# Patient Record
Sex: Male | Born: 1945 | Race: White | Hispanic: No | Marital: Married | Smoking: Current every day smoker
Health system: Southern US, Community
[De-identification: ages and names within clinical notes are randomized; demographics above are authoritative.]

## PROBLEM LIST (undated history)

## (undated) DIAGNOSIS — I714 Abdominal aortic aneurysm, without rupture, unspecified: Secondary | ICD-10-CM

## (undated) DIAGNOSIS — I639 Cerebral infarction, unspecified: Secondary | ICD-10-CM

## (undated) DIAGNOSIS — R0602 Shortness of breath: Secondary | ICD-10-CM

## (undated) DIAGNOSIS — I10 Essential (primary) hypertension: Secondary | ICD-10-CM

## (undated) DIAGNOSIS — N183 Chronic kidney disease, stage 3 unspecified: Secondary | ICD-10-CM

## (undated) DIAGNOSIS — I471 Supraventricular tachycardia, unspecified: Secondary | ICD-10-CM

## (undated) DIAGNOSIS — K5792 Diverticulitis of intestine, part unspecified, without perforation or abscess without bleeding: Secondary | ICD-10-CM

## (undated) DIAGNOSIS — J449 Chronic obstructive pulmonary disease, unspecified: Secondary | ICD-10-CM

## (undated) DIAGNOSIS — I4891 Unspecified atrial fibrillation: Secondary | ICD-10-CM

## (undated) DIAGNOSIS — I6529 Occlusion and stenosis of unspecified carotid artery: Secondary | ICD-10-CM

## (undated) HISTORY — DX: Abdominal aortic aneurysm, without rupture: I71.4

## (undated) HISTORY — DX: Occlusion and stenosis of unspecified carotid artery: I65.29

## (undated) HISTORY — DX: Essential (primary) hypertension: I10

## (undated) HISTORY — DX: Chronic kidney disease, stage 3 unspecified: N18.30

## (undated) HISTORY — DX: Abdominal aortic aneurysm, without rupture, unspecified: I71.40

## (undated) HISTORY — DX: Cerebral infarction, unspecified: I63.9

## (undated) HISTORY — PX: TESTICLE TORSION REDUCTION: SHX795

## (undated) HISTORY — DX: Unspecified atrial fibrillation: I48.91

---

## 2005-02-23 DIAGNOSIS — I779 Disorder of arteries and arterioles, unspecified: Secondary | ICD-10-CM

## 2005-02-23 DIAGNOSIS — I6529 Occlusion and stenosis of unspecified carotid artery: Secondary | ICD-10-CM

## 2005-02-23 DIAGNOSIS — I639 Cerebral infarction, unspecified: Secondary | ICD-10-CM

## 2005-02-23 HISTORY — DX: Disorder of arteries and arterioles, unspecified: I77.9

## 2005-02-23 HISTORY — PX: CAROTID ENDARTERECTOMY: SUR193

## 2005-02-23 HISTORY — DX: Occlusion and stenosis of unspecified carotid artery: I65.29

## 2005-02-23 HISTORY — DX: Cerebral infarction, unspecified: I63.9

## 2005-02-23 HISTORY — PX: CAROTID STENT: SHX1301

## 2005-05-24 ENCOUNTER — Encounter: Payer: Self-pay | Admitting: Emergency Medicine

## 2005-05-24 ENCOUNTER — Ambulatory Visit: Payer: Self-pay | Admitting: Pulmonary Disease

## 2005-05-24 ENCOUNTER — Inpatient Hospital Stay (HOSPITAL_COMMUNITY): Admission: AD | Admit: 2005-05-24 | Discharge: 2005-05-29 | Payer: Self-pay | Admitting: Pediatrics

## 2005-05-24 ENCOUNTER — Ambulatory Visit: Payer: Self-pay | Admitting: Physical Medicine & Rehabilitation

## 2005-05-24 DIAGNOSIS — I639 Cerebral infarction, unspecified: Secondary | ICD-10-CM

## 2005-05-24 HISTORY — DX: Cerebral infarction, unspecified: I63.9

## 2005-05-26 ENCOUNTER — Encounter: Payer: Self-pay | Admitting: Cardiology

## 2005-05-26 ENCOUNTER — Ambulatory Visit: Payer: Self-pay | Admitting: Cardiology

## 2005-05-29 ENCOUNTER — Inpatient Hospital Stay (HOSPITAL_COMMUNITY)
Admission: RE | Admit: 2005-05-29 | Discharge: 2005-06-03 | Payer: Self-pay | Admitting: Physical Medicine & Rehabilitation

## 2005-06-05 ENCOUNTER — Encounter (HOSPITAL_COMMUNITY)
Admission: RE | Admit: 2005-06-05 | Discharge: 2005-07-05 | Payer: Self-pay | Admitting: Physical Medicine & Rehabilitation

## 2005-07-06 ENCOUNTER — Encounter: Payer: Self-pay | Admitting: Emergency Medicine

## 2005-07-06 ENCOUNTER — Inpatient Hospital Stay (HOSPITAL_COMMUNITY): Admission: EM | Admit: 2005-07-06 | Discharge: 2005-07-09 | Payer: Self-pay | Admitting: Pediatrics

## 2005-07-06 ENCOUNTER — Ambulatory Visit: Payer: Self-pay | Admitting: Internal Medicine

## 2005-07-09 ENCOUNTER — Encounter
Admission: RE | Admit: 2005-07-09 | Discharge: 2005-10-07 | Payer: Self-pay | Admitting: Physical Medicine & Rehabilitation

## 2005-07-09 ENCOUNTER — Ambulatory Visit: Payer: Self-pay | Admitting: Physical Medicine & Rehabilitation

## 2005-07-15 ENCOUNTER — Encounter (HOSPITAL_COMMUNITY)
Admission: RE | Admit: 2005-07-15 | Discharge: 2005-08-14 | Payer: Self-pay | Admitting: Physical Medicine & Rehabilitation

## 2005-08-27 ENCOUNTER — Emergency Department (HOSPITAL_COMMUNITY): Admission: EM | Admit: 2005-08-27 | Discharge: 2005-08-27 | Payer: Self-pay | Admitting: Emergency Medicine

## 2005-11-23 ENCOUNTER — Ambulatory Visit: Payer: Self-pay | Admitting: Gastroenterology

## 2005-11-23 ENCOUNTER — Ambulatory Visit (HOSPITAL_COMMUNITY): Admission: RE | Admit: 2005-11-23 | Discharge: 2005-11-23 | Payer: Self-pay | Admitting: Gastroenterology

## 2005-11-23 ENCOUNTER — Encounter (INDEPENDENT_AMBULATORY_CARE_PROVIDER_SITE_OTHER): Payer: Self-pay | Admitting: *Deleted

## 2008-01-09 ENCOUNTER — Ambulatory Visit (HOSPITAL_COMMUNITY): Admission: RE | Admit: 2008-01-09 | Discharge: 2008-01-09 | Payer: Self-pay | Admitting: Family Medicine

## 2008-09-11 ENCOUNTER — Inpatient Hospital Stay (HOSPITAL_COMMUNITY): Admission: AD | Admit: 2008-09-11 | Discharge: 2008-09-13 | Payer: Self-pay | Admitting: Family Medicine

## 2008-09-11 ENCOUNTER — Ambulatory Visit (HOSPITAL_COMMUNITY): Admission: RE | Admit: 2008-09-11 | Discharge: 2008-09-11 | Payer: Self-pay | Admitting: Family Medicine

## 2008-10-10 ENCOUNTER — Ambulatory Visit: Payer: Self-pay | Admitting: Vascular Surgery

## 2009-04-03 ENCOUNTER — Ambulatory Visit: Payer: Self-pay | Admitting: Vascular Surgery

## 2009-09-18 ENCOUNTER — Ambulatory Visit: Payer: Self-pay | Admitting: Vascular Surgery

## 2010-03-20 ENCOUNTER — Ambulatory Visit
Admission: RE | Admit: 2010-03-20 | Discharge: 2010-03-20 | Payer: Self-pay | Source: Home / Self Care | Attending: Vascular Surgery | Admitting: Vascular Surgery

## 2010-03-21 NOTE — Procedures (Unsigned)
DUPLEX ULTRASOUND OF ABDOMINAL AORTA  INDICATION:  Follow up known AAA.  HISTORY: Diabetes:  No. Cardiac:  No. Hypertension:  No. Smoking:  Yes. Connective Tissue Disorder: Family History:  Unknown. Previous Surgery:  No.  DUPLEX EXAM:         AP (cm)                   TRANSVERSE (cm) Proximal             2.73 cm                   2.90 cm Mid                  3.84 cm                   3.99 cm Distal               4.30 cm                   4.51 cm Right Iliac          0.95 cm                   1.01 cm Left Iliac           1.05 cm                   1.12 cm  PREVIOUS:  Date: 09/18/09  AP:  4.4  TRANSVERSE:  4.6  IMPRESSION: 1. Abdominal aortic aneurysm noted with largest measurement of 4.30 X     4.51 cm. 2. Somewhat limited visualization due to bowel gas and patient body     habitus. 3. Study appears stable when compared with previous examination.  ___________________________________________ Janetta Hora Fields, MD  EM/MEDQ  D:  03/20/2010  T:  03/20/2010  Job:  [id number]

## 2010-06-01 LAB — DIFFERENTIAL
Basophils Absolute: 0 10*3/uL (ref 0.0–0.1)
Basophils Relative: 0 % (ref 0–1)
Eosinophils Absolute: 0.2 10*3/uL (ref 0.0–0.7)
Eosinophils Relative: 1 % (ref 0–5)
Lymphocytes Relative: 14 % (ref 12–46)
Lymphs Abs: 1.7 10*3/uL (ref 0.7–4.0)
Monocytes Absolute: 1 10*3/uL (ref 0.1–1.0)
Monocytes Relative: 8 % (ref 3–12)
Neutro Abs: 9.3 10*3/uL — ABNORMAL HIGH (ref 1.7–7.7)
Neutrophils Relative %: 76 % (ref 43–77)

## 2010-06-01 LAB — CBC
HCT: 45.9 % (ref 39.0–52.0)
Hemoglobin: 16.1 g/dL (ref 13.0–17.0)
MCHC: 35 g/dL (ref 30.0–36.0)
MCV: 95.9 fL (ref 78.0–100.0)
Platelets: 307 10*3/uL (ref 150–400)
RBC: 4.79 MIL/uL (ref 4.22–5.81)
RDW: 12.9 % (ref 11.5–15.5)
WBC: 12.3 10*3/uL — ABNORMAL HIGH (ref 4.0–10.5)

## 2010-06-01 LAB — BASIC METABOLIC PANEL
BUN: 11 mg/dL (ref 6–23)
CO2: 25 mEq/L (ref 19–32)
Calcium: 8.7 mg/dL (ref 8.4–10.5)
Chloride: 105 mEq/L (ref 96–112)
Creatinine, Ser: 0.97 mg/dL (ref 0.4–1.5)
GFR calc Af Amer: >60 mL/min (ref >60)
GFR calc non Af Amer: >60 mL/min (ref >60)
Glucose, Bld: 117 mg/dL — ABNORMAL HIGH (ref 70–99)
Potassium: 3.7 mEq/L (ref 3.5–5.1)
Sodium: 137 mEq/L (ref 135–145)

## 2010-07-08 NOTE — Assessment & Plan Note (Signed)
OFFICE VISIT   Blake Cherry  DOB:  March 29, 1945                                       10/10/2008  RUEAV#:[id number]   Blake Cherry is a 65 year old male referred by Dr. Gerda Cherry for  evaluation of abdominal aortic aneurysm.  The patient recently had a  bout of left-sided colon diverticulitis and on CT scan was incidentally  found to have an abdominal aortic aneurysm.  This was approximately 4 cm  in diameter.  He recovered from his diverticulitis with antibiotic  therapy alone.  He currently denies any abdominal or back pain.  He  denies any family history of aneurysms.  His atherosclerotic risk  factors include smoking.  He denies history of diabetes, hypertension,  coronary artery disease, or elevated cholesterol.  He did have a stroke  in 2007 and had a left internal carotid artery stent placed at that  time.  He does have some mild residual left-sided weakness.  He also has  some dyscoordination of the left hand and leg.   PAST SURGICAL HISTORY:  None.   MEDICATIONS:  Aspirin 325 mg once a day, proglitazone versus placebo  high-risk drug trial 45 mg once a day, Centrum vitamin once a day, Fish  Oil 1000 mg 2 times a day.   ALLERGIES:  He has no known drug allergies.   FAMILY HISTORY:  Unremarkable.   SOCIAL HISTORY:  He is married and has 1 child.  Works as a Health and safety inspector.  He currently smokes approximately 1/2-pack  of cigarettes per day.  He does not consume alcohol regularly.   REVIEW OF SYSTEMS:  He is 5 feet 8, 195 pounds.  CARDIAC:  He has some dyspnea with exertion.  GI:  He still has some occasional diarrhea and constipation.  VASCULAR:  Stroke history as mentioned above.  PULMONARY, RENAL, NEUROLOGIC, ORTHOPEDIC, PSYCHIATRIC, ENT, and  HEMATOLOGIC review of systems are otherwise negative.   PHYSICAL EXAM:  Blood pressure is 150/85 in the left arm, pulse is 96  and regular.  HEENT is unremarkable.  Neck has 2+  carotid pulses without  bruit.  Chest:  Clear to auscultation.  Cardiac exam is regular rate  rhythm without murmur.  Abdomen is obese, soft, nontender, nondistended.  No masses.  Extremities:  He has 2+ radial, femoral, and popliteal  pulses bilaterally.  He has a 2+ left dorsalis pedis pulse.  He has  absent pedal pulses in the right foot.  He has no edema in the legs.  Feet are pink, warm, and well perfused.  Neurologic exam shows that the  left upper extremity is slightly weaker than the right.  He also has  some decreased sensation in the left upper extremity.   I reviewed his CT scan of the abdomen and pelvis from [facility] dated September 11, 2008.  This shows an infrarenal 4-cm abdominal  aortic aneurysm with no evidence of rupture.  His left and right common  iliac arteries are slightly ectatic but normal in size.   I had a lengthy discussion with Blake Cherry today regarding smoking  cessation.  I also described to him several techniques for considering  quitting and the benefits to this.  Greater than 5 minutes were spent on  this today.  I also had a lengthy discussion with him today on  the  anatomic findings of his abdominal aortic aneurysm as well as the long-  term history of small aortic aneurysms and risk of rupture of aneurysms  as they grow in size.  I believe the best option for right now is  continued surveillance until the aneurysm reaches around 5.5 cm in  diameter.  We will schedule him for a repeat ultrasound in 6 months'  time.  He will return for follow up sooner if he has any symptoms of  back or abdominal pain.  The risk of rupture at his current size is less  than 1% per year.  This was explained to the patient.   Blake Hora. Fields, MD  Electronically Signed   CEF/MEDQ  D:  10/11/2008  T:  10/11/2008  Job:  [id number]   cc:   Blake Picket A. Gerda Diss, MD

## 2010-07-08 NOTE — Discharge Summary (Signed)
NAME:  Blake Cherry, Blake Cherry NO.:  [id number]   MEDICAL RECORD NO.:  [id number]          PATIENT TYPE:  INP   LOCATION:  A310                          FACILITY:  [facility]   PHYSICIAN:  Donna Bernard, M.D.DATE OF BIRTH:  16-Apr-1945   DATE OF ADMISSION:  09/11/2008  DATE OF DISCHARGE:  07/22/2010LH                               DISCHARGE SUMMARY   FINAL DIAGNOSES:  1. Acute diverticulitis.  2. Aortic aneurysm, 4.1 cm.   FINAL DISPOSITION:  1. The patient discharged to home.  2. Low-residue, full liquid diet, advancing to solid over the next      several days.  3. Cipro 500 b.i.d. for 7 days.  4. Flagyl 500 t.i.d. for 7 days.  5. MiraLax one scoop daily, long term as needed for constipation.  6. Follow up in the office next Wednesday.  7. We will work on setting up consultation with vascular surgeons.   INITIAL H AND P:  Please see H and P as dictated.   HOSPITAL COURSE:  This patient is a 65 year old white male with a fairly  benign prior medical history, who presented the day of admission with  severe pain, left lower quadrant tenderness, and had elevated white  blood count.  CT scan revealed evidence of diverticulitis.  The patient  was admitted to hospital, started on appropriate IV antibiotics and IV  fluids.  He was also given Dilaudid for pain.  Zofran as needed for  nausea.  Over next 48 hours, he improved markedly.  Today, day of  discharge, he has good bowel sounds and only mild tenderness in his  abdomen.  Of note, during the scan of his abdomen, an abdominal aneurysm  was discovered at 4.1 cm, this was felt to be asymptomatic.  At this  time, we are going to set up a vascular surgeon consult in this regard.  Today, the patient is stable and discharged home with diagnosis and  disposition as noted above.      Donna Bernard, M.D.  Electronically Signed     WSL/MEDQ  D:  09/13/2008  T:  09/13/2008  Job:  [id number]

## 2010-07-08 NOTE — H&P (Signed)
NAME:  Blake Cherry, Blake Cherry NO.:  [id number]   MEDICAL RECORD NO.:  [id number]          PATIENT TYPE:  INP   LOCATION:  A310                          FACILITY:  [facility]   PHYSICIAN:  Scott A. Gerda Diss, MD    DATE OF BIRTH:  03-24-1945   DATE OF ADMISSION:  09/11/2008  DATE OF DISCHARGE:  LH                              HISTORY & PHYSICAL   CHIEF COMPLAINT:  Abdominal pain and discomfort.   HISTORY OF PRESENT ILLNESS:  This 65 year old gentleman presents with  abdominal pain and discomfort for the past 4 to 5 days.  It has been  progressively worse.  He states that he has found it very difficult to  have a bowel movement.  He feels like he needs to go, but he cannot.  He  notices his abdomen is swelling more and he is having pain.  He did have  a little bit of fever over the weekend and some chills.  Denies any  diarrhea.  Relates some nausea.  On examination in the office, he had  tenderness, so we sent him for a CAT scan and lab work.   PAST MEDICAL HISTORY:  He had a stroke in 2007.  He has been counseled  to quit smoking, but he keeps smoking.  He had a colonoscopy in 2007  that did show some diverticula, and he does have a family history of  hypertension, diabetes.   MEDICATIONS:  Aspirin.  Multivitamin.  Fish oil.   ALLERGIES:  None.   REVIEW OF SYSTEMS:  Negative for headaches, cough, shortness of breath,  chest pressure or pain, hematuria, or rectal bleeding.   EXAM:  HEENT:  TMs and LT.  NECK:  No mass.  CHEST:  CTA.  No crackles.  HEART:  Regular.  ABDOMEN:  Soft with mild bloating.  Tenderness in the left lower  quadrant with no true guarding.  EXTREMITIES:  No edema.   DIAGNOSTIC STUDIES:  CBC with 14,000 white count, left shift.  MET7  overall looks good.  CT scan shows diverticulitis.  No abscess.  Also  shows infrarenal aortic aneurysm of 4 cm which I talked with him about.   ASSESSMENT AND PLAN:  1. Aneurysm.  This is not the source of his pain.   It is not leaking.      I have told him we will set him up with vascular surgery in      [facility] for a consultation after he gets out of the hospital.  2. Diverticulitis.  Because of elevated white count, inability to have      bowel movement, increased gas, and nausea, I do feel this patient      needs to be an inpatient.  We will go ahead and treat him as an      inpatient.  IV Levaquin along with Flagyl, monitor blood counts,      and when his white count comes down as well as his pain starts      improving and his bowels start moving better, then he will probably      be able  to go home.  He will probably be in the hospital anywhere      from 2 to 4 days.  Certainly if it gets worse, we may have to do      surgical consultation, but currently not necessary.      Scott A. Gerda Diss, MD  Electronically Signed     SAL/MEDQ  D:  09/11/2008  T:  09/11/2008  Job:  [id number]

## 2010-07-08 NOTE — Procedures (Signed)
DUPLEX ULTRASOUND OF ABDOMINAL AORTA   INDICATION:  Followup known abdominal aortic aneurysm.   HISTORY:  Diabetes:  No.  Cardiac:  No.  Hypertension:  No.  Smoking:  Yes.  Connective Tissue Disorder:  Family History:  Unknown.  Previous Surgery:  No.   DUPLEX EXAM:         AP (cm)                   TRANSVERSE (cm)  Proximal             2.3 cm                    2.1 cm  Mid                  2.8 cm                    2.6 cm  Distal               4.4 cm                    4.6 cm  Right Iliac          Not visualized            Not visualized  Left Iliac           cm                        1.6 cm   PREVIOUS:  Date:  AP:  4.2  TRANSVERSE:  4.6   IMPRESSION:  1. Abdominal aortic aneurysm with the largest diameter of 4.4 x 4.6      cm.  2. Doppler velocities in the aorta and left common iliac artery appear      to be normal.  3. Right common iliac artery not visualized.  4. Somewhat limited visualization due to patient body habitus and      bowel gas patterns.  5. No significant change from previous exam.   ___________________________________________  Janetta Hora. Fields, MD   NT/MEDQ  D:  09/18/2009  T:  09/18/2009  Job:  [id number]

## 2010-07-08 NOTE — Procedures (Signed)
DUPLEX ULTRASOUND OF ABDOMINAL AORTA   INDICATION:  Followup of abdominal aortic aneurysm.   HISTORY:  Diabetes:  No.  Cardiac:  No.  Hypertension:  No.  Smoking:  Yes.  Connective Tissue Disorder:  Family History:  Unknown.  Previous Surgery:  No.   DUPLEX EXAM:         AP (cm)                   TRANSVERSE (cm)  Proximal             2.42 cm                   2.15 cm  Mid                  4.2 cm                    4.67 cm  Distal               4.29 cm                   3.93 cm  Right Iliac          1.37 cm                   2.12 cm  Left Iliac           1.65 cm                   2.12 cm   PREVIOUS:  Date:  10/12/2008  AP:  4  TRANSVERSE:   IMPRESSION:  1. Abdominal aortic aneurysm with largest measurement 4.2 x 4.67 cm.  2. Poor visualization due to body habitus and bowel gas pattern.   ___________________________________________  Janetta Hora. Fields, MD   CJ/MEDQ  D:  04/03/2009  T:  04/03/2009  Job:  [id number]

## 2010-07-11 NOTE — H&P (Signed)
NAME:  Blake Cherry, Blake Cherry NO.:  [id number]   MEDICAL RECORD NO.:  [id number]          PATIENT TYPE:  INP   LOCATION:  3005                         FACILITY:  [facility]   PHYSICIAN:  Deanna Artis. Hickling, M.D.DATE OF BIRTH:  02/18/1946   DATE OF ADMISSION:  07/06/2005  DATE OF DISCHARGE:                                HISTORY & PHYSICAL   CHIEF COMPLAINT:  Vertigo.   HISTORY OF PRESENT ILLNESS:  A 65 year old left handed married Caucasian  gentleman.  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  This occurred from an  occluded right internal carotid artery that was revascularized.   The patient has done extremely well nearly returning to baseline.  This  morning, the patient had sudden onset of vertigo clockwise in rotation with  vomiting.  He did not fall.  This lasted for about 4 hours and then  disappeared.  The patient was seen at [facility].  CT scan of the  brain showed the remote infarction in the right anterior cerebral artery  distribution. MRI, however, showed a new infarction lateral and immediately  adjacent to the prior stroke.  There was evidence of a petechial hemorrhage  throughout the old stroke.  There was no evidence of brain stem infarction.  The vessel signal void in the circle of Willis looked to be fine.  The  patient was transferred from [facility] for further evaluation of  what appeared to be a new stroke.   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  There appeared to be embolized clot in the superior and inferior branches of  the middle cerebral artery, also the angular artery and the posterior  parietal arteries.  None of these areas actually turned out to have a stroke  in them.   The patient had a stent placed at the carotid and had evidence of  hemorrhagic transformation seen on CT scan as well as a small amount of  subarachnoid hemorrhage.  2-D echocardiogram was negative for clot and  showed normal ejection fraction.  Transcranial and carotid Dopplers were not  done.  Serum homocysteine was elevated at 17.  Hemoglobin A1c was normal.  Cholesterol and triglycerides were normal.  HDL low at 25, LDL low but  normal at 65.  The patient was sent home on aspirin, Plavix was added due to  his stent.  He also was given Protonix, Colace and Nicoderm patch for  smoking cessation.  The patient had a mild residual left hemiparesis 4/5,  decreased fine motor movements, decreased foot tap and a left central  seventh on discharge.  This has all improved.  The patient was in rehab and  was working on balancing on his left leg and also tandem gait.   PAST MEDICAL HISTORY:  The patient had hypertension during his initial  stroke event.  This has resolved.  The patient had never sought care  from  medical doctor prior to this hospitalization.   PAST SURGICAL HISTORY:  None.   FAMILY HISTORY:  Father had died of a stroke at age 2.  Mother died of a  stroke at age 4.  The patient has two brothers with diabetes mellitus and  kidney disease.  Sister with heart disease.   SOCIAL HISTORY:  The patient quit smoking April;  two pack per day smoker.  He does not use alcohol.  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  He is married and has one son.   EXAMINATION:  Temperature 98, pulse 89, respirations 20, blood pressure  154/85.  His weight is estimated at 195 pounds.  Oxygen saturation 94% room  air.  HEENT:  No signs of infection.  No bruits.  LUNGS:  Clear.  HEART:  No murmurs.  PULSES:  Normal.  ABDOMEN:  Soft.  Bowel sounds normal.  No hepatosplenomegaly.  EXTREMITIES:  Negative.  NEUROLOGIC:  Examination showed mental status  alert without dysphasia.  Cranial nerves:  Round, reactive pupils.  Visual fields full.  Extraocular  movements full.  Symmetric facial strength.  Midline tongue.  Air conduction  greater than bone conduction bilaterally.  Motor examination:  Normal  strength.  No drift.  Fine motor movements, right better than left.  Left is  a little clumsy.  He does not show satellite behavior with rotating his  hands about each other.  He taps his toe better on the right than left.  Sensation:  No hemisensory.  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  Cerebellar:  Finger-to-nose, heel-knee-shin was okay.  Gait:  Broad based.  He had difficulty with tandem.  Deep tendon reflexes were diminished.  The  patient had bilateral flexor plantar responses.   IMPRESSION:  Recurrent anterior cerebral artery infarction.  The vertigo is  seemingly not related but I wonder if the patient did not have an embolic  event that went both to the interior cerebral artery distribution and the  posterior circulation.   We see no evidence of a brain stem infarction.   At present, I plan no change in medications including his Nicoderm.  We will  check carotid Doppler, transcranial Doppler, MRA.  MRI was already done and  was reported upon.  I have reviewed it as well as the CT.  We will recheck  the homocysteine because it was the only one of the medicines that was  increased.  The patient is able to go home when the workup is complete.      Deanna Artis. Sharene Skeans, M.D.  Electronically Signed     WHH/MEDQ  D:  07/06/2005  T:  07/07/2005  Job:  [id number]   cc:   Lorin Picket A. Gerda Diss, MD  Fax: [phone]

## 2010-07-11 NOTE — Discharge Summary (Signed)
NAME:  Blake Cherry, Blake Cherry NO.:  [id number]   MEDICAL RECORD NO.:  [id number]          PATIENT TYPE:  INP   LOCATION:  3006                         FACILITY:  [facility]   PHYSICIAN:  Pramod P. Pearlean Brownie, MD    DATE OF BIRTH:  [birth year]   DATE OF ADMISSION:  05/24/2005  DATE OF DISCHARGE:  05/29/2005                                 DISCHARGE SUMMARY   DISCHARGE DIAGNOSES:  1.  Right anterior communicating artery infarct, status post two thirds      intravenous TPA stroke secondary to embolization from right internal      carotid artery occlusion, status post right internal carotid artery      angioplasty and stent.  2.  Hypertension.  3.  Tobacco abuse.  4.  Hyperhomocysteinemia.  5.  Pulmonary nodules per chest x-ray.  No pulmonary nodules on chest CT,      some__________  adenopathy, needs followup in four to six months.   DISCHARGE MEDICATIONS:  1.  Nicotine patch 21 mg daily.  2.  Aspirin 81 mg a day.  3.  Plavix 75 mg a day.  4.  Protonix 40 mg a day.  5.  Colace 100 mg b.i.d.   STUDIES PERFORMED:  1.  Chest x-ray shows cardiomegaly without edema or infiltrate, a 5 x 7      right mid lung nodule, not definitely calcified.  2.  CT of the chest shows borderline enlarged__________  lymph node.  No      suspicious pulmonary nodules or masses.  Dependent changes at the right      lung base.  3.  CT of the head, on admission, shows no acute abnormality, sphenoid and      ethmoid sinusitis.  4.  CT of the head, status post angioplasty, shows no interval change in CT      findings.  No new intracranial hemorrhage, chronic sinusitis.  5.  CT of the head, later that day again, shows no new changes.  6.  Cerebral angiogram shows occluded right internal carotid artery at the      bulb with partial distal reconstitution of the cavernous and__________      cavernous regions from the external carotid artery branches.  Filling      defects in the superior and inferior division  of the right middle      cerebral artery representing embolized clot with angiographic occlusion      of the callosal margin of the pericallosal margin on the right side.  7.  Revascularization with stent-assisted angioplasty of symptomatic right      internal carotid artery occlusion improved flow and clearance of filling      defects in the proximal branches of the anterior and middle cerebral      arteries, residual obstruction of the callosal marginal branch at its      middle third and also the angular branch of the right MCA and the      posterior parietal branch of the right MCA.  8.  Chest x-ray, after angioplasty, showed support tubes and intubation.  Again nodular opacity overlying the right lower lung was followed up as      above.  9.  CT of the head, on May 25, 2005, shows evolving medial right frontal      lobe infarct, new subarachnoid hemorrhage within the right fontal lobe,      stable scattered white matter hyper densities within the right frontal      lobe and right parietal lobe.  10. CT of the head, on May 26, 2005, shows stable right frontal infarct      with right frontal subarachnoid hemorrhage.  No new changes.  11. CT of the head, on May 27, 2005, shows evolutionary changes of right      frontal infarct with stable right frontal hemorrhage, no new changes.  12. A 2D echocardiogram shows overall left ventricular systolic function was      normal.  Inadequate to evaluate left ventricular regional wall motion.      No embolic source.  13. EKG shows normal sinus rhythm, no old tracings to compare.  14. Carotid Doppler study, not performed.  15. Transcranial Doppler, not performed.   LABORATORY STUDIES:  CBC in emergency room showed white blood cells of 12.1,  otherwise CBC normal.  Differential normal.  Chemistry with glucose 118,  calcium 7.6, total protein 5.3, albumin 2.9.  The rest of the chemistry was  normal.  Liver function tests were normal.   Hemoglobin A1c was 5.4.  Homocystine was elevated at 17.0.  Cholesterol was 116, triglycerides 130,  HDL 25, and LDL 65.  Urinalysis shows 0-2 white blood cells, 3-6 red blood  cells, and no protein, and rare bacteria.   HISTORY OF PRESENT ILLNESS:  [name] is a 65 year old, left  handed, Caucasian male who had sudden onset of left sided clumsiness around  1700 the day of admission.  He arrived at [facility] emergency room  and a call was placed to Dr. Sharene Skeans at [facility] around 1745.  IV TPA was started at [facility] at 1827.  The patient received 8.6  loading dose and approximately 1/2 the 77.3 mg infusion. The infusion was  stopped in the setting of increased right pupil size and increased left  sided weakness.  The patient was transported STAT to [facility] Neuro ICU.  The patient was sent to the angiogram suite where he was seen by Dr. Kerby Nora, where he was found to have a proximal right ICA occlusion that  was completely opened with angioplasty and stent placement.  There was an M2  filling deficit not seen proximally with likely clot that dissolved.  He had  an occluded angular branch noted.  He was sent to the neuro ICU for further  care intubated.   HOSPITAL COURSE:  MRI showed a right ACA infarct that was thought to be  secondary to embolization from right ICA occlusion.  Critical care medicine  was consulted for vent management.  Chest x-ray did show a pulmonary nodule  with followup CT not showing pulmonary nodule but did find a enlarged lymph  node.  The patient was advised to stop smoking and plans were for followup  CT of the chest in four to six months.  Otherwise the patient was able to  wean successfully.  Post CT after angiogram showed punctate hemorrhage and  subarachnoid hemorrhage that were present but not severe.  The patient was  placed on aspirin and Plavix for secondary stroke prevention and secondary  to stent  placement.  The patient was carefully monitored over the next three  to four days with daily CT scans of the head which remained stable.  PT, OT,  and speech therapy evaluated the him and thought he would benefit from  comprehensive inpatient rehab.  Insurance approval was obtained and the  patient was transferred there for continued care.   CONDITION ON DISCHARGE:  The patient is alert and oriented x3.  No cranial  nerve palsies.  Left hemiparesis 4/5 with decreased fine motor movement on  the left and decreased foot tap on the left.  His extraocular movements were  intact.  He did have some left facial weakness.  He had no aphasia or  dysarthria.   DISCHARGE PLAN:  1.  Transfer to rehab for continuation of PT, OT, and speech therapy.  2.  Aspirin and Plavix for secondary stroke prevention, likely consider      stopping one of these medicines in two months.  3.  The patient advised to stop smoking.  4.  New Foltx for elevated homocystine and need to continue lifelong unless      the patient stops smoking and then needs re-evaluated.  5.  Followup CT of the chest in four to six months with pulmonary critical      care medicine.  6.  Follow up with Dr. Lajuana Matte in two to three months.  7.  Follow up with primary care physician one moth after discharge.      Annie Main, N.P.    ______________________________  Sunny Schlein. Pearlean Brownie, MD    SB/MEDQ  D:  05/29/2005  T:  05/30/2005  Job:  [id number]   cc:   Pramod P. Pearlean Brownie, MD  Fax: [phone]   Scott A. Gerda Diss, MD  Fax: [phone]

## 2010-07-11 NOTE — Discharge Summary (Signed)
NAME:  Blake Cherry, KUHL NO.:  [id number]   MEDICAL RECORD NO.:  [id number]          PATIENT TYPE:  INP   LOCATION:  3005                         FACILITY:  [facility]   PHYSICIAN:  Pramod P. Pearlean Brownie, MD    DATE OF BIRTH:  [birth year]   DATE OF ADMISSION:  07/07/2005  DATE OF DISCHARGE:  07/09/2005                                 DISCHARGE SUMMARY   DIAGNOSES AT TIME OF DISCHARGE:  1.  Brainstem transient ischemic attack with extension of recent right      middle cerebral artery infarction.  Right anterior cerebral artery      infarction with petechial hemorrhagic transformation on May 29, 2005.  2.  Hypertension, resolved after April 2000 hospitalization.  3.  Tachycardia, etiology unclear, now resolved.  4.  Smoker.   MEDICINES AT THE TIME OF DISCHARGE:  1.  Plavix 75 mg daily.  2.  Aspirin 325 mg daily.  3.  Vitamin B 650 mg daily.  4.  Folic acid 1 mg daily.  5.  Nicotine patch 14 mg daily.   STUDIES PERFORMED:  1.  CT of the head on admission showed old ischemic right frontal infarction      with expected evolutionary changes.  No acute changes.  2.  MRI of the brain shows a relatively receive new right anterior cerebral      artery ischemia lateral to the area of subacute infarction, treated with      endovascular technique on May 24, 2005.  Minimal T1 shortening on      sagittal images could suggest mild micro-hemorrhage, atrophy with      chronic microvascular ischemic change.  3.  MRA of the head shows right internal carotid artery __________.  The      cavernous and supraclinoid segments are normal.  Mild stenosis in the      right M1 segment.  MCA bifurcation branches are patent.  Mild narrowing,      left M1 segment.  Suggestion of occlusion of the right pericallosal      branch of the anterior cerebral artery.  Probable moderate      arteriosclerotic narrowing of both MCAs proximally.  4.  TEE performed on Jul 08, 2005 with no clot, no PSO.  5.   Carotid Doppler shows no ICA stenosis.  6.  Electrocardiogram shows sinus tachycardia.  No ischemic changes.   LABORATORY STUDIES:  Hemoglobin 17.1, hematocrit 48.3, white blood cells  13.4, platelets 283, MCV 93.4, neutrophils 92.  Cardiac enzymes negative.  Chemistry with glucose of 117; otherwise normal.  Urinalysis with 0-2 red  blood cells; otherwise normal.  Homocystine 12.1.  Coagulation studies on  admission normal.   HISTORY OF PRESENT ILLNESS:  [name] is a 65 year old left-handed  Caucasian male with a stroke admission May 24, 2005 through May 29, 2005,  followed by rehabilitation May 29, 2005 through June 02, 2005 for a right  anterior cerebral distribution infarction with petechial hemorrhagic  transformation.  This occurred from an occluded right internal carotid  artery that was revascularized.  The patient has done well since  discharge,  nearly returning to baseline.  The morning of admission, he had the sudden  onset of, clockwise in rotation, with vomiting.  He did not fall.  It lasted  for about 4 hours then disappeared.  He was seen at [facility] where CT of  the brain showed the remote infarction in the right anterior cerebral artery  distribution.  MRI, however, did show a new infarction, and immediately  adjacent to the prior stroke.  There was evidence of petechial hemorrhage  throughout the old stroke.  There was no evidence of brainstem infarction.  The vessel signal void and the circle of Willis looked to be fine.  The  patient was transferred from [facility] for further evaluation of what  appeared to be a new stroke.   HOSPITAL COURSE:  A cardioembolic source was suspected, and a  transesophageal echocardiogram was ordered, as it was not done in his prior  admission.  The TEE was negative for cardioembolic source with no PSO and  negative bubble study.  It is felt he probably had a brainstem TIA in the  setting of a small acute infarction in  the adjacent stroke, likely due to  alteration in collaterals for surrounding the current stroke.  Recommendations were to keep the patient on aspirin and Plavix secondary to  recent stent and to keep him well hydrated.  He will follow up with Dr.  Pearlean Brownie in the office for a bubble study and emboli monitoring.  He is stable  for discharge home.  Of note, the patient did have an elevated heart rate in  the hospital when heart rate in the 130s to 140s with maintenance fair.  It  was thought he may be anxiety secondary to the procedure, and Ativan was  given.  He did not have immediate decrease in his heart rate, so follow up  cardiac labs and electrocardiogram were performed, which were both  unremarkable.  His heart rate did return to baseline prior to discharge.  The etiology of the tachycardia was unclear.   CONDITION AT DISCHARGE:  Alert and oriented x3.  Speech clear.  No aphasia.  His visual fields are full.  His face is symmetric.  He had no arm drift.  His finger-nose-finger was okay bilaterally.  His finger tap was equal.  He  can raise both legs up off the bed without difficulty.  His chest was clear  to auscultation.  His heart rate was regular.  His gait was steady.   DISCHARGE PLAN:  1.  Discharge to home.  2.  Aspirin and Plavix for secondary stroke prevention secondary to stent in      April of 2007.   FOLLOW UP:  1.  Follow up with primary care physician within 1 month after discharge.  2.  Follow up with Dr. Pearlean Brownie on July 27, 2005 at 9:30 a.m.      Annie Main, N.P.    ______________________________  Sunny Schlein. Pearlean Brownie, MD    SB/MEDQ  D:  07/09/2005  T:  07/10/2005  Job:  [id number]   cc:   Blake Picket A. Gerda Diss, MD  Fax: [phone]

## 2010-07-11 NOTE — Discharge Summary (Signed)
NAME:  Blake Cherry, Blake Cherry NO.:  [id number]   MEDICAL RECORD NO.:  [id number]          PATIENT TYPE:  IPS   LOCATION:  4025                         FACILITY:  [facility]   PHYSICIAN:  Ellwood Dense, M.D.   DATE OF BIRTH:  1945/02/24   DATE OF ADMISSION:  05/29/2005  DATE OF DISCHARGE:  06/02/2005                                 DISCHARGE SUMMARY   DISCHARGE DIAGNOSES:  1.  Right ACA infarction, small right frontal cortical subarachnoid      hemorrhage with left hemiplegia.  2.  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  3.  Tobacco abuse.   HISTORY OF PRESENT ILLNESS:  This is a 65 year old right-handed white male  admitted April 1 with acute onset of left-sided weakness.  Cranial CT scan  with right ACA infarction.  CT of the chest negative.  Angiogram with right  ICA occlusion status post stent placed April 1 per Dr. Corliss Skains.  Neurology  service Dr. Sharene Skeans.  Patient did receive TPA.  Placed on aspirin, Plavix  therapy for cerebrovascular accident prophylaxis.  Echocardiogram with  normal left ventricular function.  Patient was admitted for a comprehensive  rehabilitation program.   PAST MEDICAL HISTORY:  See discharge diagnoses.  Noted history of tobacco  abuse.  No alcohol.   ALLERGIES:  None.   SOCIAL HISTORY:  Married.  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  Wife and local daughter work day shift.   MEDICATIONS PRIOR TO ADMISSION:  None.   REHABILITATION HOSPITAL COURSE:  Patient was admitted to inpatient  rehabilitation services with therapies initiated on a b.i.d. basis  consisting of physical therapy, occupational therapy, and speech therapy as  well as rehabilitation nursing.  The following issues were addressed during  patient's rehabilitation stay.  Pertaining to Mr. Plagge right ACA  infarction remained medically stable.  He had also undergone stenting April  1 per Dr. Corliss Skains for  findings of right ICA occlusion.  He was essentially  independent with his ambulation.  He was needing some mild handheld  assistance for his balance.  He was a minimal assist to supervision for  attention deficits.  This was worse with functional tasks.  He was modified  independent for activities of daily living.  He remained on aspirin and  Plavix therapy for CVA prophylaxis.  His blood pressures were well  controlled without the use of antihypertensive medications.  He was placed  on a tapering dose of a Nicoderm patch and it was discussed at length full  education on the need for cessation of smoking.  It was questionable if he  would be compliant with these requests.   Latest laboratories showed a sodium 137, potassium 4, BUN 18, creatinine  1.1.  Hemoglobin 15.4, hematocrit 43.9, WBC 9.3, platelets 297,000.  Hemoglobin A1c of 5.4.  Cholesterol 166.   DISCHARGE MEDICATIONS:  1.  Ecotrin 325 mg daily.  2.  Plavix 75 mg daily.  3.  Folic acid 1 mg daily.  4.  Vitamin B6 50 mg daily.  5.  Nicoderm  patch 14 mg daily x3 weeks, then 7 mg x3 weeks, then stop.   He would follow up with Dr. Ellwood Dense at the outpatient rehabilitation  service office as advised, Dr. Pearlean Brownie, neurology services as well as Dr.  Lilyan Punt of [place], medical management.  Outpatient therapies had  been arranged as recommendations per rehabilitation services.      Mariam Dollar, P.A.    ______________________________  Ellwood Dense, M.D.    DA/MEDQ  D:  06/02/2005  T:  06/02/2005  Job:  [id number]   cc:   Deanna Artis. Sharene Skeans, M.D.  Fax: [phone]   Grandville Silos. Corliss Skains, M.D.  Fax: [phone]   Scott A. Gerda Diss, MD  Fax: [phone]

## 2010-07-11 NOTE — H&P (Signed)
NAME:  Blake Cherry, Blake Cherry NO.:  [id number]   MEDICAL RECORD NO.:  [id number]          PATIENT TYPE:  INP   LOCATION:  3006                         FACILITY:  [facility]   PHYSICIAN:  Ellwood Dense, M.D.   DATE OF BIRTH:  1945-07-19   DATE OF ADMISSION:  05/24/2005  DATE OF DISCHARGE:                                HISTORY & PHYSICAL   PRIMARY CARE PHYSICIANS:  Primary care physician is Scott A. Gerda Diss, M.D. in  [place], [place].  Neurologist is Deanna Artis. Sharene Skeans, M.D.,  interventional radiologist is Grandville Silos. Corliss Skains, M.D.   ADMITTING PHYSICIAN:  Ellwood Dense, M.D.   HISTORY OF PRESENT ILLNESS:  [name] is a 65 year old right handed adult  male with an unremarkable past medical history.  He was admitted May 24, 2005 with acute onset of left sided weakness.  His initial cranial CT scan  showed a right anterior cerebral artery infarct with small right frontal  cortical subarachnoid hemorrhage.  A CT scan of the chest was negative.  The  patient underwent angiogram of the internal carotid arteries which showed  right internal carotid artery occlusion and he underwent a stent May 24, 2005 by Dr. Julieanne Cotton. Neurology, specifically Dr. Sharene Skeans, saw the  patient and the patient received TPA after admission.  He was placed on  aspirin and Plavix for stroke prophylaxis. His main risk factor at this  point is a family history in which his mother had a stroke at age 71, but  the patient is also a tobacco abuser.   The patient was evaluated by the rehabilitation physicians and felt to be an  appropriate candidate for inpatient rehabilitation.   REVIEW OF SYSTEMS:  Noncontributory.   PAST MEDICAL HISTORY:  Noncontributory.  Patient reports he has not seen a  physician for at least 25 years.   FAMILY HISTORY:  Positive for coronary artery disease and stroke along with  diabetes mellitus.   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  His wife is at the  bedside.  His local daughter works day shift.  The patient smokes at least 2  packs of cigarettes per day and denies alcohol intake.   FUNCTIONAL HISTORY:  Prior to admission independent.   MEDICATIONS PRIOR TO ADMISSION:  None.   ALLERGIES:  No known drug allergies.   LABORATORY DATA:  Recent labs show hemoglobin of 16.5, hematocrit of 48.2,  platelet count 304,000 and white blood cell count of 12.0.  A recent  homocysteine level was 17.  Recent sodium was 140, potassium 4.0, chloride  105, cO2 27,BUN 13, and creatinine 1.4.   PHYSICAL EXAMINATION:  GENERAL APPEARANCE:  Reasonably well-appearing,  mildly overweight adult male in no acute discomfort.  VITAL SIGNS:  Blood pressure 142/80 with pulse of 83, respiratory rate 20  and temperature 98.4.  HEENT:  Normocephalic, non-traumatic  CARDIOVASCULAR:  Regular rate and rhythm, S1, S2 without murmurs.  ABDOMEN:  Soft, nontender with positive bowel sounds.  LUNGS:  Clear to auscultation bilaterally.  NEUROLOGICAL:  Alert and oriented x3.  Cranial nerves II-XII are intact.  Bilateral upper extremity examination showed 5/5 strength in the right upper  extremity, 4+/5 strength in the left upper extremity.  Sensation was intact  to light touch throughout the bilateral upper extremities.  Lower extremity  examination on the right shows 5/5 strength throughout.  Bulk and tone were  normal.  Reflexes were 2+ and symmetrical.  Sensation was intact to light  touch in the right lower extremity.  Left lower extremity examination showed  3+ to 4-/5 strength throughout proximally and 4-/5 strength in the left  ankle.  Sensation was intact to light touch throughout the left lower  extremity and reflexes were 1+ in the left lower extremity.   IMPRESSION:  1.  Status post right anterior cerebral artery infarct with small right      frontal cortical  subarachnoid hemorrhage resulting in left hemiparesis,      leg greater than arm.  2.  Cognitive deficits secondary to #1.  3.  Right internal carotid artery occlusion, status post stenting May 24, 2005.  4.  Tobacco abuse.   Presently, the patient has deficits in activities of daily living,  transfers, ambulation and cognition secondary to the above-noted infarct and  subarachnoid hemorrhage.   PLAN:  1.  Admit to the rehabilitation unit for daily occupational therapy,      physical therapy and speech therapies to advance activities of daily      living, transfers, ambulation and cognitive abilities along with speech      evaluation as appropriate.  2.  Check admission labs, including CBC and CMET June 01, 2005.  3.  24 hour nursing for medication administration, monitoring of vital      signs.  4.  Continue Nicoderm patch 21 mcg per hour, change daily.  5.  Continue aspirin 325 mg daily and Plavix 75 mg daily for stroke      prophylaxis.  6.  Continue Protonix 40 mg daily for gastrointestinal prophylaxis.  7.  Side rails up x4 and bed alarm for safety as he appears impulsive.   PROGNOSIS:  Good.   ESTIMATED LENGTH OF STAY:  7 to 12 days.   GOALS:  Modified independent, activities of daily living, transfers,  ambulation and improvement in speech and swallow.           ______________________________  Ellwood Dense, M.D.     DC/MEDQ  D:  05/29/2005  T:  05/29/2005  Job:  [id number]   cc:   Lorin Picket A. Gerda Diss, MD  Fax: [phone]   Grandville Silos. Corliss Skains, M.D.  Fax: [phone]

## 2010-07-11 NOTE — Op Note (Signed)
NAME:  Blake Cherry, Blake Cherry NO.:  [id number]   MEDICAL RECORD NO.:  [id number]          PATIENT TYPE:  AMB   LOCATION:  DAY                           FACILITY:  [facility]   PHYSICIAN:  Kassie Mends, M.D.      DATE OF BIRTH:  [birth year]   DATE OF PROCEDURE:  11/23/2005  DATE OF DISCHARGE:                                  PROCEDURE NOTE   REFERRING PHYSICIAN:  Lilyan Punt, MD   PROCEDURE:  Colonoscopy with snare polypectomy.   INDICATION FOR EXAM:  Blake Cherry is a 65 year old male who presents for  average-risk colon cancer screening.   FINDINGS:  1. Tortuous sigmoid colon, requiring multiple changes of position to      intubate the cecum.  2. A 7-mm flat sigmoid colon polyp removed via snare cautery (200/25).  3. Pancolonic diverticulosis.  Otherwise, no masses, inflammatory changes      or vascular ectasia seen.  4. Normal retroflexed view of the rectum.   RECOMMENDATIONS:  1. If polyp adenomatous, then next screening colonoscopy in 5 years.  2. High-fiber diet.  Handout given on high-fiber diet, polyps and      diverticulosis.  3. Follow up with Dr. Lilyan Punt.   MEDICATIONS:  1. Demerol 100 mg IV.  2. Versed 7 mg IV.   PROCEDURAL TECHNIQUE:  Physical exam was performed and informed consent was  obtained from the patient after explaining the benefits, risks and  alternatives to the procedure.  The patient was connected to the monitor and  placed in the left lateral position.  Continuous oxygen was provided by  nasal cannula and IV medicine administered through the indwelling cannula.  After the administration of sedation and rectal exam, the patient's rectum  was  intubated and scope was advanced under direct visualization to the cecum.  The scope was successfully removed slowly by carefully examining the color,  texture, anatomy and integrity of the mucosa on the way out.  The patient  was recovered in the endoscopy suite and discharged to home in  satisfactory  condition.      Kassie Mends, M.D.  Electronically Signed     SM/MEDQ  D:  11/23/2005  T:  11/24/2005  Job:  [id number]   cc:   Lorin Picket A. Gerda Diss, MD  Fax: [phone]

## 2010-07-11 NOTE — Assessment & Plan Note (Signed)
Blake Cherry returns to clinic today for followup evaluation accompanied by  his wife. The patient is a 65 year old, right-handed, adult male who  experienced acute onset of left-sided weakness May 24, 2005.  Cranial CT  showed an acute infarction of the right anterior cerebral artery.  Chest CT  was negative.  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 Corliss Skains.  The patient was  started on Plavix in addition to aspirin.   The patient eventually stabilized and was moved to the rehabilitation unit  May 29, 2005, remained there through discharge June 02, 2005.   Since discharge, the patient had been attending outpatient therapy at [facility] including PT, OT, and speech.  They were planning on  discharging him in the near future.  He was hospitalized last week with  vertigo and nausea, and he reports that multiple studies did not show any  acute abnormalities.  He reports that Dr. Pearlean Brownie is planning for a bubble  study in the next 2 weeks.  The patient was found to have slight  intracranial hemorrhage on brain scan according to his wife.  I have no  records regarding the initial evaluation.  He continues on aspirin 325 mg  daily along with Plavix at this time.  He continues to smoke 2 to 3  cigarettes per day, but that is down from his prior use.  He is still using  the patch at this point.  His wife continues to smoke.  They have not been  back to see their primary care physician, Dr. Gerda Diss, but they plan to do so  shortly.   MEDICATIONS:  1.  Aspirin 325 mg daily.  2.  Plavix 75 mg daily.  3.  Folic acid 1 mg daily.  4.  Vitamin B6, 50 mg daily.   PHYSICAL EXAMINATION:  GENERAL:  Reasonably well-appearing, middle-aged,  adult male in mild to no acute distress.  VITAL SIGNS:  Blood pressure 153/92, pulse 94, respiratory rate 17, O2  saturation 99% on room air.  NEUROLOGIC:  He has at least 5-/5 strength  throughout bilateral upper and  lower extremities.  Bulk and tone were normal.  Reflexes 2+ and symmetrical.  He has very slight dyscoordination of the left hand compared to the right  that is not noticeable, but he reports it as being evident whenever he plays  the organ.  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  Otherwise, he is independent with  bathing and dressing and independent with ambulation.   IMPRESSION:  1.  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  2.  Tobacco abuse.  3.  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   In the office today, I have recommended the patient continue taking aspirin  at 325 mg daily along with Plavix.  He should be on aspirin at this dose  unless side effects are noticed.  He is due for a bubble study with Dr.  Pearlean Brownie in the next 2 weeks.  I have recommended he follow up with Dr. Gerda Diss.  He had normal hemoglobin A1c and normal cholesterol levels in the office.  I  have encouraged him again to discontinue tobacco use, and his wife is  planning to try to do the same, possibly using Chantix medication.  We will plan on seeing the patient in followup in this office on an as-  needed basis. He will be following up with Dr. Pearlean Brownie and Dr. Gerda Diss.  I  anticipate that he will be discharged from therapy within the next visit or  two.           ______________________________  Ellwood Dense, M.D.     DC/MedQ  D:  07/13/2005 11:30:29  T:  07/13/2005 19:56:27  Job #:  [id number]

## 2010-07-11 NOTE — H&P (Signed)
NAME:  Blake Cherry, Blake Cherry NO.:  [id number]   MEDICAL RECORD NO.:  [id number]          PATIENT TYPE:  INP   LOCATION:  3107                         FACILITY:  [facility]   PHYSICIAN:  Deanna Artis. Hickling, M.D.DATE OF BIRTH:  1945/10/14   DATE OF ADMISSION:  05/24/2005  DATE OF DISCHARGE:                                HISTORY & PHYSICAL   CHIEF COMPLAINT:  Left-sided weakness.   HISTORY OF PRESENT ILLNESS:  The patient is a 65 year old left-handed  Caucasian married male with sudden onset of left-sided clumsiness around  1700 hours.  He arrived at the [facility] emergency room at [phone].  Call  placed to me around 1745.  CT scan ordered at 1740.  The notation says that  it was done at 1856 which could not be so.  Order was given by Dr. Neale Burly,  EDT physician, to give TPA at 1827.  I presume CT was performed and was  interpreted before that time.  TPA was started at 1846, stopped at 1927.  The patient received 8.6 mg loading dose and approximately half of the 77.3  mg infusion.  The infusion was stopped in the setting of increased right  pupil and increased left-sided weakness.  The patient was transported stat  to [facility] to room 3107.  He was brought down to the CAT scanner at 1951.  There was no change.   PAST MEDICAL HISTORY:  Not known.  The patient has not seen a doctor  throughout most of his adult life.  His blood pressure 187/85 at [facility].  He had a glucose that was slightly elevated at 126.   PAST SURGICAL HISTORY:  None known.   MEDICATIONS:  None.   ALLERGIES:  NONE.   SOCIAL HISTORY:  Two pack per day smoker.  No alcohol.  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  He is  married and has one son.   FAMILY HISTORY:  Father died of a stroke at age 67.  Mother died of a stroke  at age 64.  He has two brothers with history of diabetes and kidney disease  and one sister who has heart disease.   PHYSICAL  EXAMINATION:  VITAL SIGNS:  NIH stroke scale score was 8.  Blood  pressure 150/81, resting pulse 113, temperature 98.9, oxygen saturation 98%.  NECK:  No bruits.  LUNGS:  Clear.  HEART:  No murmurs.  Pulses normal.  ABDOMEN:  Soft.  Bowel sounds normal.  EXTREMITIES:  Unremarkable.  HEENT:  He has a Probation officer.  NEUROLOGIC:  Mental status:  The patient is awake and alert without  dysphasia.  Cranial nerves:  Pupils:  Right greater than left, reactive.  Fundi were normal.  Visual fields full.  Extraocular movements:  He had a  left gaze paresis, symmetrical facial strength, midline tongue.  Motor:  Normal strength on the right.  The left arm is spastic, and he is beginning  to move his fingers a bit.  He was flaccid when I saw him initially.  The  left leg  is flaccid.  Sensory:  Intact to primary modalities, okay to  cortical modalities on the right.  Cerebellar:  Finger-to-nose and rapid  alternating movements are okay on the right.  Gait cannot be tested.  Deep  tendon reflexes are absent.  He had a right flexor and an equivocal left  plantar response.   IMPRESSION:  I suspect a hemispheric infarction of his right brain,  nonhemorrhagic.  This is waxing and waning.  He may have bled into a carotid  artery plaque which would account for the dilated right pupil.   LABORATORY DATA:  Hemoglobin 16.5, hematocrit 48.2, MCV 94.9, white count  12,100, platelet count 304,000, 61 polys, 26 lymphs, 8 monos, 40  eosinophils, 1 basophil.  Sodium 140, potassium 4.1, chloride 105, CO2 27,  glucose 126, BUN 13, creatinine 1.4, calcium 9.1.  PT 12.3, INR 0.9, PTT 30.   PLAN:  Angiogram.  Depending upon its findings, we will carry out either  Merci device or place a stent and then possibly use a Merci device.  The  patient has given informed consent, and I have described it to his wife as  well.      Deanna Artis. Sharene Skeans, M.D.  Electronically Signed     [facility]/MEDQ  D:  05/24/2005  T:   05/26/2005  Job:  [id number]   cc:   Lorin Picket A. Gerda Diss, MD  Fax: [phone]

## 2010-09-26 ENCOUNTER — Encounter (INDEPENDENT_AMBULATORY_CARE_PROVIDER_SITE_OTHER): Payer: BC Managed Care – PPO

## 2010-09-26 DIAGNOSIS — I714 Abdominal aortic aneurysm, without rupture: Secondary | ICD-10-CM

## 2010-10-07 NOTE — Procedures (Unsigned)
DUPLEX ULTRASOUND OF ABDOMINAL AORTA  INDICATION:  Followup abdominal aortic aneurysm.  HISTORY: Diabetes:  No. Cardiac:  No. Hypertension:  No. Smoking:  Currently. Connective Tissue Disorder: Family History:  No. Previous Surgery:  No.  DUPLEX EXAM:         AP (cm)                   TRANSVERSE (cm) Proximal             2.24 cm                   2.28 cm Mid                  4.52 cm                   4.41 cm Distal               4.63 cm                   4.66 cm Right Iliac          1.64 cm                   1.78 cm Left Iliac           1.54 cm                   1.61 cm  PREVIOUS:  Date:  03/20/2010  AP:  4.3  TRANSVERSE:  4.5  IMPRESSION: 1. Infrarenal abdominal aortic aneurysm present measuring 4.63 cm x     4.66 cm without intramural thrombus present. 2. Remainder of the abdominal aorta and iliac arteries are without     dilatation. 3. Essentially unchanged since previous study on 03/20/2010.  ___________________________________________ Janetta Hora. Fields, MD  SH/MEDQ  D:  09/26/2010  T:  09/26/2010  Job:  [id number]

## 2010-11-13 ENCOUNTER — Encounter: Payer: Self-pay | Admitting: Gastroenterology

## 2010-11-18 ENCOUNTER — Encounter: Payer: Self-pay | Admitting: Gastroenterology

## 2011-04-22 ENCOUNTER — Encounter (INDEPENDENT_AMBULATORY_CARE_PROVIDER_SITE_OTHER): Payer: BC Managed Care – PPO | Admitting: *Deleted

## 2011-04-22 DIAGNOSIS — I714 Abdominal aortic aneurysm, without rupture: Secondary | ICD-10-CM

## 2011-04-24 ENCOUNTER — Other Ambulatory Visit: Payer: Self-pay | Admitting: *Deleted

## 2011-04-24 ENCOUNTER — Encounter: Payer: Self-pay | Admitting: Vascular Surgery

## 2011-04-24 DIAGNOSIS — I714 Abdominal aortic aneurysm, without rupture: Secondary | ICD-10-CM

## 2011-04-24 NOTE — Procedures (Unsigned)
DUPLEX ULTRASOUND OF ABDOMINAL AORTA  INDICATION:  Followup abdominal aortic aneurysm.  HISTORY: Diabetes:  no Cardiac:  no Hypertension:  no Smoking:  Yes Connective Tissue Disorder: Family History: Previous Surgery:  DUPLEX EXAM:         AP (cm)                   TRANSVERSE (cm) Proximal             1.56 cm                   1.82 cm Mid                  3.87 cm                   8.90 cm Distal               4.69 cm                   4.66 cm Right Iliac          Not visualized            Not visualized Left Iliac           Not visualized            Not visualized  PREVIOUS:  Date: 09/26/2010  AP:  4.63  TRANSVERSE:  4.66  IMPRESSION: 1. Infrarenal abdominal aortic aneurysm measuring 4.69 x 4.66 cm per     second.  No significant change since previous study of 09/26/2010. 2. Suboptimal visualization of iliac arteries.  ___________________________________________ Janetta Hora Fields, MD  SS/MEDQ  D:  04/22/2011  T:  04/22/2011  Job:  [id number]

## 2011-10-19 ENCOUNTER — Encounter: Payer: Self-pay | Admitting: Neurosurgery

## 2011-10-19 ENCOUNTER — Encounter: Payer: Self-pay | Admitting: Vascular Surgery

## 2011-10-20 ENCOUNTER — Ambulatory Visit (INDEPENDENT_AMBULATORY_CARE_PROVIDER_SITE_OTHER): Payer: Medicare Other | Admitting: Neurosurgery

## 2011-10-20 ENCOUNTER — Encounter (INDEPENDENT_AMBULATORY_CARE_PROVIDER_SITE_OTHER): Payer: Medicare Other | Admitting: *Deleted

## 2011-10-20 ENCOUNTER — Encounter: Payer: Self-pay | Admitting: Neurosurgery

## 2011-10-20 VITALS — BP 148/87 | HR 79 | Resp 16 | Ht 67.0 in | Wt 197.3 lb

## 2011-10-20 DIAGNOSIS — I714 Abdominal aortic aneurysm, without rupture, unspecified: Secondary | ICD-10-CM

## 2011-10-20 NOTE — Progress Notes (Signed)
Subjective:     Patient ID: Blake Cherry, male   DOB: 1945/06/01, 66 y.o.   MRN: [id number]  HPI: 65 year old male patient of Dr. Darrick Penna followed for known AAA. The patient denies any unusual abdominal or back pain, any new recent medical diagnoses or surgery.   Review of Systems: 12 point review of systems is notable for the difficulties described above otherwise unremarkable     Objective:   Physical Exam: Afebrile, vital signs are stable, there is no abdominal mass palpated due to girth, the patient has no carotid bruits     Assessment:     Asymptomatic AAA unchanged from previous exam. Maximum diameter today is 4.7 which is virtually unchanged from February 2013    Plan:     Patient will followup in 6 months with a repeat AAA duplex, his questions were encouraged and answered, he is in agreement with this plan.  Lauree Chandler ANP  Clinic M.D.: Early

## 2011-10-20 NOTE — Addendum Note (Signed)
Addended by: Sharee Pimple on: 10/20/2011 12:09 PM   Modules accepted: Orders

## 2012-04-20 ENCOUNTER — Encounter: Payer: Self-pay | Admitting: Neurosurgery

## 2012-04-21 ENCOUNTER — Encounter (INDEPENDENT_AMBULATORY_CARE_PROVIDER_SITE_OTHER): Payer: Medicare Other | Admitting: *Deleted

## 2012-04-21 ENCOUNTER — Ambulatory Visit (INDEPENDENT_AMBULATORY_CARE_PROVIDER_SITE_OTHER): Payer: Medicare Other | Admitting: Neurosurgery

## 2012-04-21 ENCOUNTER — Encounter: Payer: Self-pay | Admitting: Neurosurgery

## 2012-04-21 VITALS — BP 143/89 | HR 71 | Resp 14 | Ht 67.0 in | Wt 205.0 lb

## 2012-04-21 DIAGNOSIS — Z01810 Encounter for preprocedural cardiovascular examination: Secondary | ICD-10-CM

## 2012-04-21 DIAGNOSIS — I714 Abdominal aortic aneurysm, without rupture: Secondary | ICD-10-CM

## 2012-04-21 NOTE — Progress Notes (Signed)
[facility] [place] AAA/Carotid Office Note  CC: AAA surveillance Referring Physician: Fields  History of Present Illness: 66 year old male patient of Dr. Darrick Penna followed for known AAA. The patient denies any unusual abdominal or back pain. The patient denies any new medical diagnoses or recent surgery.  Past Medical History  Diagnosis Date  . Stroke 05/2005  . AAA (abdominal aortic aneurysm)     ROS: [x]  Positive   [ ]  Denies    General: [ ]  Weight loss, [ ]  Fever, [ ]  chills Neurologic: [ ]  Dizziness, [ ]  Blackouts, [ ]  Seizure [ ]  Stroke, [ ]  "Mini stroke", [ ]  Slurred speech, [ ]  Temporary blindness; [ ]  weakness in arms or legs, [ ]  Hoarseness Cardiac: [ ]  Chest pain/pressure, [ ]  Shortness of breath at rest [ ]  Shortness of breath with exertion, [ ]  Atrial fibrillation or irregular heartbeat Vascular: [ ]  Pain in legs with walking, [ ]  Pain in legs at rest, [ ]  Pain in legs at night,  [ ]  Non-healing ulcer, [ ]  Blood clot in vein/DVT,   Pulmonary: [ ]  Home oxygen, [ ]  Productive cough, [ ]  Coughing up blood, [ ]  Asthma,  [ ]  Wheezing Musculoskeletal:  [ ]  Arthritis, [ ]  Low back pain, [ ]  Joint pain Hematologic: [ ]  Easy Bruising, [ ]  Anemia; [ ]  Hepatitis Gastrointestinal: [ ]  Blood in stool, [ ]  Gastroesophageal Reflux/heartburn, [ ]  Trouble swallowing Urinary: [ ]  chronic Kidney disease, [ ]  on HD - [ ]  MWF or [ ]  TTHS, [ ]  Burning with urination, [ ]  Difficulty urinating Skin: [ ]  Rashes, [ ]  Wounds Psychological: [ ]  Anxiety, [ ]  Depression   Social History History  Substance Use Topics  . Smoking status: Current Every Day Smoker -- 0.50 packs/day    Types: Cigarettes  . Smokeless tobacco: Former Neurosurgeon    Types: Snuff  . Alcohol Use: No    Family History Family History  Problem Relation Age of Onset  . Cancer Mother     Breast cancer    No Known Allergies  Current Outpatient Prescriptions  Medication Sig Dispense Refill  . aspirin  325 MG tablet Take 325 mg by mouth daily.      . Multiple Vitamin (MULTIVITAMIN) tablet Take 1 tablet by mouth daily.      . Omega-3 Fatty Acids (FISH OIL) 1000 MG CAPS Take 1,000 mg by mouth 2 (two) times daily.       No current facility-administered medications for this visit.    Physical Examination  Filed Vitals:   04/21/12 0908  BP: 143/89  Pulse: 71  Resp: 14    Body mass index is 32.1 kg/(m^2).  General:  WDWN in NAD Gait: Normal HEENT: WNL Eyes: Pupils equal Pulmonary: normal non-labored breathing , without Rales, rhonchi,  wheezing Cardiac: RRR, without  Murmurs, rubs or gallops; Abdomen: soft, NT, no masses Skin: no rashes, ulcers noted  Vascular Exam Pulses: Palpable femoral pulses bilaterally, there is no abdominal mass palpated due to girth Carotid bruits: Carotid pulses to auscultation Extremities without ischemic changes, no Gangrene , no cellulitis; no open wounds;  Musculoskeletal: no muscle wasting or atrophy   Neurologic: A&O X 3; Appropriate Affect ; SENSATION: normal; MOTOR FUNCTION:  moving all extremities equally. Speech is fluent/normal  Non-Invasive Vascular Imaging AAA duplex today shows a maximum diameter of 5.1 AP by 4.8 transverse which is a slight increase from August 2013 when he was 4.7. This was reviewed  with Dr. Darrick Penna and he agrees that we should go ahead and obtain a CT of the chest abdomen and pelvis for anatomical review for possible correction.  ASSESSMENT/PLAN: Asymptomatic patient that will obtain a CT of the chest abdomen and pelvis and return to see Dr. Darrick Penna in the next 2-3 weeks. The patient is in agreement with this plan, his questions were encouraged and answered.  Lauree Chandler ANP   Clinic MD: Darrick Penna

## 2012-04-21 NOTE — Addendum Note (Signed)
Addended by: Sharee Pimple on: 04/21/2012 01:40 PM   Modules accepted: Orders

## 2012-05-05 ENCOUNTER — Other Ambulatory Visit: Payer: Self-pay | Admitting: Vascular Surgery

## 2012-05-06 LAB — CREATININE, SERUM: Creat: 1.07 mg/dL (ref 0.50–1.35)

## 2012-05-06 LAB — BUN: BUN: 13 mg/dL (ref 6–23)

## 2012-05-11 ENCOUNTER — Encounter: Payer: Self-pay | Admitting: Vascular Surgery

## 2012-05-12 ENCOUNTER — Encounter: Payer: Self-pay | Admitting: Vascular Surgery

## 2012-05-12 ENCOUNTER — Ambulatory Visit (INDEPENDENT_AMBULATORY_CARE_PROVIDER_SITE_OTHER): Payer: Medicare Other | Admitting: Vascular Surgery

## 2012-05-12 ENCOUNTER — Ambulatory Visit
Admission: RE | Admit: 2012-05-12 | Discharge: 2012-05-12 | Disposition: A | Discharge status: Home / Self Care | Payer: Medicare Other | Source: Ambulatory Visit | Attending: Vascular Surgery | Admitting: Vascular Surgery

## 2012-05-12 VITALS — BP 148/92 | HR 83 | Resp 16 | Ht 68.0 in | Wt 209.0 lb

## 2012-05-12 DIAGNOSIS — I714 Abdominal aortic aneurysm, without rupture, unspecified: Secondary | ICD-10-CM

## 2012-05-12 DIAGNOSIS — Z01810 Encounter for preprocedural cardiovascular examination: Secondary | ICD-10-CM

## 2012-05-12 MED ORDER — IOHEXOL 350 MG/ML SOLN
100.0000 mL | Freq: Once | INTRAVENOUS | Status: AC | PRN
  Administered 2012-05-12: 100 mL via INTRAVENOUS

## 2012-05-12 NOTE — Progress Notes (Signed)
[facility] [place] AAA/Carotid Office Note   History of Present Illness: 67 year old male patient followed for known AAA. This was 4.7 cm in August of 2013.  Recent ultrasound suggested that the aneurysm had grown to over 5 cm. The patient was scheduled for a CT angiogram. He returns today for further followup. The patient denies any unusual abdominal or back pain. The patient denies any new medical diagnoses or recent surgery.    Past Medical History   Diagnosis  Date   .  Stroke  05/2005   .  AAA (abdominal aortic aneurysm)      Past Surgical History  Procedure Laterality Date  . Carotid stent  2007    left internal carotid artery  Deveshwar  ROS: [x]  Positive   [ ]  Denies     General: [ ]  Weight loss, [ ]  Fever, [ ]  chills Neurologic: [ ]  Dizziness, [ ]  Blackouts, [ ]  Seizure [ ]  Stroke, [ ]  "Mini stroke", [ ]  Slurred speech, [ ]  Temporary blindness; [ ]  weakness in arms or legs, [ ]  Hoarseness Cardiac: [ ]  Chest pain/pressure, [ ]  Shortness of breath at rest [ ]  Shortness of breath with exertion, [ ]  Atrial fibrillation or irregular heartbeat Vascular: [ ]  Pain in legs with walking, [ ]  Pain in legs at rest, [ ]  Pain in legs at night,  [ ]  Non-healing ulcer, [ ]  Blood clot in vein/DVT,    Pulmonary: [ ]  Home oxygen, [ ]  Productive cough, [ ]  Coughing up blood, [ ]  Asthma,  [ ]  Wheezing Musculoskeletal:  [ ]  Arthritis, [ ]  Low back pain, [ ]  Joint pain Hematologic: [ ]  Easy Bruising, [ ]  Anemia; [ ]  Hepatitis Gastrointestinal: [ ]  Blood in stool, [ ]  Gastroesophageal Reflux/heartburn, [ ]  Trouble swallowing Urinary: [ ]  chronic Kidney disease, [ ]  on HD - [ ]  MWF or [ ]  TTHS, [ ]  Burning with urination, [ ]  Difficulty urinating Skin: [ ]  Rashes, [ ]  Wounds Psychological: [ ]  Anxiety, [ ]  Depression   Social History History   Substance Use Topics   .  Smoking status:  Current Every Day Smoker -- 0.50 packs/day       Types:  Cigarettes   .  Smokeless  tobacco:  Former Neurosurgeon       Types:  Snuff   .  Alcohol Use:  No     Family History Family History   Problem  Relation  Age of Onset   .  Cancer  Mother         Breast cancer     No Known Allergies    Current Outpatient Prescriptions   Medication  Sig  Dispense  Refill   .  aspirin 325 MG tablet  Take 325 mg by mouth daily.         .  Multiple Vitamin (MULTIVITAMIN) tablet  Take 1 tablet by mouth daily.         .  Omega-3 Fatty Acids (FISH OIL) 1000 MG CAPS  Take 1,000 mg by mouth 2 (two) times daily.            No current facility-administered medications for this visit.     Physical Examination    Filed Vitals:     04/21/12 0908   BP:  143/89   Pulse:  71   Resp:  14     Body mass index is 32.1 kg/(m^2).  General:  WDWN in NAD HEENT: WNL Eyes:  Pupils equal Pulmonary: normal non-labored breathing , without Rales, rhonchi,  wheezing Cardiac: RRR, without  Murmurs, rubs or gallops; Abdomen: soft, NT, no masses, obese Skin: no rashes, ulcers noted  Vascular Exam Pulses: Palpable femoral pulses bilaterally, there is no abdominal mass palpated due to girth Neck: no carotid bruit Extremities without ischemic changes, no Gangrene , no cellulitis; no open wounds;  Musculoskeletal: no muscle wasting or atrophy              Neurologic: A&O X 3; Appropriate Affect ; SENSATION: normal; MOTOR FUNCTION:  moving all extremities equally. Speech is fluent/normal  Data: CT Angio the abdomen and pelvis is reviewed today. The abdominal aortic aneurysm diameter is 5 cm. There is a 2.3 cm neck. There are multiple renal arteries bilaterally. These are all small but patent. He has a 12 mm femoral artery bilaterally  ASSESSMENT/PLAN: Slowly growing abdominal aortic aneurysm. I discussed with the patient today the risk of aneurysm rupture less than 5-1/2 cm is less than 1% per year. I also discussed with him that the aneurysm has grown and he could consider repair at this point. At this  point he has decided to defer repair for a few months. He will return for a repeat aortic aneurysm duplex in 3 months time. If his aneurysm continues to grow we will need to consider repair of this. Based on anatomic features today he would be a candidate for percutaneous endovascular stent graft repair. Patient was advised the emergency room if he develops severe abdominal or back pain.   Fabienne Bruns, MD [facility] [place] Office: [phone] Pager: [phone]

## 2012-05-24 ENCOUNTER — Other Ambulatory Visit: Payer: Self-pay | Admitting: *Deleted

## 2012-05-24 DIAGNOSIS — I714 Abdominal aortic aneurysm, without rupture: Secondary | ICD-10-CM

## 2012-08-03 ENCOUNTER — Encounter: Payer: Self-pay | Admitting: Vascular Surgery

## 2012-08-04 ENCOUNTER — Encounter: Payer: Self-pay | Admitting: Vascular Surgery

## 2012-08-04 ENCOUNTER — Ambulatory Visit (INDEPENDENT_AMBULATORY_CARE_PROVIDER_SITE_OTHER): Payer: Medicare Other | Admitting: Vascular Surgery

## 2012-08-04 ENCOUNTER — Other Ambulatory Visit: Payer: Self-pay | Admitting: *Deleted

## 2012-08-04 ENCOUNTER — Encounter (INDEPENDENT_AMBULATORY_CARE_PROVIDER_SITE_OTHER): Payer: Medicare Other | Admitting: *Deleted

## 2012-08-04 VITALS — BP 158/90 | HR 70 | Resp 16 | Ht 68.0 in | Wt 207.0 lb

## 2012-08-04 DIAGNOSIS — I714 Abdominal aortic aneurysm, without rupture: Secondary | ICD-10-CM

## 2012-08-04 DIAGNOSIS — Z01810 Encounter for preprocedural cardiovascular examination: Secondary | ICD-10-CM

## 2012-08-05 ENCOUNTER — Encounter: Payer: Self-pay | Admitting: Internal Medicine

## 2012-08-05 ENCOUNTER — Ambulatory Visit (INDEPENDENT_AMBULATORY_CARE_PROVIDER_SITE_OTHER): Payer: Medicare Other | Admitting: Internal Medicine

## 2012-08-05 VITALS — BP 150/80 | HR 82 | Ht 68.0 in | Wt 206.0 lb

## 2012-08-05 DIAGNOSIS — I10 Essential (primary) hypertension: Secondary | ICD-10-CM

## 2012-08-05 NOTE — Progress Notes (Addendum)
[facility] [place] AAA/Carotid Office Note   History of Present Illness: 67 year old male patient followed for known AAA. This was 4.7 cm in August of 2013.  Recent ultrasound suggested that the aneurysm had grown to over 5 cm. The patient was scheduled for a CT angiogram. He returns today for further followup. The patient denies any unusual abdominal or back pain. The patient denies any new medical diagnoses or recent surgery.    Past Medical History    Diagnosis   Date    .   Stroke   05/2005    .   AAA (abdominal aortic aneurysm)          Past Surgical History   Procedure  Laterality  Date   .  Carotid stent    2007       left internal carotid artery   Deveshwar  ROS: [x]  Positive   [ ]  Denies     General: [ ]  Weight loss, [ ]  Fever, [ ]  chills Neurologic: [ ]  Dizziness, [ ]  Blackouts, [ ]  Seizure [ ]  Stroke, [ ]  "Mini stroke", [ ]  Slurred speech, [ ]  Temporary blindness; [ ]  weakness in arms or legs, [ ]  Hoarseness Cardiac: [ ]  Chest pain/pressure, [ ]  Shortness of breath at rest [ ]  Shortness of breath with exertion, [ ]  Atrial fibrillation or irregular heartbeat Vascular: [ ]  Pain in legs with walking, [ ]  Pain in legs at rest, [ ]  Pain in legs at night,  [ ]  Non-healing ulcer, [ ]  Blood clot in vein/DVT,    Pulmonary: [ ]  Home oxygen, [ ]  Productive cough, [ ]  Coughing up blood, [ ]  Asthma,  [ ]  Wheezing Musculoskeletal:  [ ]  Arthritis, [ ]  Low back pain, [ ]  Joint pain Hematologic: [ ]  Easy Bruising, [ ]  Anemia; [ ]  Hepatitis Gastrointestinal: [ ]  Blood in stool, [ ]  Gastroesophageal Reflux/heartburn, [ ]  Trouble swallowing Urinary: [ ]  chronic Kidney disease, [ ]  on HD - [ ]  MWF or [ ]  TTHS, [ ]  Burning with urination, [ ]  Difficulty urinating Skin: [ ]  Rashes, [ ]  Wounds Psychological: [ ]  Anxiety, [ ]  Depression   Social History History    Substance Use Topics    .   Smoking status:   Current Every Day Smoker -- 0.50 packs/day          Types:    Cigarettes    .   Smokeless tobacco:   Former Neurosurgeon          Types:   Snuff    .   Alcohol Use:   No      Family History Family History    Problem   Relation   Age of Onset    .   Cancer   Mother             Breast cancer      No Known Allergies    Current Outpatient Prescriptions    Medication   Sig   Dispense   Refill    .   aspirin 325 MG tablet   Take 325 mg by mouth daily.            .   Multiple Vitamin (MULTIVITAMIN) tablet   Take 1 tablet by mouth daily.            .   Omega-3 Fatty Acids (FISH OIL) 1000 MG CAPS   Take 1,000 mg by mouth 2 (two) times daily.  No current facility-administered medications for this visit.      Physical Examination  Filed Vitals:   08/04/12 0916  BP: 158/90  Pulse: 70  Resp: 16  Height: 5\' 8"  (1.727 m)  Weight: 207 lb (93.895 kg)  SpO2: 98%   Body mass index is 32.1 kg/(m^2).  General:  WDWN in NAD HEENT: WNL Eyes: Pupils equal Pulmonary: normal non-labored breathing , without Rales, rhonchi,  wheezing Cardiac: RRR, without  Murmurs, rubs or gallops; Abdomen: soft, NT, no masses, obese Skin: no rashes, ulcers noted  Vascular Exam  Pulses: Palpable femoral pulses bilaterally, there is no abdominal mass palpated due to girth Neck: no carotid bruit Extremities without ischemic changes, no Gangrene , no cellulitis; no open wounds;   Musculoskeletal: no muscle wasting or atrophy              Neurologic: A&O X 3; Appropriate Affect ; SENSATION: normal; MOTOR FUNCTION:  moving all extremities equally. Speech is fluent/normal  Data: CT Angio the abdomen and pelvis is reviewed today. The abdominal aortic aneurysm diameter is 5 cm. There is a 2.3 cm neck. There are multiple renal arteries bilaterally. These are all small but patent. He has a 12 mm femoral artery bilaterally  ASSESSMENT/PLAN: At this point the patient wishes elective endovascular stent graft repair of his abdominal aortic aneurysm. This will be scheduled for  late June or early July. He will be referred for cardiac risk stratification prior to this.  Risks benefits possible complications and procedure details were explained to the patient today including but not limited to bleeding infection possible conversion open repair need for stent graft surveillance postoperatively contrast reaction myocardial events. He understands and agrees to proceed.  Fabienne Bruns, MD [facility] [place] Office: [phone] Pager: [phone]

## 2012-08-05 NOTE — Patient Instructions (Addendum)
Your physician recommends that you schedule a follow-up appointment in: AS NEEDED  YOUR PHYSICIAN RECOMMENDS THAT YOU FOLLOW UP WITH YOUR PCP TO DISCUSS BEING PLACED ON A STATIN FOR YOUR CHOLESTEROL  YOUR PHYSICIAN HAS CLEARED YOU FOR SURGERY AND WILL BE IN CONTACT WITH YOUR SURGEON TO ADVISE

## 2012-08-05 NOTE — Progress Notes (Signed)
HPI Patient is a 67 yo who has a history of AAA  Being evaluated Walks on treadmill 30 min every day.  Walks 1 mile. Does some mowing of lawn.  2 wks ago last time mowed.  More tired than he was a couple years ago doing but has noted no change in past 6 months. He denies PND, palpitations, CP.  Denies dizziness  No edema. No Known Allergies  Current Outpatient Prescriptions  Medication Sig Dispense Refill  . aspirin 325 MG tablet Take 325 mg by mouth daily.      . Multiple Vitamin (MULTIVITAMIN) tablet Take 1 tablet by mouth daily.      . Omega-3 Fatty Acids (FISH OIL) 1000 MG CAPS Take 1,000 mg by mouth 2 (two) times daily.       No current facility-administered medications for this visit.    Past Medical History  Diagnosis Date  . Stroke 05/2005  . AAA (abdominal aortic aneurysm)   . Carotid artery occlusion 2007    Left cea    Past Surgical History  Procedure Laterality Date  . Carotid stent  2007    left internal carotid artery  . Carotid endarterectomy Left 2007    Family History  Problem Relation Age of Onset  . Cancer Mother     Breast cancer    History   Social History  . Marital Status: Married    Spouse Name: N/A    Number of Children: N/A  . Years of Education: N/A   Occupational History  . Not on file.   Social History Main Topics  . Smoking status: Current Every Day Smoker -- 0.50 packs/day    Types: Cigarettes  . Smokeless tobacco: Former Neurosurgeon    Types: Snuff  . Alcohol Use: No  . Drug Use: No  . Sexually Active: Not on file   Other Topics Concern  . Not on file   Social History Narrative  . No narrative on file    Review of Systems:  All systems reviewed.  They are negative to the above problem except as previously stated.  Vital Signs: BP 150/80  Pulse 82  Ht 5\' 8"  (1.727 m)  Wt 206 lb (93.441 kg)  BMI 31.33 kg/m2  Physical Exam Patient is in NAD HEENT:  Normocephalic, atraumatic. EOMI, PERRLA.  Neck: JVP is normal.  No  bruits.  Lungs: clear to auscultation. No rales no wheezes.  Heart: Regular rate and rhythm. Normal S1, S2. No S3.   No significant murmurs. PMI not displaced.  Abdomen:  Supple, nontender. Normal bowel sounds. No masses. No hepatomegaly.  Extremities:   Good distal pulses throughout. No lower extremity edema.  Musculoskeletal :moving all extremities.  Neuro:   alert and oriented x3.  CN II-XII grossly intact.  EKG  SR  82 bpm Assessment and Plan:  1.  Preop evaluation.  Patient has vascular disease (CV disease, aortic)  He most likely has CAD  But, he is without symptoms suspicious of angina.  He has no evidence of CHF I think he is at low risk for a major cardiac event in the periop period and OK to proceed without further cardiac testing  He should be on a statin  Will get labs  2.  HTN  BP is a little high today  It has been lower   Will need to be followed  3.  HCM  Check on lipids.  4.  OBesity  Needs to lose wt  Recomm increase activity  5.,  Tobacco  He admits to smoking  Quit for short period in life then went back  Gave him options of drug Rx, counselling.  He says he knows he needs to address.  F/U PRN.

## 2012-08-08 ENCOUNTER — Other Ambulatory Visit: Payer: Self-pay

## 2012-08-11 ENCOUNTER — Encounter (HOSPITAL_COMMUNITY): Payer: Self-pay | Admitting: Pharmacy Technician

## 2012-08-11 ENCOUNTER — Ambulatory Visit: Payer: Medicare Other | Admitting: Vascular Surgery

## 2012-08-15 ENCOUNTER — Encounter: Payer: Self-pay | Admitting: Vascular Surgery

## 2012-08-16 ENCOUNTER — Encounter (HOSPITAL_COMMUNITY)
Admission: RE | Admit: 2012-08-16 | Discharge: 2012-08-16 | Disposition: A | Discharge status: Home / Self Care | Payer: Medicare Other | Source: Ambulatory Visit | Attending: Vascular Surgery | Admitting: Vascular Surgery

## 2012-08-16 ENCOUNTER — Ambulatory Visit (HOSPITAL_COMMUNITY)
Admission: RE | Admit: 2012-08-16 | Discharge: 2012-08-16 | Disposition: A | Discharge status: Home / Self Care | Payer: Medicare Other | Source: Ambulatory Visit | Attending: Anesthesiology | Admitting: Anesthesiology

## 2012-08-16 ENCOUNTER — Encounter (HOSPITAL_COMMUNITY): Payer: Self-pay

## 2012-08-16 DIAGNOSIS — Z01818 Encounter for other preprocedural examination: Secondary | ICD-10-CM | POA: Insufficient documentation

## 2012-08-16 DIAGNOSIS — J438 Other emphysema: Secondary | ICD-10-CM | POA: Insufficient documentation

## 2012-08-16 DIAGNOSIS — I700 Atherosclerosis of aorta: Secondary | ICD-10-CM | POA: Insufficient documentation

## 2012-08-16 DIAGNOSIS — Z01812 Encounter for preprocedural laboratory examination: Secondary | ICD-10-CM | POA: Insufficient documentation

## 2012-08-16 DIAGNOSIS — Z0183 Encounter for blood typing: Secondary | ICD-10-CM | POA: Insufficient documentation

## 2012-08-16 HISTORY — DX: Shortness of breath: R06.02

## 2012-08-16 HISTORY — DX: Diverticulitis of intestine, part unspecified, without perforation or abscess without bleeding: K57.92

## 2012-08-16 HISTORY — DX: Essential (primary) hypertension: I10

## 2012-08-16 LAB — URINALYSIS, ROUTINE W REFLEX MICROSCOPIC
Bilirubin Urine: NEGATIVE
Glucose, UA: NEGATIVE mg/dL
Hgb urine dipstick: NEGATIVE
Ketones, ur: NEGATIVE mg/dL
Leukocytes, UA: NEGATIVE
Nitrite: NEGATIVE
Protein, ur: NEGATIVE mg/dL
Specific Gravity, Urine: 1.023 (ref 1.005–1.030)
Urobilinogen, UA: 0.2 mg/dL (ref 0.0–1.0)
pH: 5.5 (ref 5.0–8.0)

## 2012-08-16 LAB — CBC
HCT: 48.6 % (ref 39.0–52.0)
Hemoglobin: 16.6 g/dL (ref 13.0–17.0)
MCH: 32.7 pg (ref 26.0–34.0)
MCHC: 34.2 g/dL (ref 30.0–36.0)
MCV: 95.9 fL (ref 78.0–100.0)
Platelets: 263 10*3/uL (ref 150–400)
RBC: 5.07 MIL/uL (ref 4.22–5.81)
RDW: 13.3 % (ref 11.5–15.5)
WBC: 10.6 10*3/uL — ABNORMAL HIGH (ref 4.0–10.5)

## 2012-08-16 LAB — COMPREHENSIVE METABOLIC PANEL
ALT: 11 U/L (ref 0–53)
AST: 25 U/L (ref 0–37)
Albumin: 3.8 g/dL (ref 3.5–5.2)
Alkaline Phosphatase: 73 U/L (ref 39–117)
BUN: 15 mg/dL (ref 6–23)
CO2: 21 mEq/L (ref 19–32)
Calcium: 8.5 mg/dL (ref 8.4–10.5)
Chloride: 104 mEq/L (ref 96–112)
Creatinine, Ser: 0.97 mg/dL (ref 0.50–1.35)
GFR calc Af Amer: >90 mL/min (ref >90)
GFR calc non Af Amer: 85 mL/min — ABNORMAL LOW (ref >90)
Glucose, Bld: 86 mg/dL (ref 70–99)
Potassium: 4.7 mEq/L (ref 3.5–5.1)
Sodium: 136 mEq/L (ref 135–145)
Total Bilirubin: 0.3 mg/dL (ref 0.3–1.2)
Total Protein: 7 g/dL (ref 6.0–8.3)

## 2012-08-16 LAB — PROTIME-INR
INR: 0.93 (ref 0.00–1.49)
Prothrombin Time: 12.4 seconds (ref 11.6–15.2)

## 2012-08-16 LAB — SURGICAL PCR SCREEN
MRSA, PCR: NEGATIVE
Staphylococcus aureus: POSITIVE — AB

## 2012-08-16 LAB — TYPE AND SCREEN
ABO/RH(D): A POS
Antibody Screen: NEGATIVE

## 2012-08-16 LAB — APTT: aPTT: 26 seconds (ref 24–37)

## 2012-08-16 LAB — ABO/RH: ABO/RH(D): A POS

## 2012-08-16 NOTE — Pre-Procedure Instructions (Signed)
Kenichi Cassada Simonin  08/16/2012   Your procedure is scheduled on:  Monday, June 30th.  Report to [facility] at 7:30 AM. Enter through the Main Entrance, take [place] Elevators to 3rd Floor- Short Stay.  Call this number if you have problems the morning of surgery: [phone]   Remember:   Do not eat food or drink liquids after midnight.   Take these medicines the morning of surgery with A SIP OF WATER: none Stop taking Aspirin, Coumadin, Plavix, Effient and Herbal medications.  Do not take any NSAIDs ie: Ibuprofen,  Advil,Naproxen or any medication containing Aspirin.   Do not wear jewelry, make-up or nail polish.  Do not wear lotions, powders, or perfumes. You may wear deodorant.  Men may shave face and neck.  Do not bring valuables to the hospital.  [facility] is not responsible for any belongings or valuables.  Contacts, dentures or bridgework may not be worn into surgery.  Leave suitcase in the car. After surgery it may be brought to your room.  For patients admitted to the hospital, checkout time is 11:00 AM the day of discharge.   Special Instructions: Shower using CHG 2 nights before surgery and the night before surgery.  If you shower the day of surgery use CHG.  Use special wash - you have one bottle of CHG for all showers.  You should use approximately 1/3 of the bottle for each shower.   Please read over the following fact sheets that you were given: Pain Booklet, Coughing and Deep Breathing, Blood Transfusion Information and Surgical Site Infection Prevention

## 2012-08-18 NOTE — Progress Notes (Signed)
Spoke with [facility] medical records re: EKG and they state that Dr Tenny Craw probably "has it with her" and that she will be back in the office tomorrow.  She recommended we check back tomorrow to see if it is available.  If unable to obtain before surgery, will have to do another one.

## 2012-08-19 NOTE — Progress Notes (Signed)
NOTIFIED PATIENT OF TIME CHANGE, TO ARRIVE AT 530 AM 08/22/12.

## 2012-08-21 MED ORDER — DEXTROSE 5 % IV SOLN
1.5000 g | INTRAVENOUS | Status: AC
  Administered 2012-08-22: 1.5 g via INTRAVENOUS
  Filled 2012-08-21: qty 1.5

## 2012-08-22 ENCOUNTER — Other Ambulatory Visit: Payer: Self-pay | Admitting: *Deleted

## 2012-08-22 ENCOUNTER — Inpatient Hospital Stay (HOSPITAL_COMMUNITY)
Admission: RE | Admit: 2012-08-22 | Discharge: 2012-08-23 | DRG: 238 | Disposition: A | Discharge status: Home / Self Care | Payer: Medicare Other | Source: Ambulatory Visit | Attending: Vascular Surgery | Admitting: Vascular Surgery

## 2012-08-22 ENCOUNTER — Encounter (HOSPITAL_COMMUNITY): Payer: Self-pay | Admitting: Anesthesiology

## 2012-08-22 ENCOUNTER — Encounter (HOSPITAL_COMMUNITY): Payer: Self-pay | Admitting: *Deleted

## 2012-08-22 ENCOUNTER — Encounter (HOSPITAL_COMMUNITY)
Admission: RE | Disposition: A | Discharge status: Home / Self Care | Payer: Self-pay | Source: Ambulatory Visit | Attending: Vascular Surgery

## 2012-08-22 ENCOUNTER — Inpatient Hospital Stay (HOSPITAL_COMMUNITY): Payer: Medicare Other | Admitting: Anesthesiology

## 2012-08-22 ENCOUNTER — Inpatient Hospital Stay (HOSPITAL_COMMUNITY): Payer: Medicare Other

## 2012-08-22 DIAGNOSIS — Z48812 Encounter for surgical aftercare following surgery on the circulatory system: Secondary | ICD-10-CM

## 2012-08-22 DIAGNOSIS — Z7982 Long term (current) use of aspirin: Secondary | ICD-10-CM

## 2012-08-22 DIAGNOSIS — I714 Abdominal aortic aneurysm, without rupture, unspecified: Principal | ICD-10-CM | POA: Diagnosis present

## 2012-08-22 DIAGNOSIS — I10 Essential (primary) hypertension: Secondary | ICD-10-CM | POA: Diagnosis present

## 2012-08-22 DIAGNOSIS — I739 Peripheral vascular disease, unspecified: Secondary | ICD-10-CM | POA: Diagnosis present

## 2012-08-22 DIAGNOSIS — Z8673 Personal history of transient ischemic attack (TIA), and cerebral infarction without residual deficits: Secondary | ICD-10-CM

## 2012-08-22 DIAGNOSIS — F17200 Nicotine dependence, unspecified, uncomplicated: Secondary | ICD-10-CM | POA: Diagnosis present

## 2012-08-22 DIAGNOSIS — Z79899 Other long term (current) drug therapy: Secondary | ICD-10-CM

## 2012-08-22 HISTORY — PX: ABDOMINAL AORTIC ENDOVASCULAR STENT GRAFT: SHX5707

## 2012-08-22 LAB — BLOOD GAS, ARTERIAL
Acid-base deficit: 0.8 mmol/L (ref 0.0–2.0)
Bicarbonate: 23.7 mEq/L (ref 20.0–24.0)
Drawn by: 18160
FIO2: 0.21 %
O2 Saturation: 96.6 %
Patient temperature: 98.6
TCO2: 25 mmol/L (ref 0–100)
pCO2 arterial: 41.9 mmHg (ref 35.0–45.0)
pH, Arterial: 7.372 (ref 7.350–7.450)
pO2, Arterial: 80.6 mmHg (ref 80.0–100.0)

## 2012-08-22 LAB — CBC
HCT: 43.8 % (ref 39.0–52.0)
HCT: 44.9 % (ref 39.0–52.0)
Hemoglobin: 15.2 g/dL (ref 13.0–17.0)
Hemoglobin: 15.7 g/dL (ref 13.0–17.0)
MCH: 33.1 pg (ref 26.0–34.0)
MCH: 33.3 pg (ref 26.0–34.0)
MCHC: 34.7 g/dL (ref 30.0–36.0)
MCHC: 35 g/dL (ref 30.0–36.0)
MCV: 95.4 fL (ref 78.0–100.0)
MCV: 95.1 fL (ref 78.0–100.0)
Platelets: 196 10*3/uL (ref 150–400)
Platelets: 228 10*3/uL (ref 150–400)
RBC: 4.59 MIL/uL (ref 4.22–5.81)
RBC: 4.72 MIL/uL (ref 4.22–5.81)
RDW: 13.2 % (ref 11.5–15.5)
RDW: 13.1 % (ref 11.5–15.5)
WBC: 11 10*3/uL — ABNORMAL HIGH (ref 4.0–10.5)
WBC: 13.8 10*3/uL — ABNORMAL HIGH (ref 4.0–10.5)

## 2012-08-22 LAB — BASIC METABOLIC PANEL
BUN: 13 mg/dL (ref 6–23)
CO2: 25 mEq/L (ref 19–32)
Calcium: 8 mg/dL — ABNORMAL LOW (ref 8.4–10.5)
Chloride: 102 mEq/L (ref 96–112)
Creatinine, Ser: 1.08 mg/dL (ref 0.50–1.35)
GFR calc Af Amer: 81 mL/min — ABNORMAL LOW (ref >90)
GFR calc non Af Amer: 70 mL/min — ABNORMAL LOW (ref >90)
Glucose, Bld: 112 mg/dL — ABNORMAL HIGH (ref 70–99)
Potassium: 4 mEq/L (ref 3.5–5.1)
Sodium: 135 mEq/L (ref 135–145)

## 2012-08-22 LAB — PROTIME-INR
INR: 1.02 (ref 0.00–1.49)
Prothrombin Time: 13.2 seconds (ref 11.6–15.2)

## 2012-08-22 LAB — APTT: aPTT: 32 seconds (ref 24–37)

## 2012-08-22 LAB — MAGNESIUM: Magnesium: 2.1 mg/dL (ref 1.5–2.5)

## 2012-08-22 SURGERY — Surgical Case
Anesthesia: *Unknown

## 2012-08-22 SURGERY — INSERTION, ENDOVASCULAR STENT GRAFT, AORTA, ABDOMINAL
Anesthesia: General | Site: Abdomen | Wound class: Clean

## 2012-08-22 MED ORDER — LACTATED RINGERS IV SOLN
INTRAVENOUS | Status: DC | PRN
  Administered 2012-08-22: 07:00:00 via INTRAVENOUS

## 2012-08-22 MED ORDER — MIDAZOLAM HCL 5 MG/5ML IJ SOLN
INTRAMUSCULAR | Status: DC | PRN
  Administered 2012-08-22 (×2): 1 mg via INTRAVENOUS

## 2012-08-22 MED ORDER — POTASSIUM CHLORIDE CRYS ER 20 MEQ PO TBCR: 20.0000 meq | EXTENDED_RELEASE_TABLET | Freq: Once | ORAL | Status: AC | PRN

## 2012-08-22 MED ORDER — OXYCODONE HCL 5 MG PO TABS: 5.0000 mg | ORAL_TABLET | Freq: Four times a day (QID) | ORAL | Status: DC | PRN

## 2012-08-22 MED ORDER — ASPIRIN 325 MG PO TABS
325.0000 mg | ORAL_TABLET | Freq: Every day | ORAL | Status: DC
  Administered 2012-08-23: 325 mg via ORAL
  Filled 2012-08-22: qty 1

## 2012-08-22 MED ORDER — HYDRALAZINE HCL 20 MG/ML IJ SOLN: 10.0000 mg | INTRAMUSCULAR | Status: DC | PRN

## 2012-08-22 MED ORDER — OXYCODONE HCL 5 MG PO TABS
5.0000 mg | ORAL_TABLET | ORAL | Status: DC | PRN
  Administered 2012-08-22 (×2): 5 mg via ORAL
  Filled 2012-08-22 (×2): qty 1

## 2012-08-22 MED ORDER — ONDANSETRON HCL 4 MG/2ML IJ SOLN: 4.0000 mg | Freq: Four times a day (QID) | INTRAMUSCULAR | Status: DC | PRN

## 2012-08-22 MED ORDER — PROPOFOL 10 MG/ML IV BOLUS
INTRAVENOUS | Status: DC | PRN
  Administered 2012-08-22: 150 mg via INTRAVENOUS
  Administered 2012-08-22: 50 mg via INTRAVENOUS

## 2012-08-22 MED ORDER — IODIXANOL 320 MG/ML IV SOLN
INTRAVENOUS | Status: DC | PRN
  Administered 2012-08-22: 150 mL via INTRA_ARTERIAL

## 2012-08-22 MED ORDER — ONE-DAILY MULTI VITAMINS PO TABS: 1.0000 | ORAL_TABLET | Freq: Every day | ORAL | Status: DC

## 2012-08-22 MED ORDER — DOCUSATE SODIUM 100 MG PO CAPS
100.0000 mg | ORAL_CAPSULE | Freq: Every day | ORAL | Status: DC
  Administered 2012-08-23: 100 mg via ORAL
  Filled 2012-08-22: qty 1

## 2012-08-22 MED ORDER — GLYCOPYRROLATE 0.2 MG/ML IJ SOLN
INTRAMUSCULAR | Status: DC | PRN
  Administered 2012-08-22: 0.6 mg via INTRAVENOUS

## 2012-08-22 MED ORDER — SODIUM CHLORIDE 0.9 % IV SOLN
INTRAVENOUS | Status: DC
  Administered 2012-08-22 – 2012-08-23 (×2): via INTRAVENOUS

## 2012-08-22 MED ORDER — HEPARIN SODIUM (PORCINE) 1000 UNIT/ML IJ SOLN
INTRAMUSCULAR | Status: DC | PRN
  Administered 2012-08-22 (×2): 5000 [IU] via INTRAVENOUS

## 2012-08-22 MED ORDER — PROTAMINE SULFATE 10 MG/ML IV SOLN
INTRAVENOUS | Status: DC | PRN
  Administered 2012-08-22: 40 mg via INTRAVENOUS
  Administered 2012-08-22: 10 mg via INTRAVENOUS
  Administered 2012-08-22 (×2): 25 mg via INTRAVENOUS

## 2012-08-22 MED ORDER — ENOXAPARIN SODIUM 30 MG/0.3ML ~~LOC~~ SOLN
30.0000 mg | SUBCUTANEOUS | Status: DC
  Filled 2012-08-22: qty 0.3

## 2012-08-22 MED ORDER — ONDANSETRON HCL 4 MG/2ML IJ SOLN
INTRAMUSCULAR | Status: DC | PRN
  Administered 2012-08-22: 4 mg via INTRAVENOUS

## 2012-08-22 MED ORDER — SODIUM CHLORIDE 0.9 % IV SOLN
10.0000 mg | INTRAVENOUS | Status: DC | PRN
  Administered 2012-08-22: 20 ug/min via INTRAVENOUS

## 2012-08-22 MED ORDER — LABETALOL HCL 5 MG/ML IV SOLN
10.0000 mg | INTRAVENOUS | Status: DC | PRN
  Filled 2012-08-22: qty 4

## 2012-08-22 MED ORDER — METOPROLOL TARTRATE 1 MG/ML IV SOLN
INTRAVENOUS | Status: DC | PRN
  Administered 2012-08-22: 2 mg via INTRAVENOUS
  Administered 2012-08-22: 1 mg via INTRAVENOUS

## 2012-08-22 MED ORDER — OXYCODONE HCL 5 MG/5ML PO SOLN: 5.0000 mg | Freq: Once | ORAL | Status: DC | PRN

## 2012-08-22 MED ORDER — DOPAMINE-DEXTROSE 3.2-5 MG/ML-% IV SOLN: 3.0000 ug/kg/min | INTRAVENOUS | Status: DC

## 2012-08-22 MED ORDER — ZOLPIDEM TARTRATE 5 MG PO TABS
5.0000 mg | ORAL_TABLET | Freq: Every evening | ORAL | Status: DC | PRN
  Administered 2012-08-22: 5 mg via ORAL
  Filled 2012-08-22: qty 1

## 2012-08-22 MED ORDER — ACETAMINOPHEN 650 MG RE SUPP: 325.0000 mg | RECTAL | Status: DC | PRN

## 2012-08-22 MED ORDER — DEXTROSE 5 % IV SOLN
1.5000 g | Freq: Two times a day (BID) | INTRAVENOUS | Status: AC
  Administered 2012-08-22 – 2012-08-23 (×2): 1.5 g via INTRAVENOUS
  Filled 2012-08-22 (×2): qty 1.5

## 2012-08-22 MED ORDER — FENTANYL CITRATE 0.05 MG/ML IJ SOLN
INTRAMUSCULAR | Status: DC | PRN
  Administered 2012-08-22: 150 ug via INTRAVENOUS
  Administered 2012-08-22: 100 ug via INTRAVENOUS
  Administered 2012-08-22 (×2): 50 ug via INTRAVENOUS

## 2012-08-22 MED ORDER — ADULT MULTIVITAMIN W/MINERALS CH
1.0000 | ORAL_TABLET | Freq: Every day | ORAL | Status: DC
Start: 2012-08-23 — End: 2012-08-23
  Administered 2012-08-23: 1 via ORAL
  Filled 2012-08-22: qty 1

## 2012-08-22 MED ORDER — MORPHINE SULFATE 2 MG/ML IJ SOLN: 2.0000 mg | INTRAMUSCULAR | Status: DC | PRN

## 2012-08-22 MED ORDER — METOPROLOL TARTRATE 1 MG/ML IV SOLN: 2.0000 mg | INTRAVENOUS | Status: DC | PRN

## 2012-08-22 MED ORDER — GUAIFENESIN-DM 100-10 MG/5ML PO SYRP: 15.0000 mL | ORAL_SOLUTION | ORAL | Status: DC | PRN

## 2012-08-22 MED ORDER — HYDROMORPHONE HCL PF 1 MG/ML IJ SOLN: 0.2500 mg | INTRAMUSCULAR | Status: DC | PRN

## 2012-08-22 MED ORDER — EPHEDRINE SULFATE 50 MG/ML IJ SOLN
INTRAMUSCULAR | Status: DC | PRN
  Administered 2012-08-22: 5 mg via INTRAVENOUS
  Administered 2012-08-22: 10 mg via INTRAVENOUS
  Administered 2012-08-22 (×2): 5 mg via INTRAVENOUS

## 2012-08-22 MED ORDER — HEPARIN SODIUM (PORCINE) 5000 UNIT/ML IJ SOLN
INTRAMUSCULAR | Status: DC | PRN
  Administered 2012-08-22: 08:00:00

## 2012-08-22 MED ORDER — PHENOL 1.4 % MT LIQD: 1.0000 | OROMUCOSAL | Status: DC | PRN

## 2012-08-22 MED ORDER — SODIUM CHLORIDE 0.9 % IV SOLN: INTRAVENOUS | Status: DC

## 2012-08-22 MED ORDER — ROCURONIUM BROMIDE 100 MG/10ML IV SOLN
INTRAVENOUS | Status: DC | PRN
  Administered 2012-08-22 (×2): 50 mg via INTRAVENOUS

## 2012-08-22 MED ORDER — HYDRALAZINE HCL 20 MG/ML IJ SOLN
INTRAMUSCULAR | Status: DC | PRN
  Administered 2012-08-22: 8 mg via INTRAVENOUS

## 2012-08-22 MED ORDER — PANTOPRAZOLE SODIUM 40 MG PO TBEC
40.0000 mg | DELAYED_RELEASE_TABLET | Freq: Every day | ORAL | Status: DC
  Administered 2012-08-22 – 2012-08-23 (×2): 40 mg via ORAL
  Filled 2012-08-22 (×2): qty 1

## 2012-08-22 MED ORDER — 0.9 % SODIUM CHLORIDE (POUR BTL) OPTIME
TOPICAL | Status: DC | PRN
  Administered 2012-08-22: 1000 mL

## 2012-08-22 MED ORDER — SODIUM CHLORIDE 0.9 % IV SOLN: 500.0000 mL | Freq: Once | INTRAVENOUS | Status: AC | PRN

## 2012-08-22 MED ORDER — OXYCODONE HCL 5 MG PO TABS: 5.0000 mg | ORAL_TABLET | Freq: Once | ORAL | Status: DC | PRN

## 2012-08-22 MED ORDER — NEOSTIGMINE METHYLSULFATE 1 MG/ML IJ SOLN
INTRAMUSCULAR | Status: DC | PRN
  Administered 2012-08-22: 4 mg via INTRAVENOUS

## 2012-08-22 MED ORDER — ALUM & MAG HYDROXIDE-SIMETH 200-200-20 MG/5ML PO SUSP: 15.0000 mL | ORAL | Status: DC | PRN

## 2012-08-22 MED ORDER — ACETAMINOPHEN 325 MG PO TABS: 325.0000 mg | ORAL_TABLET | ORAL | Status: DC | PRN

## 2012-08-22 MED ORDER — OMEGA-3-ACID ETHYL ESTERS 1 G PO CAPS
1.0000 g | ORAL_CAPSULE | Freq: Two times a day (BID) | ORAL | Status: DC
  Administered 2012-08-22 – 2012-08-23 (×3): 1 g via ORAL
  Filled 2012-08-22 (×4): qty 1

## 2012-08-22 SURGICAL SUPPLY — 70 items
ADH SKN CLS APL DERMABOND .7 (GAUZE/BANDAGES/DRESSINGS) ×1
BAG BANDED W/RUBBER/TAPE 36X54 (MISCELLANEOUS) ×2 IMPLANT
BAG EQP BAND 135X91 W/RBR TAPE (MISCELLANEOUS) ×1
BAG SNAP BAND KOVER 36X36 (MISCELLANEOUS) ×2 IMPLANT
CANISTER SUCTION 2500CC (MISCELLANEOUS) ×2 IMPLANT
CATH BEACON 5.038 65CM KMP-01 (CATHETERS) ×1 IMPLANT
CATH OMNI FLUSH .035X70CM (CATHETERS) ×1 IMPLANT
CLIP TI MEDIUM 24 (CLIP) IMPLANT
CLIP TI WIDE RED SMALL 24 (CLIP) IMPLANT
CLOTH BEACON ORANGE TIMEOUT ST (SAFETY) ×2 IMPLANT
COVER DOME SNAP 22 D (MISCELLANEOUS) ×2 IMPLANT
COVER MAYO STAND STRL (DRAPES) ×2 IMPLANT
COVER PROBE W GEL 5X96 (DRAPES) ×2 IMPLANT
COVER SURGICAL LIGHT HANDLE (MISCELLANEOUS) ×2 IMPLANT
DERMABOND ADVANCED (GAUZE/BANDAGES/DRESSINGS) ×1
DERMABOND ADVANCED .7 DNX12 (GAUZE/BANDAGES/DRESSINGS) ×1 IMPLANT
DEVICE CLOSURE PERCLS PRGLD 6F (VASCULAR PRODUCTS) IMPLANT
DEVICE TORQUE KENDALL .025-038 (MISCELLANEOUS) ×1 IMPLANT
DRAPE TABLE COVER HEAVY DUTY (DRAPES) ×2 IMPLANT
DRESSING OPSITE X SMALL 2X3 (GAUZE/BANDAGES/DRESSINGS) ×3 IMPLANT
DRYSEAL FLEXSHEATH 12FR 33CM (SHEATH) ×1
DRYSEAL FLEXSHEATH 18FR 33CM (SHEATH) ×1
ELECT CAUTERY BLADE 6.4 (BLADE) ×2 IMPLANT
ELECT REM PT RETURN 9FT ADLT (ELECTROSURGICAL) ×4
ELECTRODE REM PT RTRN 9FT ADLT (ELECTROSURGICAL) ×2 IMPLANT
EXCLUDER TRUNK (Endovascular Graft) ×1 IMPLANT
GAUZE SPONGE 2X2 8PLY STRL LF (GAUZE/BANDAGES/DRESSINGS) IMPLANT
GLOVE BIO SURGEON STRL SZ7.5 (GLOVE) ×2 IMPLANT
GOWN PREVENTION PLUS XLARGE (GOWN DISPOSABLE) ×3 IMPLANT
GOWN STRL NON-REIN LRG LVL3 (GOWN DISPOSABLE) ×5 IMPLANT
GRAFT BALLN CATH 65CM (STENTS) IMPLANT
GRAFT EXCLUDER LEG 14.5X14 (Endovascular Graft) ×1 IMPLANT
GUIDEWIRE AMPLATZ STIFF 0.35 (WIRE) ×2 IMPLANT
GUIDEWIRE ANGLED .035X150CM (WIRE) ×1 IMPLANT
KIT BASIN OR (CUSTOM PROCEDURE TRAY) ×2 IMPLANT
KIT ROOM TURNOVER OR (KITS) ×2 IMPLANT
NDL PERC 18GX7CM (NEEDLE) ×1 IMPLANT
NEEDLE PERC 18GX7CM (NEEDLE) ×2 IMPLANT
NS IRRIG 1000ML POUR BTL (IV SOLUTION) ×2 IMPLANT
PACK AORTA (CUSTOM PROCEDURE TRAY) ×2 IMPLANT
PAD ARMBOARD 7.5X6 YLW CONV (MISCELLANEOUS) ×4 IMPLANT
PENCIL BUTTON HOLSTER BLD 10FT (ELECTRODE) IMPLANT
PERCLOSE PROGLIDE 6F (VASCULAR PRODUCTS) ×14
PROTECTION STATION PRESSURIZED (MISCELLANEOUS) ×2
SHEATH AVANTI 11CM 8FR (MISCELLANEOUS) ×1 IMPLANT
SHEATH BRITE TIP 8FR 23CM (MISCELLANEOUS) ×1 IMPLANT
SHEATH DRYSEAL FLEX 12FR 33CM (SHEATH) IMPLANT
SHEATH DRYSEAL FLEX 18FR 33CM (SHEATH) IMPLANT
SPONGE GAUZE 2X2 STER 10/PKG (GAUZE/BANDAGES/DRESSINGS) ×1
SPONGE SURGIFOAM ABS GEL 100 (HEMOSTASIS) IMPLANT
STAPLER VISISTAT 35W (STAPLE) IMPLANT
STATION PROTECTION PRESSURIZED (MISCELLANEOUS) IMPLANT
STENT GRAFT BALLN CATH 65CM (STENTS)
STOPCOCK MORSE 400PSI 3WAY (MISCELLANEOUS) ×2 IMPLANT
SUT PROLENE 5 0 C 1 24 (SUTURE) IMPLANT
SUT VIC AB 2-0 CT1 27 (SUTURE)
SUT VIC AB 2-0 CT1 TAPERPNT 27 (SUTURE) IMPLANT
SUT VIC AB 3-0 SH 27 (SUTURE)
SUT VIC AB 3-0 SH 27X BRD (SUTURE) IMPLANT
SUT VICRYL 4-0 PS2 18IN ABS (SUTURE) ×4 IMPLANT
SYR 20CC LL (SYRINGE) ×4 IMPLANT
SYR 30ML LL (SYRINGE) IMPLANT
SYR 5ML LL (SYRINGE) ×2 IMPLANT
SYR MEDRAD MARK V 150ML (SYRINGE) ×2 IMPLANT
SYRINGE 10CC LL (SYRINGE) ×6 IMPLANT
TOWEL OR 17X24 6PK STRL BLUE (TOWEL DISPOSABLE) ×4 IMPLANT
TOWEL OR 17X26 10 PK STRL BLUE (TOWEL DISPOSABLE) ×5 IMPLANT
TRAY FOLEY CATH 14FRSI W/METER (CATHETERS) ×2 IMPLANT
TUBING HIGH PRESSURE 120CM (CONNECTOR) ×2 IMPLANT
WIRE BENTSON .035X145CM (WIRE) ×2 IMPLANT

## 2012-08-22 NOTE — Preoperative (Signed)
Beta Blockers   Reason not to administer Beta Blockers:Not Applicable 

## 2012-08-22 NOTE — Op Note (Signed)
Procedure: Blake Cherry excluder stent graft treatment of infrarenal AAA  Operative findings:   #1 Bilateral Proglide closure   #2 26 x12x18 cm main body Gore Excluder device delivered via a right femoral system   #3 14 x 14 cm left iliac limb contralateral  Asst: Samantha Rhyne PA-C   PROCEDURE DETAIL: After obtaining informed consent the patient was taken to the operating room. The patient was placed in supine position the operating room table. After induction of general anesthesia and endotracheal intubation a Foley catheter was placed. Next the patient was prepped and draped in usual sterile fashion from the nipples down to the knees. Ultrasound was used to identify the right common femoral artery as well as the femoral bifurcation. An 11 blade was used to make a small neck in the skin over the level of the right common femoral artery. An introducer needle was then used to cannulate the right common femoral artery without difficulty. A 0.035 Bentson wire was then threaded up into the abdominal aorta through the right femoral system. A short 9 French dilator was placed over the guidewire the right femoral system. This was used to dilate the tract. The dilator was then removed and a Proglide device inserted over the guidewire into the right femoral system and this was deployed at the 2:00 position. The Proglide device was then removed and an additional Proglide device was brought in operative field and deployed at the 10:00 position. The sutures were kept separate and tagged with suture tags. Next the short 9 French sheath was then placed back over the guidewire into the right femoral system and the dilator removed and the sheath thoroughly flushed with heparinized saline. Attention was then turned to the left groin. Again using ultrasound the left common femoral artery was identified. The femoral bifurcation was also identified. A small nick was made in the skin with the 11 blade. A hemostat was used to dilate  a tract down to the artery. An introducer needle was then used to cannulate the left common femoral artery without difficulty. A 0.035 Bentson wire was then threaded up into the abdominal aorta under fluoroscopic guidance. A 9 French dilator was then placed over the wire to dilate the tract. Two Proglide devices were again brought on operative field and these were fired sequentially in the 10:00 position followed by an additional Proglide at the 2:00 position. The 10 o'clock suture broke on the first deployment so an additional device was fired over the wire at 10 o'clock.  The Proglide delivery systems were removed and the long 9 French sheath placed back over the guidewire up to the level of the iliac of the aortic bifurcation.  At this point, a 5 [place] Omni flush catheter was placed over the guidewire and the right groin up and the abdominal aorta. An abdominal aortogram was obtained in the AP position to determine level of the left and right renal arteries. At this point a 26 x 12 x 18 Gore Excluder main body device was selected. The Bentson wire from the leftt groin was advanced up into the descending thoracic aorta over a Kumpe catheter and the Bentsen wire replaced with an 035 Amplatz wire. The main body device was then placed over the Amplatz wire in the left groin and advanced up to the level of the renal arteries. There was some intitial resistance at the skin level and the skin nick was made larger.  Magnified views of the renal arteries were performed to make sure that  we were not covering these. The top portion of the stent graft was then deployed with the end of the stent just below the level of the right renal artery. There were bilateral inferior pole renal arteries which were small and these were intentionally covered to make sure we achieved a seal.  The main body was delivered all the way down to the contralateral gate. Attention was then turned to the right groin. The Omni flush catheter was  pulled down over a guidewire and removed and the Bentson wire placed in position to cannulate the contralateral gate. The Kumpe catheter was placed back over the guidewire in the right femoral system. A 5 [place] Kumpe catheter was placed over this and this was used to selectively catheterize the contralateral gate an 035 angled glidewire was placed through this and the wire was then advanced into the descending thoracic aorta. The main body portion of the gate was confirmed by twirling the pigtail catheter. The pigtail  catheter was then placed in a location so that we could use its markers to determine the exact length to the iliac bifurcation. An Amplatz wire was placed back through the pigtail catheter. A retrograde contrast angiogram was performed to determine the level of the left internal iliac artery and a 14 x 14 cm iliac limb was selected. The pigtail catheter was removed over the guidewire and the 14 x 14 cm limb advanced so there was full coverage of the long marker on the contralateral limb. This was then deployed in the usual fashion down to the iliac bifurcation. The delivery system was then removed. The remainder of the ipsilateral iliac limb was also deployed.  A retrograde contrast angiogram was also performed to make sure that the left iliac limb did not cover the left internal iliac artery.   Attention was then turned back to the left iliac system and a Gore aortic balloon placed over the wire up to the level of the proximal end of the stent and this was ballooned to profile. The limb attachment site was also ballooned as well as the distal attachment site. Attention was then turned to the right groin and the balloon was advanced over the guidewire on the right side and the distal attachment site as well as the midportion of iliac limb were also ballooned. The 5 [place] Omni flush catheter was then placed back to the guidewire on the right side and a completion arteriogram was obtained. This showed  no evidence of proximal or distal type I endoleak. There was no type II endoleak noted although there was retrograde filling of two small lumbar arteries. There is no filling of the aneurysm sac.    At this point the Omni flush catheter was removed over a guidewire. All delivery devices were removed. We then proceeded to remove the large 18 French sheath from the left side with the guidewire in place. With pressure held above and below the insertion site the lateral Proglide closure was secured down.  The medial device was also cinched down.  There was good hemostasis. The guidewire was removed from the left side. Attention was then turned to the right groin. In similar fashion the 12 French sheath was removed and the guidewire left in place. It was noticed that the medial Proglide suture had broken on this side. The lateral Proglide was secured and 2 additional Proglides were deployed over the wire to obtain hemostasis.  There was good hemostasis and the Bentsen wire was removed from the left  groin. The patient had been given 10000 units of heparin before introducing the main body device. This was fully reversed with 100 mg of protamine at the end of the case. Each groin puncture site was then closed with a running 4-0 Vicryl subcuticular stitch.  The patient tolerated procedure well and there were no complications. Instrument sponge and needle counts correct in the case. Patient was awakened in the operating room extubated and taken to the recovery room in stable condition.  The patient's feet were pink in color with good doppler signals at the end of the case.  Fabienne Bruns, MD [facility] [place] Office: [phone] Pager: [phone]

## 2012-08-22 NOTE — Anesthesia Preprocedure Evaluation (Signed)
Anesthesia Evaluation  Patient identified by MRN, date of birth, ID band Patient awake    Reviewed: Allergy & Precautions, H&P , NPO status , Patient's Chart, lab work & pertinent test results  Airway Mallampati: II  Neck ROM: full    Dental   Pulmonary shortness of breath, Current Smoker,          Cardiovascular hypertension, + Peripheral Vascular Disease     Neuro/Psych CVA, Residual Symptoms    GI/Hepatic   Endo/Other    Renal/GU      Musculoskeletal   Abdominal   Peds  Hematology   Anesthesia Other Findings   Reproductive/Obstetrics                           Anesthesia Physical Anesthesia Plan  ASA: III  Anesthesia Plan: General   Post-op Pain Management:    Induction: Intravenous  Airway Management Planned: Oral ETT  Additional Equipment: Arterial line  Intra-op Plan:   Post-operative Plan: Extubation in OR  Informed Consent: I have reviewed the patients History and Physical, chart, labs and discussed the procedure including the risks, benefits and alternatives for the proposed anesthesia with the patient or authorized representative who has indicated his/her understanding and acceptance.     Plan Discussed with: CRNA, Anesthesiologist and Surgeon  Anesthesia Plan Comments:         Anesthesia Quick Evaluation

## 2012-08-22 NOTE — Anesthesia Postprocedure Evaluation (Signed)
Anesthesia Post Note  Patient: Blake Cherry  Procedure(s) Performed: Procedure(s) (LRB): ABDOMINAL AORTIC ENDOVASCULAR STENT GRAFT (N/A)  Anesthesia type: General  Patient location: PACU  Post pain: Pain level controlled and Adequate analgesia  Post assessment: Post-op Vital signs reviewed, Patient's Cardiovascular Status Stable, Respiratory Function Stable, Patent Airway and Pain level controlled  Last Vitals:  Filed Vitals:   08/22/12 1028  BP: 122/63  Pulse: 77  Temp:   Resp: 11    Post vital signs: Reviewed and stable  Level of consciousness: awake, alert  and oriented  Complications: No apparent anesthesia complications

## 2012-08-22 NOTE — Transfer of Care (Signed)
Immediate Anesthesia Transfer of Care Note  Patient: Blake Cherry  Procedure(s) Performed: Procedure(s): ABDOMINAL AORTIC ENDOVASCULAR STENT GRAFT (N/A)  Patient Location: PACU  Anesthesia Type:General  Level of Consciousness: patient cooperative and responds to stimulation  Airway & Oxygen Therapy: Patient Spontanous Breathing and Patient connected to face mask oxygen  Post-op Assessment: Report given to PACU RN, Post -op Vital signs reviewed and stable and Patient moving all extremities X 4  Post vital signs: Reviewed and stable  Complications: No apparent anesthesia complications

## 2012-08-22 NOTE — Progress Notes (Signed)
The patient has been re-examined.  The patient's history and physical has been reviewed and is unchanged.  There is no change in the plan of care.  Giordana Weinheimer, MD 

## 2012-08-22 NOTE — Progress Notes (Signed)
Awake and alert SBP 160 2+ DP pulses bilat No hematoma  Will d/c foley now Probable d/c am  May need BP med at discharge  Fabienne Bruns, MD [facility] [place] Office: [phone] Pager: [phone]

## 2012-08-23 LAB — BASIC METABOLIC PANEL
BUN: 11 mg/dL (ref 6–23)
CO2: 24 mEq/L (ref 19–32)
Calcium: 8.3 mg/dL — ABNORMAL LOW (ref 8.4–10.5)
Chloride: 100 mEq/L (ref 96–112)
Creatinine, Ser: 1.19 mg/dL (ref 0.50–1.35)
GFR calc Af Amer: 72 mL/min — ABNORMAL LOW (ref >90)
GFR calc non Af Amer: 62 mL/min — ABNORMAL LOW (ref >90)
Glucose, Bld: 108 mg/dL — ABNORMAL HIGH (ref 70–99)
Potassium: 4.4 mEq/L (ref 3.5–5.1)
Sodium: 132 mEq/L — ABNORMAL LOW (ref 135–145)

## 2012-08-23 LAB — CBC
HCT: 44.4 % (ref 39.0–52.0)
Hemoglobin: 15.2 g/dL (ref 13.0–17.0)
MCH: 32.8 pg (ref 26.0–34.0)
MCHC: 34.2 g/dL (ref 30.0–36.0)
MCV: 95.9 fL (ref 78.0–100.0)
Platelets: 222 10*3/uL (ref 150–400)
RBC: 4.63 MIL/uL (ref 4.22–5.81)
RDW: 13.2 % (ref 11.5–15.5)
WBC: 12.8 10*3/uL — ABNORMAL HIGH (ref 4.0–10.5)

## 2012-08-23 MED ORDER — METOPROLOL TARTRATE 12.5 MG HALF TABLET: 12.5000 mg | ORAL_TABLET | Freq: Two times a day (BID) | ORAL | Status: DC

## 2012-08-23 MED ORDER — METOPROLOL TARTRATE 12.5 MG HALF TABLET
12.5000 mg | ORAL_TABLET | Freq: Two times a day (BID) | ORAL | Status: DC
  Administered 2012-08-23: 12.5 mg via ORAL
  Filled 2012-08-23 (×2): qty 1

## 2012-08-23 MED ORDER — ATORVASTATIN CALCIUM 10 MG PO TABS: 10.0000 mg | ORAL_TABLET | Freq: Every day | ORAL | Status: DC

## 2012-08-23 NOTE — Progress Notes (Addendum)
[facility] Progress Note  08/23/2012 7:19 AM 1 Day Post-Op  Subjective:  No complaints this am  Afebrile HR 66-90's 130's-160's 94% 2LO2NC  Filed Vitals:   08/23/12 0405  BP: 158/78  Pulse: 96  Temp: 98.4 F (36.9 C)  Resp: 26    Physical Exam: Incisions:  Bilateral groins soft with mild ecchymosis Extremities:  Palpable pedal pulses bilaterally Abdomen:  Soft NT/ND Lungs:  Non-labored.  CBC    Component Value Date/Time   WBC 12.8* 08/23/2012 0515   RBC 4.63 08/23/2012 0515   HGB 15.2 08/23/2012 0515   HCT 44.4 08/23/2012 0515   PLT 222 08/23/2012 0515   MCV 95.9 08/23/2012 0515   MCH 32.8 08/23/2012 0515   MCHC 34.2 08/23/2012 0515   RDW 13.2 08/23/2012 0515   LYMPHSABS 1.7 09/12/2008 0440   MONOABS 1.0 09/12/2008 0440   EOSABS 0.2 09/12/2008 0440   BASOSABS 0.0 09/12/2008 0440    BMET    Component Value Date/Time   NA 132* 08/23/2012 0515   K 4.4 08/23/2012 0515   CL 100 08/23/2012 0515   CO2 24 08/23/2012 0515   GLUCOSE 108* 08/23/2012 0515   BUN 11 08/23/2012 0515   CREATININE 1.19 08/23/2012 0515   CREATININE 1.07 05/05/2012 1413   CALCIUM 8.3* 08/23/2012 0515   GFRNONAA 62* 08/23/2012 0515   GFRAA 72* 08/23/2012 0515    INR    Component Value Date/Time   INR 1.02 08/22/2012 1330     Intake/Output Summary (Last 24 hours) at 08/23/12 0719 Last data filed at 08/23/12 0600  Gross per 24 hour  Intake   2400 ml  Output   2800 ml  Net   -400 ml     Assessment/Plan:  67 y.o. male is s/p:  Gore excluder stent graft treatment of infrarenal AAA   1 Day Post-Op  -pt doing well this am-has voided and ambulated. -BUN/Cr are stable -DVT prophylaxis:  Lovenox ordered for later this am. -discharge home  -f/u with Dr. Darrick Penna in 4 weeks with CTA -will start pt on beta blocker as his BP has been elevated and HR will tolerate. -Discussed with pt to f/u with Dr. Gerda Diss (PCP in [place]) in the next week or two to f/u on BP.  He states he has a BP cuff at home and is  going to record his BP twice a day and take the log with him to Dr. Gerda Diss.   Doreatha Massed, PA-C [facility] [phone] 08/23/2012 7:19 AM  Agree with above.  No hematoma.  Palpable pedal pulses no abdominal pain Will d/c home Hypertensive yesterday and overnight will start beta blocker  Fabienne Bruns, MD [facility] [place] Office: [phone] Pager: [phone]

## 2012-08-23 NOTE — Progress Notes (Signed)
Utilization review completed. Kadar Chance, RN, BSN. 

## 2012-08-23 NOTE — Progress Notes (Signed)
Discussed discharge instructions and medications with patient and wife. Both verbalized understanding with all questions answered. VSS. Pt discharged home with wife  Brealyn Baril,RN  

## 2012-08-23 NOTE — Discharge Summary (Signed)
[facility] Discharge Summary  Blake Cherry 14-Jan-1946 67 y.o. male  [id number]  Admission Date: 08/22/2012  Discharge Date: 08/23/12  Physician: Sherren Kerns, MD  Admission Diagnosis: AAA   HPI:   This is a 67 y.o. male patient followed for known AAA. This was 4.7 cm in August of 2013. Recent ultrasound suggested that the aneurysm had grown to over 5 cm. The patient was scheduled for a CT angiogram. He returns today for further followup. The patient denies any unusual abdominal or back pain. The patient denies any new medical diagnoses or recent surgery.  Hospital Course:  The patient was admitted to the hospital and taken to the operating room on 08/22/2012 and underwent: Gore excluder stent graft treatment of infrarenal AAA    The pt tolerated the procedure well and was transported to the PACU in good condition. Intraoperatively, the pt was hypertensive at times.   He has remained hypertensive post operatively with systolic in the range of 130's-170.  He is started on low dose beta blocker.   By POD 1, he is doing well.  He has ambulated and voided.  The remainder of the hospital course consisted of increasing mobilization and increasing intake of solids without difficulty.  CBC    Component Value Date/Time   WBC 12.8* 08/23/2012 0515   RBC 4.63 08/23/2012 0515   HGB 15.2 08/23/2012 0515   HCT 44.4 08/23/2012 0515   PLT 222 08/23/2012 0515   MCV 95.9 08/23/2012 0515   MCH 32.8 08/23/2012 0515   MCHC 34.2 08/23/2012 0515   RDW 13.2 08/23/2012 0515   LYMPHSABS 1.7 09/12/2008 0440   MONOABS 1.0 09/12/2008 0440   EOSABS 0.2 09/12/2008 0440   BASOSABS 0.0 09/12/2008 0440    BMET    Component Value Date/Time   NA 132* 08/23/2012 0515   K 4.4 08/23/2012 0515   CL 100 08/23/2012 0515   CO2 24 08/23/2012 0515   GLUCOSE 108* 08/23/2012 0515   BUN 11 08/23/2012 0515   CREATININE 1.19 08/23/2012 0515   CREATININE 1.07 05/05/2012 1413   CALCIUM 8.3* 08/23/2012 0515   GFRNONAA  62* 08/23/2012 0515   GFRAA 72* 08/23/2012 0515     Discharge Instructions:   The patient is discharged to home with extensive instructions on wound care and progressive ambulation.  They are instructed not to drive or perform any heavy lifting until returning to see the physician in his office.   Future Appointments Provider Department Dept Phone   09/22/2012 1:30 PM Sherren Kerns, MD [facility] [phone]      Discharge Diagnosis:  AAA  Secondary Diagnosis: Patient Active Problem List   Diagnosis Date Noted  . Abdominal aneurysm without mention of rupture 10/20/2011   Past Medical History  Diagnosis Date  . AAA (abdominal aortic aneurysm)   . Carotid artery occlusion 2007    Left cea  . Stroke 05/2005     2 in 2007.  Weakness and numbness in left arm and leg  . Diverticulitis     last time 4 years ago  . Hypertension     per Dr Tenny Craw  . Shortness of breath     with exertion       Medication List         aspirin 325 MG tablet  Take 325 mg by mouth daily.     atorvastatin 10 MG tablet  Commonly known as:  LIPITOR  Take 1 tablet (10  mg total) by mouth daily.     Fish Oil 1000 MG Caps  Take 1,000 mg by mouth 2 (two) times daily.     metoprolol tartrate 12.5 mg Tabs  Commonly known as:  LOPRESSOR  Take 0.5 tablets (12.5 mg total) by mouth 2 (two) times daily.     multivitamin tablet  Take 1 tablet by mouth daily.     oxyCODONE 5 MG immediate release tablet  Commonly known as:  ROXICODONE  Take 1 tablet (5 mg total) by mouth every 6 (six) hours as needed for pain.        Roxicodone #30 No Refill  Disposition: home  Patient's condition: is Good  Follow up: 1. Dr. Darrick Penna in 4 weeks with CTA 2. Dr. Gerda Diss in 1-2 weeks for hypertension and medical management   Doreatha Massed, PA-C [facility] [phone] 08/23/2012  7:31 AM   - For VQI Registry use --- Instructions: Press F2 to tab through  selections.  Delete question if not applicable.   Post-op:  Time to Extubation: [ x] In OR, [ ]  < 12 hrs, [ ]  12-24 hrs, [ ]  >=24 hrs Vasopressors Req. Post-op: No MI: no, [ ]  Troponin only, [ ]  EKG or Clinical New Arrhythmia: No CHF: No ICU Stay: 1 day in stepdown Transfusion: No  If yes, n/a units given  Complications: Resp failure: no, [ ]  Pneumonia, [ ]  Ventilator Chg in renal function: no, [ ]  Inc. Cr > 0.5, [ ]  Temp. Dialysis, [ ]  Permanent dialysis Leg ischemia: no, no Surgery needed, [ ]  Yes, Surgery needed, [ ]  Amputation Bowel ischemia: no, [ ]  Medical Rx, [ ]  Surgical Rx Wound complication: no, [ ]  Superficial separation/infection, [ ]  Return to OR Return to OR: No  Return to OR for bleeding: No Stroke: no, [ ]  Minor, [ ]  Major  Discharge medications: Statin use:  Yes If No: [ ]  For Medical reasons, [ ]  Non-compliant, [ ]  Not-indicated ASA use:  Yes  If No: [ ]  For Medical reasons, [ ]  Non-compliant, [ ]  Not-indicated Plavix use:  No If No: [ ]  For Medical reasons, [ ]  Non-compliant, [ ]  Not-indicated Beta blocker use:  Yes If No: [ ]  For Medical reasons, [ ]  Non-compliant, [ ]  Not-indicated

## 2012-08-24 ENCOUNTER — Telehealth: Payer: Self-pay | Admitting: Vascular Surgery

## 2012-08-24 ENCOUNTER — Encounter (HOSPITAL_COMMUNITY): Payer: Self-pay | Admitting: Vascular Surgery

## 2012-08-24 NOTE — Telephone Encounter (Addendum)
Message copied by Rosalyn Charters on Wed Aug 24, 2012 11:47 AM ------      Message from: Lorin Mercy K      Created: Mon Aug 22, 2012 11:05 AM      Regarding: schedule                   ----- Message -----         From: Dara Lords, PA-C         Sent: 08/22/2012  10:00 AM           To: Sharee Pimple, CMA            S/p [facility] 08/22/12.  F/u with Dr. Darrick Penna in 4 weeks with CTA protocol for [facility].            Thanks,      Lelon Mast ------  notified patient of fu appt. on 09-22-12 12:00 ct at [facility] and 1:30 with dr. Darrick Penna

## 2012-08-29 ENCOUNTER — Encounter: Payer: Self-pay | Admitting: *Deleted

## 2012-09-02 ENCOUNTER — Ambulatory Visit (INDEPENDENT_AMBULATORY_CARE_PROVIDER_SITE_OTHER): Payer: Medicare Other | Admitting: Family Medicine

## 2012-09-02 ENCOUNTER — Encounter: Payer: Self-pay | Admitting: Family Medicine

## 2012-09-02 VITALS — BP 128/82 | HR 70 | Wt 204.0 lb

## 2012-09-02 DIAGNOSIS — F17200 Nicotine dependence, unspecified, uncomplicated: Secondary | ICD-10-CM | POA: Insufficient documentation

## 2012-09-02 DIAGNOSIS — I10 Essential (primary) hypertension: Secondary | ICD-10-CM

## 2012-09-02 DIAGNOSIS — Z720 Tobacco use: Secondary | ICD-10-CM

## 2012-09-02 DIAGNOSIS — E785 Hyperlipidemia, unspecified: Secondary | ICD-10-CM

## 2012-09-02 MED ORDER — LISINOPRIL 5 MG PO TABS: 5.0000 mg | ORAL_TABLET | Freq: Every day | ORAL | Status: DC

## 2012-09-02 NOTE — Progress Notes (Signed)
  Subjective:    Patient ID: Blake Cherry, male    DOB: 12-04-1945, 67 y.o.   MRN: [id number]  HPIHere for a follow up. No concerns. Had abdominal aortic surgery. Patient had stent in place to help with aortic aneurysm. He tolerated procedure well was released one day later. He has had problems with blood pressure mean elevated on multiple different occasions. He has a blood pressure monitor her that goes on his wrist that monitors his blood pressure. He denies any fever chills or sweats. Denies any rectal bleeding hematuria or. He still smokes she knows he needs to quit. He is down to 6 cigarettes a day. He does relate compliance her trying eat healthy and take his cholesterol medicine. He also takes his blood pressure medicine as well/heart medicine. Past medical history heart disease along with recent aortic aneurysm and hyperlipidemia  Review of Systems See above    Objective:   Physical Exam Blood pressure was elevated on are checked today with 148/86 neck no masses lungs clear no crackles heart is regular pulse normal abdomen soft extremities no edema skin warm dry       Assessment & Plan:  #1 hyperlipidemia continue Lipitor continue watching diet #2 tobacco abuse patient counseled about quitting smoking #3 hypertension add lisinopril 5 mg a day check lab work in approximately 2 weeks followup here in approximately 4 weeks bring blood pressure monitor with him.

## 2012-09-02 NOTE — Patient Instructions (Signed)
DASH Diet  The DASH diet stands for "Dietary Approaches to Stop Hypertension." It is a healthy eating plan that has been shown to reduce high blood pressure (hypertension) in as little as 14 days, while also possibly providing other significant health benefits. These other health benefits include reducing the risk of breast cancer after menopause and reducing the risk of type 2 diabetes, heart disease, colon cancer, and stroke. Health benefits also include weight loss and slowing kidney failure in patients with chronic kidney disease.   DIET GUIDELINES  · Limit salt (sodium). Your diet should contain less than 1500 mg of sodium daily.  · Limit refined or processed carbohydrates. Your diet should include mostly whole grains. Desserts and added sugars should be used sparingly.  · Include small amounts of heart-healthy fats. These types of fats include nuts, oils, and tub margarine. Limit saturated and trans fats. These fats have been shown to be harmful in the body.  CHOOSING FOODS   The following food groups are based on a 2000 calorie diet. See your Registered Dietitian for individual calorie needs.  Grains and Grain Products (6 to 8 servings daily)  · Eat More Often: Whole-wheat bread, brown rice, whole-grain or wheat pasta, quinoa, popcorn without added fat or salt (air popped).  · Eat Less Often: White bread, white pasta, white rice, cornbread.  Vegetables (4 to 5 servings daily)  · Eat More Often: Fresh, frozen, and canned vegetables. Vegetables may be raw, steamed, roasted, or grilled with a minimal amount of fat.  · Eat Less Often/Avoid: Creamed or fried vegetables. Vegetables in a cheese sauce.  Fruit (4 to 5 servings daily)  · Eat More Often: All fresh, canned (in natural juice), or frozen fruits. Dried fruits without added sugar. One hundred percent fruit juice (½ cup [237 mL] daily).  · Eat Less Often: Dried fruits with added sugar. Canned fruit in light or heavy syrup.  Lean Meats, Fish, and Poultry (2  servings or less daily. One serving is 3 to 4 oz [85-114 g]).  · Eat More Often: Ninety percent or leaner ground beef, tenderloin, sirloin. Round cuts of beef, chicken breast, turkey breast. All fish. Grill, bake, or broil your meat. Nothing should be fried.  · Eat Less Often/Avoid: Fatty cuts of meat, turkey, or chicken leg, thigh, or wing. Fried cuts of meat or fish.  Dairy (2 to 3 servings)  · Eat More Often: Low-fat or fat-free milk, low-fat plain or light yogurt, reduced-fat or part-skim cheese.  · Eat Less Often/Avoid: Milk (whole, 2%). Whole milk yogurt. Full-fat cheeses.  Nuts, Seeds, and Legumes (4 to 5 servings per week)  · Eat More Often: All without added salt.  · Eat Less Often/Avoid: Salted nuts and seeds, canned beans with added salt.  Fats and Sweets (limited)  · Eat More Often: Vegetable oils, tub margarines without trans fats, sugar-free gelatin. Mayonnaise and salad dressings.  · Eat Less Often/Avoid: Coconut oils, palm oils, butter, stick margarine, cream, half and half, cookies, candy, pie.  FOR MORE INFORMATION  The Dash Diet Eating Plan: www.dashdiet.org  Document Released: 01/29/2011 Document Revised: 05/04/2011 Document Reviewed: 01/29/2011  ExitCare® Patient Information ©2014 [employer].

## 2012-09-17 LAB — HEPATIC FUNCTION PANEL
ALT: 9 U/L (ref 0–53)
AST: 18 U/L (ref 0–37)
Albumin: 3.8 g/dL (ref 3.5–5.2)
Alkaline Phosphatase: 80 U/L (ref 39–117)
Bilirubin, Direct: 0.1 mg/dL (ref 0.0–0.3)
Indirect Bilirubin: 0.4 mg/dL (ref 0.0–0.9)
Total Bilirubin: 0.5 mg/dL (ref 0.3–1.2)
Total Protein: 7.1 g/dL (ref 6.0–8.3)

## 2012-09-17 LAB — BASIC METABOLIC PANEL
BUN: 15 mg/dL (ref 6–23)
CO2: 28 mEq/L (ref 19–32)
Calcium: 9.1 mg/dL (ref 8.4–10.5)
Chloride: 101 mEq/L (ref 96–112)
Creat: 1.38 mg/dL — ABNORMAL HIGH (ref 0.50–1.35)
Glucose, Bld: 133 mg/dL — ABNORMAL HIGH (ref 70–99)
Potassium: 4.9 mEq/L (ref 3.5–5.3)
Sodium: 139 mEq/L (ref 135–145)

## 2012-09-17 LAB — LIPID PANEL
Cholesterol: 98 mg/dL (ref 0–200)
HDL: 36 mg/dL — ABNORMAL LOW (ref >39)
LDL Cholesterol: 43 mg/dL (ref 0–99)
Total CHOL/HDL Ratio: 2.7 Ratio
Triglycerides: 93 mg/dL (ref <150)
VLDL: 19 mg/dL (ref 0–40)

## 2012-09-21 ENCOUNTER — Encounter: Payer: Self-pay | Admitting: Vascular Surgery

## 2012-09-22 ENCOUNTER — Encounter: Payer: Self-pay | Admitting: Vascular Surgery

## 2012-09-22 ENCOUNTER — Ambulatory Visit
Admission: RE | Admit: 2012-09-22 | Discharge: 2012-09-22 | Disposition: A | Discharge status: Home / Self Care | Payer: Medicare Other | Source: Ambulatory Visit | Attending: Vascular Surgery | Admitting: Vascular Surgery

## 2012-09-22 ENCOUNTER — Ambulatory Visit (INDEPENDENT_AMBULATORY_CARE_PROVIDER_SITE_OTHER): Payer: Medicare Other | Admitting: Vascular Surgery

## 2012-09-22 VITALS — BP 154/74 | HR 68 | Temp 98.3°F | Resp 18 | Ht 68.0 in | Wt 206.0 lb

## 2012-09-22 DIAGNOSIS — Z48812 Encounter for surgical aftercare following surgery on the circulatory system: Secondary | ICD-10-CM | POA: Insufficient documentation

## 2012-09-22 DIAGNOSIS — I714 Abdominal aortic aneurysm, without rupture: Secondary | ICD-10-CM

## 2012-09-22 MED ORDER — IOHEXOL 350 MG/ML SOLN
80.0000 mL | Freq: Once | INTRAVENOUS | Status: AC | PRN
  Administered 2012-09-22: 80 mL via INTRAVENOUS

## 2012-09-22 NOTE — Addendum Note (Signed)
Addended by: Sharee Pimple on: 09/22/2012 03:51 PM   Modules accepted: Orders

## 2012-09-22 NOTE — Progress Notes (Signed)
[facility] [place] HISTORY AND PHYSICAL    History of Present Illness:  Patient is a 67 y.o.67 y.o. year old male who presents for follow-up evaluation of AAA. He underwent Gore Excluder aneurysm stent graft repair one month ago. The patient denies new abdominal or back pain.  The patient's atherosclerotic risk factors remain peripheral arterial disease and hypertension.  These are all currently stable and followed by his primary care physician. He has return to normal activities  Past Medical History  Diagnosis Date  . AAA (abdominal aortic aneurysm)   . Carotid artery occlusion 2007    Left cea  . Stroke 05/2005     2 in 2007.  Weakness and numbness in left arm and leg  . Diverticulitis     last time 4 years ago  . Hypertension     per Dr Tenny Craw  . Shortness of breath     with exertion     Past Surgical History  Procedure Laterality Date  . Carotid stent  2007    left internal carotid artery  . Carotid endarterectomy Left 2007  . Testicle torsion reduction      age of 32  . Abdominal aortic endovascular stent graft N/A 08/22/2012    Procedure: ABDOMINAL AORTIC ENDOVASCULAR STENT GRAFT;  Surgeon: Sherren Kerns, MD;  Location: [facility] OR;  Service: Vascular;  Laterality: N/A;       Review of Systems:  Neurologic: denies symptoms of TIA, amaurosis, or stroke Cardiac:denies shortness of breath or chest pain Pulmonary: denies cough or wheeze Abdomen: denies abdominal pain nausea or vomiting  History   Social History  . Marital Status: Married    Spouse Name: N/A    Number of Children: N/A  . Years of Education: N/A   Occupational History  . Not on file.   Social History Main Topics  . Smoking status: Current Every Day Smoker -- 0.25 packs/day for 50 years    Types: Cigarettes  . Smokeless tobacco: Former Neurosurgeon    Types: Snuff  . Alcohol Use: No  . Drug Use: No  . Sexually Active: Not on file   Other Topics Concern  . Not on file   Social  History Narrative  . No narrative on file    No Known Allergies  Current Outpatient Prescriptions on File Prior to Visit  Medication Sig Dispense Refill  . aspirin 325 MG tablet Take 325 mg by mouth daily.      Marland Kitchen atorvastatin (LIPITOR) 10 MG tablet Take 1 tablet (10 mg total) by mouth daily.  30 tablet  2  . lisinopril (PRINIVIL,ZESTRIL) 5 MG tablet Take 1 tablet (5 mg total) by mouth daily.  30 tablet  11  . metoprolol tartrate (LOPRESSOR) 12.5 mg TABS Take 0.5 tablets (12.5 mg total) by mouth 2 (two) times daily.  60 tablet  2  . Multiple Vitamin (MULTIVITAMIN) tablet Take 1 tablet by mouth daily.      . Omega-3 Fatty Acids (FISH OIL) 1000 MG CAPS Take 1,000 mg by mouth 2 (two) times daily.      Marland Kitchen oxyCODONE (ROXICODONE) 5 MG immediate release tablet Take 1 tablet (5 mg total) by mouth every 6 (six) hours as needed for pain.  30 tablet  0   No current facility-administered medications on file prior to visit.       Physical Examination    Filed Vitals:   09/22/12 1318  BP: 154/74  Pulse: 68  Temp: 98.3 F (36.8  C)  TempSrc: Oral  Resp: 18  Height: 5\' 8"  (1.727 m)  Weight: 206 lb (93.441 kg)  SpO2: 99%     General:  Alert and oriented, no acute distress HEENT: Normal Neck: No bruit or JVD Pulmonary: Clear to auscultation bilaterally Cardiac: Regular Rate and Rhythm without murmur Abdomen: Soft, non-tender, non-distended, normal bowel sounds, no pulsatile mass Extremities: 2+ femoral pulses, 1+ dorsalis pedis pulses   DATA:  CT angiogram the abdomen and pelvis images were reviewed today.  Current aneurysm diameter is  5.1.  There no evidence of endoleak. The top portion of the stent graft is adjacent to the renal arteries and there is no evidence of migration.  There is suggestion of some compression of one of the limbs of the stent graft that I did not appreciate this in my evaluation of the images.  Coverage of an accessory renal artery was noted.   ASSESSMENT:   Doing well status post Gore Excluder stent graft repair aneurysm   PLAN: Patient will return in 5 months to review his stent graft with repeat CT Angio  Fabienne Bruns, MD [facility] [place] Office: [phone] Pager: [phone]

## 2012-09-26 ENCOUNTER — Encounter: Payer: Self-pay | Admitting: Family Medicine

## 2012-09-26 ENCOUNTER — Ambulatory Visit (INDEPENDENT_AMBULATORY_CARE_PROVIDER_SITE_OTHER): Payer: Medicare Other | Admitting: Family Medicine

## 2012-09-26 VITALS — BP 108/68 | HR 70 | Ht 67.0 in | Wt 205.0 lb

## 2012-09-26 DIAGNOSIS — N289 Disorder of kidney and ureter, unspecified: Secondary | ICD-10-CM

## 2012-09-26 DIAGNOSIS — R739 Hyperglycemia, unspecified: Secondary | ICD-10-CM

## 2012-09-26 DIAGNOSIS — E669 Obesity, unspecified: Secondary | ICD-10-CM

## 2012-09-26 DIAGNOSIS — R7309 Other abnormal glucose: Secondary | ICD-10-CM

## 2012-09-26 DIAGNOSIS — I10 Essential (primary) hypertension: Secondary | ICD-10-CM

## 2012-09-26 LAB — POCT GLYCOSYLATED HEMOGLOBIN (HGB A1C): Hemoglobin A1C: 5.7

## 2012-09-26 MED ORDER — LISINOPRIL 2.5 MG PO TABS: 2.5000 mg | ORAL_TABLET | Freq: Every day | ORAL | Status: DC

## 2012-09-26 NOTE — Progress Notes (Signed)
  Subjective:    Patient ID: Blake Cherry, male    DOB: 1945/02/25, 67 y.o.   MRN: [id number]  HPIPatient here to follow up on his blood pressure. He brought in his blood pressure machine. On his machine BP was 144/79.  Patient does try to watch his diet to some degree. He states he could do a better job. He does try to stay active. Blood sugar elevated on blood work. A1C done today in office. PMH stroke, obesity, hyperlipidemia, tobacco abuse patient knows that he should quit   Review of Systems He denies any shortness breath wheezing discomfort or swelling in the legs    Objective:   Physical Exam Blood pressure is mildly low on recheck it is also low 104/70 lungs are clear hearts regular extremities no edema       Assessment & Plan:  Relative hypotension early renal insufficiency. Recommend reducing lisinopril. 2.5 mg one every morning. Check metabolic 7 in 4 weeks. Followup office visit in 3-4 months. Encourage patient to quit smoking. Keep all else as it is

## 2012-09-28 ENCOUNTER — Other Ambulatory Visit: Payer: Self-pay

## 2012-11-28 ENCOUNTER — Telehealth: Payer: Self-pay | Admitting: Family Medicine

## 2012-11-28 MED ORDER — ATORVASTATIN CALCIUM 10 MG PO TABS: 10.0000 mg | ORAL_TABLET | Freq: Every day | ORAL | Status: DC

## 2012-11-28 NOTE — Telephone Encounter (Signed)
Notified patient that low dose Lipitor is still a good idea to reduce risk of stroke and heart attack. RX sent to pharmacy.

## 2012-11-28 NOTE — Telephone Encounter (Signed)
Patient needs Rx for generic Lipitor if Dr Lorin Picket thinks he should continue taking it?   [facility] in [place]   Please call when complete.

## 2012-11-28 NOTE — Telephone Encounter (Signed)
Low dose Lipitor is still a good idea to reduce risk of stroke and heart attack. I would recommend 10 mg daily. #34 refills. By early spring he needs to repeat lab work.

## 2012-12-29 ENCOUNTER — Other Ambulatory Visit: Payer: Self-pay

## 2012-12-30 ENCOUNTER — Encounter: Payer: Self-pay | Admitting: Nurse Practitioner

## 2012-12-30 ENCOUNTER — Ambulatory Visit (INDEPENDENT_AMBULATORY_CARE_PROVIDER_SITE_OTHER): Payer: Medicare Other | Admitting: Nurse Practitioner

## 2012-12-30 VITALS — BP 138/80 | Temp 100.7°F | Ht 67.0 in | Wt 201.8 lb

## 2012-12-30 DIAGNOSIS — J209 Acute bronchitis, unspecified: Secondary | ICD-10-CM

## 2012-12-30 DIAGNOSIS — J069 Acute upper respiratory infection, unspecified: Secondary | ICD-10-CM

## 2012-12-30 DIAGNOSIS — R062 Wheezing: Secondary | ICD-10-CM

## 2012-12-30 DIAGNOSIS — R197 Diarrhea, unspecified: Secondary | ICD-10-CM

## 2012-12-30 MED ORDER — ALBUTEROL SULFATE (5 MG/ML) 0.5% IN NEBU
2.5000 mg | INHALATION_SOLUTION | Freq: Once | RESPIRATORY_TRACT | Status: AC
  Administered 2012-12-30: 2.5 mg via RESPIRATORY_TRACT

## 2012-12-30 MED ORDER — ALBUTEROL SULFATE HFA 108 (90 BASE) MCG/ACT IN AERS: 2.0000 | INHALATION_SPRAY | RESPIRATORY_TRACT | Status: DC | PRN

## 2012-12-30 MED ORDER — PREDNISONE 20 MG PO TABS: ORAL_TABLET | ORAL | Status: DC

## 2012-12-30 MED ORDER — CEFTRIAXONE SODIUM 1 G IJ SOLR
1.0000 g | Freq: Once | INTRAMUSCULAR | Status: AC
  Administered 2012-12-30: 1 g via INTRAMUSCULAR

## 2012-12-30 MED ORDER — LEVOFLOXACIN 500 MG PO TABS: 500.0000 mg | ORAL_TABLET | Freq: Every day | ORAL | Status: DC

## 2013-01-01 ENCOUNTER — Encounter: Payer: Self-pay | Admitting: Nurse Practitioner

## 2013-01-01 NOTE — Progress Notes (Signed)
Subjective:  Presents complaints of cough and congestion over the past 5 days. Fever and chills. Headache. Frequent cough producing yellow mucus at times with slight bad taste. Runny nose. Wheezing and shortness of breath at times. Sore throat. No ear pain. Bodyaches. Slight nausea, no vomiting. No abdominal pain. Having 7-8 watery stools per day. No blood noted. Decreased appetite but taking fluids well. Voiding normal limit. Long-term smoker.  Objective:   BP 138/80  Temp(Src) 100.7 F (38.2 C) (Oral)  Ht 5\' 7"  (1.702 m)  Wt 201 lb 12.8 oz (91.536 kg)  BMI 31.60 kg/m2 NAD. Alert, oriented. TMs clear effusion, no erythema. Pharynx erythematous with PND noted. Neck supple with mild soft nontender adenopathy. Initially slight pursed lip breathing noted. No tachypnea or patient unable to take a deep breath without frequent spasmodic cough. Slightly diminished breath sounds in general but difficulty with initial exam due to inability to take a deep breath. Given albuterol 2.5 mg nebulizer treatment. Airflow much improved with faint scattered expiratory crackles. No wheezing. Less pursed lip breathing noted, cough much improved. Some subjective improvement in symptoms. Heart regular rate rhythm.  Assessment:Wheezing - Plan: albuterol (PROVENTIL) (5 MG/ML) 0.5% nebulizer solution 2.5 mg  Acute upper respiratory infections of unspecified site - Plan: cefTRIAXone (ROCEPHIN) injection 1 g  Acute bronchitis - Plan: cefTRIAXone (ROCEPHIN) injection 1 g  Diarrhea  Plan: Meds ordered this encounter  Medications  . albuterol (PROVENTIL) (5 MG/ML) 0.5% nebulizer solution 2.5 mg    Sig:   . predniSONE (DELTASONE) 20 MG tablet    Sig: 3 po qd x 3 d then 2 po qd x 3 d then 1 po qd x 3 d    Dispense:  18 tablet    Refill:  0    Order Specific Question:  Supervising Provider    Answer:  Merlyn Albert [[id number]]  . albuterol (PROVENTIL HFA;VENTOLIN HFA) 108 (90 BASE) MCG/ACT inhaler    Sig: Inhale 2 puffs  into the lungs every 4 (four) hours as needed for wheezing or shortness of breath.    Dispense:  1 Inhaler    Refill:  0    Order Specific Question:  Supervising Provider    Answer:  Merlyn Albert [[id number]]  . levofloxacin (LEVAQUIN) 500 MG tablet    Sig: Take 1 tablet (500 mg total) by mouth daily. Start 11/8    Dispense:  10 tablet    Refill:  0    Order Specific Question:  Supervising Provider    Answer:  Merlyn Albert [[id number]]  . cefTRIAXone (ROCEPHIN) injection 1 g    Sig:    Given prescription for a spacer for his inhaler.Feel the patient will have difficulty taking a deep breath without use of spacer. OTC meds as directed for congestion and cough. Warning signs reviewed. Call back in 72 hours if no improvement, go to ED over the weekend if worse.

## 2013-01-03 ENCOUNTER — Encounter: Payer: Self-pay | Admitting: Nurse Practitioner

## 2013-01-10 LAB — BASIC METABOLIC PANEL
BUN: 18 mg/dL (ref 6–23)
CO2: 28 mEq/L (ref 19–32)
Calcium: 8.6 mg/dL (ref 8.4–10.5)
Chloride: 104 mEq/L (ref 96–112)
Creat: 1.13 mg/dL (ref 0.50–1.35)
Glucose, Bld: 145 mg/dL — ABNORMAL HIGH (ref 70–99)
Potassium: 4.7 mEq/L (ref 3.5–5.3)
Sodium: 135 mEq/L (ref 135–145)

## 2013-01-11 ENCOUNTER — Ambulatory Visit (INDEPENDENT_AMBULATORY_CARE_PROVIDER_SITE_OTHER): Payer: Medicare Other | Admitting: Family Medicine

## 2013-01-11 ENCOUNTER — Encounter: Payer: Self-pay | Admitting: Family Medicine

## 2013-01-11 VITALS — BP 118/72 | Ht 67.0 in | Wt 207.0 lb

## 2013-01-11 DIAGNOSIS — E785 Hyperlipidemia, unspecified: Secondary | ICD-10-CM

## 2013-01-11 DIAGNOSIS — Z720 Tobacco use: Secondary | ICD-10-CM

## 2013-01-11 DIAGNOSIS — I10 Essential (primary) hypertension: Secondary | ICD-10-CM

## 2013-01-11 DIAGNOSIS — Z23 Encounter for immunization: Secondary | ICD-10-CM

## 2013-01-11 DIAGNOSIS — R7309 Other abnormal glucose: Secondary | ICD-10-CM

## 2013-01-11 DIAGNOSIS — F17200 Nicotine dependence, unspecified, uncomplicated: Secondary | ICD-10-CM

## 2013-01-11 DIAGNOSIS — R7303 Prediabetes: Secondary | ICD-10-CM

## 2013-01-11 LAB — POCT GLYCOSYLATED HEMOGLOBIN (HGB A1C): Hemoglobin A1C: 5.9

## 2013-01-11 MED ORDER — SULFAMETHOXAZOLE-TMP DS 800-160 MG PO TABS: 1.0000 | ORAL_TABLET | Freq: Two times a day (BID) | ORAL | Status: DC

## 2013-01-11 NOTE — Progress Notes (Signed)
  Subjective:    Patient ID: Blake Cherry, male    DOB: [birth year], 67 y.o.   MRN: [id number]  HPI Patient is here today for 3 month follow up.    He had his blood work done on 11/17. No strokes symptoms Pt also still c/o bronchitis.  Still with congestion , sob, no V no fever pmh- see notes  Review of Systems No bloody stools No tingling No chest pain occas SOB    Objective:   Physical Exam  Vitals reviewed. Constitutional: He appears well-developed and well-nourished.  HENT:  Right Ear: External ear normal.  Left Ear: External ear normal.  Neck: Normal range of motion. Neck supple.  Cardiovascular: Normal rate, regular rhythm and normal heart sounds.   No murmur heard. Pulmonary/Chest: Effort normal and breath sounds normal. No respiratory distress. He has no wheezes.  Abdominal: Soft.  Musculoskeletal: He exhibits no edema.  Lymphadenopathy:    He has no cervical adenopathy.          Assessment & Plan:  #1 HTN decent control currently #2 renal insufficiency actually improved recheck lab work in the spring time. #3 aortic aneurysm followup with specialist in January as already planned he is hard he had a stent placed. #4 hyperlipidemia under good control labs in the spring #5 tobacco prevention discussed #6 has mild persistent infection antibiotics prescribed call us at problems pneumonia vaccine today

## 2013-02-09 ENCOUNTER — Encounter: Payer: Self-pay | Admitting: Family Medicine

## 2013-02-09 ENCOUNTER — Ambulatory Visit (INDEPENDENT_AMBULATORY_CARE_PROVIDER_SITE_OTHER): Payer: Medicare Other | Admitting: Family Medicine

## 2013-02-09 VITALS — BP 146/80 | Temp 98.0°F | Ht 67.0 in | Wt 208.0 lb

## 2013-02-09 DIAGNOSIS — J209 Acute bronchitis, unspecified: Secondary | ICD-10-CM

## 2013-02-09 MED ORDER — ALBUTEROL SULFATE HFA 108 (90 BASE) MCG/ACT IN AERS: 2.0000 | INHALATION_SPRAY | RESPIRATORY_TRACT | Status: DC | PRN

## 2013-02-09 MED ORDER — PREDNISONE 20 MG PO TABS: ORAL_TABLET | ORAL | Status: DC

## 2013-02-09 MED ORDER — HYDROCODONE-HOMATROPINE 5-1.5 MG/5ML PO SYRP: 5.0000 mL | ORAL_SOLUTION | Freq: Four times a day (QID) | ORAL | Status: DC | PRN

## 2013-02-09 MED ORDER — LEVOFLOXACIN 500 MG PO TABS: 500.0000 mg | ORAL_TABLET | Freq: Every day | ORAL | Status: AC

## 2013-02-09 NOTE — Progress Notes (Signed)
   Subjective:    Patient ID: Blake Cherry, male    DOB: [birth year], 67 y.o.   MRN: [id number]  Cough This is a recurrent problem. The current episode started in the past 7 days. The cough is productive of sputum. Associated symptoms include nasal congestion and wheezing. He has tried steroid inhaler for the symptoms. The treatment provided mild relief. His past medical history is significant for bronchitis.   Patient still smokes knows needs quit COPD stable  Review of Systems  Respiratory: Positive for cough and wheezing.        Objective:   Physical Exam Eardrums normal throat is normal mucous membranes moist neck is supple lungs are clear no crackles heart regular       Assessment & Plan:  Probable acute bronchitis secondary bacterial infection antibiotics recommended, they were prescribed. Warning signs discussed. Yard he has albuterol. In addition to this also medication prednisone written just in case he needs it

## 2013-02-27 ENCOUNTER — Other Ambulatory Visit: Payer: Self-pay | Admitting: Vascular Surgery

## 2013-02-27 LAB — CREATININE, SERUM: Creat: 1.27 mg/dL (ref 0.50–1.35)

## 2013-02-27 LAB — BUN: BUN: 14 mg/dL (ref 6–23)

## 2013-03-01 ENCOUNTER — Encounter: Payer: Self-pay | Admitting: Vascular Surgery

## 2013-03-02 ENCOUNTER — Encounter (HOSPITAL_COMMUNITY): Payer: Self-pay

## 2013-03-02 ENCOUNTER — Ambulatory Visit (INDEPENDENT_AMBULATORY_CARE_PROVIDER_SITE_OTHER): Payer: Medicare Other | Admitting: Vascular Surgery

## 2013-03-02 ENCOUNTER — Ambulatory Visit
Admission: RE | Admit: 2013-03-02 | Discharge: 2013-03-02 | Disposition: A | Discharge status: Home / Self Care | Payer: Medicare Other | Source: Ambulatory Visit | Attending: Vascular Surgery | Admitting: Vascular Surgery

## 2013-03-02 ENCOUNTER — Encounter: Payer: Self-pay | Admitting: Vascular Surgery

## 2013-03-02 VITALS — BP 157/69 | HR 69 | Resp 16 | Ht 67.0 in | Wt 208.0 lb

## 2013-03-02 DIAGNOSIS — Z48812 Encounter for surgical aftercare following surgery on the circulatory system: Secondary | ICD-10-CM

## 2013-03-02 DIAGNOSIS — I714 Abdominal aortic aneurysm, without rupture, unspecified: Secondary | ICD-10-CM

## 2013-03-02 MED ORDER — IOHEXOL 350 MG/ML SOLN
75.0000 mL | Freq: Once | INTRAVENOUS | Status: AC | PRN
  Administered 2013-03-02: 75 mL via INTRAVENOUS

## 2013-03-02 NOTE — Progress Notes (Signed)
[facility] [place] HISTORY AND PHYSICAL    History of Present Illness:  Patient is a 68 y.o.68 y.o. year old male who presents for follow-up evaluation of AAA. He underwent Gore Excluder aneurysm stent graft repair June 2014. The patient denies new abdominal or back pain.  The patient's atherosclerotic risk factors remain peripheral arterial disease and hypertension.  These are all currently stable and followed by his primary care physician. He has returned to normal activities.  He denies any claudication symptoms. He has occasional numbness in his left leg due to prior stroke.    Past Medical History   Diagnosis  Date   .  AAA (abdominal aortic aneurysm)     .  Carotid artery occlusion  2007       Left cea   .  Stroke  05/2005        2 in 2007.  Weakness and numbness in left arm and leg   .  Diverticulitis         last time 4 years ago   .  Hypertension         per Dr Tenny Crawoss   .  Shortness of breath         with exertion       Past Surgical History   Procedure  Laterality  Date   .  Carotid stent    2007       left internal carotid artery   .  Carotid endarterectomy  Left  2007   .  Testicle torsion reduction           age of 68   .  Abdominal aortic endovascular stent graft  N/A  08/22/2012       Procedure: ABDOMINAL AORTIC ENDOVASCULAR STENT GRAFT;  Surgeon: Sherren Kernsharles E Fields, MD;  [facility];  Service: Vascular;  Laterality: N/A;         Review of Systems:  Neurologic: denies symptoms of TIA, amaurosis, or stroke Cardiac:denies shortness of breath or chest pain Pulmonary: denies cough or wheeze Abdomen: denies abdominal pain nausea or vomiting    History      Social History   .  Marital Status:  Married       Spouse Name:  N/A       Number of Children:  N/A   .  Years of Education:  N/A      Occupational History   .  Not on file.      Social History Main Topics   .  Smoking status:  Current Every Day Smoker -- 0.25 packs/day for 50 years        Types:  Cigarettes   .  Smokeless tobacco:  Former NeurosurgeonUser       Types:  Snuff   .  Alcohol Use:  No   .  Drug Use:  No   .  Sexually Active:  Not on file      Other Topics  Concern   .  Not on file      Social History Narrative   .  No narrative on file     No Known Allergies    Current Outpatient Prescriptions on File Prior to Visit   Medication  Sig  Dispense  Refill   .  aspirin 325 MG tablet  Take 325 mg by mouth daily.         Marland Kitchen.  atorvastatin (LIPITOR) 10 MG tablet  Take 1 tablet (10 mg total)  by mouth daily.   30 tablet   2   .  lisinopril (PRINIVIL,ZESTRIL) 5 MG tablet  Take 1 tablet (5 mg total) by mouth daily.   30 tablet   11   .  metoprolol tartrate (LOPRESSOR) 12.5 mg TABS  Take 0.5 tablets (12.5 mg total) by mouth 2 (two) times daily.   60 tablet   2   .  Multiple Vitamin (MULTIVITAMIN) tablet  Take 1 tablet by mouth daily.         .  Omega-3 Fatty Acids (FISH OIL) 1000 MG CAPS  Take 1,000 mg by mouth 2 (two) times daily.         Marland Kitchen  oxyCODONE (ROXICODONE) 5 MG immediate release tablet  Take 1 tablet (5 mg total) by mouth every 6 (six) hours as needed for pain.   30 tablet   0      No current facility-administered medications on file prior to visit.         Physical Examination     Filed Vitals:   03/02/13 1231  BP: 157/69  Pulse: 69  Resp: 16  Height: 5\' 7"  (1.702 m)  Weight: 208 lb (94.348 kg)     General:  Alert and oriented, no acute distress HEENT: Normal Neck: No bruit or JVD Pulmonary: Clear to auscultation bilaterally Cardiac: Regular Rate and Rhythm without murmur Abdomen: Soft, non-tender, non-distended, normal bowel sounds, no pulsatile mass Extremities: 2+ femoral pulse right 1-2+ left femoral pulse, 1+ dorsalis pedis pulses bilaterally   DATA:   CT angiogram the abdomen and pelvis images were reviewed today.  Current aneurysm diameter is  4.7cm.  There no evidence of endoleak. The top portion of the stent graft is adjacent to the renal  arteries and there is no evidence of migration.  There is suggestion of some compression of the left limb of the stent graft. This was noted on his prior CT as well.  ASSESSMENT:   Doing well status post Gore Excluder stent graft repair aneurysm possible compression of the left limb of his stent graft. Which is slightly diminished left femoral pulse I believe it warrants further investigation.   PLAN: Aortogram possible angioplasty 03/10/2012. Risks benefits possible complications and procedure details were explained the patient today. He understands and agrees to proceed.  Fabienne Bruns, MD [facility] [place] Office: [phone] Pager: [phone]

## 2013-03-03 ENCOUNTER — Other Ambulatory Visit: Payer: Self-pay

## 2013-03-09 MED ORDER — SODIUM CHLORIDE 0.9 % IV SOLN
INTRAVENOUS | Status: DC
  Administered 2013-03-10: 08:00:00 via INTRAVENOUS

## 2013-03-10 ENCOUNTER — Encounter (HOSPITAL_COMMUNITY)
Admission: RE | Disposition: A | Discharge status: Home / Self Care | Payer: Self-pay | Source: Ambulatory Visit | Attending: Vascular Surgery

## 2013-03-10 ENCOUNTER — Other Ambulatory Visit: Payer: Self-pay | Admitting: *Deleted

## 2013-03-10 ENCOUNTER — Ambulatory Visit (HOSPITAL_COMMUNITY)
Admission: RE | Admit: 2013-03-10 | Discharge: 2013-03-10 | Disposition: A | Discharge status: Home / Self Care | Payer: Medicare Other | Source: Ambulatory Visit | Attending: Vascular Surgery | Admitting: Vascular Surgery

## 2013-03-10 DIAGNOSIS — I739 Peripheral vascular disease, unspecified: Secondary | ICD-10-CM

## 2013-03-10 DIAGNOSIS — I10 Essential (primary) hypertension: Secondary | ICD-10-CM | POA: Insufficient documentation

## 2013-03-10 DIAGNOSIS — Y831 Surgical operation with implant of artificial internal device as the cause of abnormal reaction of the patient, or of later complication, without mention of misadventure at the time of the procedure: Secondary | ICD-10-CM | POA: Insufficient documentation

## 2013-03-10 DIAGNOSIS — I714 Abdominal aortic aneurysm, without rupture, unspecified: Secondary | ICD-10-CM | POA: Insufficient documentation

## 2013-03-10 DIAGNOSIS — R0602 Shortness of breath: Secondary | ICD-10-CM | POA: Insufficient documentation

## 2013-03-10 DIAGNOSIS — F17200 Nicotine dependence, unspecified, uncomplicated: Secondary | ICD-10-CM | POA: Insufficient documentation

## 2013-03-10 DIAGNOSIS — Z8673 Personal history of transient ischemic attack (TIA), and cerebral infarction without residual deficits: Secondary | ICD-10-CM | POA: Insufficient documentation

## 2013-03-10 DIAGNOSIS — T82898A Other specified complication of vascular prosthetic devices, implants and grafts, initial encounter: Secondary | ICD-10-CM

## 2013-03-10 DIAGNOSIS — I6529 Occlusion and stenosis of unspecified carotid artery: Secondary | ICD-10-CM | POA: Insufficient documentation

## 2013-03-10 DIAGNOSIS — Z7982 Long term (current) use of aspirin: Secondary | ICD-10-CM | POA: Insufficient documentation

## 2013-03-10 DIAGNOSIS — Z48812 Encounter for surgical aftercare following surgery on the circulatory system: Secondary | ICD-10-CM

## 2013-03-10 HISTORY — PX: ABDOMINAL AORTAGRAM: SHX5454

## 2013-03-10 LAB — POCT I-STAT, CHEM 8
BUN: 14 mg/dL (ref 6–23)
Calcium, Ion: 1.24 mmol/L (ref 1.13–1.30)
Chloride: 101 mEq/L (ref 96–112)
Creatinine, Ser: 1.4 mg/dL — ABNORMAL HIGH (ref 0.50–1.35)
Glucose, Bld: 91 mg/dL (ref 70–99)
HCT: 55 % — ABNORMAL HIGH (ref 39.0–52.0)
Hemoglobin: 18.7 g/dL — ABNORMAL HIGH (ref 13.0–17.0)
Potassium: 4.4 mEq/L (ref 3.7–5.3)
Sodium: 140 mEq/L (ref 137–147)
TCO2: 26 mmol/L (ref 0–100)

## 2013-03-10 LAB — POCT ACTIVATED CLOTTING TIME: Activated Clotting Time: 160 seconds

## 2013-03-10 SURGERY — ABDOMINAL AORTAGRAM
Anesthesia: LOCAL

## 2013-03-10 MED ORDER — ACETAMINOPHEN 325 MG RE SUPP: 325.0000 mg | RECTAL | Status: DC | PRN

## 2013-03-10 MED ORDER — HYDRALAZINE HCL 20 MG/ML IJ SOLN: 10.0000 mg | INTRAMUSCULAR | Status: DC | PRN

## 2013-03-10 MED ORDER — METOPROLOL TARTRATE 1 MG/ML IV SOLN: 2.0000 mg | INTRAVENOUS | Status: DC | PRN

## 2013-03-10 MED ORDER — HEPARIN (PORCINE) IN NACL 2-0.9 UNIT/ML-% IJ SOLN
INTRAMUSCULAR | Status: AC
  Filled 2013-03-10: qty 1000

## 2013-03-10 MED ORDER — ONDANSETRON HCL 4 MG/2ML IJ SOLN: 4.0000 mg | Freq: Four times a day (QID) | INTRAMUSCULAR | Status: DC | PRN

## 2013-03-10 MED ORDER — ACETAMINOPHEN 325 MG PO TABS: 325.0000 mg | ORAL_TABLET | ORAL | Status: DC | PRN

## 2013-03-10 MED ORDER — LABETALOL HCL 5 MG/ML IV SOLN: 10.0000 mg | INTRAVENOUS | Status: DC | PRN

## 2013-03-10 MED ORDER — GUAIFENESIN-DM 100-10 MG/5ML PO SYRP: 15.0000 mL | ORAL_SOLUTION | ORAL | Status: DC | PRN

## 2013-03-10 MED ORDER — SODIUM CHLORIDE 0.45 % IV SOLN
INTRAVENOUS | Status: DC
  Administered 2013-03-10: 625 mL via INTRAVENOUS

## 2013-03-10 MED ORDER — MORPHINE SULFATE 10 MG/ML IJ SOLN: 2.0000 mg | INTRAMUSCULAR | Status: DC | PRN

## 2013-03-10 MED ORDER — PHENOL 1.4 % MT LIQD: 1.0000 | OROMUCOSAL | Status: DC | PRN

## 2013-03-10 MED ORDER — HEPARIN SODIUM (PORCINE) 1000 UNIT/ML IJ SOLN
INTRAMUSCULAR | Status: AC
  Filled 2013-03-10: qty 1

## 2013-03-10 MED ORDER — LIDOCAINE HCL (PF) 1 % IJ SOLN
INTRAMUSCULAR | Status: AC
  Filled 2013-03-10: qty 30

## 2013-03-10 NOTE — Interval H&P Note (Signed)
History and Physical Interval Note:  03/10/2013 8:36 AM  Blake Cherry  has presented today for surgery, with the diagnosis of pvd  The various methods of treatment have been discussed with the patient and family. After consideration of risks, benefits and other options for treatment, the patient has consented to  Procedure(s): ABDOMINAL AORTAGRAM (N/A) as a surgical intervention .  The patient's history has been reviewed, patient examined, no change in status, stable for surgery.  I have reviewed the patient's chart and labs.  Questions were answered to the patient's satisfaction.     FIELDS,CHARLES E

## 2013-03-10 NOTE — Op Note (Addendum)
Procedure: Abdominal aortogram angioplasty left iliac limb of Gore Excluder stent graft  Preoperative diagnosis: Narrowing left iliac limb aortic stent graft  Postoperative diagnosis: Same  Anesthesia: Local  Operative findings: 40-50% narrowing left iliac limb with 20 mm mercury pressure drop after administration of nitroglycerin. Angioplasty with 12 x 4 mm it was balloon with 0 residual narrowing  Operative details: After obtaining informed consent, the patient was taken to the PV lab. The patient was placed in supine position the Angio table. Both groins were prepped and draped in usual sterile fashion. Local anesthesia was infiltrated over the left common femoral artery. Ultrasound was used to identify the left common femoral artery. An introducer needle was used to cannulate the left common femoral artery under ultrasound guidance. An 035 versacore wire was threaded into the abdominal aorta under fluoroscopic guidance. A 5 French sheath was placed over the guidewire into the left common femoral artery. This was thoroughly flushed with heparinized saline. A 5 French pigtail catheter was then placed over the guidewire into the abdominal aorta. An abdominal aortogram was obtained an AP and bilateral oblique projections. The aortic stent graft is widely patent proximally. The left and right renal arteries are patent. The aortic stent graft begins just below the level of the renal arteries. There is approximately 40-50% narrowing of the midportion of the left iliac limb. There is no obvious type I or type II endoleak. The right limb of the graft is widely patent.  At this point a 5 French pigtail catheter was exchanged for a 5 [place] straight catheter. This was advanced up into the main body of the graft. 200 mcg of nitroglycerin was injected through the left femoral sheath. Pullback pressures were then obtained. There was greater than a 20 mm gradient across area of narrowing. An 035 first core was  threaded back up into the abdominal aorta and a 5 French straight catheter removed. The 5 French sheath was then exchanged over the wire for a 7 [place] short sheath. A 12 x 4 it was balloon was brought up in the operative field and advanced to the area of narrowing. The patient was given 5000 units of intravenous heparin. After 2 minutes of circulation time, the balloon was inflated to nominal pressure for 1 minute.  Completion films in AP and both oblique projections so that no residual narrowing and no compromise of the right iliac limb. At this point the peak the catheter was removed over a guidewire. The 5 French sheath was left in place the colon the holding area after the ACT was less than 150. The patient tolerated the procedure well and there were no complications.   The patient will be scheduled for followup CT scan in 6 months.  Fabienne Brunsharles Rabab Currington, MD [facility] [place] Office: [phone] Pager: [phone]

## 2013-03-10 NOTE — H&P (View-Only) (Signed)
[facility] [place] HISTORY AND PHYSICAL    History of Present Illness:  Patient is a 68 y.o.68 y.o. year old male who presents for follow-up evaluation of AAA. He underwent Gore Excluder aneurysm stent graft repair June 2014. The patient denies new abdominal or back pain.  The patient's atherosclerotic risk factors remain peripheral arterial disease and hypertension.  These are all currently stable and followed by his primary care physician. He has returned to normal activities.  He denies any claudication symptoms. He has occasional numbness in his left leg due to prior stroke.    Past Medical History   Diagnosis  Date   .  AAA (abdominal aortic aneurysm)     .  Carotid artery occlusion  2007       Left cea   .  Stroke  05/2005        2 in 2007.  Weakness and numbness in left arm and leg   .  Diverticulitis         last time 4 years ago   .  Hypertension         per Dr Tenny Crawoss   .  Shortness of breath         with exertion       Past Surgical History   Procedure  Laterality  Date   .  Carotid stent    2007       left internal carotid artery   .  Carotid endarterectomy  Left  2007   .  Testicle torsion reduction           age of 68   .  Abdominal aortic endovascular stent graft  N/A  08/22/2012       Procedure: ABDOMINAL AORTIC ENDOVASCULAR STENT GRAFT;  Surgeon: Sherren Kernsharles E Fields, MD;  [facility];  Service: Vascular;  Laterality: N/A;         Review of Systems:  Neurologic: denies symptoms of TIA, amaurosis, or stroke Cardiac:denies shortness of breath or chest pain Pulmonary: denies cough or wheeze Abdomen: denies abdominal pain nausea or vomiting    History      Social History   .  Marital Status:  Married       Spouse Name:  N/A       Number of Children:  N/A   .  Years of Education:  N/A      Occupational History   .  Not on file.      Social History Main Topics   .  Smoking status:  Current Every Day Smoker -- 0.25 packs/day for 50 years        Types:  Cigarettes   .  Smokeless tobacco:  Former NeurosurgeonUser       Types:  Snuff   .  Alcohol Use:  No   .  Drug Use:  No   .  Sexually Active:  Not on file      Other Topics  Concern   .  Not on file      Social History Narrative   .  No narrative on file     No Known Allergies    Current Outpatient Prescriptions on File Prior to Visit   Medication  Sig  Dispense  Refill   .  aspirin 325 MG tablet  Take 325 mg by mouth daily.         Marland Kitchen.  atorvastatin (LIPITOR) 10 MG tablet  Take 1 tablet (10 mg total)  by mouth daily.   30 tablet   2   .  lisinopril (PRINIVIL,ZESTRIL) 5 MG tablet  Take 1 tablet (5 mg total) by mouth daily.   30 tablet   11   .  metoprolol tartrate (LOPRESSOR) 12.5 mg TABS  Take 0.5 tablets (12.5 mg total) by mouth 2 (two) times daily.   60 tablet   2   .  Multiple Vitamin (MULTIVITAMIN) tablet  Take 1 tablet by mouth daily.         .  Omega-3 Fatty Acids (FISH OIL) 1000 MG CAPS  Take 1,000 mg by mouth 2 (two) times daily.         Marland Kitchen  oxyCODONE (ROXICODONE) 5 MG immediate release tablet  Take 1 tablet (5 mg total) by mouth every 6 (six) hours as needed for pain.   30 tablet   0      No current facility-administered medications on file prior to visit.         Physical Examination     Filed Vitals:   03/02/13 1231  BP: 157/69  Pulse: 69  Resp: 16  Height: 5\' 7"  (1.702 m)  Weight: 208 lb (94.348 kg)     General:  Alert and oriented, no acute distress HEENT: Normal Neck: No bruit or JVD Pulmonary: Clear to auscultation bilaterally Cardiac: Regular Rate and Rhythm without murmur Abdomen: Soft, non-tender, non-distended, normal bowel sounds, no pulsatile mass Extremities: 2+ femoral pulse right 1-2+ left femoral pulse, 1+ dorsalis pedis pulses bilaterally   DATA:   CT angiogram the abdomen and pelvis images were reviewed today.  Current aneurysm diameter is  4.7cm.  There no evidence of endoleak. The top portion of the stent graft is adjacent to the renal  arteries and there is no evidence of migration.  There is suggestion of some compression of the left limb of the stent graft. This was noted on his prior CT as well.  ASSESSMENT:   Doing well status post Gore Excluder stent graft repair aneurysm possible compression of the left limb of his stent graft. Which is slightly diminished left femoral pulse I believe it warrants further investigation.   PLAN: Aortogram possible angioplasty 03/10/2012. Risks benefits possible complications and procedure details were explained the patient today. He understands and agrees to proceed.  Fabienne Bruns, MD [facility] [place] Office: [phone] Pager: [phone]

## 2013-03-10 NOTE — Discharge Instructions (Signed)
Angiography, Care After Refer to this sheet in the next few weeks. These instructions provide you with information on caring for yourself after your procedure. Your health care provider may also give you more specific instructions. Your treatment has been planned according to current medical practices, but problems sometimes occur. Call your health care provider if you have any problems or questions after your procedure.  WHAT TO EXPECT AFTER THE PROCEDURE After your procedure, it is typical to have the following sensations:  Minor discomfort or tenderness and a small bump at the catheter insertion site. The bump should usually decrease in size and tenderness within 1 to 2 weeks.  Any bruising will usually fade within 2 to 4 weeks. HOME CARE INSTRUCTIONS   You may need to keep taking blood thinners if they were prescribed for you. Only take over-the-counter or prescription medicines for pain, fever, or discomfort as directed by your health care provider.  Do not apply powder or lotion to the site.  Do not sit in a bathtub, swimming pool, or whirlpool for 5 to 7 days.  You may shower 24 hours after the procedure. Remove the bandage (dressing) and gently wash the site with plain soap and water. Gently pat the site dry.  Inspect the site at least twice daily.  Limit your activity for the first 24 hours. Do not bend, squat, or lift anything over 10 lb (9 kg) or as directed by your health care provider.  Do not drive home if you are discharged the day of the procedure. Have someone else drive you. Follow instructions about when you can drive or return to work. SEEK MEDICAL CARE IF:  You get lightheaded when standing up.  You have drainage (other than a small amount of blood on the dressing).  You have chills.  You have a fever.  You have redness, warmth, swelling, or pain at the insertion site. SEEK IMMEDIATE MEDICAL CARE IF:   You develop chest pain or shortness of breath, feel  faint, or pass out.  You have bleeding, swelling larger than a walnut, or drainage from the catheter insertion site.  You develop pain, discoloration, coldness, or severe bruising in the leg or arm that held the catheter.  You have heavy bleeding from the site. If this happens, hold pressure on the site. MAKE SURE YOU:  Understand these instructions.  Will watch your condition.  Will get help right away if you are not doing well or get worse. Document Released: 08/28/2004 Document Revised: 10/12/2012 Document Reviewed: 07/04/2012 [facility] Patient Information 2014 [place], [place].  A responsible adult should be with you for the first 24 hours after you arrive home. What to expect:  Any bruising will usually fade within 1 to 2 weeks.  Blood that collects in the tissue (hematoma) may be painful to the touch. It should usually decrease in size and tenderness within 1 to 2 weeks. SEEK IMMEDIATE MEDICAL CARE IF:  You have unusual pain at the groin site.  You have redness, warmth, swelling, or pain at the groin site.  You have drainage (other than a small amount of blood on the dressing).  You have chills.  You have a fever or persistent symptoms for more than 72 hours.  You have a fever and your symptoms suddenly get worse.  Your leg becomes pale, cool, tingly, or numb.  You have heavy bleeding from the site. Lie down, hold pressure on the site, and call 911. Document Released: 03/14/2010 Document Revised: 05/04/2011 Document Reviewed:  03/14/2010 [employer] Patient Information 2014 [employer] [place].

## 2013-03-13 ENCOUNTER — Telehealth: Payer: Self-pay | Admitting: Vascular Surgery

## 2013-03-13 LAB — POCT ACTIVATED CLOTTING TIME: Activated Clotting Time: 199 seconds

## 2013-03-13 NOTE — Telephone Encounter (Addendum)
Message copied by Fredrich BirksMILLIKAN, DANA P on Mon Mar 13, 2013  2:14 PM ------      Message from: PenuelasMCCHESNEY, [place] K      Created: Fri Mar 10, 2013  3:20 PM      Regarding: schedule                   ----- Message -----         From: Sherren Kernsharles E Fields, MD         Sent: 03/10/2013   9:24 AM           To: [facility] Charge Pool            Ultrasound groin      Left iliac angioplasty      Pressure gradient measurement after administration of nitroglycerin            He needs a follow up CTA and office visit in 6 months            Charles ------  03/13/13: spoke with pt re appt, mailed letter, dpm

## 2013-09-12 ENCOUNTER — Other Ambulatory Visit: Payer: Self-pay | Admitting: Vascular Surgery

## 2013-09-12 LAB — CREATININE, SERUM: Creat: 1.4 mg/dL — ABNORMAL HIGH (ref 0.50–1.35)

## 2013-09-12 LAB — BUN: BUN: 18 mg/dL (ref 6–23)

## 2013-09-13 ENCOUNTER — Encounter: Payer: Self-pay | Admitting: Vascular Surgery

## 2013-09-14 ENCOUNTER — Ambulatory Visit (INDEPENDENT_AMBULATORY_CARE_PROVIDER_SITE_OTHER): Payer: Medicare Other | Admitting: Vascular Surgery

## 2013-09-14 ENCOUNTER — Ambulatory Visit
Admission: RE | Admit: 2013-09-14 | Discharge: 2013-09-14 | Disposition: A | Discharge status: Home / Self Care | Payer: Medicare Other | Source: Ambulatory Visit | Attending: Vascular Surgery | Admitting: Vascular Surgery

## 2013-09-14 VITALS — BP 150/80 | HR 86 | Temp 97.7°F | Resp 16 | Ht 68.0 in | Wt 209.0 lb

## 2013-09-14 DIAGNOSIS — I714 Abdominal aortic aneurysm, without rupture, unspecified: Secondary | ICD-10-CM

## 2013-09-14 DIAGNOSIS — Z48812 Encounter for surgical aftercare following surgery on the circulatory system: Secondary | ICD-10-CM

## 2013-09-14 DIAGNOSIS — I739 Peripheral vascular disease, unspecified: Secondary | ICD-10-CM

## 2013-09-14 MED ORDER — IOHEXOL 350 MG/ML SOLN
80.0000 mL | Freq: Once | INTRAVENOUS | Status: AC | PRN
  Administered 2013-09-14: 80 mL via INTRAVENOUS

## 2013-09-14 NOTE — Addendum Note (Signed)
Addended by: Sharee PimpleMCCHESNEY, MARILYN K on: 09/14/2013 01:41 PM   Modules accepted: Orders

## 2013-09-14 NOTE — Progress Notes (Signed)
[facility] [place] HISTORY AND PHYSICAL    History of Present Illness:  Patient is a 68 y.o.68 y.o. year old male who presents for follow-up evaluation of AAA. He underwent Gore Excluder aneurysm stent graft repair June 2014. The patient denies new abdominal or back pain.  The patient's atherosclerotic risk factors remain peripheral arterial disease and hypertension.  These are all currently stable and followed by his primary care physician. He has returned to normal activities.  He denies any claudication symptoms. He has occasional numbness in his left leg due to prior stroke.    Past Medical History    Diagnosis   Date    .   AAA (abdominal aortic aneurysm)       .   Carotid artery occlusion   2007          Left cea    .   Stroke   05/2005           2 in 2007.  Weakness and numbness in left arm and leg    .   Diverticulitis             last time 4 years ago    .   Hypertension             per Dr Tenny Craw    .   Shortness of breath             with exertion        Past Surgical History    Procedure   Laterality   Date    .   Carotid stent      2007          left internal carotid artery    .   Carotid endarterectomy   Left   2007    .   Testicle torsion reduction                age of 38    .   Abdominal aortic endovascular stent graft   N/A   08/22/2012          Procedure: ABDOMINAL AORTIC ENDOVASCULAR STENT GRAFT;  Surgeon: Sherren Kerns, MD;  Location: [facility];  Service: Vascular;  Laterality: N/A;          Review of Systems:  Neurologic: denies symptoms of TIA, amaurosis, or stroke Cardiac:denies shortness of breath or chest pain Pulmonary: denies cough or wheeze Abdomen: denies abdominal pain nausea or vomiting    History       Social History    .   Marital Status:   Married          Spouse Name:   N/A          Number of Children:   N/A    .   Years of Education:   N/A       Occupational History    .   Not on file.       Social History Main  Topics    .   Smoking status:   Current Every Day Smoker -- 0.25 packs/day for 50 years          Types:   Cigarettes    .   Smokeless tobacco:   Former Neurosurgeon          Types:   Snuff    .   Alcohol Use:   No    .   Drug Use:   No    .  Sexually Active:   Not on file       Other Topics   Concern    .   Not on file       Social History Narrative    .   No narrative on file      No Known Allergies    Current Outpatient Prescriptions on File Prior to Visit    Medication   Sig   Dispense   Refill    .   aspirin 325 MG tablet   Take 325 mg by mouth daily.            Marland Kitchen.   atorvastatin (LIPITOR) 10 MG tablet   Take 1 tablet (10 mg total) by mouth daily.    30 tablet    2    .   lisinopril (PRINIVIL,ZESTRIL) 5 MG tablet   Take 1 tablet (5 mg total) by mouth daily.    30 tablet    11    .   metoprolol tartrate (LOPRESSOR) 12.5 mg TABS   Take 0.5 tablets (12.5 mg total) by mouth 2 (two) times daily.    60 tablet    2    .   Multiple Vitamin (MULTIVITAMIN) tablet   Take 1 tablet by mouth daily.            .   Omega-3 Fatty Acids (FISH OIL) 1000 MG CAPS   Take 1,000 mg by mouth 2 (two) times daily.            Marland Kitchen.   oxyCODONE (ROXICODONE) 5 MG immediate release tablet   Take 1 tablet (5 mg total) by mouth every 6 (six) hours as needed for pain.    30 tablet    0       No current facility-administered medications on file prior to visit.          Physical Examination  Filed Vitals:   09/14/13 1235  BP: 150/80  Pulse: 86  Temp: 97.7 F (36.5 C)  TempSrc: Oral  Resp: 16  Height: 5\' 8"  (1.727 m)  Weight: 209 lb (94.802 kg)  SpO2: 99%   General:  Alert and oriented, no acute distress Abdomen: Soft, non-tender, non-distended, normal bowel sounds, no pulsatile mass Extremities: 2+ femoral pulse right 2+ left femoral pulse  DATA:   CT angiogram the abdomen and pelvis images were reviewed today.  Current aneurysm diameter is  4.6x4.1cm.  There no evidence of endoleak. The top portion of the  stent graft is adjacent to the renal arteries and there is no evidence of migration.  no obvious narrowing of the left iliac limb at this point  ASSESSMENT:   Doing well status post Gore Excluder stent graft repair aneurysm resolve compression of the left limb of his stent graft after angioplasty.   PLAN: Ultrasound in the graft in 1 year  Fabienne Brunsharles Fields, MD [facility] [place] Office: [phone] Pager: [phone]

## 2013-10-11 ENCOUNTER — Other Ambulatory Visit: Payer: Self-pay | Admitting: Family Medicine

## 2013-10-17 ENCOUNTER — Encounter (INDEPENDENT_AMBULATORY_CARE_PROVIDER_SITE_OTHER): Payer: Self-pay

## 2013-10-23 ENCOUNTER — Ambulatory Visit (INDEPENDENT_AMBULATORY_CARE_PROVIDER_SITE_OTHER): Payer: Medicare Other | Admitting: Family Medicine

## 2013-10-23 ENCOUNTER — Encounter: Payer: Self-pay | Admitting: Family Medicine

## 2013-10-23 VITALS — BP 122/82 | Ht 67.0 in | Wt 210.0 lb

## 2013-10-23 DIAGNOSIS — E785 Hyperlipidemia, unspecified: Secondary | ICD-10-CM

## 2013-10-23 DIAGNOSIS — Z125 Encounter for screening for malignant neoplasm of prostate: Secondary | ICD-10-CM

## 2013-10-23 DIAGNOSIS — Z79899 Other long term (current) drug therapy: Secondary | ICD-10-CM

## 2013-10-23 DIAGNOSIS — R7309 Other abnormal glucose: Secondary | ICD-10-CM

## 2013-10-23 DIAGNOSIS — I10 Essential (primary) hypertension: Secondary | ICD-10-CM

## 2013-10-23 DIAGNOSIS — R739 Hyperglycemia, unspecified: Secondary | ICD-10-CM

## 2013-10-23 MED ORDER — LISINOPRIL 2.5 MG PO TABS: ORAL_TABLET | ORAL | Status: DC

## 2013-10-23 NOTE — Progress Notes (Signed)
   Subjective:    Patient ID: Blake Cherry, male    DOB: 1945-10-04, 68 y.o.   MRN: [id number]  Hypertension This is a chronic problem. The current episode started more than 1 year ago. The problem has been gradually improving since onset. The problem is controlled. Pertinent negatives include no chest pain. There are no associated agents to hypertension. There are no known risk factors for coronary artery disease. Treatments tried: lisinopril. The current treatment provides significant improvement. There are no compliance problems.    patient denies any rectal bleeding or hematuria. He has cut back on smoking down to 4 cigarettes He does try to watch how he does. Does not do any intense exercise but does do some walking.  Recently saw vascular Dr. needed revision on his aortic aneurysm bypass earlier this year they will see him back in one years time. Patient states that he has no other concerns at this time.  He states he does get a little short of breath if he pushes himself too much but otherwise breathing doing fine denies chest pressure or tightness. Review of Systems  Constitutional: Negative for activity change, appetite change and fatigue.  HENT: Negative for congestion.   Respiratory: Negative for cough.   Cardiovascular: Negative for chest pain.  Gastrointestinal: Negative for abdominal pain.  Endocrine: Negative for polydipsia and polyphagia.  Neurological: Negative for weakness.  Psychiatric/Behavioral: Negative for confusion.       Objective:   Physical Exam  Vitals reviewed. Constitutional: He appears well-nourished. No distress.  Cardiovascular: Normal rate, regular rhythm and normal heart sounds.   No murmur heard. Pulmonary/Chest: Effort normal and breath sounds normal. No respiratory distress.  Musculoskeletal: He exhibits no edema.  Lymphadenopathy:    He has no cervical adenopathy.  Neurological: He is alert.  Psychiatric: His behavior is normal.           Assessment & Plan:  #1 HTN-blood pressure readings are good here he states his readings in the morning typically look good but later in the afternoon are mildly. He will follow some readings over the next several weeks and send those back to [place]. We may have to boost his medication  #2 stroke prevention aspirin daily no stroke symptoms currently  History hyperlipidemia check lipid profile  Will also check hemoglobin A1c and PSA.  He will be due Prevnar 13 at the end of this year. Also do colonoscopy 2017

## 2013-10-23 NOTE — Patient Instructions (Signed)
End of November- Prevnar 13

## 2013-10-27 LAB — HEMOGLOBIN A1C
Hgb A1c MFr Bld: 5.8 % — ABNORMAL HIGH
Mean Plasma Glucose: 120 mg/dL — ABNORMAL HIGH

## 2013-10-28 LAB — BASIC METABOLIC PANEL
BUN: 17 mg/dL (ref 6–23)
CO2: 25 mEq/L (ref 19–32)
Calcium: 9.2 mg/dL (ref 8.4–10.5)
Chloride: 103 mEq/L (ref 96–112)
Creat: 1.33 mg/dL (ref 0.50–1.35)
Glucose, Bld: 93 mg/dL (ref 70–99)
Potassium: 4.9 mEq/L (ref 3.5–5.3)
Sodium: 137 mEq/L (ref 135–145)

## 2013-10-28 LAB — LIPID PANEL
Cholesterol: 135 mg/dL (ref 0–200)
HDL: 37 mg/dL — ABNORMAL LOW (ref >39)
LDL Cholesterol: 74 mg/dL (ref 0–99)
Total CHOL/HDL Ratio: 3.6 Ratio
Triglycerides: 120 mg/dL (ref <150)
VLDL: 24 mg/dL (ref 0–40)

## 2013-10-28 LAB — PSA: PSA: 1.22 ng/mL (ref <=4.00)

## 2013-10-30 ENCOUNTER — Encounter: Payer: Self-pay | Admitting: Family Medicine

## 2013-12-08 ENCOUNTER — Other Ambulatory Visit: Payer: Self-pay

## 2014-02-01 ENCOUNTER — Encounter (HOSPITAL_COMMUNITY): Payer: Self-pay | Admitting: Vascular Surgery

## 2014-06-19 ENCOUNTER — Other Ambulatory Visit: Payer: Self-pay | Admitting: Family Medicine

## 2014-07-05 ENCOUNTER — Encounter: Payer: Self-pay | Admitting: Family Medicine

## 2014-07-05 ENCOUNTER — Ambulatory Visit (INDEPENDENT_AMBULATORY_CARE_PROVIDER_SITE_OTHER): Payer: Medicare Other | Admitting: Family Medicine

## 2014-07-05 VITALS — BP 134/76 | Ht 67.0 in | Wt 208.0 lb

## 2014-07-05 DIAGNOSIS — E785 Hyperlipidemia, unspecified: Secondary | ICD-10-CM

## 2014-07-05 DIAGNOSIS — I10 Essential (primary) hypertension: Secondary | ICD-10-CM | POA: Diagnosis not present

## 2014-07-05 MED ORDER — LISINOPRIL 2.5 MG PO TABS: 2.5000 mg | ORAL_TABLET | Freq: Every day | ORAL | Status: DC

## 2014-07-05 MED ORDER — ALBUTEROL SULFATE HFA 108 (90 BASE) MCG/ACT IN AERS: 2.0000 | INHALATION_SPRAY | RESPIRATORY_TRACT | Status: DC | PRN

## 2014-07-05 MED ORDER — TRIAMCINOLONE ACETONIDE 0.1 % EX OINT: 1.0000 "application " | TOPICAL_OINTMENT | Freq: Two times a day (BID) | CUTANEOUS | Status: DC | PRN

## 2014-07-05 NOTE — Progress Notes (Signed)
   Subjective:    Patient ID: Blake Cherry, male    DOB: 06/02/1945, 69 y.o.   MRN: [id number]  Hypertension This is a chronic problem. The current episode started more than 1 year ago. Pertinent negatives include no chest pain. Treatments tried: lisinopril. There are no compliance problems (does some walking, tries to eat healthy).   Needs refills on lisinopril and ventolin inhaler.  Patient still smokes. Pt states no other concerns today.   Review of Systems  Constitutional: Negative for activity change, appetite change and fatigue.  HENT: Negative for congestion.   Respiratory: Negative for cough.   Cardiovascular: Negative for chest pain.  Gastrointestinal: Negative for abdominal pain.  Endocrine: Negative for polydipsia and polyphagia.  Neurological: Negative for weakness.  Psychiatric/Behavioral: Negative for confusion.       Objective:   Physical Exam  Constitutional: He appears well-nourished. No distress.  Cardiovascular: Normal rate, regular rhythm and normal heart sounds.   No murmur heard. Pulmonary/Chest: Effort normal and breath sounds normal. No respiratory distress.  Musculoskeletal: He exhibits no edema.  Lymphadenopathy:    He has no cervical adenopathy.  Neurological: He is alert.  Psychiatric: His behavior is normal.  Vitals reviewed.         Assessment & Plan:  HTN good control continue current measures Patient is due for lab work later this year Albuterol inhaler reissued Patient was informed he could switch aspirin to 81 mg Patient encouraged quit smoking Patient would benefit from lung cancer screening  This patient has greater than a 30-pack-year history of smoking. Estimated-pack-year history approximately 50. Still smokes. Has been counseled to quit smoking. He is not having any symptoms of lung cancer no hemoptysis no unexplained weight loss no prior diagnosis of lung cancer or anything else in the past 5 years he is healthy in willing  enough to undergo biopsies and surgeries if necessary he is not having any active pneumonia or bronchitis in the he has had a shared decision making visit today I do believe it is wise for him to do a low dose CAT scan. The patient agrees to this after shared decision-making.

## 2014-07-07 ENCOUNTER — Encounter (HOSPITAL_COMMUNITY): Payer: Self-pay

## 2014-07-07 ENCOUNTER — Emergency Department (HOSPITAL_COMMUNITY): Payer: Medicare Other

## 2014-07-07 ENCOUNTER — Emergency Department (HOSPITAL_COMMUNITY)
Admission: EM | Admit: 2014-07-07 | Discharge: 2014-07-07 | Disposition: A | Discharge status: Home / Self Care | Payer: Medicare Other | Attending: Emergency Medicine | Admitting: Emergency Medicine

## 2014-07-07 DIAGNOSIS — I10 Essential (primary) hypertension: Secondary | ICD-10-CM | POA: Diagnosis not present

## 2014-07-07 DIAGNOSIS — Z79899 Other long term (current) drug therapy: Secondary | ICD-10-CM | POA: Insufficient documentation

## 2014-07-07 DIAGNOSIS — Z7982 Long term (current) use of aspirin: Secondary | ICD-10-CM | POA: Insufficient documentation

## 2014-07-07 DIAGNOSIS — Z8673 Personal history of transient ischemic attack (TIA), and cerebral infarction without residual deficits: Secondary | ICD-10-CM | POA: Diagnosis not present

## 2014-07-07 DIAGNOSIS — J441 Chronic obstructive pulmonary disease with (acute) exacerbation: Secondary | ICD-10-CM | POA: Diagnosis not present

## 2014-07-07 DIAGNOSIS — Z8719 Personal history of other diseases of the digestive system: Secondary | ICD-10-CM | POA: Diagnosis not present

## 2014-07-07 DIAGNOSIS — R000 Tachycardia, unspecified: Secondary | ICD-10-CM | POA: Diagnosis not present

## 2014-07-07 DIAGNOSIS — Z7952 Long term (current) use of systemic steroids: Secondary | ICD-10-CM | POA: Insufficient documentation

## 2014-07-07 DIAGNOSIS — R0602 Shortness of breath: Secondary | ICD-10-CM | POA: Diagnosis present

## 2014-07-07 DIAGNOSIS — Z720 Tobacco use: Secondary | ICD-10-CM | POA: Insufficient documentation

## 2014-07-07 LAB — CBC WITH DIFFERENTIAL/PLATELET
Basophils Absolute: 0.2 10*3/uL — ABNORMAL HIGH (ref 0.0–0.1)
Basophils Relative: 2 % — ABNORMAL HIGH (ref 0–1)
Eosinophils Absolute: 1.4 10*3/uL — ABNORMAL HIGH (ref 0.0–0.7)
Eosinophils Relative: 13 % — ABNORMAL HIGH (ref 0–5)
HCT: 51.6 % (ref 39.0–52.0)
Hemoglobin: 17.1 g/dL — ABNORMAL HIGH (ref 13.0–17.0)
Lymphocytes Relative: 24 % (ref 12–46)
Lymphs Abs: 2.7 10*3/uL (ref 0.7–4.0)
MCH: 33.2 pg (ref 26.0–34.0)
MCHC: 33.1 g/dL (ref 30.0–36.0)
MCV: 100.2 fL — ABNORMAL HIGH (ref 78.0–100.0)
Monocytes Absolute: 0.9 10*3/uL (ref 0.1–1.0)
Monocytes Relative: 8 % (ref 3–12)
Neutro Abs: 6.2 10*3/uL (ref 1.7–7.7)
Neutrophils Relative %: 53 % (ref 43–77)
Platelets: 230 10*3/uL (ref 150–400)
RBC: 5.15 MIL/uL (ref 4.22–5.81)
RDW: 13.1 % (ref 11.5–15.5)
WBC: 11.4 10*3/uL — ABNORMAL HIGH (ref 4.0–10.5)

## 2014-07-07 LAB — BASIC METABOLIC PANEL
Anion gap: 7 (ref 5–15)
BUN: 15 mg/dL (ref 6–20)
CO2: 27 mmol/L (ref 22–32)
Calcium: 8.9 mg/dL (ref 8.9–10.3)
Chloride: 106 mmol/L (ref 101–111)
Creatinine, Ser: 1.37 mg/dL — ABNORMAL HIGH (ref 0.61–1.24)
GFR calc Af Amer: >60 mL/min (ref >60)
GFR calc non Af Amer: 52 mL/min — ABNORMAL LOW (ref >60)
Glucose, Bld: 134 mg/dL — ABNORMAL HIGH (ref 65–99)
Potassium: 4.4 mmol/L (ref 3.5–5.1)
Sodium: 140 mmol/L (ref 135–145)

## 2014-07-07 MED ORDER — METHYLPREDNISOLONE SODIUM SUCC 125 MG IJ SOLR
125.0000 mg | Freq: Once | INTRAMUSCULAR | Status: AC
  Administered 2014-07-07: 125 mg via INTRAVENOUS
  Filled 2014-07-07: qty 2

## 2014-07-07 MED ORDER — DOXYCYCLINE HYCLATE 100 MG PO CAPS: 100.0000 mg | ORAL_CAPSULE | Freq: Two times a day (BID) | ORAL | Status: DC

## 2014-07-07 MED ORDER — IPRATROPIUM-ALBUTEROL 0.5-2.5 (3) MG/3ML IN SOLN
3.0000 mL | Freq: Once | RESPIRATORY_TRACT | Status: AC
  Administered 2014-07-07: 3 mL via RESPIRATORY_TRACT
  Filled 2014-07-07: qty 3

## 2014-07-07 MED ORDER — PREDNISONE 20 MG PO TABS: 60.0000 mg | ORAL_TABLET | Freq: Every day | ORAL | Status: DC

## 2014-07-07 MED ORDER — ALBUTEROL (5 MG/ML) CONTINUOUS INHALATION SOLN
INHALATION_SOLUTION | RESPIRATORY_TRACT | Status: AC
  Administered 2014-07-07: 15 mg
  Filled 2014-07-07: qty 20

## 2014-07-07 MED ORDER — PREDNISONE 50 MG PO TABS: 60.0000 mg | ORAL_TABLET | Freq: Once | ORAL | Status: DC

## 2014-07-07 MED ORDER — IPRATROPIUM BROMIDE 0.02 % IN SOLN
RESPIRATORY_TRACT | Status: AC
  Administered 2014-07-07: 0.5 mg
  Filled 2014-07-07: qty 2.5

## 2014-07-07 NOTE — Discharge Instructions (Signed)

## 2014-07-07 NOTE — ED Notes (Signed)
Pt tolerated ambulation well around nurses station with sats at 92% and highest HR at 124. Pt complaining of slight shortness of breath upon re entry to room.

## 2014-07-07 NOTE — ED Provider Notes (Signed)
CSN: [id number]     Arrival date & time 07/07/14  0430 History   First MD Initiated Contact with Patient 07/07/14 0435     Chief Complaint  Patient presents with  . Shortness of Breath     (Consider location/radiation/quality/duration/timing/severity/associated sxs/prior Treatment) HPI  This is a 69 year old male with a history of AAA, coronary artery disease, hypertension, COPD who presents with shortness of breath. Patient reports worsening shortness of breath over the last several days.  He has been using inhaler at home with minimal relief. Denies any fevers. Reports nonproductive cough. Denies any chest pain or lower extremity swelling. Denies any history of heart failure.  Patient reports last COPD exacerbation rated in one year ago. Reports that usually has exacerbations at the change of season.  Past Medical History  Diagnosis Date  . AAA (abdominal aortic aneurysm)   . Carotid artery occlusion 2007    Left cea  . Stroke 05/2005     2 in 2007.  Weakness and numbness in left arm and leg  . Diverticulitis     last time 4 years ago  . Hypertension     per Dr Tenny Crawoss  . Shortness of breath     with exertion   Past Surgical History  Procedure Laterality Date  . Carotid stent  2007    left internal carotid artery  . Carotid endarterectomy Left 2007  . Testicle torsion reduction      age of 69  . Abdominal aortic endovascular stent graft N/A 08/22/2012    Procedure: ABDOMINAL AORTIC ENDOVASCULAR STENT GRAFT;  Surgeon: Sherren Kernsharles E Fields, MD;  [facility];  Service: Vascular;  Laterality: N/A;  . Abdominal aortagram N/A 03/10/2013    Procedure: ABDOMINAL Ronny FlurryAORTAGRAM;  Surgeon: Sherren Kernsharles E Fields, MD;  [facility];  Service: Cardiovascular;  Laterality: N/A;   Family History  Problem Relation Age of Onset  . Cancer Mother     Breast cancer  . Hypertension Mother   . Diabetes Mother   . Diabetes Father    History  Substance Use Topics  . Smoking status: Current  Every Day Smoker -- 1.00 packs/day for 50 years    Types: Cigarettes  . Smokeless tobacco: Former NeurosurgeonUser    Types: Snuff  . Alcohol Use: No    Review of Systems  Constitutional: Negative.  Negative for fever.  Respiratory: Positive for cough, shortness of breath and wheezing. Negative for chest tightness.   Cardiovascular: Negative.  Negative for chest pain.  Gastrointestinal: Negative.  Negative for abdominal pain.  Genitourinary: Negative.  Negative for dysuria.  Musculoskeletal: Negative for back pain.  Skin: Negative for rash.  Neurological: Negative for headaches.  All other systems reviewed and are negative.     Allergies  Review of patient's allergies indicates no known allergies.  Home Medications   Prior to Admission medications   Medication Sig Start Date End Date Taking? Authorizing Provider  albuterol (PROVENTIL HFA;VENTOLIN HFA) 108 (90 BASE) MCG/ACT inhaler Inhale 2 puffs into the lungs every 4 (four) hours as needed for wheezing or shortness of breath. 07/05/14  Yes Babs SciaraScott A Luking, MD  aspirin 325 MG tablet Take 325 mg by mouth daily.   Yes Historical Provider, MD  lisinopril (PRINIVIL,ZESTRIL) 2.5 MG tablet Take 1 tablet (2.5 mg total) by mouth daily. 07/05/14  Yes Babs SciaraScott A Luking, MD  triamcinolone ointment (KENALOG) 0.1 % Apply 1 application topically 2 (two) times daily between meals as needed. 07/05/14  Yes Scott A Luking,  MD  doxycycline (VIBRAMYCIN) 100 MG capsule Take 1 capsule (100 mg total) by mouth 2 (two) times daily. 07/07/14   Shon Batonourtney F Tanazia Achee, MD  predniSONE (DELTASONE) 20 MG tablet Take 3 tablets (60 mg total) by mouth daily with breakfast. 07/07/14   Shon Batonourtney F Sakura Denis, MD   BP 142/82 mmHg  Pulse 115  Resp 24  SpO2 95% Physical Exam  Constitutional: He is oriented to person, place, and time. He appears well-developed and well-nourished.  HENT:  Head: Normocephalic and atraumatic.  Eyes: Pupils are equal, round, and reactive to light.   Cardiovascular: Regular rhythm and normal heart sounds.   No murmur heard. Tachycardia  Pulmonary/Chest: Effort normal. No respiratory distress. He has wheezes.  Fair air movement, diffuse expiratory wheezing  Abdominal: Soft. Bowel sounds are normal. There is no tenderness. There is no rebound.  Musculoskeletal: He exhibits no edema.  Lymphadenopathy:    He has no cervical adenopathy.  Neurological: He is alert and oriented to person, place, and time.  Skin: Skin is warm and dry.  Psychiatric: He has a normal mood and affect.  Nursing note and vitals reviewed.   ED Course  Procedures (including critical care time) Labs Review Labs Reviewed  CBC WITH DIFFERENTIAL/PLATELET - Abnormal; Notable for the following:    WBC 11.4 (*)    Hemoglobin 17.1 (*)    MCV 100.2 (*)    Eosinophils Relative 13 (*)    Eosinophils Absolute 1.4 (*)    Basophils Relative 2 (*)    Basophils Absolute 0.2 (*)    All other components within normal limits  BASIC METABOLIC PANEL - Abnormal; Notable for the following:    Glucose, Bld 134 (*)    Creatinine, Ser 1.37 (*)    GFR calc non Af Amer 52 (*)    All other components within normal limits    Imaging Review Dg Chest Portable 1 View  07/07/2014   CLINICAL DATA:  Wheezing, congestion, cough, shortness of breath.  EXAM: PORTABLE CHEST - 1 VIEW  COMPARISON:  08/22/2012  FINDINGS: Normal heart size and pulmonary vascularity. Emphysematous changes in the lungs. No focal airspace disease or consolidation. No blunting of costophrenic angles. No pneumothorax. Calcified granulomas in the right mid lung.  IMPRESSION: Emphysematous changes in the lungs. No evidence of active pulmonary disease.   Electronically Signed   By: Burman NievesWilliam  Stevens M.D.   On: 07/07/2014 06:09     EKG Interpretation   Date/Time:  Saturday Jul 07 2014 04:42:57 EDT Ventricular Rate:  114 PR Interval:  152 QRS Duration: 82 QT Interval:  313 QTC Calculation: 431 R Axis:   -23 Text  Interpretation:  Sinus tachycardia Atrial premature complex  Borderline left axis deviation Low voltage, extremity leads Confirmed by  Rhen Dossantos  MD, Samayra Hebel ([id number]) on 07/07/2014 5:01:55 AM      MDM   Final diagnoses:  COPD exacerbation   patient presents with shortness of breath and wheezing. Afebrile on exam. Nontoxic. Patient given Solu-Medrol and placed on continuous DuoNeb. Chest x-ray without evidence of pneumonia. Mild leukocytosis to 11.4. EKG reassuring.  On recheck, patient reports improvement of symptoms, continues to have expiratory wheezing but with better air movement. Repeat DuoNeb ordered. Following DuoNeb, patient was able to ambulate and maintain pulse ox greater than 89%. Will discharge home with prednisone and doxycycline for an acute COPD exacerbation.  After history, exam, and medical workup I feel the patient has been appropriately medically screened and is safe for discharge home. Pertinent diagnoses  were discussed with the patient. Patient was given return precautions.   Shon Baton, MD 07/07/14 [phone]

## 2014-07-07 NOTE — ED Notes (Signed)
   07/07/14 0447  Respiratory  Respiratory (WDL) X  Bilateral Breath Sounds Expiratory wheezes;Inspiratory wheezes  Respiratory Pattern Labored;Tachypnea;Pursed lips;Symmetrical  Chest Assessment Chest expansion symmetrical  O2 Device Room Air  pt states becoming more SOB all day yesterday. Pt has been using inhaler at home w/ no relief.

## 2014-07-07 NOTE — ED Notes (Signed)
Patient with no complaints at this time. Respirations even and unlabored. Skin warm/dry. Discharge instructions reviewed with patient at this time. Patient given opportunity to voice concerns/ask questions. IV removed per policy and band-aid applied to site. Patient discharged at this time and left Emergency Department via wheelchair.  

## 2014-07-07 NOTE — ED Notes (Signed)
Pt reports sob worsening over the past several hours, denies cp.

## 2014-07-17 ENCOUNTER — Encounter (HOSPITAL_COMMUNITY): Payer: Self-pay | Admitting: *Deleted

## 2014-07-17 ENCOUNTER — Emergency Department (HOSPITAL_COMMUNITY): Payer: Medicare Other

## 2014-07-17 ENCOUNTER — Emergency Department (HOSPITAL_COMMUNITY)
Admission: EM | Admit: 2014-07-17 | Discharge: 2014-07-18 | Disposition: A | Discharge status: Home / Self Care | Payer: Medicare Other | Attending: Emergency Medicine | Admitting: Emergency Medicine

## 2014-07-17 DIAGNOSIS — Z720 Tobacco use: Secondary | ICD-10-CM | POA: Diagnosis not present

## 2014-07-17 DIAGNOSIS — Z79899 Other long term (current) drug therapy: Secondary | ICD-10-CM | POA: Diagnosis not present

## 2014-07-17 DIAGNOSIS — J441 Chronic obstructive pulmonary disease with (acute) exacerbation: Secondary | ICD-10-CM | POA: Insufficient documentation

## 2014-07-17 DIAGNOSIS — Z8719 Personal history of other diseases of the digestive system: Secondary | ICD-10-CM | POA: Diagnosis not present

## 2014-07-17 DIAGNOSIS — Z7982 Long term (current) use of aspirin: Secondary | ICD-10-CM | POA: Insufficient documentation

## 2014-07-17 DIAGNOSIS — Z792 Long term (current) use of antibiotics: Secondary | ICD-10-CM | POA: Diagnosis not present

## 2014-07-17 DIAGNOSIS — Z7952 Long term (current) use of systemic steroids: Secondary | ICD-10-CM | POA: Insufficient documentation

## 2014-07-17 DIAGNOSIS — I10 Essential (primary) hypertension: Secondary | ICD-10-CM | POA: Diagnosis not present

## 2014-07-17 DIAGNOSIS — Z8673 Personal history of transient ischemic attack (TIA), and cerebral infarction without residual deficits: Secondary | ICD-10-CM | POA: Insufficient documentation

## 2014-07-17 DIAGNOSIS — R0602 Shortness of breath: Secondary | ICD-10-CM | POA: Diagnosis present

## 2014-07-17 HISTORY — DX: Chronic obstructive pulmonary disease, unspecified: J44.9

## 2014-07-17 MED ORDER — ALBUTEROL SULFATE (2.5 MG/3ML) 0.083% IN NEBU
5.0000 mg | INHALATION_SOLUTION | Freq: Once | RESPIRATORY_TRACT | Status: AC
  Administered 2014-07-17: 5 mg via RESPIRATORY_TRACT
  Filled 2014-07-17: qty 6

## 2014-07-17 NOTE — ED Notes (Signed)
Pt c/o sob that has become worse since being in er on 07-07-2014, pt states that he was treated for bronchitis, thinks that he may have gotten "a little better" but has started getting sob again, reports that he uses his inhaler and then 5 minutes later he is still sob, audible wheezing noted on arrival to tx room, sob is increased with exertion,

## 2014-07-17 NOTE — ED Notes (Signed)
Pt with continued SOB esp. With exertion, last used inhaler 5 min ago

## 2014-07-18 ENCOUNTER — Ambulatory Visit (INDEPENDENT_AMBULATORY_CARE_PROVIDER_SITE_OTHER): Payer: Medicare Other | Admitting: Nurse Practitioner

## 2014-07-18 ENCOUNTER — Other Ambulatory Visit: Payer: Self-pay | Admitting: *Deleted

## 2014-07-18 ENCOUNTER — Telehealth: Payer: Self-pay | Admitting: *Deleted

## 2014-07-18 ENCOUNTER — Other Ambulatory Visit: Payer: Self-pay

## 2014-07-18 ENCOUNTER — Telehealth: Payer: Self-pay | Admitting: Acute Care

## 2014-07-18 ENCOUNTER — Encounter: Payer: Self-pay | Admitting: Nurse Practitioner

## 2014-07-18 VITALS — BP 150/90 | Temp 98.7°F | Ht 67.0 in | Wt 209.2 lb

## 2014-07-18 DIAGNOSIS — Z129 Encounter for screening for malignant neoplasm, site unspecified: Secondary | ICD-10-CM

## 2014-07-18 DIAGNOSIS — R05 Cough: Secondary | ICD-10-CM | POA: Diagnosis not present

## 2014-07-18 DIAGNOSIS — Z87891 Personal history of nicotine dependence: Secondary | ICD-10-CM

## 2014-07-18 DIAGNOSIS — J449 Chronic obstructive pulmonary disease, unspecified: Secondary | ICD-10-CM | POA: Insufficient documentation

## 2014-07-18 DIAGNOSIS — F17200 Nicotine dependence, unspecified, uncomplicated: Secondary | ICD-10-CM

## 2014-07-18 DIAGNOSIS — J441 Chronic obstructive pulmonary disease with (acute) exacerbation: Secondary | ICD-10-CM

## 2014-07-18 DIAGNOSIS — R0602 Shortness of breath: Secondary | ICD-10-CM | POA: Diagnosis not present

## 2014-07-18 LAB — CBC WITH DIFFERENTIAL/PLATELET
Basophils Absolute: 0.1 10*3/uL (ref 0.0–0.1)
Basophils Relative: 1 % (ref 0–1)
Eosinophils Absolute: 2 10*3/uL — ABNORMAL HIGH (ref 0.0–0.7)
Eosinophils Relative: 15 % — ABNORMAL HIGH (ref 0–5)
HCT: 48.5 % (ref 39.0–52.0)
Hemoglobin: 15.1 g/dL (ref 13.0–17.0)
Lymphocytes Relative: 20 % (ref 12–46)
Lymphs Abs: 2.6 10*3/uL (ref 0.7–4.0)
MCH: 31.8 pg (ref 26.0–34.0)
MCHC: 31.1 g/dL (ref 30.0–36.0)
MCV: 102.1 fL — ABNORMAL HIGH (ref 78.0–100.0)
Monocytes Absolute: 1 10*3/uL (ref 0.1–1.0)
Monocytes Relative: 7 % (ref 3–12)
Neutro Abs: 7.5 10*3/uL (ref 1.7–7.7)
Neutrophils Relative %: 57 % (ref 43–77)
Platelets: 237 10*3/uL (ref 150–400)
RBC: 4.75 MIL/uL (ref 4.22–5.81)
RDW: 13.9 % (ref 11.5–15.5)
WBC: 13.2 10*3/uL — ABNORMAL HIGH (ref 4.0–10.5)

## 2014-07-18 LAB — BASIC METABOLIC PANEL
Anion gap: 7 (ref 5–15)
BUN: 16 mg/dL (ref 6–20)
CO2: 28 mmol/L (ref 22–32)
Calcium: 8.9 mg/dL (ref 8.9–10.3)
Chloride: 106 mmol/L (ref 101–111)
Creatinine, Ser: 1.26 mg/dL — ABNORMAL HIGH (ref 0.61–1.24)
GFR calc Af Amer: >60 mL/min (ref >60)
GFR calc non Af Amer: 57 mL/min — ABNORMAL LOW (ref >60)
Glucose, Bld: 106 mg/dL — ABNORMAL HIGH (ref 65–99)
Potassium: 5 mmol/L (ref 3.5–5.1)
Sodium: 141 mmol/L (ref 135–145)

## 2014-07-18 LAB — TROPONIN I: Troponin I: 0.04 ng/mL — ABNORMAL HIGH (ref <0.031)

## 2014-07-18 MED ORDER — METHYLPREDNISOLONE SODIUM SUCC 125 MG IJ SOLR
125.0000 mg | Freq: Once | INTRAMUSCULAR | Status: AC
  Administered 2014-07-18: 125 mg via INTRAVENOUS
  Filled 2014-07-18: qty 2

## 2014-07-18 MED ORDER — LEVALBUTEROL HCL 0.63 MG/3ML IN NEBU
INHALATION_SOLUTION | RESPIRATORY_TRACT | Status: AC
  Administered 2014-07-18: 0.63 mg
  Filled 2014-07-18: qty 9

## 2014-07-18 MED ORDER — IPRATROPIUM BROMIDE 0.02 % IN SOLN
RESPIRATORY_TRACT | Status: AC
  Administered 2014-07-18: 0.5 mg
  Filled 2014-07-18: qty 5

## 2014-07-18 MED ORDER — AZITHROMYCIN 250 MG PO TABS: ORAL_TABLET | ORAL | Status: DC

## 2014-07-18 MED ORDER — PREDNISONE 20 MG PO TABS: ORAL_TABLET | ORAL | Status: DC

## 2014-07-18 MED ORDER — ALBUTEROL (5 MG/ML) CONTINUOUS INHALATION SOLN
INHALATION_SOLUTION | RESPIRATORY_TRACT | Status: AC
  Filled 2014-07-18: qty 20

## 2014-07-18 MED ORDER — ALBUTEROL SULFATE (2.5 MG/3ML) 0.083% IN NEBU: INHALATION_SOLUTION | RESPIRATORY_TRACT | Status: DC

## 2014-07-18 MED ORDER — BENZONATATE 100 MG PO CAPS: 100.0000 mg | ORAL_CAPSULE | Freq: Three times a day (TID) | ORAL | Status: DC | PRN

## 2014-07-18 MED ORDER — ALBUTEROL SULFATE (2.5 MG/3ML) 0.083% IN NEBU
2.5000 mg | INHALATION_SOLUTION | Freq: Once | RESPIRATORY_TRACT | Status: AC
  Administered 2014-07-18: 2.5 mg via RESPIRATORY_TRACT

## 2014-07-18 NOTE — Patient Instructions (Addendum)
Need a list of preferred inhaled steroids; this can be faxed or called in to us.

## 2014-07-18 NOTE — Telephone Encounter (Signed)
Per Wendee BeaversBrendale- [name] NP [facility] scan chest cancer screening will contact the patient to have this test set up. Blake Cherry has sent her the information she needs. Patient was notified of this.

## 2014-07-18 NOTE — ED Notes (Signed)
Dr Molpus at bedside,  

## 2014-07-18 NOTE — Telephone Encounter (Signed)
I received a referral from Dr. Cathlyn ParsonsLukings / Sherie Donarolyn Hoskins, NP for this patient to be scheduled for a lung Cancer Screening visit and CT scan.When I called him today,he explained that he has been sick with COPD flares, actually visiting the ED last night ( 07/17/14). I told him I would call him in 1 month to see how he was doing. We prefer to make sure any bronchitis/ infections have been fully treated prior to performing the screening CT's as these factors can complicate the reading of the low dose scans. I explained that to Mr. Yvonne KendallSteger, and that I would call him in 1 month to get him scheduled for a shared decision making visit, and scan. He verbalized understanding and knows that I will call and try to schedule him for the screening the week of June 20th, 2016.

## 2014-07-18 NOTE — Telephone Encounter (Signed)
[facility] -  Order put in for scan. Need to ask pt when he can go.   Please set up this patient for a low dose CT scan of the chest. His office note has full documentation regarding Medicare with shared decision making for cancer screening because of smoking see dictation

## 2014-07-18 NOTE — ED Notes (Signed)
Pt states that he is feeling better after the breathing tx, update given on plan of care,

## 2014-07-18 NOTE — ED Provider Notes (Signed)
CSN: [id number]     Arrival date & time 07/17/14  2258 History  This chart was scribed for Paula LibraJohn Allex Madia, MD by Modena JanskyAlbert Thayil, ED Scribe. This patient was seen in room APA09/APA09 and the patient's care was started at 12:20 AM.   Chief Complaint  Patient presents with  . Shortness of Breath   The history is provided by the patient. No language interpreter was used.   HPI Comments: Blake Cherry is a 69 y.o. male with a hx of bronchitis and COPD who presents to the Emergency Department complaining of moderate SOB that started yesterday. Symptoms are worse supine or with exertion. He has been using his albuterol inhaler more frequently than prescribed without adequate relief. He  was given a breathing treatment in the ED on arrival with partial relief and he is now able to cough up phlegm. He states that he had a recent episode of bronchitis 10 days ago, treated with steroids and an antibiotic. He denies any chest pain, fever, nausea or vomiting.   Past Medical History  Diagnosis Date  . AAA (abdominal aortic aneurysm)   . Carotid artery occlusion 2007    Left cea  . Stroke 05/2005     2 in 2007.  Weakness and numbness in left arm and leg  . Diverticulitis     last time 4 years ago  . Hypertension     per Dr Tenny Crawoss  . Shortness of breath     with exertion  . COPD (chronic obstructive pulmonary disease)    Past Surgical History  Procedure Laterality Date  . Carotid stent  2007    left internal carotid artery  . Carotid endarterectomy Left 2007  . Testicle torsion reduction      age of 89  . Abdominal aortic endovascular stent graft N/A 08/22/2012    Procedure: ABDOMINAL AORTIC ENDOVASCULAR STENT GRAFT;  Surgeon: Sherren Kernsharles E Fields, MD;  [facility];  Service: Vascular;  Laterality: N/A;  . Abdominal aortagram N/A 03/10/2013    Procedure: ABDOMINAL Ronny FlurryAORTAGRAM;  Surgeon: Sherren Kernsharles E Fields, MD;  [facility];  Service: Cardiovascular;  Laterality: N/A;   Family History  Problem  Relation Age of Onset  . Cancer Mother     Breast cancer  . Hypertension Mother   . Diabetes Mother   . Diabetes Father    History  Substance Use Topics  . Smoking status: Current Every Day Smoker -- 1.00 packs/day for 50 years    Types: Cigarettes  . Smokeless tobacco: Former NeurosurgeonUser    Types: Snuff  . Alcohol Use: No    Review of Systems A complete 10 system review of systems was obtained and all systems are negative except as noted in the HPI and PMH.   Allergies  Review of patient's allergies indicates no known allergies.  Home Medications   Prior to Admission medications   Medication Sig Start Date End Date Taking? Authorizing Provider  albuterol (PROVENTIL HFA;VENTOLIN HFA) 108 (90 BASE) MCG/ACT inhaler Inhale 2 puffs into the lungs every 4 (four) hours as needed for wheezing or shortness of breath. 07/05/14   Babs SciaraScott A Luking, MD  aspirin 325 MG tablet Take 325 mg by mouth daily.    Historical Provider, MD  doxycycline (VIBRAMYCIN) 100 MG capsule Take 1 capsule (100 mg total) by mouth 2 (two) times daily. 07/07/14   Shon Batonourtney F Horton, MD  lisinopril (PRINIVIL,ZESTRIL) 2.5 MG tablet Take 1 tablet (2.5 mg total) by mouth daily. 07/05/14  [name], MD  predniSONE (DELTASONE) 20 MG tablet Take 3 tablets (60 mg total) by mouth daily with breakfast. 07/07/14   Shon Baton, MD  triamcinolone ointment (KENALOG) 0.1 % Apply 1 application topically 2 (two) times daily between meals as needed. 07/05/14   [name], MD   BP 157/86 mmHg  Pulse 120  Temp(Src) 98 F (36.7 C) (Oral)  Resp 20  Ht  (1.727 m)  Wt 208 lb (94.348 kg)  BMI 31.63 kg/m2  SpO2 99% Physical Exam  Nursing note and vitals reviewed. General: Well-developed, well-nourished male in no acute distress; appearance consistent with age of record HENT: normocephalic; atraumatic Eyes: pupils equal, round and reactive to light; extraocular muscles intact Neck: supple Heart: regular rhythm; distant  sounds; tachycardia  Lungs: expiratory wheezes Abdomen: soft; nondistended; nontender; no masses or hepatosplenomegaly; bowel sounds present Extremities: No deformity; full range of motion; pulses normal Neurologic: Awake, alert and oriented; motor function intact in all extremities and symmetric; no facial droop Skin: Warm and dry Psychiatric: Normal mood and affect  ED Course  Procedures (including critical care time) DIAGNOSTIC STUDIES: Oxygen Saturation is 99% on RA, normal by my interpretation.    COORDINATION OF CARE: 12:24 AM- Pt advised of plan for treatment which includes medication, radiology, and labs and pt agrees.    EKG Interpretation   Date/Time:  Tuesday Jul 17 2014 23:11:48 EDT Ventricular Rate:  123 PR Interval:  140 QRS Duration: 77 QT Interval:  303 QTC Calculation: 433 R Axis:   35 Text Interpretation:  Sinus tachycardia No significant change was found  Confirmed by Read Drivers  MD, Jonny Ruiz ([id number]) on 07/17/2014 11:21:33 PM      MDM   Nursing notes and vitals signs, including pulse oximetry, reviewed.  Summary of this visit's results, reviewed by myself:  Labs:  Results for orders placed or performed during the hospital encounter of 07/17/14 (from the past 24 hour(s))  CBC with Differential     Status: Abnormal   Collection Time: 07/17/14 11:21 PM  Result Value Ref Range   WBC 13.2 (H) 4.0 - 10.5 K/uL   RBC 4.75 4.22 - 5.81 MIL/uL   Hemoglobin 15.1 13.0 - 17.0 g/dL   HCT 60.4 54.0 - 98.1 %   MCV 102.1 (H) 78.0 - 100.0 fL   MCH 31.8 26.0 - 34.0 pg   MCHC 31.1 30.0 - 36.0 g/dL   RDW 19.1 47.8 - 29.5 %   Platelets 237 150 - 400 K/uL   Neutrophils Relative % 57 43 - 77 %   Neutro Abs 7.5 1.7 - 7.7 K/uL   Lymphocytes Relative 20 12 - 46 %   Lymphs Abs 2.6 0.7 - 4.0 K/uL   Monocytes Relative 7 3 - 12 %   Monocytes Absolute 1.0 0.1 - 1.0 K/uL   Eosinophils Relative 15 (H) 0 - 5 %   Eosinophils Absolute 2.0 (H) 0.0 - 0.7 K/uL   Basophils Relative 1 0 -  1 %   Basophils Absolute 0.1 0.0 - 0.1 K/uL   WBC Morphology ATYPICAL LYMPHOCYTES   Basic metabolic panel     Status: Abnormal   Collection Time: 07/17/14 11:21 PM  Result Value Ref Range   Sodium 141 135 - 145 mmol/L   Potassium 5.0 3.5 - 5.1 mmol/L   Chloride 106 101 - 111 mmol/L   CO2 28 22 - 32 mmol/L   Glucose, Bld 106 (H) 65 - 99 mg/dL   BUN 16 6 -  20 mg/dL   Creatinine, Ser 0.86 (H) 0.61 - 1.24 mg/dL   Calcium 8.9 8.9 - 57.8 mg/dL   GFR calc non Af Amer 57 (L) >60 mL/min   GFR calc Af Amer >60 >60 mL/min   Anion gap 7 5 - 15  Troponin I     Status: Abnormal   Collection Time: 07/17/14 11:21 PM  Result Value Ref Range   Troponin I 0.04 (H) <0.031 ng/mL    Imaging Studies: Dg Chest Portable 1 View  07/17/2014   CLINICAL DATA:  Shortness of breath  EXAM: PORTABLE CHEST - 1 VIEW  COMPARISON:  07/07/2014  FINDINGS: Hyperinflation correlating with history of COPD. Normal heart size and stable mediastinal contours. There is a bone island in the anterior right fifth rib, confirmed on 2007 chest CT. There is no edema, consolidation, effusion, or pneumothorax.  IMPRESSION: COPD without acute superimposed disease.   Electronically Signed   By: Marnee Spring M.D.   On: 07/17/2014 23:55   3:46 AM Patient feels better after continuous neb treatment. Lungs are clear to auscultation. He states he does not feel like he needs to be admitted. He has an appointment with his PCP this morning at 11AM.   I personally performed the services described in this documentation, which was scribed in my presence. The recorded information has been reviewed and is accurate.   Paula Libra, MD 07/18/14 [phone]

## 2014-07-18 NOTE — ED Notes (Signed)
Pt states that he is feeling better, pt and family at bedside updated,

## 2014-07-22 ENCOUNTER — Encounter: Payer: Self-pay | Admitting: Nurse Practitioner

## 2014-07-22 NOTE — Progress Notes (Signed)
Subjective:  Presents with his wife for c/o COPD exacerbation for the past 2 weeks. Has been to ED twice since 5/14. Has portable O2 sat machine. All of his O2 readings at home and ED have been above 90%. Has been using albuterol inhaler frequently. No fever. Frequent cough. Occasionally productive of yellow sputum. Headache with cough. Slight left ear pain. Slight sore throat. Taking fluids well. No acid reflux symptoms.    Objective:   BP 150/90 mmHg  Temp(Src) 98.7 F (37.1 C) (Oral)  Ht 5\' 7"  (1.702 m)  Wt 209 lb 4 oz (94.915 kg)  BMI 32.77 kg/m2 NAD. Alert, oriented. TMs clear effusion. Pharynx injected with PND noted. Neck supple with mild anterior adenopathy. Mild tachypnea. Face very pink in color. Frequent non productive cough noted. Diminished BS in general. Given albuterol 2.5 mg neb treatment. Airflow much improved with faint expiratory wheezes noted. Subjective improvement of breathing. Heart RRR.   Assessment:  Problem List Items Addressed This Visit      Respiratory   COPD (chronic obstructive pulmonary disease) - Primary   Relevant Medications   predniSONE (DELTASONE) 20 MG tablet   albuterol (PROVENTIL) (2.5 MG/3ML) 0.083% nebulizer solution   azithromycin (ZITHROMAX Z-PAK) 250 MG tablet   benzonatate (TESSALON) 100 MG capsule   albuterol (PROVENTIL) (2.5 MG/3ML) 0.083% nebulizer solution 2.5 mg (Completed)     Plan:  Meds ordered this encounter  Medications  . predniSONE (DELTASONE) 20 MG tablet    Sig: 3 po qd x 3 d then 2 po qd x 3 d then 1 po qd x 3 d    Dispense:  18 tablet    Refill:  0    Order Specific Question:  Supervising Provider    Answer:  Merlyn AlbertLUKING, Bobbye S [[id number]]  . albuterol (PROVENTIL) (2.5 MG/3ML) 0.083% nebulizer solution    Sig: Use via neb q 4 hrs prn wheezing    Dispense:  50 vial    Refill:  2    Order Specific Question:  Supervising Provider    Answer:  Merlyn AlbertLUKING, Dorothy S [[id number]]  . azithromycin (ZITHROMAX Z-PAK) 250 MG tablet    Sig:  Take 2 tablets (500 mg) on  Day 1,  followed by 1 tablet (250 mg) once daily on Days 2 through 5.    Dispense:  6 each    Refill:  0    Order Specific Question:  Supervising Provider    Answer:  Merlyn AlbertLUKING, Seymour S [[id number]]  . benzonatate (TESSALON) 100 MG capsule    Sig: Take 1 capsule (100 mg total) by mouth 3 (three) times daily as needed for cough.    Dispense:  30 capsule    Refill:  2    Order Specific Question:  Supervising Provider    Answer:  Merlyn AlbertLUKING, Bronc S [[id number]]  . albuterol (PROVENTIL) (2.5 MG/3ML) 0.083% nebulizer solution 2.5 mg    Sig:    Given Rx for neb machine; may alternate with inhaler. To check into preferred steroid inhaler and call back. Call back in 48 hours if no improvement. Return if symptoms worsen or fail to improve.

## 2014-08-06 ENCOUNTER — Telehealth: Payer: Self-pay | Admitting: Family Medicine

## 2014-08-06 MED ORDER — PREDNISONE 20 MG PO TABS: ORAL_TABLET | ORAL | Status: DC

## 2014-08-06 MED ORDER — AZITHROMYCIN 250 MG PO TABS: ORAL_TABLET | ORAL | Status: DC

## 2014-08-06 NOTE — Telephone Encounter (Signed)
Seen 07/18/14 by Eber Jones, Dx-Chronic obstructive pulmonary disease with acute exacerbation

## 2014-08-06 NOTE — Telephone Encounter (Signed)
Notified patient (per Eber Jones) sent in refill of Zpak and Prednisone to [facility]. Eber Jones recommends that patient start on a preventative breathing medication daily for his COPD. Patient agrees and wants this medication in liquid form so he can do it through his nebulizer machine.

## 2014-08-06 NOTE — Telephone Encounter (Signed)
Patient states he is having shortness of breath, tightness, slight wheezing and no fever. (No other symptoms)

## 2014-08-06 NOTE — Telephone Encounter (Signed)
Pt finished the prednisone & Zpak from late May visit.  Was getting better, down to one breathing treatment a day.  But Friday started getting worse, feels like he needs another round of prednisone and another Zpak.  Please advise, [phone]  [facility]

## 2014-08-07 ENCOUNTER — Other Ambulatory Visit: Payer: Self-pay | Admitting: Nurse Practitioner

## 2014-08-07 MED ORDER — BUDESONIDE 0.5 MG/2ML IN SUSP: 0.5000 mg | Freq: Two times a day (BID) | RESPIRATORY_TRACT | Status: DC

## 2014-08-07 NOTE — Telephone Encounter (Signed)
Patient was notified that script is ready for pickup and to have pharmacy price check it and let us know if it is too expensive.

## 2014-08-07 NOTE — Telephone Encounter (Signed)
A prescription for Pulmicort was printed and given to nurse. She will get patient to pick it up so he can check on cost. Call back if no improvement in breathing. Our goal is for him to be stable off oral Prednisone.

## 2014-08-23 ENCOUNTER — Ambulatory Visit (INDEPENDENT_AMBULATORY_CARE_PROVIDER_SITE_OTHER): Payer: Medicare Other | Admitting: Nurse Practitioner

## 2014-08-23 VITALS — BP 150/100 | HR 130 | Temp 97.4°F | Wt 204.0 lb

## 2014-08-23 DIAGNOSIS — R0602 Shortness of breath: Secondary | ICD-10-CM | POA: Diagnosis not present

## 2014-08-23 DIAGNOSIS — R062 Wheezing: Secondary | ICD-10-CM

## 2014-08-23 DIAGNOSIS — J441 Chronic obstructive pulmonary disease with (acute) exacerbation: Secondary | ICD-10-CM

## 2014-08-23 MED ORDER — PREDNISONE 20 MG PO TABS: ORAL_TABLET | ORAL | Status: DC

## 2014-08-23 MED ORDER — ALBUTEROL SULFATE (2.5 MG/3ML) 0.083% IN NEBU
2.5000 mg | INHALATION_SOLUTION | Freq: Once | RESPIRATORY_TRACT | Status: AC
  Administered 2014-08-23: 2.5 mg via RESPIRATORY_TRACT

## 2014-08-25 ENCOUNTER — Encounter: Payer: Self-pay | Admitting: Nurse Practitioner

## 2014-08-26 NOTE — Progress Notes (Signed)
Subjective:  Presents for complaints of an exacerbation of his wheezing, has been out of electrical power for about 24 hours. Has been unable to take his albuterol treatments. Currently on Pulmicort twice a day. Symptoms were doing much better until his power when out. Taking about 3 albuterol treatments per day. No fever. Producing clear mucus.  Objective:   BP 150/100 mmHg  Pulse 130  Temp(Src) 97.4 F (36.3 C) (Oral)  Wt 204 lb (92.534 kg)  SpO2 88% Initially mild tachypnea, pursed lip breathing and pink face. Lungs diminished breath sounds in general. Given albuterol 2.5 mg treatment. Airflow much improved with faint expiratory wheezes noted. Also subjective improvement of symptoms. TMs minimal clear effusion. Pharynx clear. Neck supple with minimal adenopathy. Heart regular rate rhythm. O2 sat 88% before treatment and 89% after.  Assessment:  Problem List Items Addressed This Visit      Respiratory   COPD (chronic obstructive pulmonary disease) - Primary   Relevant Medications   albuterol (PROVENTIL) (2.5 MG/3ML) 0.083% nebulizer solution 2.5 mg (Completed)   predniSONE (DELTASONE) 20 MG tablet    Other Visit Diagnoses    Wheezing        Relevant Medications    albuterol (PROVENTIL) (2.5 MG/3ML) 0.083% nebulizer solution 2.5 mg (Completed)        Plan:  Meds ordered this encounter  Medications  . albuterol (PROVENTIL) (2.5 MG/3ML) 0.083% nebulizer solution 2.5 mg    Sig:   . predniSONE (DELTASONE) 20 MG tablet    Sig: 3 po qd x 3 d then 2 po qd x 3 d then 1 po qd x 3 d    Dispense:  18 tablet    Refill:  0    Order Specific Question:  Supervising Provider    Answer:  Merlyn AlbertLUKING, Jaymon S [[id number]]   Continue Pulmicort treatments and albuterol treatments as directed. Patient to come back this afternoon for another treatment if no electricity. Defers pulmonology consult at this time. Also discussed use of Spiriva for COPD the patient has concerns about cost. Return if symptoms  worsen or fail to improve.

## 2014-08-28 ENCOUNTER — Telehealth: Payer: Self-pay | Admitting: Family Medicine

## 2014-08-28 DIAGNOSIS — J441 Chronic obstructive pulmonary disease with (acute) exacerbation: Secondary | ICD-10-CM

## 2014-08-28 DIAGNOSIS — R0609 Other forms of dyspnea: Principal | ICD-10-CM

## 2014-08-28 DIAGNOSIS — R0600 Dyspnea, unspecified: Secondary | ICD-10-CM

## 2014-08-28 NOTE — Telephone Encounter (Signed)
Referral placed in Epic.

## 2014-08-28 NOTE — Addendum Note (Signed)
Addended by: Margaretha SheffieldBROWN, AUTUMN S on: 08/28/2014 10:06 AM   Modules accepted: Orders

## 2014-08-28 NOTE — Telephone Encounter (Signed)
Pt with continued DOE and cough, probable COPD, pt wife requests consult with Dr.Hawkins,please place order for referral thank you

## 2014-08-30 ENCOUNTER — Encounter: Payer: Self-pay | Admitting: Family Medicine

## 2014-09-14 ENCOUNTER — Telehealth: Payer: Self-pay | Admitting: Acute Care

## 2014-09-14 NOTE — Telephone Encounter (Signed)
I called Blake Cherry again today to see if he was ready to schedule his screening. He states that he is still having trouble with bronchitis. He has an appointment with Dr. Juanetta Gosling on August 18th, 2016 and wants me to call him back after that appointment to see if he has improved to the point he can have the scan. He understands that acute pulmonary illness can sometimes mask/ complicate the results of the screening CT scan. I will call him again the week after his appointment with Dr. Juanetta Gosling to see if we can get him scheduled after that.

## 2014-09-15 ENCOUNTER — Other Ambulatory Visit: Payer: Self-pay | Admitting: Nurse Practitioner

## 2014-09-16 ENCOUNTER — Inpatient Hospital Stay (HOSPITAL_COMMUNITY)
Admission: EM | Admit: 2014-09-16 | Discharge: 2014-09-19 | DRG: 190 | Disposition: A | Discharge status: Home / Self Care | Payer: Medicare Other | Attending: Internal Medicine | Admitting: Internal Medicine

## 2014-09-16 ENCOUNTER — Emergency Department (HOSPITAL_COMMUNITY): Payer: Medicare Other

## 2014-09-16 ENCOUNTER — Encounter (HOSPITAL_COMMUNITY): Payer: Self-pay

## 2014-09-16 DIAGNOSIS — Z8249 Family history of ischemic heart disease and other diseases of the circulatory system: Secondary | ICD-10-CM

## 2014-09-16 DIAGNOSIS — J9601 Acute respiratory failure with hypoxia: Secondary | ICD-10-CM | POA: Diagnosis present

## 2014-09-16 DIAGNOSIS — F17200 Nicotine dependence, unspecified, uncomplicated: Secondary | ICD-10-CM | POA: Diagnosis present

## 2014-09-16 DIAGNOSIS — N179 Acute kidney failure, unspecified: Secondary | ICD-10-CM | POA: Diagnosis present

## 2014-09-16 DIAGNOSIS — J441 Chronic obstructive pulmonary disease with (acute) exacerbation: Secondary | ICD-10-CM | POA: Diagnosis present

## 2014-09-16 DIAGNOSIS — N183 Chronic kidney disease, stage 3 unspecified: Secondary | ICD-10-CM | POA: Diagnosis present

## 2014-09-16 DIAGNOSIS — I10 Essential (primary) hypertension: Secondary | ICD-10-CM | POA: Diagnosis present

## 2014-09-16 DIAGNOSIS — F17210 Nicotine dependence, cigarettes, uncomplicated: Secondary | ICD-10-CM | POA: Diagnosis present

## 2014-09-16 DIAGNOSIS — N182 Chronic kidney disease, stage 2 (mild): Secondary | ICD-10-CM | POA: Diagnosis present

## 2014-09-16 DIAGNOSIS — E669 Obesity, unspecified: Secondary | ICD-10-CM | POA: Diagnosis present

## 2014-09-16 DIAGNOSIS — Z720 Tobacco use: Secondary | ICD-10-CM | POA: Diagnosis not present

## 2014-09-16 DIAGNOSIS — I129 Hypertensive chronic kidney disease with stage 1 through stage 4 chronic kidney disease, or unspecified chronic kidney disease: Secondary | ICD-10-CM | POA: Diagnosis present

## 2014-09-16 DIAGNOSIS — Z833 Family history of diabetes mellitus: Secondary | ICD-10-CM | POA: Diagnosis not present

## 2014-09-16 DIAGNOSIS — T380X5A Adverse effect of glucocorticoids and synthetic analogues, initial encounter: Secondary | ICD-10-CM | POA: Diagnosis present

## 2014-09-16 DIAGNOSIS — E860 Dehydration: Secondary | ICD-10-CM | POA: Diagnosis present

## 2014-09-16 DIAGNOSIS — R739 Hyperglycemia, unspecified: Secondary | ICD-10-CM | POA: Diagnosis present

## 2014-09-16 DIAGNOSIS — Z7982 Long term (current) use of aspirin: Secondary | ICD-10-CM | POA: Diagnosis not present

## 2014-09-16 DIAGNOSIS — Z8673 Personal history of transient ischemic attack (TIA), and cerebral infarction without residual deficits: Secondary | ICD-10-CM | POA: Diagnosis not present

## 2014-09-16 DIAGNOSIS — N2889 Other specified disorders of kidney and ureter: Secondary | ICD-10-CM | POA: Diagnosis present

## 2014-09-16 DIAGNOSIS — T50905A Adverse effect of unspecified drugs, medicaments and biological substances, initial encounter: Secondary | ICD-10-CM | POA: Diagnosis present

## 2014-09-16 DIAGNOSIS — R0600 Dyspnea, unspecified: Secondary | ICD-10-CM | POA: Diagnosis not present

## 2014-09-16 DIAGNOSIS — R0602 Shortness of breath: Secondary | ICD-10-CM

## 2014-09-16 DIAGNOSIS — Z803 Family history of malignant neoplasm of breast: Secondary | ICD-10-CM

## 2014-09-16 DIAGNOSIS — E875 Hyperkalemia: Secondary | ICD-10-CM | POA: Diagnosis present

## 2014-09-16 LAB — BASIC METABOLIC PANEL
Anion gap: 5 (ref 5–15)
BUN: 17 mg/dL (ref 6–20)
CO2: 29 mmol/L (ref 22–32)
Calcium: 8.8 mg/dL — ABNORMAL LOW (ref 8.9–10.3)
Chloride: 105 mmol/L (ref 101–111)
Creatinine, Ser: 1.43 mg/dL — ABNORMAL HIGH (ref 0.61–1.24)
GFR calc Af Amer: 57 mL/min — ABNORMAL LOW (ref >60)
GFR calc non Af Amer: 49 mL/min — ABNORMAL LOW (ref >60)
Glucose, Bld: 121 mg/dL — ABNORMAL HIGH (ref 65–99)
Potassium: 5.1 mmol/L (ref 3.5–5.1)
Sodium: 139 mmol/L (ref 135–145)

## 2014-09-16 LAB — CBC WITH DIFFERENTIAL/PLATELET
Basophils Absolute: 0.1 10*3/uL (ref 0.0–0.1)
Basophils Relative: 1 % (ref 0–1)
Eosinophils Absolute: 1.8 10*3/uL — ABNORMAL HIGH (ref 0.0–0.7)
Eosinophils Relative: 15 % — ABNORMAL HIGH (ref 0–5)
HCT: 48 % (ref 39.0–52.0)
Hemoglobin: 15.5 g/dL (ref 13.0–17.0)
Lymphocytes Relative: 14 % (ref 12–46)
Lymphs Abs: 1.7 10*3/uL (ref 0.7–4.0)
MCH: 32.2 pg (ref 26.0–34.0)
MCHC: 32.3 g/dL (ref 30.0–36.0)
MCV: 99.8 fL (ref 78.0–100.0)
Monocytes Absolute: 0.9 10*3/uL (ref 0.1–1.0)
Monocytes Relative: 7 % (ref 3–12)
Neutro Abs: 7.6 10*3/uL (ref 1.7–7.7)
Neutrophils Relative %: 63 % (ref 43–77)
Platelets: 229 10*3/uL (ref 150–400)
RBC: 4.81 MIL/uL (ref 4.22–5.81)
RDW: 13.9 % (ref 11.5–15.5)
WBC: 12.2 10*3/uL — ABNORMAL HIGH (ref 4.0–10.5)

## 2014-09-16 LAB — TROPONIN I: Troponin I: <0.03 ng/mL (ref <0.031)

## 2014-09-16 LAB — GLUCOSE, CAPILLARY
Glucose-Capillary: 228 mg/dL — ABNORMAL HIGH (ref 65–99)
Glucose-Capillary: 123 mg/dL — ABNORMAL HIGH (ref 65–99)

## 2014-09-16 MED ORDER — IPRATROPIUM BROMIDE 0.02 % IN SOLN
1.0000 mg | Freq: Once | RESPIRATORY_TRACT | Status: AC
  Administered 2014-09-16: 1 mg via RESPIRATORY_TRACT
  Filled 2014-09-16: qty 5

## 2014-09-16 MED ORDER — POTASSIUM CHLORIDE IN NACL 20-0.9 MEQ/L-% IV SOLN
INTRAVENOUS | Status: DC
Start: 2014-09-16 — End: 2014-09-17
  Administered 2014-09-16 – 2014-09-17 (×2): via INTRAVENOUS

## 2014-09-16 MED ORDER — INSULIN GLARGINE 100 UNIT/ML ~~LOC~~ SOLN
10.0000 [IU] | Freq: Every day | SUBCUTANEOUS | Status: DC
  Administered 2014-09-16 – 2014-09-18 (×3): 10 [IU] via SUBCUTANEOUS
  Filled 2014-09-16 (×4): qty 0.1

## 2014-09-16 MED ORDER — BENZONATATE 100 MG PO CAPS
100.0000 mg | ORAL_CAPSULE | Freq: Three times a day (TID) | ORAL | Status: DC | PRN
  Administered 2014-09-18 – 2014-09-19 (×2): 100 mg via ORAL
  Filled 2014-09-16 (×2): qty 1

## 2014-09-16 MED ORDER — DEXTROSE 5 % IV SOLN
500.0000 mg | Freq: Once | INTRAVENOUS | Status: AC
  Administered 2014-09-16: 500 mg via INTRAVENOUS
  Filled 2014-09-16: qty 500

## 2014-09-16 MED ORDER — METHYLPREDNISOLONE SODIUM SUCC 125 MG IJ SOLR
125.0000 mg | Freq: Once | INTRAMUSCULAR | Status: AC
  Administered 2014-09-16: 125 mg via INTRAVENOUS
  Filled 2014-09-16: qty 2

## 2014-09-16 MED ORDER — LISINOPRIL 5 MG PO TABS
2.5000 mg | ORAL_TABLET | Freq: Every day | ORAL | Status: DC
  Administered 2014-09-16: 2.5 mg via ORAL
  Filled 2014-09-16: qty 1

## 2014-09-16 MED ORDER — ACETAMINOPHEN 325 MG PO TABS: 650.0000 mg | ORAL_TABLET | Freq: Four times a day (QID) | ORAL | Status: DC | PRN

## 2014-09-16 MED ORDER — FAMOTIDINE 20 MG PO TABS
20.0000 mg | ORAL_TABLET | Freq: Every day | ORAL | Status: DC
  Administered 2014-09-16 – 2014-09-19 (×4): 20 mg via ORAL
  Filled 2014-09-16 (×4): qty 1

## 2014-09-16 MED ORDER — METHYLPREDNISOLONE SODIUM SUCC 125 MG IJ SOLR
80.0000 mg | Freq: Three times a day (TID) | INTRAMUSCULAR | Status: DC
  Administered 2014-09-16 – 2014-09-19 (×9): 80 mg via INTRAVENOUS
  Filled 2014-09-16 (×9): qty 2

## 2014-09-16 MED ORDER — ONDANSETRON HCL 4 MG/2ML IJ SOLN: 4.0000 mg | Freq: Four times a day (QID) | INTRAMUSCULAR | Status: DC | PRN

## 2014-09-16 MED ORDER — BUDESONIDE 0.5 MG/2ML IN SUSP
RESPIRATORY_TRACT | Status: AC
  Filled 2014-09-16: qty 2

## 2014-09-16 MED ORDER — ONDANSETRON HCL 4 MG PO TABS: 4.0000 mg | ORAL_TABLET | Freq: Four times a day (QID) | ORAL | Status: DC | PRN

## 2014-09-16 MED ORDER — CEFTRIAXONE SODIUM 1 G IJ SOLR
1.0000 g | Freq: Once | INTRAMUSCULAR | Status: AC
  Administered 2014-09-16: 1 g via INTRAVENOUS
  Filled 2014-09-16: qty 10

## 2014-09-16 MED ORDER — TIOTROPIUM BROMIDE MONOHYDRATE 18 MCG IN CAPS
18.0000 ug | ORAL_CAPSULE | Freq: Every day | RESPIRATORY_TRACT | Status: DC
  Administered 2014-09-17 – 2014-09-19 (×3): 18 ug via RESPIRATORY_TRACT
  Filled 2014-09-16: qty 5

## 2014-09-16 MED ORDER — ASPIRIN 325 MG PO TABS
325.0000 mg | ORAL_TABLET | Freq: Every day | ORAL | Status: DC
  Administered 2014-09-16 – 2014-09-18 (×3): 325 mg via ORAL
  Filled 2014-09-16 (×3): qty 1

## 2014-09-16 MED ORDER — HYDROCOD POLST-CPM POLST ER 10-8 MG/5ML PO SUER
5.0000 mL | Freq: Two times a day (BID) | ORAL | Status: DC
  Administered 2014-09-16 – 2014-09-19 (×6): 5 mL via ORAL
  Filled 2014-09-16 (×6): qty 5

## 2014-09-16 MED ORDER — GUAIFENESIN ER 600 MG PO TB12
600.0000 mg | ORAL_TABLET | Freq: Two times a day (BID) | ORAL | Status: DC
  Administered 2014-09-16 – 2014-09-19 (×6): 600 mg via ORAL
  Filled 2014-09-16 (×6): qty 1

## 2014-09-16 MED ORDER — LEVALBUTEROL HCL 0.63 MG/3ML IN NEBU
INHALATION_SOLUTION | RESPIRATORY_TRACT | Status: AC
  Filled 2014-09-16: qty 3

## 2014-09-16 MED ORDER — BUDESONIDE 0.5 MG/2ML IN SUSP
0.5000 mg | Freq: Two times a day (BID) | RESPIRATORY_TRACT | Status: DC
  Administered 2014-09-16: 0.5 mg via RESPIRATORY_TRACT
  Filled 2014-09-16 (×3): qty 2

## 2014-09-16 MED ORDER — ALBUTEROL (5 MG/ML) CONTINUOUS INHALATION SOLN
10.0000 mg/h | INHALATION_SOLUTION | Freq: Once | RESPIRATORY_TRACT | Status: AC
  Administered 2014-09-16: 10 mg/h via RESPIRATORY_TRACT
  Filled 2014-09-16: qty 20

## 2014-09-16 MED ORDER — DEXTROSE 5 % IV SOLN
500.0000 mg | INTRAVENOUS | Status: DC
  Administered 2014-09-17 – 2014-09-18 (×2): 500 mg via INTRAVENOUS
  Filled 2014-09-16 (×4): qty 500

## 2014-09-16 MED ORDER — LEVALBUTEROL HCL 0.63 MG/3ML IN NEBU
0.6300 mg | INHALATION_SOLUTION | RESPIRATORY_TRACT | Status: DC
  Administered 2014-09-16 (×3): 0.63 mg via RESPIRATORY_TRACT
  Filled 2014-09-16 (×2): qty 3

## 2014-09-16 MED ORDER — INSULIN ASPART 100 UNIT/ML ~~LOC~~ SOLN
0.0000 [IU] | Freq: Three times a day (TID) | SUBCUTANEOUS | Status: DC
  Administered 2014-09-16: 5 [IU] via SUBCUTANEOUS
  Administered 2014-09-17 – 2014-09-18 (×3): 2 [IU] via SUBCUTANEOUS

## 2014-09-16 MED ORDER — ALUM & MAG HYDROXIDE-SIMETH 200-200-20 MG/5ML PO SUSP: 30.0000 mL | Freq: Four times a day (QID) | ORAL | Status: DC | PRN

## 2014-09-16 MED ORDER — INSULIN ASPART 100 UNIT/ML ~~LOC~~ SOLN: 0.0000 [IU] | Freq: Every day | SUBCUTANEOUS | Status: DC

## 2014-09-16 MED ORDER — DEXTROSE 5 % IV SOLN
1.0000 g | INTRAVENOUS | Status: DC
  Administered 2014-09-17 – 2014-09-19 (×3): 1 g via INTRAVENOUS
  Filled 2014-09-16 (×4): qty 10

## 2014-09-16 MED ORDER — ENOXAPARIN SODIUM 40 MG/0.4ML ~~LOC~~ SOLN
40.0000 mg | SUBCUTANEOUS | Status: DC
  Administered 2014-09-16 – 2014-09-18 (×3): 40 mg via SUBCUTANEOUS
  Filled 2014-09-16 (×2): qty 0.4

## 2014-09-16 MED ORDER — ACETAMINOPHEN 650 MG RE SUPP: 650.0000 mg | Freq: Four times a day (QID) | RECTAL | Status: DC | PRN

## 2014-09-16 NOTE — ED Provider Notes (Signed)
CSN: [id number]     Arrival date & time 09/16/14  1046 History   First MD Initiated Contact with Patient 09/16/14 1131     Chief Complaint  Patient presents with  . Shortness of Breath      HPI Pt was seen at 1140.  Per pt, c/o gradual onset and worsening of persistent cough, wheezing and SOB for the past 2 weeks, worse today. Describes his cough as productive of "yellow" sputum. Pt states this morning he was walking up/down stairs at church, then walking on an incline in a corridor when his symptoms worsened.  Describes his symptoms as "I think my COPD is acting up."  Has been using home MDI with transient relief. EMS states on their arrival to scene pt was visibly SOB, wheezing, pale, and diaphoretic with Sats 90% R/A. EMS gave neb and O2 N/C en route with improvement. Denies CP/palpitations, no back pain, no abd pain, no N/V/D, no fevers, no rash.     Past Medical History  Diagnosis Date  . AAA (abdominal aortic aneurysm)   . Carotid artery occlusion 2007    Left cea  . Stroke 05/2005     2 in 2007.  Weakness and numbness in left arm and leg  . Diverticulitis     last time 4 years ago  . Hypertension     per Dr Tenny Craw  . Shortness of breath     with exertion  . COPD (chronic obstructive pulmonary disease)    Past Surgical History  Procedure Laterality Date  . Carotid stent  2007    left internal carotid artery  . Carotid endarterectomy Left 2007  . Testicle torsion reduction      age of 53  . Abdominal aortic endovascular stent graft N/A 08/22/2012    Procedure: ABDOMINAL AORTIC ENDOVASCULAR STENT GRAFT;  Surgeon: Sherren Kerns, MD;  Location[facility] OR;  Service: Vascular;  Laterality: N/A;  . Abdominal aortagram N/A 03/10/2013    Procedure: ABDOMINAL Ronny Flurry;  Surgeon: Sherren Kerns, MD;  Location: [facility] CATH LAB;  Service: Cardiovascular;  Laterality: N/A;   Family History  Problem Relation Age of Onset  . Cancer Mother     Breast cancer  . Hypertension Mother   .  Diabetes Mother   . Diabetes Father    History  Substance Use Topics  . Smoking status: Current Every Day Smoker -- 1.00 packs/day for 50 years    Types: Cigarettes  . Smokeless tobacco: Former Neurosurgeon    Types: Snuff  . Alcohol Use: No    Review of Systems ROS: Statement: All systems negative except as marked or noted in the HPI; Constitutional: Negative for fever and chills. ; ; Eyes: Negative for eye pain, redness and discharge. ; ; ENMT: Negative for ear pain, hoarseness, nasal congestion, sinus pressure and sore throat. ; ; Cardiovascular: Negative for chest pain, palpitations, diaphoresis, and peripheral edema. ; ; Respiratory: +SOB, wheezing, cough. Negative for stridor. ; ; Gastrointestinal: Negative for nausea, vomiting, diarrhea, abdominal pain, blood in stool, hematemesis, jaundice and rectal bleeding. . ; ; Genitourinary: Negative for dysuria, flank pain and hematuria. ; ; Musculoskeletal: Negative for back pain and neck pain. Negative for swelling and trauma.; ; Skin: Negative for pruritus, rash, abrasions, blisters, bruising and skin lesion.; ; Neuro: Negative for headache, lightheadedness and neck stiffness. Negative for weakness, altered level of consciousness , altered mental status, extremity weakness, paresthesias, involuntary movement, seizure and syncope.      Allergies  Review  of patient's allergies indicates no known allergies.  Home Medications   Prior to Admission medications   Medication Sig Start Date End Date Taking? Authorizing Provider  albuterol (PROVENTIL HFA;VENTOLIN HFA) 108 (90 BASE) MCG/ACT inhaler Inhale 2 puffs into the lungs every 4 (four) hours as needed for wheezing or shortness of breath. 07/05/14  Yes [name], MD  albuterol (PROVENTIL) (2.5 MG/3ML) 0.083% nebulizer solution Use via neb q 4 hrs prn wheezing 07/18/14  Yes Campbell Riches, NP  aspirin 325 MG tablet Take 325 mg by mouth at bedtime.    Yes Historical Provider, MD  budesonide  (PULMICORT) 0.5 MG/2ML nebulizer solution Take 2 mLs (0.5 mg total) by nebulization 2 (two) times daily. 08/07/14  Yes Campbell Riches, NP  lisinopril (PRINIVIL,ZESTRIL) 2.5 MG tablet Take 1 tablet (2.5 mg total) by mouth daily. Patient taking differently: Take 2.5 mg by mouth at bedtime.  07/05/14  Yes [name], MD  naproxen sodium (ALEVE) 220 MG tablet Take 220 mg by mouth 2 (two) times daily as needed (pain).   Yes Historical Provider, MD  azithromycin (ZITHROMAX Z-PAK) 250 MG tablet Take 2 tablets (500 mg) on  Day 1,  followed by 1 tablet (250 mg) once daily on Days 2 through 5. Patient not taking: Reported on 09/16/2014 08/06/14   Campbell Riches, NP  benzonatate (TESSALON) 100 MG capsule Take 1 capsule (100 mg total) by mouth 3 (three) times daily as needed for cough. Patient not taking: Reported on 09/16/2014 07/18/14   Campbell Riches, NP  doxycycline (VIBRAMYCIN) 100 MG capsule Take 1 capsule (100 mg total) by mouth 2 (two) times daily. Patient not taking: Reported on 09/16/2014 07/07/14   Shon Baton, MD  predniSONE (DELTASONE) 20 MG tablet 3 po qd x 3 d then 2 po qd x 3 d then 1 po qd x 3 d Patient not taking: Reported on 09/16/2014 08/23/14   Campbell Riches, NP   BP 134/78 mmHg  Pulse 103  Temp(Src) 98.4 F (36.9 C) (Oral)  Resp 22  Ht 5\' 8"  (1.727 m)  Wt 204 lb (92.534 kg)  BMI 31.03 kg/m2  SpO2 96% Physical Exam  1145: Physical examination:  Nursing notes reviewed; Vital signs and O2 SAT reviewed;  Constitutional: Well developed, Well nourished, Well hydrated, Uncomfortable appearing.; Head:  Normocephalic, atraumatic; Eyes: EOMI, PERRL, No scleral icterus; ENMT: Mouth and pharynx normal, Mucous membranes moist; Neck: Supple, Full range of motion, No lymphadenopathy; Cardiovascular: Tachycardic rate and rhythm, No gallop; Respiratory: Breath sounds diminished & equal bilaterally, insp/exp wheezes bilat. Speaking short sentences, Tachypneic, sitting upright.; Chest:  Nontender, Movement normal; Abdomen: Soft, Nontender, Nondistended, Normal bowel sounds; Genitourinary: No CVA tenderness; Extremities: Pulses normal, No tenderness, No edema, No calf edema or asymmetry.; Neuro: AA&Ox3, Major CN grossly intact.  Speech clear. No gross focal motor or sensory deficits in extremities.; Skin: Color normal, Warm, Dry.   ED Course  Procedures     EKG Interpretation   Date/Time:  Sunday September 16 2014 10:59:00 EDT Ventricular Rate:  105 PR Interval:  140 QRS Duration: 74 QT Interval:  328 QTC Calculation: 433 R Axis:   35 Text Interpretation:  Sinus tachycardia When compared with ECG of  07/17/2014 No significant change was found Confirmed by [facility] ([facility]  MD,  Nicholos Johns [phone]) on 09/16/2014 11:47:25 AM      MDM  MDM Reviewed: previous chart, nursing note and vitals Reviewed previous: labs and ECG Interpretation: labs, ECG and x-ray Total  time providing critical care: 30-74 minutes. This excludes time spent performing separately reportable procedures and services. Consults: admitting MD     CRITICAL CARE Performed by: Laray Anger Total critical care time: 35 Critical care time was exclusive of separately billable procedures and treating other patients. Critical care was necessary to treat or prevent imminent or life-threatening deterioration. Critical care was time spent personally by me on the following activities: development of treatment plan with patient and/or surrogate as well as nursing, discussions with consultants, evaluation of patient's response to treatment, examination of patient, obtaining history from patient or surrogate, ordering and performing treatments and interventions, ordering and review of laboratory studies, ordering and review of radiographic studies, pulse oximetry and re-evaluation of patient's condition.   Results for orders placed or performed during the hospital encounter of 09/16/14  CBC with Differential  Result  Value Ref Range   WBC 12.2 (H) 4.0 - 10.5 K/uL   RBC 4.81 4.22 - 5.81 MIL/uL   Hemoglobin 15.5 13.0 - 17.0 g/dL   HCT 16.1 09.6 - 04.5 %   MCV 99.8 78.0 - 100.0 fL   MCH 32.2 26.0 - 34.0 pg   MCHC 32.3 30.0 - 36.0 g/dL   RDW 40.9 81.1 - 91.4 %   Platelets 229 150 - 400 K/uL   Neutrophils Relative % 63 43 - 77 %   Neutro Abs 7.6 1.7 - 7.7 K/uL   Lymphocytes Relative 14 12 - 46 %   Lymphs Abs 1.7 0.7 - 4.0 K/uL   Monocytes Relative 7 3 - 12 %   Monocytes Absolute 0.9 0.1 - 1.0 K/uL   Eosinophils Relative 15 (H) 0 - 5 %   Eosinophils Absolute 1.8 (H) 0.0 - 0.7 K/uL   Basophils Relative 1 0 - 1 %   Basophils Absolute 0.1 0.0 - 0.1 K/uL  Basic metabolic panel  Result Value Ref Range   Sodium 139 135 - 145 mmol/L   Potassium 5.1 3.5 - 5.1 mmol/L   Chloride 105 101 - 111 mmol/L   CO2 29 22 - 32 mmol/L   Glucose, Bld 121 (H) 65 - 99 mg/dL   BUN 17 6 - 20 mg/dL   Creatinine, Ser 7.82 (H) 0.61 - 1.24 mg/dL   Calcium 8.8 (L) 8.9 - 10.3 mg/dL   GFR calc non Af Amer 49 (L) >60 mL/min   GFR calc Af Amer 57 (L) >60 mL/min   Anion gap 5 5 - 15  Troponin I  Result Value Ref Range   Troponin I <0.03 <0.031 ng/mL   Dg Chest 2 View 09/16/2014   CLINICAL DATA:  Shortness of breath for a few days. Productive cough for 2 weeks. History of COPD.  EXAM: CHEST  2 VIEW  COMPARISON:  Single view of the chest 07/17/2014. CT chest and single view of the chest 05/25/2005.  FINDINGS: The lungs are emphysematous but clear. Heart size is normal. No pneumothorax or pleural effusion. No focal bony abnormality.  IMPRESSION: Emphysema without acute disease.   Electronically Signed   By: Drusilla Kanner M.D.   On: 09/16/2014 11:32    1415:   On arrival: pt sitting upright, tachypneic, insp/exp wheezing. Hour long neb and IV solumedrol given. After neb completed: lungs continue diminished with scattered exp wheezing, no audible wheezing. O2 Sats at rest 90-92% R/A. Pt ambulated to bathroom with Sats dropping to  86-88% R/A, HR increasing to 120's, with pt having increasing work of breathing, appearing SOB and tachypneic. Pt escorted  back to stretcher, O2 N/C applied. Pt's Sats now 96% on O2 2L N/C Dx and testing d/w pt and family.  Questions answered.  Verb understanding, agreeable to admit. T/C to [facility] Dr. Sherrie Mustache, case discussed, including:  HPI, pertinent PM/SHx, VS/PE, dx testing, ED course and treatment:  Agreeable to admit, requests to write temporary orders, obtain tele bed to team APAdmits.   Samuel Jester, DO 09/18/14 1707

## 2014-09-16 NOTE — ED Notes (Signed)
Pt reports has COPD and for the past 2 weeks has had a slight productive cough with yellow sputum and some SOB.  Reports woke up not feeling well this morning but went to church.  Reports after walking up a long ramp at church, pt became very sob, was pale, and diaphoretic.  EMS reports room air 02 sat was 90%.  Ems administered 02 at 3 liters,  solumedrol IV, and 2.5mg  albuterol.  Reports pt had expiratory wheezing throughout.  After treatment ems reports pt's color started "pinking up" and pt became less diaphoretic.  Pt alert and oriented, denies pain.

## 2014-09-16 NOTE — H&P (Signed)
[facility] History and Physical  JALIEN WEAKLAND ZOX:[id number] DOB: 1945-11-26 DOA: 09/16/2014  Referring physician: ED physician, Dr. Clarene Duke PCP: Lilyan Punt, MD   Chief Complaint: Worsening shortness of breath.  HPI: Blake Cherry is a 69 y.o. male with a history of COPD, AAA-status post stenting/grafting 07/2012, HTN, and previous stroke in 2007. He presents with shortness of breath. He acknowledges his COPD diagnosis. He has been treated for flareups with 4 courses of prednisone and several antibiotics over the last 2 months. He would improve, but when the antibiotic and prednisone were completed, his shortness of breath and chest congestion would return. His last course of prednisone and antibiotics was approximately 4 weeks ago. This morning, he woke up with some shortness of breath, but he used his albuterol inhaler and Pulmicort nebulizer. It helped. He proceeded to go to church. While at church, he became severely dyspneic and presyncopal. EMS was called. When they arrived on scene, the patient was visibly short of breath, wheezing, pale, and diaphoretic, and with oxygen saturation of 90% on room air. He was given a neb, Solu-Medrol, and oxygen. He has had a cough with productive yellow sputum. He denies chest pain or pleurisy. He denies swelling in his legs. He denies subjective fever or chills. He denies nausea, vomiting, or abdominal pain. He still smokes 2-3 cigarettes daily.  In the ED, he was mildly hypoxic with an oxygen saturation of 86% on room air. He was placed on a continuous albuterol nebulizer. He became tachycardic with a heart rate ranging in the 120s. He was mildly tachypneic. His chest x-ray revealed emphysema without acute disease. His EKG revealed sinus tachycardia with a heart rate of 105 bpm and no suspicious ST or T-wave abnormalities. His lab data were significant for a creatinine of 1.43, normal troponin I, and a CBC of 12.2. He is being admitted for further  evaluation and management.     Review of Systems:  As above in history present illness. In addition, he occasionally has abdominal cramping and cramping in his extremities; he denies  PND like symptoms. Otherwise review of systems is negative.  Past Medical History  Diagnosis Date  . AAA (abdominal aortic aneurysm)   . Carotid artery occlusion 2007    Left cea  . Stroke 05/2005     2 in 2007.  Weakness and numbness in left arm and leg  . Diverticulitis     last time 4 years ago  . Hypertension     per Dr Tenny Craw  . Shortness of breath     with exertion  . COPD (chronic obstructive pulmonary disease)    Past Surgical History  Procedure Laterality Date  . Carotid stent  2007    left internal carotid artery  . Carotid endarterectomy Left 2007  . Testicle torsion reduction      age of 49  . Abdominal aortic endovascular stent graft N/A 08/22/2012    Procedure: ABDOMINAL AORTIC ENDOVASCULAR STENT GRAFT;  Surgeon: Sherren Kerns, MD;  Location: [facility] OR;  Service: Vascular;  Laterality: N/A;  . Abdominal aortagram N/A 03/10/2013    Procedure: ABDOMINAL Ronny Flurry;  Surgeon: Sherren Kerns, MD;  Location: [facility] CATH LAB;  Service: Cardiovascular;  Laterality: N/A;   Social History: Patient is married. He lives in [place] with his wife. He is a Retail banker at [employer]. He has a 50-pack-year smoking history, but over the past couple years, he has decreased his smoking to 2-3 cigarettes daily. He denies illicit  drug use or alcohol use.    No Known Allergies  Family History  Problem Relation Age of Onset  . Cancer Mother     Breast cancer  . Hypertension Mother   . Diabetes Mother   . Diabetes Father     Prior to Admission medications   Medication Sig Start Date End Date Taking? Authorizing Provider  albuterol (PROVENTIL HFA;VENTOLIN HFA) 108 (90 BASE) MCG/ACT inhaler Inhale 2 puffs into the lungs every 4 (four) hours as needed for wheezing or shortness of breath. 07/05/14  Yes  [name], MD  albuterol (PROVENTIL) (2.5 MG/3ML) 0.083% nebulizer solution Use via neb q 4 hrs prn wheezing 07/18/14  Yes Campbell Riches, NP  aspirin 325 MG tablet Take 325 mg by mouth at bedtime.    Yes Historical Provider, MD  budesonide (PULMICORT) 0.5 MG/2ML nebulizer solution Take 2 mLs (0.5 mg total) by nebulization 2 (two) times daily. 08/07/14  Yes Campbell Riches, NP  lisinopril (PRINIVIL,ZESTRIL) 2.5 MG tablet Take 1 tablet (2.5 mg total) by mouth daily. Patient taking differently: Take 2.5 mg by mouth at bedtime.  07/05/14  Yes [name], MD  azithromycin (ZITHROMAX Z-PAK) 250 MG tablet Take 2 tablets (500 mg) on  Day 1,  followed by 1 tablet (250 mg) once daily on Days 2 through 5. Patient not taking: Reported on 09/16/2014 08/06/14   Campbell Riches, NP  benzonatate (TESSALON) 100 MG capsule Take 1 capsule (100 mg total) by mouth 3 (three) times daily as needed for cough. Patient not taking: Reported on 09/16/2014 07/18/14   Campbell Riches, NP  doxycycline (VIBRAMYCIN) 100 MG capsule Take 1 capsule (100 mg total) by mouth 2 (two) times daily. Patient not taking: Reported on 09/16/2014 07/07/14   Shon Baton, MD  predniSONE (DELTASONE) 20 MG tablet 3 po qd x 3 d then 2 po qd x 3 d then 1 po qd x 3 d Patient not taking: Reported on 09/16/2014 08/23/14   Campbell Riches, NP   Physical Exam: Filed Vitals:   09/16/14 1330 09/16/14 1400 09/16/14 1430 09/16/14 1543  BP: 125/76 114/82 134/72 139/72  Pulse: 122 118 118 111  Temp:    98.1 F (36.7 C)  TempSrc:    Oral  Resp: 25 21 29 20   Height:    5\' 8"  (1.727 m)  Weight:    92.307 kg (203 lb 8 oz)  SpO2: 86% 96% 93% 97%    Wt Readings from Last 3 Encounters:  09/16/14 92.307 kg (203 lb 8 oz)  08/23/14 92.534 kg (204 lb)  07/18/14 94.915 kg (209 lb 4 oz)    General: Alert obese 69 year old Caucasian man who is mildly dyspneic, but not in respiratory extremis. Eyes: PERRL, normal lids, irises & conjunctiva;  conjunctivae are clear and sclerae are white. ENT: grossly normal hearing, lips & tongue; oropharynx is mildly dry. No posterior pharyngeal exudates or erythema. Neck: no LAD, masses or thyromegaly. Cardiovascular: S1, S2, with tachycardia. No LE edema. Telemetry: Sinus tachycardia.  Respiratory: Fine wheezes in the mid lobes; breathing mildly dyspneic with speaking in complete sentences. No obvious accessory muscles used for breathing with exception of intercostals. Abdomen: soft, positive bowel sounds, obese, nontender, nondistended. Skin: no rash or induration seen on limited exam Musculoskeletal: grossly normal tone BUE/BLE Psychiatric: grossly normal mood and affect, speech fluent and appropriate Neurologic: grossly non-focal; cranial nerves II through XII are intact.  Labs on Admission:  Basic Metabolic Panel:  Recent Labs Lab 09/16/14 1110  NA 139  K 5.1  CL 105  CO2 29  GLUCOSE 121*  BUN 17  CREATININE 1.43*  CALCIUM 8.8*   Liver Function Tests: No results for input(s): AST, ALT, ALKPHOS, BILITOT, PROT, ALBUMIN in the last 168 hours. No results for input(s): LIPASE, AMYLASE in the last 168 hours. No results for input(s): AMMONIA in the last 168 hours. CBC:  Recent Labs Lab 09/16/14 1110  WBC 12.2*  NEUTROABS 7.6  HGB 15.5  HCT 48.0  MCV 99.8  PLT 229   Cardiac Enzymes:  Recent Labs Lab 09/16/14 1110  TROPONINI <0.03    BNP (last 3 results) No results for input(s): BNP in the last 8760 hours.  ProBNP (last 3 results) No results for input(s): PROBNP in the last 8760 hours.  CBG: No results for input(s): GLUCAP in the last 168 hours.  Radiological Exams on Admission: Dg Chest 2 View  09/16/2014   CLINICAL DATA:  Shortness of breath for a few days. Productive cough for 2 weeks. History of COPD.  EXAM: CHEST  2 VIEW  COMPARISON:  Single view of the chest 07/17/2014. CT chest and single view of the chest 05/25/2005.  FINDINGS: The lungs are  emphysematous but clear. Heart size is normal. No pneumothorax or pleural effusion. No focal bony abnormality.  IMPRESSION: Emphysema without acute disease.   Electronically Signed   By: Drusilla Kanner M.D.   On: 09/16/2014 11:32    EKG: Independently reviewed.   Assessment/Plan Principal Problem:   COPD with acute exacerbation Active Problems:   Acute respiratory failure with hypoxia   Tobacco abuse   AKI (acute kidney injury)   Essential hypertension, benign   Obesity   CKD (chronic kidney disease), stage II   Hyperglycemia, drug-induced   1. Acute respiratory failure with hypoxia secondary to COPD with exacerbation. Per patient's history, he has been treated several times for exacerbations over the past 2 months with antibiotics and steroids. He would improve with treatment and then decompensate once the treatment was completed. His PCP, Dr. Gerda Diss referred him to pulmonologist Dr. Juanetta Gosling, but the patient does not have an appointment until August. Patient was mildly hypoxic in the ED. His chest x-ray revealed no infiltrate or edema, but with emphysematous changes. For treatment, he was given 125 mg Solu-Medrol by EMS, a continuous albuterol nebulizer in the ED, and started on azithromycin and Rocephin. We will continue treatment with antibiotics, Solu-Medrol, Pulmicort nebulizer, Xopenex nebulizer-secondary to the tachycardia; Spiriva, and Tussionex for cough. Will continue oxygen started in the ED. The patient was strongly advised to stop smoking altogether. We'll consult Dr. Juanetta Gosling. 2. Tobacco abuse. The patient was congratulated on decreasing the number of cigarettes he smokes, but he was encouraged to stop altogether. 3. Acute kidney injury, likely superimposed on mild chronic kidney disease. The patient's creatinine is 1.43 on admission. 2 months ago, it ranged from 1.26-1.37. He is treated with lisinopril chronically. Will treat with gentle IV fluid hydration for 24 hours, but will  continue lisinopril and monitor his renal function. 4. Essential hypertension. Currently stable. We'll continue lisinopril. 5. Sinus tachycardia. Patient's heart rate was in the 120s, which is being attributed to respiratory distress and continuous beta agonist nebulizer with albuterol for 1 hour. Will order TSH for further evaluation. 6. Hyperglycemia. The patient's venous glucose was 121 on admission. He denies history of diabetes. The elevation is likely steroid-induced. Will treat with sliding  scale NovoLog and Lantus. Will order hemoglobin A1c.    Code Status: Full code DVT Prophylaxis: Lovenox Family Communication: Discussed with wife Disposition Plan: Discharge when clinically appropriate.  Time spent: One hour  [facility] Pager [phone]

## 2014-09-16 NOTE — H&P (Deleted)
[facility] History and Physical  Blake Cherry ZOX:[id number] DOB: 1945-11-26 DOA: 09/16/2014  Referring physician: ED physician, Dr. Clarene Duke PCP: Lilyan Punt, MD   Chief Complaint: Worsening shortness of breath.  HPI: Blake Cherry is a 69 y.o. male with a history of COPD, AAA-status post stenting/grafting 07/2012, HTN, and previous stroke in 2007. He presents with shortness of breath. He acknowledges his COPD diagnosis. He has been treated for flareups with 4 courses of prednisone and several antibiotics over the last 2 months. He would improve, but when the antibiotic and prednisone were completed, his shortness of breath and chest congestion would return. His last course of prednisone and antibiotics was approximately 4 weeks ago. This morning, he woke up with some shortness of breath, but he used his albuterol inhaler and Pulmicort nebulizer. It helped. He proceeded to go to church. While at church, he became severely dyspneic and presyncopal. EMS was called. When they arrived on scene, the patient was visibly short of breath, wheezing, pale, and diaphoretic, and with oxygen saturation of 90% on room air. He was given a neb, Solu-Medrol, and oxygen. He has had a cough with productive yellow sputum. He denies chest pain or pleurisy. He denies swelling in his legs. He denies subjective fever or chills. He denies nausea, vomiting, or abdominal pain. He still smokes 2-3 cigarettes daily.  In the ED, he was mildly hypoxic with an oxygen saturation of 86% on room air. He was placed on a continuous albuterol nebulizer. He became tachycardic with a heart rate ranging in the 120s. He was mildly tachypneic. His chest x-ray revealed emphysema without acute disease. His EKG revealed sinus tachycardia with a heart rate of 105 bpm and no suspicious ST or T-wave abnormalities. His lab data were significant for a creatinine of 1.43, normal troponin I, and a CBC of 12.2. He is being admitted for further  evaluation and management.     Review of Systems:  As above in history present illness. In addition, he occasionally has abdominal cramping and cramping in his extremities; he denies  PND like symptoms. Otherwise review of systems is negative.  Past Medical History  Diagnosis Date  . AAA (abdominal aortic aneurysm)   . Carotid artery occlusion 2007    Left cea  . Stroke 05/2005     2 in 2007.  Weakness and numbness in left arm and leg  . Diverticulitis     last time 4 years ago  . Hypertension     per Dr Tenny Craw  . Shortness of breath     with exertion  . COPD (chronic obstructive pulmonary disease)    Past Surgical History  Procedure Laterality Date  . Carotid stent  2007    left internal carotid artery  . Carotid endarterectomy Left 2007  . Testicle torsion reduction      age of 49  . Abdominal aortic endovascular stent graft N/A 08/22/2012    Procedure: ABDOMINAL AORTIC ENDOVASCULAR STENT GRAFT;  Surgeon: Sherren Kerns, MD;  Location: [facility] OR;  Service: Vascular;  Laterality: N/A;  . Abdominal aortagram N/A 03/10/2013    Procedure: ABDOMINAL Ronny Flurry;  Surgeon: Sherren Kerns, MD;  Location: [facility] CATH LAB;  Service: Cardiovascular;  Laterality: N/A;   Social History: Patient is married. He lives in [place] with his wife. He is a Retail banker at [employer]. He has a 50-pack-year smoking history, but over the past couple years, he has decreased his smoking to 2-3 cigarettes daily. He denies illicit  drug use or alcohol use.    No Known Allergies  Family History  Problem Relation Age of Onset  . Cancer Mother     Breast cancer  . Hypertension Mother   . Diabetes Mother   . Diabetes Father     Prior to Admission medications   Medication Sig Start Date End Date Taking? Authorizing Provider  albuterol (PROVENTIL HFA;VENTOLIN HFA) 108 (90 BASE) MCG/ACT inhaler Inhale 2 puffs into the lungs every 4 (four) hours as needed for wheezing or shortness of breath. 07/05/14  Yes  [name], MD  albuterol (PROVENTIL) (2.5 MG/3ML) 0.083% nebulizer solution Use via neb q 4 hrs prn wheezing 07/18/14  Yes Campbell Riches, NP  aspirin 325 MG tablet Take 325 mg by mouth at bedtime.    Yes Historical Provider, MD  budesonide (PULMICORT) 0.5 MG/2ML nebulizer solution Take 2 mLs (0.5 mg total) by nebulization 2 (two) times daily. 08/07/14  Yes Campbell Riches, NP  lisinopril (PRINIVIL,ZESTRIL) 2.5 MG tablet Take 1 tablet (2.5 mg total) by mouth daily. Patient taking differently: Take 2.5 mg by mouth at bedtime.  07/05/14  Yes [name], MD  azithromycin (ZITHROMAX Z-PAK) 250 MG tablet Take 2 tablets (500 mg) on  Day 1,  followed by 1 tablet (250 mg) once daily on Days 2 through 5. Patient not taking: Reported on 09/16/2014 08/06/14   Campbell Riches, NP  benzonatate (TESSALON) 100 MG capsule Take 1 capsule (100 mg total) by mouth 3 (three) times daily as needed for cough. Patient not taking: Reported on 09/16/2014 07/18/14   Campbell Riches, NP  doxycycline (VIBRAMYCIN) 100 MG capsule Take 1 capsule (100 mg total) by mouth 2 (two) times daily. Patient not taking: Reported on 09/16/2014 07/07/14   Shon Baton, MD  predniSONE (DELTASONE) 20 MG tablet 3 po qd x 3 d then 2 po qd x 3 d then 1 po qd x 3 d Patient not taking: Reported on 09/16/2014 08/23/14   Campbell Riches, NP   Physical Exam: Filed Vitals:   09/16/14 1323 09/16/14 1330 09/16/14 1400 09/16/14 1430  BP: 109/68 125/76 114/82 134/72  Pulse: 124 122 118 118  Temp:      TempSrc:      Resp: 23 25 21 29   Height:      Weight:      SpO2: 89% 86% 96% 93%    Wt Readings from Last 3 Encounters:  09/16/14 92.534 kg (204 lb)  08/23/14 92.534 kg (204 lb)  07/18/14 94.915 kg (209 lb 4 oz)    General: Alert obese 69 year old Caucasian man who is mildly dyspneic, but not in respiratory extremis. Eyes: PERRL, normal lids, irises & conjunctiva; conjunctivae are clear and sclerae are white. ENT: grossly normal  hearing, lips & tongue; oropharynx is mildly dry. No posterior pharyngeal exudates or erythema. Neck: no LAD, masses or thyromegaly. Cardiovascular: S1, S2, with tachycardia. No LE edema. Telemetry: Sinus tachycardia.  Respiratory: Fine wheezes in the mid lobes; breathing mildly dyspneic with speaking in complete sentences. No obvious accessory muscles used for breathing with exception of intercostals. Abdomen: soft, positive bowel sounds, obese, nontender, nondistended. Skin: no rash or induration seen on limited exam Musculoskeletal: grossly normal tone BUE/BLE Psychiatric: grossly normal mood and affect, speech fluent and appropriate Neurologic: grossly non-focal; cranial nerves II through XII are intact.           Labs on Admission:  Basic Metabolic Panel:  Recent Labs Lab 09/16/14 1110  NA 139  K 5.1  CL 105  CO2 29  GLUCOSE 121*  BUN 17  CREATININE 1.43*  CALCIUM 8.8*   Liver Function Tests: No results for input(s): AST, ALT, ALKPHOS, BILITOT, PROT, ALBUMIN in the last 168 hours. No results for input(s): LIPASE, AMYLASE in the last 168 hours. No results for input(s): AMMONIA in the last 168 hours. CBC:  Recent Labs Lab 09/16/14 1110  WBC 12.2*  NEUTROABS 7.6  HGB 15.5  HCT 48.0  MCV 99.8  PLT 229   Cardiac Enzymes:  Recent Labs Lab 09/16/14 1110  TROPONINI <0.03    BNP (last 3 results) No results for input(s): BNP in the last 8760 hours.  ProBNP (last 3 results) No results for input(s): PROBNP in the last 8760 hours.  CBG: No results for input(s): GLUCAP in the last 168 hours.  Radiological Exams on Admission: Dg Chest 2 View  09/16/2014   CLINICAL DATA:  Shortness of breath for a few days. Productive cough for 2 weeks. History of COPD.  EXAM: CHEST  2 VIEW  COMPARISON:  Single view of the chest 07/17/2014. CT chest and single view of the chest 05/25/2005.  FINDINGS: The lungs are emphysematous but clear. Heart size is normal. No pneumothorax or  pleural effusion. No focal bony abnormality.  IMPRESSION: Emphysema without acute disease.   Electronically Signed   By: Drusilla Kanner M.D.   On: 09/16/2014 11:32    EKG: Independently reviewed.   Assessment/Plan Principal Problem:   COPD with acute exacerbation Active Problems:   Acute respiratory failure with hypoxia   Tobacco abuse   AKI (acute kidney injury)   Essential hypertension, benign   Obesity   CKD (chronic kidney disease), stage II   Hyperglycemia, drug-induced   1. Acute respiratory failure with hypoxia secondary to COPD with exacerbation. Per patient's history, he has been treated several times for exacerbations over the past 2 months with antibiotics and steroids. He would improve with treatment and then decompensate once the treatment was completed. His PCP, Dr. Gerda Diss referred him to pulmonologist Dr. Juanetta Gosling, but the patient does not have an appointment until August. Patient was mildly hypoxic in the ED. His chest x-ray revealed no infiltrate or edema, but with emphysematous changes. For treatment, he was given 125 mg Solu-Medrol by EMS, a continuous albuterol nebulizer in the ED, and started on azithromycin and Rocephin. We will continue treatment with antibiotics, Solu-Medrol, Pulmicort nebulizer, Xopenex nebulizer-secondary to the tachycardia; Spiriva, and Tussionex for cough. Will continue oxygen started in the ED. The patient was strongly advised to stop smoking altogether. 2. Tobacco abuse. The patient was congratulated on decreasing the number of cigarettes he smokes, but he was encouraged to stop altogether. 3. Acute kidney injury, likely superimposed on mild chronic kidney disease. The patient's creatinine is 1.43 on admission. 2 months ago, it ranged from 1.26-1.37. He is treated with lisinopril chronically. Will treat with gentle IV fluid hydration for 24 hours, but will continue lisinopril and monitor his renal function. 4. Essential hypertension. Currently  stable. We'll continue lisinopril. 5. Sinus tachycardia. Patient's heart rate was in the 120s, which is being attributed to respiratory distress and continuous beta agonist nebulizer with albuterol for 1 hour. Will order TSH for further evaluation. 6. Hyperglycemia. The patient's venous glucose was 121 on admission. He denies history of diabetes. The elevation is likely steroid-induced. Will treat with sliding scale NovoLog and Lantus. Will order hemoglobin A1c.    Code Status: Full code DVT Prophylaxis: Lovenox  Family Communication: Discussed with wife Disposition Plan: Discharge when clinically appropriate.  Time spent: One hour  [facility] Pager [phone]

## 2014-09-16 NOTE — ED Notes (Signed)
Patient ambulated around 20 feet total to and from restroom. Upon completion patient pursed lip breathing, tachypnea and accessory muscle use. Oxygen 88% on RA, patient placed on 2L [place]. Rebounding to baseline.

## 2014-09-16 NOTE — ED Notes (Signed)
Hospitalist Fisher at bedside.

## 2014-09-16 NOTE — ED Notes (Signed)
Report given to [facility] on Dept 300 all questions anwered

## 2014-09-16 NOTE — ED Notes (Signed)
RT in setting up neb

## 2014-09-17 ENCOUNTER — Inpatient Hospital Stay (HOSPITAL_COMMUNITY): Payer: Medicare Other

## 2014-09-17 ENCOUNTER — Encounter: Payer: Self-pay | Admitting: Vascular Surgery

## 2014-09-17 DIAGNOSIS — R0600 Dyspnea, unspecified: Secondary | ICD-10-CM

## 2014-09-17 DIAGNOSIS — R739 Hyperglycemia, unspecified: Secondary | ICD-10-CM

## 2014-09-17 LAB — COMPREHENSIVE METABOLIC PANEL
ALT: 10 U/L — ABNORMAL LOW (ref 17–63)
AST: 14 U/L — ABNORMAL LOW (ref 15–41)
Albumin: 3.5 g/dL (ref 3.5–5.0)
Alkaline Phosphatase: 46 U/L (ref 38–126)
Anion gap: 4 — ABNORMAL LOW (ref 5–15)
BUN: 18 mg/dL (ref 6–20)
CO2: 28 mmol/L (ref 22–32)
Calcium: 8.6 mg/dL — ABNORMAL LOW (ref 8.9–10.3)
Chloride: 106 mmol/L (ref 101–111)
Creatinine, Ser: 1.24 mg/dL (ref 0.61–1.24)
GFR calc Af Amer: >60 mL/min (ref >60)
GFR calc non Af Amer: 58 mL/min — ABNORMAL LOW (ref >60)
Glucose, Bld: 146 mg/dL — ABNORMAL HIGH (ref 65–99)
Potassium: 5.5 mmol/L — ABNORMAL HIGH (ref 3.5–5.1)
Sodium: 138 mmol/L (ref 135–145)
Total Bilirubin: 0.6 mg/dL (ref 0.3–1.2)
Total Protein: 6.1 g/dL — ABNORMAL LOW (ref 6.5–8.1)

## 2014-09-17 LAB — CBC
HCT: 45.8 % (ref 39.0–52.0)
Hemoglobin: 14.8 g/dL (ref 13.0–17.0)
MCH: 32.5 pg (ref 26.0–34.0)
MCHC: 32.3 g/dL (ref 30.0–36.0)
MCV: 100.7 fL — ABNORMAL HIGH (ref 78.0–100.0)
Platelets: 250 10*3/uL (ref 150–400)
RBC: 4.55 MIL/uL (ref 4.22–5.81)
RDW: 13.8 % (ref 11.5–15.5)
WBC: 13.7 10*3/uL — ABNORMAL HIGH (ref 4.0–10.5)

## 2014-09-17 LAB — GLUCOSE, CAPILLARY
Glucose-Capillary: 136 mg/dL — ABNORMAL HIGH (ref 65–99)
Glucose-Capillary: 99 mg/dL (ref 65–99)
Glucose-Capillary: 139 mg/dL — ABNORMAL HIGH (ref 65–99)
Glucose-Capillary: 126 mg/dL — ABNORMAL HIGH (ref 65–99)

## 2014-09-17 LAB — TSH: TSH: 0.745 u[IU]/mL (ref 0.350–4.500)

## 2014-09-17 MED ORDER — LEVALBUTEROL HCL 0.63 MG/3ML IN NEBU
0.6300 mg | INHALATION_SOLUTION | RESPIRATORY_TRACT | Status: DC
  Administered 2014-09-17 – 2014-09-19 (×11): 0.63 mg via RESPIRATORY_TRACT
  Filled 2014-09-17 (×12): qty 3

## 2014-09-17 MED ORDER — IOHEXOL 350 MG/ML SOLN
100.0000 mL | Freq: Once | INTRAVENOUS | Status: AC | PRN
  Administered 2014-09-17: 100 mL via INTRAVENOUS

## 2014-09-17 MED ORDER — BUDESONIDE-FORMOTEROL FUMARATE 160-4.5 MCG/ACT IN AERO
2.0000 | INHALATION_SPRAY | Freq: Two times a day (BID) | RESPIRATORY_TRACT | Status: DC
  Administered 2014-09-17 – 2014-09-19 (×5): 2 via RESPIRATORY_TRACT
  Filled 2014-09-17: qty 6

## 2014-09-17 MED ORDER — SODIUM CHLORIDE 0.45 % IV SOLN
INTRAVENOUS | Status: DC
  Administered 2014-09-17 – 2014-09-19 (×3): via INTRAVENOUS

## 2014-09-17 NOTE — Progress Notes (Signed)
[facility] PROGRESS NOTE  Blake Cherry:[id number] DOB: [birth year] DOA: 09/16/2014 PCP: Lilyan Punt, MD    Code Status: Full code Family Communication: Discussed with his wife on 7/24 Disposition Plan: Discharge when clinically appropriate, likely in the next few days.   Consultants:  Pulmonology, Dr. Juanetta Gosling  Procedures:  2-D echocardiogram pending  Antibiotics:  Rocephin 7/24>>  Azithromycin 7/24>>  HPI/Subjective: Patient feels a little better. He is still short winded and has some wheezing. He feels less chest congestion.  Objective: Filed Vitals:   09/17/14 0545  BP: 124/71  Pulse: 89  Temp: 97.6 F (36.4 C)  Resp: 20   oxygen saturation 91% on supplement oxygen.  Intake/Output Summary (Last 24 hours) at 09/17/14 1317 Last data filed at 09/17/14 0946  Gross per 24 hour  Intake 1536.25 ml  Output    550 ml  Net 986.25 ml   Filed Weights   09/16/14 1054 09/16/14 1543 09/17/14 0500  Weight: 92.534 kg (204 lb) 92.307 kg (203 lb 8 oz) 92.715 kg (204 lb 6.4 oz)    Exam:   General:  Pleasant alert obese 69 year old man in no acute distress.  Cardiovascular: S1, S2, with a soft systolic murmur.  Respiratory: Mild faint diffuse wheezes; breathing mildly labored with speaking, but nonlabored at rest.  Abdomen: Obese, positive bowel sounds, soft, nontender, nondistended.  Musculoskeletal/extremities:: No pedal edema. No acute hot red joints.  Neurologic: He is alert and oriented 3.  Data Reviewed: Basic Metabolic Panel:  Recent Labs Lab 09/16/14 1110 09/17/14 0642  NA 139 138  K 5.1 5.5*  CL 105 106  CO2 29 28  GLUCOSE 121* 146*  BUN 17 18  CREATININE 1.43* 1.24  CALCIUM 8.8* 8.6*   Liver Function Tests:  Recent Labs Lab 09/17/14 0642  AST 14*  ALT 10*  ALKPHOS 46  BILITOT 0.6  PROT 6.1*  ALBUMIN 3.5   No results for input(s): LIPASE, AMYLASE in the last 168 hours. No results for input(s): AMMONIA in the last 168  hours. CBC:  Recent Labs Lab 09/16/14 1110 09/17/14 0642  WBC 12.2* 13.7*  NEUTROABS 7.6  --   HGB 15.5 14.8  HCT 48.0 45.8  MCV 99.8 100.7*  PLT 229 250   Cardiac Enzymes:  Recent Labs Lab 09/16/14 1110  TROPONINI <0.03   BNP (last 3 results) No results for input(s): BNP in the last 8760 hours.  ProBNP (last 3 results) No results for input(s): PROBNP in the last 8760 hours.  CBG:  Recent Labs Lab 09/16/14 1601 09/16/14 2156 09/17/14 0735 09/17/14 1123  GLUCAP 228* 123* 136* 99    No results found for this or any previous visit (from the past 240 hour(s)).   Studies: Dg Chest 2 View  09/16/2014   CLINICAL DATA:  Shortness of breath for a few days. Productive cough for 2 weeks. History of COPD.  EXAM: CHEST  2 VIEW  COMPARISON:  Single view of the chest 07/17/2014. CT chest and single view of the chest 05/25/2005.  FINDINGS: The lungs are emphysematous but clear. Heart size is normal. No pneumothorax or pleural effusion. No focal bony abnormality.  IMPRESSION: Emphysema without acute disease.   Electronically Signed   By: Drusilla Kanner M.D.   On: 09/16/2014 11:32    Scheduled Meds: . aspirin  325 mg Oral QHS  . azithromycin  500 mg Intravenous Q24H  . budesonide-formoterol  2 puff Inhalation BID  . cefTRIAXone (ROCEPHIN)  IV  1 g Intravenous Q24H  .  chlorpheniramine-HYDROcodone  5 mL Oral Q12H  . enoxaparin (LOVENOX) injection  40 mg Subcutaneous Q24H  . famotidine  20 mg Oral Daily  . guaiFENesin  600 mg Oral BID  . insulin aspart  0-15 Units Subcutaneous TID WC  . insulin aspart  0-5 Units Subcutaneous QHS  . insulin glargine  10 Units Subcutaneous QHS  . levalbuterol  0.63 mg Nebulization Q4H WA  . lisinopril  2.5 mg Oral QHS  . methylPREDNISolone (SOLU-MEDROL) injection  80 mg Intravenous 3 times per day  . tiotropium  18 mcg Inhalation Daily   Continuous Infusions: . 0.9 % NaCl with KCl 20 mEq / L 75 mL/hr at 09/17/14 0459   Assessment and  plan:  Principal Problem:   COPD with acute exacerbation Active Problems:   Acute respiratory failure with hypoxia   Tobacco abuse   AKI (acute kidney injury)   Essential hypertension, benign   Obesity   CKD (chronic kidney disease), stage II   Hyperglycemia, drug-induced    Acute respiratory failure with hypoxia secondary to COPD with exacerbation.  Per patient's history, he had been treated several times for exacerbations over the past 2 months with antibiotics and steroids. He was subsequently referred to Dr. Juanetta Gosling by his PCP, but the appointment was not until August.  Patient was mildly hypoxic in the ED. His chest x-ray revealed no infiltrate or edema, but with emphysematous changes.  -For treatment, he was given 125 mg Solu-Medrol by EMS, a continuous albuterol nebulizer in the ED, and started on azithromycin and Rocephin.  -We'll continue Rocephin/azithromycin, Xopenex, Spiriva, Solu-Medrol, and Tussionex. -Dr. Juanetta Gosling evaluated the patient and discontinued Pulmicort in favor of Symbicort. He ordered a 2-D echocardiogram for further evaluation. -Patient is still symptomatic. Tobacco abuse. The patient was commended on decreasing the number of cigarettes he smokes, but he was encouraged to stop altogether. Acute kidney injury,  -The patient's creatinine is 1.43 on admission. 2 months ago, it ranged from 1.26-1.37. He is treated with lisinopril chronically.  -He likely has stage II was stage III chronic kidney disease.  -His creatinine has improved with gentle IV fluid hydration.  Hyperkalemia. The patient's serum potassium has increased to 5.5. Will discontinue potassium in IV fluids. Will start half-normal saline for treatment and will hold lisinopril until his blood pressure becomes hypertensive. Essential hypertension. Patient's blood pressure is within normal limits. He is treated with lisinopril chronically. This will be held given his mild hyperkalemia. History of AAA  repair.  The patient has a follow-up appointment with his surgeon Dr. Darrick Penna later on in the week. -Echocardiogram pending. Sinus tachycardia . Patient's heart rate was in the 120s on admission, which was attributed to respiratory distress and continuous beta agonist nebulizer with albuterol for 1 hour. His TSH is within normal limits.    Hyperglycemia.  The patient's venous glucose was 121 on admission. He denied history of diabetes. The elevation is likely steroid-induced. Will continue sliding scale NovoLog and Lantus. Will order hemoglobin A1c.     Time spent: 35 minutes    [facility]  [facility] Pager [phone]. If 7PM-7AM, please contact night-coverage at [email or web address], password [facility] 09/17/2014, 1:17 PM  LOS: 1 day

## 2014-09-17 NOTE — Care Management Note (Signed)
Case Management Note  Patient Details  Name: Blake Cherry MRN: [id number] Date of Birth: Jan 23, 1946  Subjective/Objective:                  Pt admitted from home with COPD exacerbation. Pt lives with his wife and will return home at discharge. Pt is independent with ADL's. Pt has a neb machine for home use.  Action/Plan: No CM needs noted.  Expected Discharge Date:  09/19/14               Expected Discharge Plan:  Home/Self Care  In-House Referral:  NA  Discharge planning Services  CM Consult  Post Acute Care Choice:  NA Choice offered to:  NA  DME Arranged:    DME Agency:     HH Arranged:    HH Agency:     Status of Service:  Completed, signed off  Medicare Important Message Given:    Date Medicare IM Given:    Medicare IM give by:    Date Additional Medicare IM Given:    Additional Medicare Important Message give by:     If discussed at Long Length of Stay Meetings, dates discussed:    Additional Comments:  Cheryl Flash, RN 09/17/2014, 12:13 PM

## 2014-09-17 NOTE — Consult Note (Signed)
Consult requested by: Dr. Sherrie Mustache Consult requested for COPD:  HPI: This is a 69 year old who has multiple medical problems including COPD previous abdominal aortic aneurysm and previous stroke. He has been having increasing problems with shortness of breath and exacerbations of COPD over the last 2 months. He has had multiple bouts requiring prednisone and antibiotics but he still is having trouble. He started having trouble on the morning of admission used his albuterol inhaler and it seemed to help but he became severely dyspneic later. EMS was called and he was found to be visibly short of breath wheezing pale diaphoretic and hypoxic. He says he has a cough with yellow sputum. He has no other new complaints. He has about a 50-pack-year smoking history but has recently reduced his smoking to 2-3 cigarettes daily.  Past Medical History  Diagnosis Date  . AAA (abdominal aortic aneurysm)   . Carotid artery occlusion 2007    Left cea  . Stroke 05/2005     2 in 2007.  Weakness and numbness in left arm and leg  . Diverticulitis     last time 4 years ago  . Hypertension     per Dr Tenny Craw  . Shortness of breath     with exertion  . COPD (chronic obstructive pulmonary disease)      Family History  Problem Relation Age of Onset  . Cancer Mother     Breast cancer  . Hypertension Mother   . Diabetes Mother   . Diabetes Father      History   Social History  . Marital Status: Married    Spouse Name: N/A  . Number of Children: N/A  . Years of Education: N/A   Social History Main Topics  . Smoking status: Current Every Day Smoker -- 1.00 packs/day for 50 years    Types: Cigarettes  . Smokeless tobacco: Former Neurosurgeon    Types: Snuff  . Alcohol Use: No  . Drug Use: No  . Sexual Activity: Not on file   Other Topics Concern  . None   Social History Narrative     ROS: He has not had any swelling of his legs. No chest pain and no hemoptysis otherwise per the history and  physical    Objective: Vital signs in last 24 hours: Temp:  [97.6 F (36.4 C)-98.6 F (37 C)] 97.6 F (36.4 C) (07/25 0545) Pulse Rate:  [54-124] 89 (07/25 0545) Resp:  [16-29] 20 (07/25 0545) BP: (109-146)/(61-82) 124/71 mmHg (07/25 0545) SpO2:  [86 %-100 %] 93 % (07/25 0754) Weight:  [92.307 kg (203 lb 8 oz)-92.715 kg (204 lb 6.4 oz)] 92.715 kg (204 lb 6.4 oz) (07/25 0500) Weight change:  Last BM Date: 09/16/14  Intake/Output from previous day: 07/24 0701 - 07/25 0700 In: 1296.3 [P.O.:240; I.V.:1056.3] Out: 550 [Urine:550]  PHYSICAL EXAM He is awake and alert. He is mildly short of breath with conversation. His pupils are reactive nodes and throat are clear mucous membranes are moist his chest shows wheezes bilaterally. His heart is regular without gallop. His abdomen is soft no masses are felt. Extremities showed no edema. Central nervous system examination is grossly intact  Lab Results: Basic Metabolic Panel:  Recent Labs  40/98/11 1110 09/17/14 0642  NA 139 138  K 5.1 5.5*  CL 105 106  CO2 29 28  GLUCOSE 121* 146*  BUN 17 18  CREATININE 1.43* 1.24  CALCIUM 8.8* 8.6*   Liver Function Tests:  Recent Labs  09/17/14 502 745 0197  AST 14*  ALT 10*  ALKPHOS 46  BILITOT 0.6  PROT 6.1*  ALBUMIN 3.5   No results for input(s): LIPASE, AMYLASE in the last 72 hours. No results for input(s): AMMONIA in the last 72 hours. CBC:  Recent Labs  09/16/14 1110 09/17/14 0642  WBC 12.2* 13.7*  NEUTROABS 7.6  --   HGB 15.5 14.8  HCT 48.0 45.8  MCV 99.8 100.7*  PLT 229 250   Cardiac Enzymes:  Recent Labs  09/16/14 1110  TROPONINI <0.03   BNP: No results for input(s): PROBNP in the last 72 hours. D-Dimer: No results for input(s): DDIMER in the last 72 hours. CBG:  Recent Labs  09/16/14 1601 09/16/14 2156 09/17/14 0735  GLUCAP 228* 123* 136*   Hemoglobin A1C: No results for input(s): HGBA1C in the last 72 hours. Fasting Lipid Panel: No results for  input(s): CHOL, HDL, LDLCALC, TRIG, CHOLHDL, LDLDIRECT in the last 72 hours. Thyroid Function Tests:  Recent Labs  09/17/14 0642  TSH 0.745   Anemia Panel: No results for input(s): VITAMINB12, FOLATE, FERRITIN, TIBC, IRON, RETICCTPCT in the last 72 hours. Coagulation: No results for input(s): LABPROT, INR in the last 72 hours. Urine Drug Screen: Drugs of Abuse  No results found for: LABOPIA, COCAINSCRNUR, LABBENZ, AMPHETMU, THCU, LABBARB  Alcohol Level: No results for input(s): ETH in the last 72 hours. Urinalysis: No results for input(s): COLORURINE, LABSPEC, PHURINE, GLUCOSEU, HGBUR, BILIRUBINUR, KETONESUR, PROTEINUR, UROBILINOGEN, NITRITE, LEUKOCYTESUR in the last 72 hours.  Invalid input(s): APPERANCEUR Misc. Labs:   ABGS: No results for input(s): PHART, PO2ART, TCO2, HCO3 in the last 72 hours.  Invalid input(s): PCO2   MICROBIOLOGY: No results found for this or any previous visit (from the past 240 hour(s)).  Studies/Results: Dg Chest 2 View  09/16/2014   CLINICAL DATA:  Shortness of breath for a few days. Productive cough for 2 weeks. History of COPD.  EXAM: CHEST  2 VIEW  COMPARISON:  Single view of the chest 07/17/2014. CT chest and single view of the chest 05/25/2005.  FINDINGS: The lungs are emphysematous but clear. Heart size is normal. No pneumothorax or pleural effusion. No focal bony abnormality.  IMPRESSION: Emphysema without acute disease.   Electronically Signed   By: Drusilla Kanner M.D.   On: 09/16/2014 11:32    Medications:  Prior to Admission:  Prescriptions prior to admission  Medication Sig Dispense Refill Last Dose  . albuterol (PROVENTIL HFA;VENTOLIN HFA) 108 (90 BASE) MCG/ACT inhaler Inhale 2 puffs into the lungs every 4 (four) hours as needed for wheezing or shortness of breath. 1 Inhaler 3 09/16/2014 at 1000  . albuterol (PROVENTIL) (2.5 MG/3ML) 0.083% nebulizer solution Use via neb q 4 hrs prn wheezing 50 vial 2 09/16/2014 at 0700  . aspirin 325  MG tablet Take 325 mg by mouth at bedtime.    09/15/2014 at Unknown time  . budesonide (PULMICORT) 0.5 MG/2ML nebulizer solution Take 2 mLs (0.5 mg total) by nebulization 2 (two) times daily. 120 mL 12 09/16/2014 at 0900  . lisinopril (PRINIVIL,ZESTRIL) 2.5 MG tablet Take 1 tablet (2.5 mg total) by mouth daily. (Patient taking differently: Take 2.5 mg by mouth at bedtime. ) 90 tablet 2 09/15/2014 at Unknown time  . [DISCONTINUED] naproxen sodium (ALEVE) 220 MG tablet Take 220 mg by mouth 2 (two) times daily as needed (pain).   09/15/2014 at Unknown time  . azithromycin (ZITHROMAX Z-PAK) 250 MG tablet Take 2 tablets (500 mg) on  Day 1,  followed by 1  tablet (250 mg) once daily on Days 2 through 5. (Patient not taking: Reported on 09/16/2014) 6 each 0 Completed Course at Unknown time  . benzonatate (TESSALON) 100 MG capsule Take 1 capsule (100 mg total) by mouth 3 (three) times daily as needed for cough. (Patient not taking: Reported on 09/16/2014) 30 capsule 2 Completed Course at Unknown time  . doxycycline (VIBRAMYCIN) 100 MG capsule Take 1 capsule (100 mg total) by mouth 2 (two) times daily. (Patient not taking: Reported on 09/16/2014) 20 capsule 0 Completed Course at Unknown time  . predniSONE (DELTASONE) 20 MG tablet 3 po qd x 3 d then 2 po qd x 3 d then 1 po qd x 3 d (Patient not taking: Reported on 09/16/2014) 18 tablet 0 Completed Course at Unknown time   Scheduled: . aspirin  325 mg Oral QHS  . azithromycin  500 mg Intravenous Q24H  . budesonide  0.5 mg Nebulization BID  . cefTRIAXone (ROCEPHIN)  IV  1 g Intravenous Q24H  . chlorpheniramine-HYDROcodone  5 mL Oral Q12H  . enoxaparin (LOVENOX) injection  40 mg Subcutaneous Q24H  . famotidine  20 mg Oral Daily  . guaiFENesin  600 mg Oral BID  . insulin aspart  0-15 Units Subcutaneous TID WC  . insulin aspart  0-5 Units Subcutaneous QHS  . insulin glargine  10 Units Subcutaneous QHS  . levalbuterol  0.63 mg Nebulization Q4H WA  . lisinopril  2.5 mg  Oral QHS  . methylPREDNISolone (SOLU-MEDROL) injection  80 mg Intravenous 3 times per day  . tiotropium  18 mcg Inhalation Daily   Continuous: . 0.9 % NaCl with KCl 20 mEq / L 75 mL/hr at 09/17/14 0459   VWU:[id number] **OR** acetaminophen, alum & mag hydroxide-simeth, benzonatate, ondansetron **OR** ondansetron (ZOFRAN) IV  Assesment: He was admitted with COPD with exacerbation and acute hypoxic respiratory failure. He has not had pulmonary function testing. He is being scheduled for CT chest for lung cancer screening but he may need a more comprehensive CT. I will discuss with the lung cancer screening program. He needs to have PFTs as an outpatient. I think he needs to be on long-acting bronchodilator treatment and I will start that. Because of his vascular disease I think we should check and make sure he has normal left ventricular function and does not have significant valvular heart disease that may be causing his shortness of breath Principal Problem:   COPD with acute exacerbation Active Problems:   Essential hypertension, benign   Tobacco abuse   Obesity   AKI (acute kidney injury)   CKD (chronic kidney disease), stage II   Hyperglycemia, drug-induced   Acute respiratory failure with hypoxia    Plan: Start Symbicort and Spiriva. Discontinue Pulmicort PFTs as an outpatient. I will discuss with the lung cancer screening program but I think he needs a CT.  Thanks for allowing me to see him with you    LOS: 1 day   Winfred Redel L 09/17/2014, 8:34 AM

## 2014-09-18 DIAGNOSIS — Z720 Tobacco use: Secondary | ICD-10-CM

## 2014-09-18 LAB — GLUCOSE, CAPILLARY
Glucose-Capillary: 108 mg/dL — ABNORMAL HIGH (ref 65–99)
Glucose-Capillary: 110 mg/dL — ABNORMAL HIGH (ref 65–99)
Glucose-Capillary: 128 mg/dL — ABNORMAL HIGH (ref 65–99)
Glucose-Capillary: 135 mg/dL — ABNORMAL HIGH (ref 65–99)

## 2014-09-18 LAB — CBC
HCT: 44.7 % (ref 39.0–52.0)
Hemoglobin: 14.4 g/dL (ref 13.0–17.0)
MCH: 32.7 pg (ref 26.0–34.0)
MCHC: 32.2 g/dL (ref 30.0–36.0)
MCV: 101.4 fL — ABNORMAL HIGH (ref 78.0–100.0)
Platelets: 276 10*3/uL (ref 150–400)
RBC: 4.41 MIL/uL (ref 4.22–5.81)
RDW: 13.9 % (ref 11.5–15.5)
WBC: 17.9 10*3/uL — ABNORMAL HIGH (ref 4.0–10.5)

## 2014-09-18 LAB — HEMOGLOBIN A1C
Hgb A1c MFr Bld: 6.3 % — ABNORMAL HIGH (ref 4.8–5.6)
Mean Plasma Glucose: 134 mg/dL

## 2014-09-18 LAB — BASIC METABOLIC PANEL
Anion gap: 7 (ref 5–15)
BUN: 28 mg/dL — ABNORMAL HIGH (ref 6–20)
CO2: 27 mmol/L (ref 22–32)
Calcium: 8.8 mg/dL — ABNORMAL LOW (ref 8.9–10.3)
Chloride: 104 mmol/L (ref 101–111)
Creatinine, Ser: 1.41 mg/dL — ABNORMAL HIGH (ref 0.61–1.24)
GFR calc Af Amer: 58 mL/min — ABNORMAL LOW (ref >60)
GFR calc non Af Amer: 50 mL/min — ABNORMAL LOW (ref >60)
Glucose, Bld: 126 mg/dL — ABNORMAL HIGH (ref 65–99)
Potassium: 5 mmol/L (ref 3.5–5.1)
Sodium: 138 mmol/L (ref 135–145)

## 2014-09-18 NOTE — Progress Notes (Signed)
Subjective: He says he feels better. He has no new complaints. His breathing has improved  Objective: Vital signs in last 24 hours: Temp:  [97.7 F (36.5 C)-98.7 F (37.1 C)] 97.7 F (36.5 C) (07/26 0524) Pulse Rate:  [90-102] 95 (07/26 0524) Resp:  [20] 20 (07/26 0524) BP: (119-130)/(53-68) 130/68 mmHg (07/26 0524) SpO2:  [88 %-98 %] 94 % (07/26 0524) Weight:  [93.577 kg (206 lb 4.8 oz)] 93.577 kg (206 lb 4.8 oz) (07/26 0524) Weight change: 1.043 kg (2 lb 4.8 oz) Last BM Date: 09/16/14  Intake/Output from previous day: 07/25 0701 - 07/26 0700 In: 480 [P.O.:480] Out: 1750 [Urine:1750]  PHYSICAL EXAM General appearance: alert, cooperative and no distress Resp: clear to auscultation bilaterally Cardio: regular rate and rhythm, S1, S2 normal, no murmur, click, rub or gallop GI: soft, non-tender; bowel sounds normal; no masses,  no organomegaly Extremities: extremities normal, atraumatic, no cyanosis or edema  Lab Results:  Results for orders placed or performed during the hospital encounter of 09/16/14 (from the past 48 hour(s))  CBC with Differential     Status: Abnormal   Collection Time: 09/16/14 11:10 AM  Result Value Ref Range   WBC 12.2 (H) 4.0 - 10.5 K/uL   RBC 4.81 4.22 - 5.81 MIL/uL   Hemoglobin 15.5 13.0 - 17.0 g/dL   HCT 48.0 39.0 - 52.0 %   MCV 99.8 78.0 - 100.0 fL   MCH 32.2 26.0 - 34.0 pg   MCHC 32.3 30.0 - 36.0 g/dL   RDW 13.9 11.5 - 15.5 %   Platelets 229 150 - 400 K/uL   Neutrophils Relative % 63 43 - 77 %   Neutro Abs 7.6 1.7 - 7.7 K/uL   Lymphocytes Relative 14 12 - 46 %   Lymphs Abs 1.7 0.7 - 4.0 K/uL   Monocytes Relative 7 3 - 12 %   Monocytes Absolute 0.9 0.1 - 1.0 K/uL   Eosinophils Relative 15 (H) 0 - 5 %   Eosinophils Absolute 1.8 (H) 0.0 - 0.7 K/uL   Basophils Relative 1 0 - 1 %   Basophils Absolute 0.1 0.0 - 0.1 K/uL  Basic metabolic panel     Status: Abnormal   Collection Time: 09/16/14 11:10 AM  Result Value Ref Range   Sodium 139 135  - 145 mmol/L   Potassium 5.1 3.5 - 5.1 mmol/L   Chloride 105 101 - 111 mmol/L   CO2 29 22 - 32 mmol/L   Glucose, Bld 121 (H) 65 - 99 mg/dL   BUN 17 6 - 20 mg/dL   Creatinine, Ser 1.43 (H) 0.61 - 1.24 mg/dL   Calcium 8.8 (L) 8.9 - 10.3 mg/dL   GFR calc non Af Amer 49 (L) >60 mL/min   GFR calc Af Amer 57 (L) >60 mL/min    Comment: (NOTE) The eGFR has been calculated using the CKD EPI equation. This calculation has not been validated in all clinical situations. eGFR's persistently <60 mL/min signify possible Chronic Kidney Disease.    Anion gap 5 5 - 15  Troponin I     Status: None   Collection Time: 09/16/14 11:10 AM  Result Value Ref Range   Troponin I <0.03 <0.031 ng/mL    Comment:        NO INDICATION OF MYOCARDIAL INJURY.   Glucose, capillary     Status: Abnormal   Collection Time: 09/16/14  4:01 PM  Result Value Ref Range   Glucose-Capillary 228 (H) 65 - 99 mg/dL  Comment 1 Notify RN   Glucose, capillary     Status: Abnormal   Collection Time: 09/16/14  9:56 PM  Result Value Ref Range   Glucose-Capillary 123 (H) 65 - 99 mg/dL   Comment 1 Notify RN    Comment 2 Document in Chart   TSH     Status: None   Collection Time: 09/17/14  6:42 AM  Result Value Ref Range   TSH 0.745 0.350 - 4.500 uIU/mL  Hemoglobin A1c     Status: Abnormal   Collection Time: 09/17/14  6:42 AM  Result Value Ref Range   Hgb A1c MFr Bld 6.3 (H) 4.8 - 5.6 %    Comment: (NOTE)         Pre-diabetes: 5.7 - 6.4         Diabetes: >6.4         Glycemic control for adults with diabetes: <7.0    Mean Plasma Glucose 134 mg/dL    Comment: (NOTE) Performed At: [facility] [place], [place] [id number] Lindon Romp MD BP:[id number]   CBC     Status: Abnormal   Collection Time: 09/17/14  6:42 AM  Result Value Ref Range   WBC 13.7 (H) 4.0 - 10.5 K/uL   RBC 4.55 4.22 - 5.81 MIL/uL   Hemoglobin 14.8 13.0 - 17.0 g/dL   HCT 45.8 39.0 - 52.0 %   MCV 100.7 (H) 78.0 - 100.0 fL    MCH 32.5 26.0 - 34.0 pg   MCHC 32.3 30.0 - 36.0 g/dL   RDW 13.8 11.5 - 15.5 %   Platelets 250 150 - 400 K/uL  Comprehensive metabolic panel     Status: Abnormal   Collection Time: 09/17/14  6:42 AM  Result Value Ref Range   Sodium 138 135 - 145 mmol/L   Potassium 5.5 (H) 3.5 - 5.1 mmol/L   Chloride 106 101 - 111 mmol/L   CO2 28 22 - 32 mmol/L   Glucose, Bld 146 (H) 65 - 99 mg/dL   BUN 18 6 - 20 mg/dL   Creatinine, Ser 1.24 0.61 - 1.24 mg/dL   Calcium 8.6 (L) 8.9 - 10.3 mg/dL   Total Protein 6.1 (L) 6.5 - 8.1 g/dL   Albumin 3.5 3.5 - 5.0 g/dL   AST 14 (L) 15 - 41 U/L   ALT 10 (L) 17 - 63 U/L   Alkaline Phosphatase 46 38 - 126 U/L   Total Bilirubin 0.6 0.3 - 1.2 mg/dL   GFR calc non Af Amer 58 (L) >60 mL/min   GFR calc Af Amer >60 >60 mL/min    Comment: (NOTE) The eGFR has been calculated using the CKD EPI equation. This calculation has not been validated in all clinical situations. eGFR's persistently <60 mL/min signify possible Chronic Kidney Disease.    Anion gap 4 (L) 5 - 15  Glucose, capillary     Status: Abnormal   Collection Time: 09/17/14  7:35 AM  Result Value Ref Range   Glucose-Capillary 136 (H) 65 - 99 mg/dL  Glucose, capillary     Status: None   Collection Time: 09/17/14 11:23 AM  Result Value Ref Range   Glucose-Capillary 99 65 - 99 mg/dL  Glucose, capillary     Status: Abnormal   Collection Time: 09/17/14  4:41 PM  Result Value Ref Range   Glucose-Capillary 139 (H) 65 - 99 mg/dL   Comment 1 Notify RN   Glucose, capillary     Status: Abnormal  Collection Time: 09/17/14 11:00 PM  Result Value Ref Range   Glucose-Capillary 126 (H) 65 - 99 mg/dL  Basic metabolic panel     Status: Abnormal   Collection Time: 09/18/14  5:44 AM  Result Value Ref Range   Sodium 138 135 - 145 mmol/L   Potassium 5.0 3.5 - 5.1 mmol/L   Chloride 104 101 - 111 mmol/L   CO2 27 22 - 32 mmol/L   Glucose, Bld 126 (H) 65 - 99 mg/dL   BUN 28 (H) 6 - 20 mg/dL   Creatinine, Ser  1.41 (H) 0.61 - 1.24 mg/dL   Calcium 8.8 (L) 8.9 - 10.3 mg/dL   GFR calc non Af Amer 50 (L) >60 mL/min   GFR calc Af Amer 58 (L) >60 mL/min    Comment: (NOTE) The eGFR has been calculated using the CKD EPI equation. This calculation has not been validated in all clinical situations. eGFR's persistently <60 mL/min signify possible Chronic Kidney Disease.    Anion gap 7 5 - 15  CBC     Status: Abnormal   Collection Time: 09/18/14  5:44 AM  Result Value Ref Range   WBC 17.9 (H) 4.0 - 10.5 K/uL   RBC 4.41 4.22 - 5.81 MIL/uL   Hemoglobin 14.4 13.0 - 17.0 g/dL   HCT 44.7 39.0 - 52.0 %   MCV 101.4 (H) 78.0 - 100.0 fL   MCH 32.7 26.0 - 34.0 pg   MCHC 32.2 30.0 - 36.0 g/dL   RDW 13.9 11.5 - 15.5 %   Platelets 276 150 - 400 K/uL  Glucose, capillary     Status: Abnormal   Collection Time: 09/18/14  7:29 AM  Result Value Ref Range   Glucose-Capillary 108 (H) 65 - 99 mg/dL   Comment 1 Notify RN     ABGS No results for input(s): PHART, PO2ART, TCO2, HCO3 in the last 72 hours.  Invalid input(s): PCO2 CULTURES No results found for this or any previous visit (from the past 240 hour(s)). Studies/Results: Dg Chest 2 View  09/16/2014   CLINICAL DATA:  Shortness of breath for a few days. Productive cough for 2 weeks. History of COPD.  EXAM: CHEST  2 VIEW  COMPARISON:  Single view of the chest 07/17/2014. CT chest and single view of the chest 05/25/2005.  FINDINGS: The lungs are emphysematous but clear. Heart size is normal. No pneumothorax or pleural effusion. No focal bony abnormality.  IMPRESSION: Emphysema without acute disease.   Electronically Signed   By: Inge Rise M.D.   On: 09/16/2014 11:32   Ct Angio Chest Pe W/cm &/or Wo Cm  09/17/2014   CLINICAL DATA:  Sudden onset of shortness of Breath  EXAM: CT ANGIOGRAPHY CHEST WITH CONTRAST  TECHNIQUE: Multidetector CT imaging of the chest was performed using the standard protocol during bolus administration of intravenous contrast.  Multiplanar CT image reconstructions and MIPs were obtained to evaluate the vascular anatomy.  CONTRAST:  184m OMNIPAQUE IOHEXOL 350 MG/ML SOLN  COMPARISON:  None.  FINDINGS: Lungs are well aerated bilaterally. Mild emphysematous changes are noted. No focal infiltrate or sizable parenchymal nodule is seen.  The thoracic inlet is within normal limits. The thoracic aorta and its branches demonstrate mild atherosclerotic calcification without aneurysm or dissection. No hilar or mediastinal adenopathy is seen. Pulmonary artery is well visualized and demonstrates a normal branching pattern. Heavy coronary calcifications are seen. No acute bony abnormality is noted. The visualized upper abdomen is unremarkable.  Review of the MIP  images confirms the above findings.  IMPRESSION: No evidence of pulmonary emboli.  Aortic atherosclerotic disease.  Mild emphysematous changes.   Electronically Signed   By: Inez Catalina M.D.   On: 09/17/2014 14:17    Medications:  Prior to Admission:  Prescriptions prior to admission  Medication Sig Dispense Refill Last Dose  . albuterol (PROVENTIL HFA;VENTOLIN HFA) 108 (90 BASE) MCG/ACT inhaler Inhale 2 puffs into the lungs every 4 (four) hours as needed for wheezing or shortness of breath. 1 Inhaler 3 09/16/2014 at 1000  . aspirin 325 MG tablet Take 325 mg by mouth at bedtime.    09/15/2014 at Unknown time  . budesonide (PULMICORT) 0.5 MG/2ML nebulizer solution Take 2 mLs (0.5 mg total) by nebulization 2 (two) times daily. 120 mL 12 09/16/2014 at 0900  . lisinopril (PRINIVIL,ZESTRIL) 2.5 MG tablet Take 1 tablet (2.5 mg total) by mouth daily. (Patient taking differently: Take 2.5 mg by mouth at bedtime. ) 90 tablet 2 09/15/2014 at Unknown time  . [DISCONTINUED] naproxen sodium (ALEVE) 220 MG tablet Take 220 mg by mouth 2 (two) times daily as needed (pain).   09/15/2014 at Unknown time  . azithromycin (ZITHROMAX Z-PAK) 250 MG tablet Take 2 tablets (500 mg) on  Day 1,  followed by 1  tablet (250 mg) once daily on Days 2 through 5. (Patient not taking: Reported on 09/16/2014) 6 each 0 Completed Course at Unknown time  . benzonatate (TESSALON) 100 MG capsule Take 1 capsule (100 mg total) by mouth 3 (three) times daily as needed for cough. (Patient not taking: Reported on 09/16/2014) 30 capsule 2 Completed Course at Unknown time  . doxycycline (VIBRAMYCIN) 100 MG capsule Take 1 capsule (100 mg total) by mouth 2 (two) times daily. (Patient not taking: Reported on 09/16/2014) 20 capsule 0 Completed Course at Unknown time  . predniSONE (DELTASONE) 20 MG tablet 3 po qd x 3 d then 2 po qd x 3 d then 1 po qd x 3 d (Patient not taking: Reported on 09/16/2014) 18 tablet 0 Completed Course at Unknown time   Scheduled: . aspirin  325 mg Oral QHS  . azithromycin  500 mg Intravenous Q24H  . budesonide-formoterol  2 puff Inhalation BID  . cefTRIAXone (ROCEPHIN)  IV  1 g Intravenous Q24H  . chlorpheniramine-HYDROcodone  5 mL Oral Q12H  . enoxaparin (LOVENOX) injection  40 mg Subcutaneous Q24H  . famotidine  20 mg Oral Daily  . guaiFENesin  600 mg Oral BID  . insulin aspart  0-15 Units Subcutaneous TID WC  . insulin aspart  0-5 Units Subcutaneous QHS  . insulin glargine  10 Units Subcutaneous QHS  . levalbuterol  0.63 mg Nebulization Q4H WA  . methylPREDNISolone (SOLU-MEDROL) injection  80 mg Intravenous 3 times per day  . tiotropium  18 mcg Inhalation Daily   Continuous: . sodium chloride 50 mL/hr at 09/17/14 1525   IPJ:[id number] **OR** acetaminophen, alum & mag hydroxide-simeth, benzonatate, ondansetron **OR** ondansetron (ZOFRAN) IV  Assesment: He is admitted with COPD exacerbation and hypoxic respiratory failure. He is improved. He feels like his current medications are working well. CT did not show anything other than his COPD and his echocardiogram showed left ventricular hypertrophy and indeterminate diastolic function Principal Problem:   COPD with acute exacerbation Active  Problems:   Essential hypertension, benign   Tobacco abuse   Obesity   AKI (acute kidney injury)   CKD (chronic kidney disease), stage II   Hyperglycemia, drug-induced   Acute respiratory failure  with hypoxia    Plan: Continue treatments. I think he is improved. He will need outpatient pulmonary function test and he already has an appointment scheduled to see me in my office next month    LOS: 2 days   Lakeena Downie L 09/18/2014, 8:52 AM

## 2014-09-18 NOTE — Progress Notes (Signed)
[facility] PROGRESS NOTE  Blake Cherry ZOX:[id number] DOB: 1945-12-13 DOA: 09/16/2014 PCP: Lilyan Punt, MD    Code Status: Full code Family Communication: Discussed with his wife on 7/24 Disposition Plan: Discharge when clinically appropriate, likely in the next few days.   Consultants:  Pulmonology, Dr. Juanetta Gosling  Procedures:  2-D echocardiogram 7/25:- Left ventricle: The cavity size was normal. Wall thickness was increased in a pattern of mild LVH. Systolic function was normal. The estimated ejection fraction was in the range of 55% to 60%. Indeterminant diastolic dysfunction. Wall motion was normal; there were no regional wall motion abnormalities. - Aortic valve: Mildly calcified annulus. Mildly thickened leaflets. Valve area (VTI): 3.31 cm^2. Valve area (Vmax): 3.16 cm^2. - Mitral valve: Mildly calcified annulus. Mildly thickened leaflets - Atrial septum: There was increased thickness of the septum, consistent with lipomatous hypertrophy. No defect or patent foramen ovale was identified. - Technically difficult study.  Antibiotics:  Rocephin 7/24>>  Azithromycin 7/24>>  HPI/Subjective: Patient feels less short of breath with ambulation, but still gets pretty winded with short activity. He feels that the congestion is breaking up. He denies chest pain.  Objective: Filed Vitals:   09/18/14 0524  BP: 130/68  Pulse: 95  Temp: 97.7 F (36.5 C)  Resp: 20   oxygen saturation 98% on supplement oxygen.  Intake/Output Summary (Last 24 hours) at 09/18/14 1315 Last data filed at 09/18/14 0900  Gross per 24 hour  Intake 1459.17 ml  Output   1750 ml  Net -290.83 ml   Filed Weights   09/16/14 1543 09/17/14 0500 09/18/14 0524  Weight: 92.307 kg (203 lb 8 oz) 92.715 kg (204 lb 6.4 oz) 93.577 kg (206 lb 4.8 oz)    Exam:   General:  Pleasant alert obese 69 year old man in no acute distress. He was standing at the sink washing his hands  without shortness of breath while standing.  Cardiovascular: S1, S2, with a soft systolic murmur.  Respiratory: Scattered crackly wheezes;  better air movement; breathing mildly labored with speaking, but nonlabored at rest.  Abdomen: Obese, positive bowel sounds, soft, nontender, nondistended.  Musculoskeletal/extremities:: No pedal edema. No acute hot red joints.  Neurologic: He is alert and oriented 3.  Data Reviewed: Basic Metabolic Panel:  Recent Labs Lab 09/16/14 1110 09/17/14 0642 09/18/14 0544  NA 139 138 138  K 5.1 5.5* 5.0  CL 105 106 104  CO2 29 28 27   GLUCOSE 121* 146* 126*  BUN 17 18 28*  CREATININE 1.43* 1.24 1.41*  CALCIUM 8.8* 8.6* 8.8*   Liver Function Tests:  Recent Labs Lab 09/17/14 0642  AST 14*  ALT 10*  ALKPHOS 46  BILITOT 0.6  PROT 6.1*  ALBUMIN 3.5   No results for input(s): LIPASE, AMYLASE in the last 168 hours. No results for input(s): AMMONIA in the last 168 hours. CBC:  Recent Labs Lab 09/16/14 1110 09/17/14 0642 09/18/14 0544  WBC 12.2* 13.7* 17.9*  NEUTROABS 7.6  --   --   HGB 15.5 14.8 14.4  HCT 48.0 45.8 44.7  MCV 99.8 100.7* 101.4*  PLT 229 250 276   Cardiac Enzymes:  Recent Labs Lab 09/16/14 1110  TROPONINI <0.03   BNP (last 3 results) No results for input(s): BNP in the last 8760 hours.  ProBNP (last 3 results) No results for input(s): PROBNP in the last 8760 hours.  CBG:  Recent Labs Lab 09/17/14 1123 09/17/14 1641 09/17/14 2300 09/18/14 0729 09/18/14 1131  GLUCAP 99 139* 126* 108* 110*  No results found for this or any previous visit (from the past 240 hour(s)).   Studies: Ct Angio Chest Pe W/cm &/or Wo Cm  09/17/2014   CLINICAL DATA:  Sudden onset of shortness of Breath  EXAM: CT ANGIOGRAPHY CHEST WITH CONTRAST  TECHNIQUE: Multidetector CT imaging of the chest was performed using the standard protocol during bolus administration of intravenous contrast. Multiplanar CT image reconstructions  and MIPs were obtained to evaluate the vascular anatomy.  CONTRAST:  OMNIPAQUE IOHEXOL 350 MG/ML SOLN  COMPARISON:  None.  FINDINGS: Lungs are well aerated bilaterally. Mild emphysematous changes are noted. No focal infiltrate or sizable parenchymal nodule is seen.  The thoracic inlet is within normal limits. The thoracic aorta and its branches demonstrate mild atherosclerotic calcification without aneurysm or dissection. No hilar or mediastinal adenopathy is seen. Pulmonary artery is well visualized and demonstrates a normal branching pattern. Heavy coronary calcifications are seen. No acute bony abnormality is noted. The visualized upper abdomen is unremarkable.  Review of the MIP images confirms the above findings.  IMPRESSION: No evidence of pulmonary emboli.  Aortic atherosclerotic disease.  Mild emphysematous changes.   Electronically Signed   By: Alcide Clever M.D.   On: 09/17/2014 14:17    Scheduled Meds: . aspirin  325 mg Oral QHS  . azithromycin  500 mg Intravenous Q24H  . budesonide-formoterol  2 puff Inhalation BID  . cefTRIAXone (ROCEPHIN)  IV  1 g Intravenous Q24H  . chlorpheniramine-HYDROcodone  5 mL Oral Q12H  . enoxaparin (LOVENOX) injection  40 mg Subcutaneous Q24H  . famotidine  20 mg Oral Daily  . guaiFENesin  600 mg Oral BID  . insulin aspart  0-15 Units Subcutaneous TID WC  . insulin aspart  0-5 Units Subcutaneous QHS  . insulin glargine  10 Units Subcutaneous QHS  . levalbuterol  0.63 mg Nebulization Q4H WA  . methylPREDNISolone (SOLU-MEDROL) injection  80 mg Intravenous 3 times per day  . tiotropium  18 mcg Inhalation Daily   Continuous Infusions: . sodium chloride 50 mL/hr at 09/18/14 1225   Assessment and plan:  Principal Problem:   COPD with acute exacerbation Active Problems:   Acute respiratory failure with hypoxia   Tobacco abuse   AKI (acute kidney injury)   Essential hypertension, benign   Obesity   CKD (chronic kidney disease), stage II    Hyperglycemia, drug-induced    Acute respiratory failure with hypoxia secondary to COPD with exacerbation.  Per patient's history, he had been treated several times for exacerbations over the past 2 months with antibiotics and steroids. He was subsequently referred to Dr. Juanetta Gosling by his PCP, but the appointment was not until August.  Patient was mildly hypoxic in the ED. His chest x-ray revealed no infiltrate or edema, but with emphysematous changes.  -For treatment, he was given 125 mg Solu-Medrol by EMS, a continuous albuterol nebulizer in the ED, and started on azithromycin and Rocephin.  -We'll continue Rocephin/azithromycin, Xopenex, Spiriva, Solu-Medrol, and Tussionex. -Dr. Juanetta Gosling evaluated the patient and discontinued Pulmicort in favor of Symbicort. He ordered a 2-D echocardiogram which revealed no significant valvular abnormalities and an EF of 55-60%. -Patient is still symptomatic, but his breathing and pulmonary exam are improving. -Consider weaning Solu-Medrol tomorrow. Maryclare Labrador order nursing to check oxygen saturations on room air today and tomorrow. -Anticipate another couple days in the hospital. Tobacco abuse. The patient was commended on decreasing the number of cigarettes he smokes, but he was encouraged to stop altogether. Acute kidney injury,  -  The patient's creatinine is 1.43 on admission. 2 months ago, it ranged from 1.26-1.37. He is treated with lisinopril chronically.  -He likely has stage II was stage III chronic kidney disease.  -His creatinine improved with gentle IV fluid hydration, but increased compared to yesterday.  -We'll continue to monitor. Hyperkalemia. The patient's serum potassium increased to 5.5 on 7/25. Potassium chloride was discontinued and IV fluids. Lisinopril is being held half-normal saline was started for treatment. -His serum potassium is within normal limits, but on the higher end of normal. -We'll continue current management. Will hold  lisinopril. Will hold lisinopril until his blood pressure becomes hypertensive. Essential hypertension. Patient's blood pressure is within normal limits. He is treated with lisinopril chronically. It is being held with a serum potassium of 5.0. It can be restarted when his serum potassium decreases more. History of AAA repair.  The patient has a follow-up appointment with his surgeon Dr. Darrick Penna later on in the week. -Echocardiogram results noted for no evidence of aneurysm. Sinus tachycardia . Patient's heart rate was in the 120s on admission, which was attributed to respiratory distress and continuous beta agonist nebulizer with albuterol for 1 hour. His TSH was within normal limits. His heart rate has normalized.    Hyperglycemia.  The patient's venous glucose was 121 on admission. He denied history of diabetes. The elevation is likely steroid-induced. Will continue sliding scale NovoLog and Lantus. His hemoglobin A1c was 6.3, which indicates prediabetes. Will change his diet to carbohydrate modified.     Leukocytosis. Patient's white blood cell count was 12.2 on admission. It has increased, likely from steroids. We'll continue to monitor.     Time spent: 30 minutes    [facility]  [facility] Pager [phone]. If 7PM-7AM, please contact night-coverage at [email or web address], password [facility] 09/18/2014, 1:15 PM  LOS: 2 days

## 2014-09-19 DIAGNOSIS — N179 Acute kidney failure, unspecified: Secondary | ICD-10-CM

## 2014-09-19 DIAGNOSIS — J9601 Acute respiratory failure with hypoxia: Secondary | ICD-10-CM

## 2014-09-19 DIAGNOSIS — J441 Chronic obstructive pulmonary disease with (acute) exacerbation: Principal | ICD-10-CM

## 2014-09-19 DIAGNOSIS — I10 Essential (primary) hypertension: Secondary | ICD-10-CM

## 2014-09-19 LAB — CBC
HCT: 43.7 % (ref 39.0–52.0)
Hemoglobin: 14.2 g/dL (ref 13.0–17.0)
MCH: 32.3 pg (ref 26.0–34.0)
MCHC: 32.5 g/dL (ref 30.0–36.0)
MCV: 99.3 fL (ref 78.0–100.0)
Platelets: 227 10*3/uL (ref 150–400)
RBC: 4.4 MIL/uL (ref 4.22–5.81)
RDW: 13.7 % (ref 11.5–15.5)
WBC: 12.9 10*3/uL — ABNORMAL HIGH (ref 4.0–10.5)

## 2014-09-19 LAB — BASIC METABOLIC PANEL
Anion gap: 4 — ABNORMAL LOW (ref 5–15)
BUN: 29 mg/dL — ABNORMAL HIGH (ref 6–20)
CO2: 32 mmol/L (ref 22–32)
Calcium: 8.8 mg/dL — ABNORMAL LOW (ref 8.9–10.3)
Chloride: 103 mmol/L (ref 101–111)
Creatinine, Ser: 1.37 mg/dL — ABNORMAL HIGH (ref 0.61–1.24)
GFR calc Af Amer: 60 mL/min — ABNORMAL LOW (ref >60)
GFR calc non Af Amer: 51 mL/min — ABNORMAL LOW (ref >60)
Glucose, Bld: 143 mg/dL — ABNORMAL HIGH (ref 65–99)
Potassium: 5.4 mmol/L — ABNORMAL HIGH (ref 3.5–5.1)
Sodium: 139 mmol/L (ref 135–145)

## 2014-09-19 LAB — GLUCOSE, CAPILLARY
Glucose-Capillary: 109 mg/dL — ABNORMAL HIGH (ref 65–99)
Glucose-Capillary: 112 mg/dL — ABNORMAL HIGH (ref 65–99)

## 2014-09-19 MED ORDER — BENZONATATE 100 MG PO CAPS: 100.0000 mg | ORAL_CAPSULE | Freq: Three times a day (TID) | ORAL | Status: DC | PRN

## 2014-09-19 MED ORDER — TIOTROPIUM BROMIDE MONOHYDRATE 18 MCG IN CAPS: 18.0000 ug | ORAL_CAPSULE | Freq: Every day | RESPIRATORY_TRACT | Status: DC

## 2014-09-19 MED ORDER — AZITHROMYCIN 250 MG PO TABS
500.0000 mg | ORAL_TABLET | Freq: Every day | ORAL | Status: DC
  Administered 2014-09-19: 500 mg via ORAL
  Filled 2014-09-19: qty 2

## 2014-09-19 MED ORDER — AZITHROMYCIN 250 MG PO TABS: ORAL_TABLET | ORAL | Status: DC

## 2014-09-19 MED ORDER — GUAIFENESIN ER 600 MG PO TB12: 600.0000 mg | ORAL_TABLET | Freq: Two times a day (BID) | ORAL | Status: AC

## 2014-09-19 MED ORDER — BUDESONIDE-FORMOTEROL FUMARATE 160-4.5 MCG/ACT IN AERO: 2.0000 | INHALATION_SPRAY | Freq: Two times a day (BID) | RESPIRATORY_TRACT | Status: DC

## 2014-09-19 MED ORDER — PREDNISONE 10 MG PO TABS
ORAL_TABLET | ORAL | Status: DC
Start: 2014-09-19 — End: 2014-11-07

## 2014-09-19 NOTE — Care Management Important Message (Signed)
Important Message  Patient Details  Name: Blake Cherry MRN: [id number] Date of Birth: April 15, 1945   Medicare Important Message Given:  Yes-second notification given    Cheryl Flash, RN 09/19/2014, 2:21 PM

## 2014-09-19 NOTE — Care Management Note (Signed)
Case Management Note  Patient Details  Name: Blake Cherry MRN: [id number] Date of Birth: [birth year]  Subjective/Objective:                    Action/Plan:   Expected Discharge Date:  09/19/14               Expected Discharge Plan:  Home/Self Care  In-House Referral:  NA  Discharge planning Services  CM Consult  Post Acute Care Choice:  NA Choice offered to:  NA  DME Arranged:    DME Agency:     HH Arranged:    HH Agency:     Status of Service:  Completed, signed off  Medicare Important Message Given:  Yes-second notification given Date Medicare IM Given:    Medicare IM give by:    Date Additional Medicare IM Given:    Additional Medicare Important Message give by:     If discussed at Long Length of Stay Meetings, dates discussed:    Additional Comments: Pt discharged home today. No CM needs noted. Pt has a neb machine at home. Arlyss Queen Ravenden, RN 09/19/2014, 2:22 PM

## 2014-09-19 NOTE — Progress Notes (Signed)
Subjective: He was admitted with COPD exacerbation and he is improving. He says he feels back at baseline  Objective: Vital signs in last 24 hours: Temp:  [97.9 F (36.6 C)-98 F (36.7 C)] 98 F (36.7 C) (07/27 0602) Pulse Rate:  [72-101] 72 (07/27 0602) Resp:  [20] 20 (07/27 0602) BP: (126-141)/(61-78) 126/61 mmHg (07/27 0602) SpO2:  [92 %-98 %] 95 % (07/27 0713) Weight:  [93.622 kg (206 lb 6.4 oz)] 93.622 kg (206 lb 6.4 oz) (07/27 0602) Weight change: 0.045 kg (1.6 oz) Last BM Date: 09/18/14  Intake/Output from previous day: 07/26 0701 - 07/27 0700 In: 1510.8 [P.O.:720; I.V.:440.8; IV Piggyback:350] Out: -   PHYSICAL EXAM General appearance: alert, cooperative and no distress Resp: clear to auscultation bilaterally Cardio: regular rate and rhythm, S1, S2 normal, no murmur, click, rub or gallop GI: soft, non-tender; bowel sounds normal; no masses,  no organomegaly Extremities: extremities normal, atraumatic, no cyanosis or edema  Lab Results:  Results for orders placed or performed during the hospital encounter of 09/16/14 (from the past 48 hour(s))  Glucose, capillary     Status: None   Collection Time: 09/17/14 11:23 AM  Result Value Ref Range   Glucose-Capillary 99 65 - 99 mg/dL  Glucose, capillary     Status: Abnormal   Collection Time: 09/17/14  4:41 PM  Result Value Ref Range   Glucose-Capillary 139 (H) 65 - 99 mg/dL   Comment 1 Notify RN   Glucose, capillary     Status: Abnormal   Collection Time: 09/17/14 11:00 PM  Result Value Ref Range   Glucose-Capillary 126 (H) 65 - 99 mg/dL  Basic metabolic panel     Status: Abnormal   Collection Time: 09/18/14  5:44 AM  Result Value Ref Range   Sodium 138 135 - 145 mmol/L   Potassium 5.0 3.5 - 5.1 mmol/L   Chloride 104 101 - 111 mmol/L   CO2 27 22 - 32 mmol/L   Glucose, Bld 126 (H) 65 - 99 mg/dL   BUN 28 (H) 6 - 20 mg/dL   Creatinine, Ser 1.41 (H) 0.61 - 1.24 mg/dL   Calcium 8.8 (L) 8.9 - 10.3 mg/dL   GFR calc  non Af Amer 50 (L) >60 mL/min   GFR calc Af Amer 58 (L) >60 mL/min    Comment: (NOTE) The eGFR has been calculated using the CKD EPI equation. This calculation has not been validated in all clinical situations. eGFR's persistently <60 mL/min signify possible Chronic Kidney Disease.    Anion gap 7 5 - 15  CBC     Status: Abnormal   Collection Time: 09/18/14  5:44 AM  Result Value Ref Range   WBC 17.9 (H) 4.0 - 10.5 K/uL   RBC 4.41 4.22 - 5.81 MIL/uL   Hemoglobin 14.4 13.0 - 17.0 g/dL   HCT 44.7 39.0 - 52.0 %   MCV 101.4 (H) 78.0 - 100.0 fL   MCH 32.7 26.0 - 34.0 pg   MCHC 32.2 30.0 - 36.0 g/dL   RDW 13.9 11.5 - 15.5 %   Platelets 276 150 - 400 K/uL  Glucose, capillary     Status: Abnormal   Collection Time: 09/18/14  7:29 AM  Result Value Ref Range   Glucose-Capillary 108 (H) 65 - 99 mg/dL   Comment 1 Notify RN   Glucose, capillary     Status: Abnormal   Collection Time: 09/18/14 11:31 AM  Result Value Ref Range   Glucose-Capillary 110 (H) 65 - 99  mg/dL   Comment 1 Notify RN   Glucose, capillary     Status: Abnormal   Collection Time: 09/18/14  5:00 PM  Result Value Ref Range   Glucose-Capillary 128 (H) 65 - 99 mg/dL   Comment 1 Notify RN   Glucose, capillary     Status: Abnormal   Collection Time: 09/18/14  8:54 PM  Result Value Ref Range   Glucose-Capillary 135 (H) 65 - 99 mg/dL   Comment 1 Notify RN    Comment 2 Document in Chart   CBC     Status: Abnormal   Collection Time: 09/19/14  6:35 AM  Result Value Ref Range   WBC 12.9 (H) 4.0 - 10.5 K/uL   RBC 4.40 4.22 - 5.81 MIL/uL   Hemoglobin 14.2 13.0 - 17.0 g/dL   HCT 43.7 39.0 - 52.0 %   MCV 99.3 78.0 - 100.0 fL   MCH 32.3 26.0 - 34.0 pg   MCHC 32.5 30.0 - 36.0 g/dL   RDW 13.7 11.5 - 15.5 %   Platelets 227 150 - 400 K/uL  Basic metabolic panel     Status: Abnormal   Collection Time: 09/19/14  6:35 AM  Result Value Ref Range   Sodium 139 135 - 145 mmol/L   Potassium 5.4 (H) 3.5 - 5.1 mmol/L   Chloride 103  101 - 111 mmol/L   CO2 32 22 - 32 mmol/L   Glucose, Bld 143 (H) 65 - 99 mg/dL   BUN 29 (H) 6 - 20 mg/dL   Creatinine, Ser 1.37 (H) 0.61 - 1.24 mg/dL   Calcium 8.8 (L) 8.9 - 10.3 mg/dL   GFR calc non Af Amer 51 (L) >60 mL/min   GFR calc Af Amer 60 (L) >60 mL/min    Comment: (NOTE) The eGFR has been calculated using the CKD EPI equation. This calculation has not been validated in all clinical situations. eGFR's persistently <60 mL/min signify possible Chronic Kidney Disease.    Anion gap 4 (L) 5 - 15  Glucose, capillary     Status: Abnormal   Collection Time: 09/19/14  7:30 AM  Result Value Ref Range   Glucose-Capillary 109 (H) 65 - 99 mg/dL    ABGS No results for input(s): PHART, PO2ART, TCO2, HCO3 in the last 72 hours.  Invalid input(s): PCO2 CULTURES No results found for this or any previous visit (from the past 240 hour(s)). Studies/Results: Ct Angio Chest Pe W/cm &/or Wo Cm  09/17/2014   CLINICAL DATA:  Sudden onset of shortness of Breath  EXAM: CT ANGIOGRAPHY CHEST WITH CONTRAST  TECHNIQUE: Multidetector CT imaging of the chest was performed using the standard protocol during bolus administration of intravenous contrast. Multiplanar CT image reconstructions and MIPs were obtained to evaluate the vascular anatomy.  CONTRAST:  187m OMNIPAQUE IOHEXOL 350 MG/ML SOLN  COMPARISON:  None.  FINDINGS: Lungs are well aerated bilaterally. Mild emphysematous changes are noted. No focal infiltrate or sizable parenchymal nodule is seen.  The thoracic inlet is within normal limits. The thoracic aorta and its branches demonstrate mild atherosclerotic calcification without aneurysm or dissection. No hilar or mediastinal adenopathy is seen. Pulmonary artery is well visualized and demonstrates a normal branching pattern. Heavy coronary calcifications are seen. No acute bony abnormality is noted. The visualized upper abdomen is unremarkable.  Review of the MIP images confirms the above findings.   IMPRESSION: No evidence of pulmonary emboli.  Aortic atherosclerotic disease.  Mild emphysematous changes.   Electronically Signed   By: MElta Guadeloupe  Lukens M.D.   On: 09/17/2014 14:17    Medications:  Prior to Admission:  Prescriptions prior to admission  Medication Sig Dispense Refill Last Dose  . albuterol (PROVENTIL HFA;VENTOLIN HFA) 108 (90 BASE) MCG/ACT inhaler Inhale 2 puffs into the lungs every 4 (four) hours as needed for wheezing or shortness of breath. 1 Inhaler 3 09/16/2014 at 1000  . aspirin 325 MG tablet Take 325 mg by mouth at bedtime.    09/15/2014 at Unknown time  . budesonide (PULMICORT) 0.5 MG/2ML nebulizer solution Take 2 mLs (0.5 mg total) by nebulization 2 (two) times daily. 120 mL 12 09/16/2014 at 0900  . lisinopril (PRINIVIL,ZESTRIL) 2.5 MG tablet Take 1 tablet (2.5 mg total) by mouth daily. (Patient taking differently: Take 2.5 mg by mouth at bedtime. ) 90 tablet 2 09/15/2014 at Unknown time  . [DISCONTINUED] naproxen sodium (ALEVE) 220 MG tablet Take 220 mg by mouth 2 (two) times daily as needed (pain).   09/15/2014 at Unknown time  . azithromycin (ZITHROMAX Z-PAK) 250 MG tablet Take 2 tablets (500 mg) on  Day 1,  followed by 1 tablet (250 mg) once daily on Days 2 through 5. (Patient not taking: Reported on 09/16/2014) 6 each 0 Completed Course at Unknown time  . benzonatate (TESSALON) 100 MG capsule Take 1 capsule (100 mg total) by mouth 3 (three) times daily as needed for cough. (Patient not taking: Reported on 09/16/2014) 30 capsule 2 Completed Course at Unknown time  . doxycycline (VIBRAMYCIN) 100 MG capsule Take 1 capsule (100 mg total) by mouth 2 (two) times daily. (Patient not taking: Reported on 09/16/2014) 20 capsule 0 Completed Course at Unknown time  . predniSONE (DELTASONE) 20 MG tablet 3 po qd x 3 d then 2 po qd x 3 d then 1 po qd x 3 d (Patient not taking: Reported on 09/16/2014) 18 tablet 0 Completed Course at Unknown time   Scheduled: . aspirin  325 mg Oral QHS  .  azithromycin  500 mg Intravenous Q24H  . budesonide-formoterol  2 puff Inhalation BID  . cefTRIAXone (ROCEPHIN)  IV  1 g Intravenous Q24H  . chlorpheniramine-HYDROcodone  5 mL Oral Q12H  . enoxaparin (LOVENOX) injection  40 mg Subcutaneous Q24H  . famotidine  20 mg Oral Daily  . guaiFENesin  600 mg Oral BID  . insulin aspart  0-15 Units Subcutaneous TID WC  . insulin aspart  0-5 Units Subcutaneous QHS  . insulin glargine  10 Units Subcutaneous QHS  . levalbuterol  0.63 mg Nebulization Q4H WA  . methylPREDNISolone (SOLU-MEDROL) injection  80 mg Intravenous 3 times per day  . tiotropium  18 mcg Inhalation Daily   Continuous: . sodium chloride 50 mL/hr at 09/18/14 1225   FYB:OFBPZWCHENIDP **OR** acetaminophen, alum & mag hydroxide-simeth, benzonatate, ondansetron **OR** ondansetron (ZOFRAN) IV  Assesment: He was admitted with COPD with acute exacerbation and acute hypoxic respiratory failure. He is much improved. Principal Problem:   COPD with acute exacerbation Active Problems:   Essential hypertension, benign   Tobacco abuse   Obesity   AKI (acute kidney injury)   CKD (chronic kidney disease), stage II   Hyperglycemia, drug-induced   Acute respiratory failure with hypoxia    Plan: From a strictly pulmonary point of view he can be discharged today or tomorrow. If you want to switch him to oral agents before discharge I think that would be okay.    LOS: 3 days   , L 09/19/2014, 9:08 AM

## 2014-09-19 NOTE — Discharge Summary (Signed)
Physician Discharge Summary  Blake Cherry AVW:[id number] DOB: [birth year] DOA: 09/16/2014  PCP: Lilyan Punt, MD  Admit date: 09/16/2014 Discharge date: 09/19/2014  Time spent: 40 minutes  Recommendations for Outpatient Follow-up:  1. Follow up with Dr. Juanetta Gosling in the outpatient setting 2. Follow up with primary care physician in 1-2 weeks  Discharge Diagnoses:  Principal Problem:   COPD with acute exacerbation Active Problems:   Essential hypertension, benign   Tobacco abuse   Obesity   AKI (acute kidney injury)   CKD (chronic kidney disease), stage II   Hyperglycemia, drug-induced   Acute respiratory failure with hypoxia   Discharge Condition: improving  Diet recommendation: low salt  Filed Weights   09/17/14 0500 09/18/14 0524 09/19/14 0602  Weight: 92.715 kg (204 lb 6.4 oz) 93.577 kg (206 lb 4.8 oz) 93.622 kg (206 lb 6.4 oz)    History of present illness:  This patient presents to the hospital with worsening shortness of breath. He has a history of COPD . he was noted to be wheezing, pale and diaphoretic on admission with oxygen saturations at 90% on room air. He was given nebulizer treatments Solu-Medrol and oxygen. In the ER he was noted to be tachycardic and tachypneic. He was found to have a COPD exacerbation and was admitted for further treatment.  Hospital Course:  She was treated with bronchodilators, antibiotic and intravenous steroids. He received supplemental oxygen. He was followed by pulmonology during his hospital stay. With treatment, his breathing has improved and he is no longer wheezing. He is not short of breath and is able to ambulate on room air without becoming hypoxic. Patient does feel ready for discharge home. He'll be placed on a prednisone taper, continue antibiotic. He already has nebulizer treatments at home. Pulmicort was switched to Symbicort. He's also been started on Spiriva. He was noted to be mildly dehydrated and started on IV fluids which  improved his renal insufficiency. He is noted to have significant hyperkalemia on ACE inhibitor will be held on discharge. He was extensively counseled on the importance of tobacco cessation. He is otherwise stable for discharge.  Procedures:    Consultations:  Pulmonology  Discharge Exam: Filed Vitals:   09/19/14 1448  BP: 161/78  Pulse: 105  Temp: 98.1 F (36.7 C)  Resp: 20    General: NAD Cardiovascular: S1, S2 RRR Respiratory: CTA B  Discharge Instructions   Discharge Instructions    Diet - low sodium heart healthy    Complete by:  As directed      Increase activity slowly    Complete by:  As directed           Discharge Medication List as of 09/19/2014  3:53 PM    START taking these medications   Details  budesonide-formoterol (SYMBICORT) 160-4.5 MCG/ACT inhaler Inhale 2 puffs into the lungs 2 (two) times daily., Starting 09/19/2014, Until Discontinued, Normal    guaiFENesin (MUCINEX) 600 MG 12 hr tablet Take 1 tablet (600 mg total) by mouth 2 (two) times daily., Starting 09/19/2014, Until Discontinued, Normal    tiotropium (SPIRIVA) 18 MCG inhalation capsule Place 1 capsule (18 mcg total) into inhaler and inhale daily., Starting 09/19/2014, Until Discontinued, Normal      CONTINUE these medications which have CHANGED   Details  azithromycin (ZITHROMAX Z-PAK) 250 MG tablet Take 2 tablets (500 mg) on  Day 1,  followed by 1 tablet (250 mg) once daily on Days 2 through 5., Normal    benzonatate (TESSALON) 100  MG capsule Take 1 capsule (100 mg total) by mouth 3 (three) times daily as needed for cough., Starting 09/19/2014, Until Discontinued, Normal    predniSONE (DELTASONE) 10 MG tablet 60mg  po daily for 2 days then 40mg  daily for 2 days then 30mg  daily for 2 days then 20mg  daily for 2 days then 10mg  daily for 2 days, Normal      CONTINUE these medications which have NOT CHANGED   Details  albuterol (PROVENTIL HFA;VENTOLIN HFA) 108 (90 BASE) MCG/ACT inhaler  Inhale 2 puffs into the lungs every 4 (four) hours as needed for wheezing or shortness of breath., Starting 07/05/2014, Until Discontinued, Normal    aspirin 325 MG tablet Take 325 mg by mouth at bedtime. , Until Discontinued, Historical Med    albuterol (PROVENTIL) (2.5 MG/3ML) 0.083% nebulizer solution USE ONE VIAL IN NEBULIZER EVERY 4 HOURS AS NEEDED FOR  WHEEZING, Normal      STOP taking these medications     budesonide (PULMICORT) 0.5 MG/2ML nebulizer solution      lisinopril (PRINIVIL,ZESTRIL) 2.5 MG tablet      doxycycline (VIBRAMYCIN) 100 MG capsule        No Known Allergies    The results of significant diagnostics from this hospitalization (including imaging, microbiology, ancillary and laboratory) are listed below for reference.    Significant Diagnostic Studies: Dg Chest 2 View  09/16/2014   CLINICAL DATA:  Shortness of breath for a few days. Productive cough for 2 weeks. History of COPD.  EXAM: CHEST  2 VIEW  COMPARISON:  Single view of the chest 07/17/2014. CT chest and single view of the chest 05/25/2005.  FINDINGS: The lungs are emphysematous but clear. Heart size is normal. No pneumothorax or pleural effusion. No focal bony abnormality.  IMPRESSION: Emphysema without acute disease.   Electronically Signed   By: Drusilla Kanner M.D.   On: 09/16/2014 11:32   Ct Angio Chest Pe W/cm &/or Wo Cm  09/17/2014   CLINICAL DATA:  Sudden onset of shortness of Breath  EXAM: CT ANGIOGRAPHY CHEST WITH CONTRAST  TECHNIQUE: Multidetector CT imaging of the chest was performed using the standard protocol during bolus administration of intravenous contrast. Multiplanar CT image reconstructions and MIPs were obtained to evaluate the vascular anatomy.  CONTRAST:  OMNIPAQUE IOHEXOL 350 MG/ML SOLN  COMPARISON:  None.  FINDINGS: Lungs are well aerated bilaterally. Mild emphysematous changes are noted. No focal infiltrate or sizable parenchymal nodule is seen.  The thoracic inlet is within  normal limits. The thoracic aorta and its branches demonstrate mild atherosclerotic calcification without aneurysm or dissection. No hilar or mediastinal adenopathy is seen. Pulmonary artery is well visualized and demonstrates a normal branching pattern. Heavy coronary calcifications are seen. No acute bony abnormality is noted. The visualized upper abdomen is unremarkable.  Review of the MIP images confirms the above findings.  IMPRESSION: No evidence of pulmonary emboli.  Aortic atherosclerotic disease.  Mild emphysematous changes.   Electronically Signed   By: Alcide Clever M.D.   On: 09/17/2014 14:17    Microbiology: No results found for this or any previous visit (from the past 240 hour(s)).   Labs: Basic Metabolic Panel:  Recent Labs Lab 09/16/14 1110 09/17/14 0642 09/18/14 0544 09/19/14 0635  NA 139 138 138 139  K 5.1 5.5* 5.0 5.4*  CL 105 106 104 103  CO2 29 28 27  32  GLUCOSE 121* 146* 126* 143*  BUN 17 18 28* 29*  CREATININE 1.43* 1.24 1.41* 1.37*  CALCIUM  8.8* 8.6* 8.8* 8.8*   Liver Function Tests:  Recent Labs Lab 09/17/14 0642  AST 14*  ALT 10*  ALKPHOS 46  BILITOT 0.6  PROT 6.1*  ALBUMIN 3.5   No results for input(s): LIPASE, AMYLASE in the last 168 hours. No results for input(s): AMMONIA in the last 168 hours. CBC:  Recent Labs Lab 09/16/14 1110 09/17/14 0642 09/18/14 0544 09/19/14 0635  WBC 12.2* 13.7* 17.9* 12.9*  NEUTROABS 7.6  --   --   --   HGB 15.5 14.8 14.4 14.2  HCT 48.0 45.8 44.7 43.7  MCV 99.8 100.7* 101.4* 99.3  PLT 229 250 276 227   Cardiac Enzymes:  Recent Labs Lab 09/16/14 1110  TROPONINI <0.03   BNP: BNP (last 3 results) No results for input(s): BNP in the last 8760 hours.  ProBNP (last 3 results) No results for input(s): PROBNP in the last 8760 hours.  CBG:  Recent Labs Lab 09/18/14 1131 09/18/14 1700 09/18/14 2054 09/19/14 0730 09/19/14 1114  GLUCAP 110* 128* 135* 109* 112*        Signed:  Tyrees Chopin  [facility] 09/19/2014, 5:59 PM

## 2014-09-19 NOTE — Progress Notes (Signed)
Pt's IV catheter removed and intact. Pt's IV site clean dry and intact. Discharge instructions, medications and follow up appointments reviewed and discussed with patient. All questions were answered and no further questions at this time. Pt in stable condition and in no acute distress at this time. Pt escorted by nurse tech.  

## 2014-09-19 NOTE — Progress Notes (Signed)
SATURATION QUALIFICATIONS: (This note is used to comply with regulatory documentation for home oxygen)  Patient Saturations on Room Air at Rest =97 %  Patient Saturations on Room Air while Ambulating = 95%    

## 2014-09-20 ENCOUNTER — Other Ambulatory Visit (HOSPITAL_COMMUNITY): Payer: Medicare Other

## 2014-09-20 ENCOUNTER — Ambulatory Visit: Payer: Medicare Other | Admitting: Vascular Surgery

## 2014-11-01 ENCOUNTER — Telehealth: Payer: Self-pay | Admitting: Family Medicine

## 2014-11-01 NOTE — Telephone Encounter (Signed)
Pt is requesting lab orders to be sent over for his upcoming appt on 9/14 Last labs per epic were: lipid,bmp,psa,and a1c on 10/27/13

## 2014-11-02 ENCOUNTER — Other Ambulatory Visit: Payer: Self-pay | Admitting: *Deleted

## 2014-11-02 DIAGNOSIS — E785 Hyperlipidemia, unspecified: Secondary | ICD-10-CM

## 2014-11-02 DIAGNOSIS — Z79899 Other long term (current) drug therapy: Secondary | ICD-10-CM

## 2014-11-02 DIAGNOSIS — Z125 Encounter for screening for malignant neoplasm of prostate: Secondary | ICD-10-CM

## 2014-11-02 NOTE — Telephone Encounter (Signed)
a1c done 7/26. All other orders put in.

## 2014-11-02 NOTE — Telephone Encounter (Signed)
Pt.notified

## 2014-11-02 NOTE — Telephone Encounter (Signed)
Blake Cherry pt 

## 2014-11-02 NOTE — Telephone Encounter (Signed)
Lip/liv/met7/psa/HgA1C

## 2014-11-05 LAB — BASIC METABOLIC PANEL
BUN/Creatinine Ratio: 12 (ref 10–22)
BUN: 16 mg/dL (ref 8–27)
CO2: 23 mmol/L (ref 18–29)
Calcium: 9.4 mg/dL (ref 8.6–10.2)
Chloride: 102 mmol/L (ref 97–108)
Creatinine, Ser: 1.36 mg/dL — ABNORMAL HIGH (ref 0.76–1.27)
GFR calc Af Amer: 61 mL/min/{1.73_m2} (ref >59)
GFR calc non Af Amer: 53 mL/min/{1.73_m2} — ABNORMAL LOW (ref >59)
Glucose: 96 mg/dL (ref 65–99)
Potassium: 5 mmol/L (ref 3.5–5.2)
Sodium: 142 mmol/L (ref 134–144)

## 2014-11-05 LAB — HEPATIC FUNCTION PANEL
ALT: 10 IU/L (ref 0–44)
AST: 11 IU/L (ref 0–40)
Albumin: 4.1 g/dL (ref 3.6–4.8)
Alkaline Phosphatase: 65 IU/L (ref 39–117)
Bilirubin Total: 0.4 mg/dL (ref 0.0–1.2)
Bilirubin, Direct: 0.14 mg/dL (ref 0.00–0.40)
Total Protein: 6 g/dL (ref 6.0–8.5)

## 2014-11-05 LAB — LIPID PANEL
Chol/HDL Ratio: 3.2 ratio units (ref 0.0–5.0)
Cholesterol, Total: 144 mg/dL (ref 100–199)
HDL: 45 mg/dL (ref >39)
LDL Calculated: 81 mg/dL (ref 0–99)
Triglycerides: 92 mg/dL (ref 0–149)
VLDL Cholesterol Cal: 18 mg/dL (ref 5–40)

## 2014-11-05 LAB — PSA: Prostate Specific Ag, Serum: 1.4 ng/mL (ref 0.0–4.0)

## 2014-11-07 ENCOUNTER — Ambulatory Visit (INDEPENDENT_AMBULATORY_CARE_PROVIDER_SITE_OTHER): Payer: Medicare Other | Admitting: Family Medicine

## 2014-11-07 ENCOUNTER — Encounter: Payer: Self-pay | Admitting: Family Medicine

## 2014-11-07 VITALS — BP 130/64 | Ht 67.0 in | Wt 213.0 lb

## 2014-11-07 DIAGNOSIS — N289 Disorder of kidney and ureter, unspecified: Secondary | ICD-10-CM | POA: Diagnosis not present

## 2014-11-07 DIAGNOSIS — J438 Other emphysema: Secondary | ICD-10-CM | POA: Diagnosis not present

## 2014-11-07 DIAGNOSIS — I10 Essential (primary) hypertension: Secondary | ICD-10-CM | POA: Diagnosis not present

## 2014-11-07 DIAGNOSIS — Z720 Tobacco use: Secondary | ICD-10-CM | POA: Diagnosis not present

## 2014-11-07 MED ORDER — ALBUTEROL SULFATE (2.5 MG/3ML) 0.083% IN NEBU: 2.5000 mg | INHALATION_SOLUTION | RESPIRATORY_TRACT | Status: DC | PRN

## 2014-11-07 NOTE — Progress Notes (Signed)
   Subjective:    Patient ID: Blake Cherry, male    DOB: 1945-02-25, 69 y.o.   MRN: [id number]  HPIpt arrives today for med check up. Has been in hospital for copd. Saw dr Juanetta Gosling yesterday.  bloodwork done 9/10.  Pt states no concerns today. Pt declines flu vaccine today. He gets free through [employer] employer.    patient states he is trying to quit smoking he was in the hospital we reviewed overall of these results  he did have a CAT scan which did not show any pulmonary nodules I told him that I did recommend CAT scan on a yearly basis   patient does take his blood pressure medicine states he tries watch diet he does not exercise much because of his lungs moderate exercise   patient does try to watch fats in the diet  patient denies any rectal bleeding states he had a colonoscopy about 7 years  Review of Systems  Constitutional: Negative for activity change, appetite change and fatigue.  HENT: Negative for congestion.   Respiratory: Negative for cough.   Cardiovascular: Negative for chest pain.  Gastrointestinal: Negative for abdominal pain.  Endocrine: Negative for polydipsia and polyphagia.  Neurological: Negative for weakness.  Psychiatric/Behavioral: Negative for confusion.       Objective:   Physical Exam  Constitutional: He appears well-nourished. No distress.  Cardiovascular: Normal rate, regular rhythm and normal heart sounds.   No murmur heard. Pulmonary/Chest: Effort normal and breath sounds normal. No respiratory distress.  Musculoskeletal: He exhibits no edema.  Lymphadenopathy:    He has no cervical adenopathy.  Neurological: He is alert.  Psychiatric: His behavior is normal.  Vitals reviewed.         Assessment & Plan:   COPD-stable use albuterol on a regular basis if progressive troubles needs to follow-up. He sees Dr. Juanetta Gosling. I talked with the patient about pulmonary function testing I will leave that up to the pulmonologist. I'm not really sure  would add anything.   Currently he is using nebulizer treatments for his steroid as well as albuterol. This help save him co-pay cost    no stroke issues taking his aspirin on a regular basis  Cholesterol profile looks very good watch diet closely  Encourage patient to quit smoking  Blood pressure good control today.   creatinine slightly elevated we will recheck these labs again within 6 months time  Follow-up 6 months

## 2014-11-12 ENCOUNTER — Telehealth: Payer: Self-pay | Admitting: Family Medicine

## 2014-11-12 DIAGNOSIS — Z139 Encounter for screening, unspecified: Secondary | ICD-10-CM

## 2014-11-12 NOTE — Telephone Encounter (Signed)
Order for Heme occult cards x 3 put into epic.

## 2014-11-12 NOTE — Telephone Encounter (Signed)
Essentially we cover many of the items that the annual wellness does during his regular visits but his insurance needs a specific visit for annual wellness in order for it to count. #1 set up wellness exam somewhere within the next 3 weeks. #2-heme occult cards 3 for screening.

## 2014-11-12 NOTE — Telephone Encounter (Signed)
Spoke with patient and informed him that per Dr.Scott and his insurance company it is recommended that he has a wellness exam within the next 3 weeks and heme occult cards x 3 for screening. Patient verbalized understanding and was being transferred to the front to schedule an appointment.

## 2014-11-12 NOTE — Telephone Encounter (Signed)
Pts insurance was calling to see if we could set pt up for an annual wellness an  Fecal occult screening. Advised pt was just in last week, is seen bi-annually and that  The doc has not requested a wellness at this point. Do you wish to run the Fecal occult For the patient an do you feel he needs a wellness? She said if he is seen bi-annually That was fine just make sure he get these testing done.   Please advise

## 2014-12-18 ENCOUNTER — Encounter: Payer: Self-pay | Admitting: Vascular Surgery

## 2014-12-20 ENCOUNTER — Ambulatory Visit (HOSPITAL_COMMUNITY)
Admission: RE | Admit: 2014-12-20 | Discharge: 2014-12-20 | Disposition: A | Discharge status: Home / Self Care | Payer: Medicare Other | Source: Ambulatory Visit | Attending: Vascular Surgery | Admitting: Vascular Surgery

## 2014-12-20 ENCOUNTER — Ambulatory Visit (INDEPENDENT_AMBULATORY_CARE_PROVIDER_SITE_OTHER): Payer: Medicare Other | Admitting: Family

## 2014-12-20 ENCOUNTER — Encounter: Payer: Self-pay | Admitting: Family

## 2014-12-20 VITALS — BP 126/73 | HR 80 | Temp 97.2°F | Resp 16 | Ht 68.0 in | Wt 216.0 lb

## 2014-12-20 DIAGNOSIS — Z720 Tobacco use: Secondary | ICD-10-CM

## 2014-12-20 DIAGNOSIS — I714 Abdominal aortic aneurysm, without rupture, unspecified: Secondary | ICD-10-CM

## 2014-12-20 DIAGNOSIS — F17200 Nicotine dependence, unspecified, uncomplicated: Secondary | ICD-10-CM

## 2014-12-20 DIAGNOSIS — Z48812 Encounter for surgical aftercare following surgery on the circulatory system: Secondary | ICD-10-CM | POA: Insufficient documentation

## 2014-12-20 DIAGNOSIS — Z95828 Presence of other vascular implants and grafts: Secondary | ICD-10-CM

## 2014-12-20 NOTE — Patient Instructions (Signed)
Smoking Cessation, Tips for Success If you are ready to quit smoking, congratulations! You have chosen to help yourself be healthier. Cigarettes bring nicotine, tar, carbon monoxide, and other irritants into your body. Your lungs, heart, and blood vessels will be able to work better without these poisons. There are many different ways to quit smoking. Nicotine gum, nicotine patches, a nicotine inhaler, or nicotine nasal spray can help with physical craving. Hypnosis, support groups, and medicines help break the habit of smoking. WHAT THINGS CAN I DO TO MAKE QUITTING EASIER?  Here are some tips to help you quit for good:  Pick a date when you will quit smoking completely. Tell all of your friends and family about your plan to quit on that date.  Do not try to slowly cut down on the number of cigarettes you are smoking. Pick a quit date and quit smoking completely starting on that day.  Throw away all cigarettes.   Clean and remove all ashtrays from your home, work, and car.  On a card, write down your reasons for quitting. Carry the card with you and read it when you get the urge to smoke.  Cleanse your body of nicotine. Drink enough water and fluids to keep your urine clear or pale yellow. Do this after quitting to flush the nicotine from your body.  Learn to predict your moods. Do not let a bad situation be your excuse to have a cigarette. Some situations in your life might tempt you into wanting a cigarette.  Never have "just one" cigarette. It leads to wanting another and another. Remind yourself of your decision to quit.  Change habits associated with smoking. If you smoked while driving or when feeling stressed, try other activities to replace smoking. Stand up when drinking your coffee. Brush your teeth after eating. Sit in a different chair when you read the paper. Avoid alcohol while trying to quit, and try to drink fewer caffeinated beverages. Alcohol and caffeine may urge you to  smoke.  Avoid foods and drinks that can trigger a desire to smoke, such as sugary or spicy foods and alcohol.  Ask people who smoke not to smoke around you.  Have something planned to do right after eating or having a cup of coffee. For example, plan to take a walk or exercise.  Try a relaxation exercise to calm you down and decrease your stress. Remember, you may be tense and nervous for the first 2 weeks after you quit, but this will pass.  Find new activities to keep your hands busy. Play with a pen, coin, or rubber band. Doodle or draw things on paper.  Brush your teeth right after eating. This will help cut down on the craving for the taste of tobacco after meals. You can also try mouthwash.   Use oral substitutes in place of cigarettes. Try using lemon drops, carrots, cinnamon sticks, or chewing gum. Keep them handy so they are available when you have the urge to smoke.  When you have the urge to smoke, try deep breathing.  Designate your home as a nonsmoking area.  If you are a heavy smoker, ask your health care provider about a prescription for nicotine chewing gum. It can ease your withdrawal from nicotine.  Reward yourself. Set aside the cigarette money you save and buy yourself something nice.  Look for support from others. Join a support group or smoking cessation program. Ask someone at home or at work to help you with your plan   to quit smoking.  Always ask yourself, "Do I need this cigarette or is this just a reflex?" Tell yourself, "Today, I choose not to smoke," or "I do not want to smoke." You are reminding yourself of your decision to quit.  Do not replace cigarette smoking with electronic cigarettes (commonly called e-cigarettes). The safety of e-cigarettes is unknown, and some may contain harmful chemicals.  If you relapse, do not give up! Plan ahead and think about what you will do the next time you get the urge to smoke. HOW WILL I FEEL WHEN I QUIT SMOKING? You  may have symptoms of withdrawal because your body is used to nicotine (the addictive substance in cigarettes). You may crave cigarettes, be irritable, feel very hungry, cough often, get headaches, or have difficulty concentrating. The withdrawal symptoms are only temporary. They are strongest when you first quit but will go away within 10-14 days. When withdrawal symptoms occur, stay in control. Think about your reasons for quitting. Remind yourself that these are signs that your body is healing and getting used to being without cigarettes. Remember that withdrawal symptoms are easier to treat than the major diseases that smoking can cause.  Even after the withdrawal is over, expect periodic urges to smoke. However, these cravings are generally short lived and will go away whether you smoke or not. Do not smoke! WHAT RESOURCES ARE AVAILABLE TO HELP ME QUIT SMOKING? Your health care provider can direct you to community resources or hospitals for support, which may include:  Group support.  Education.  Hypnosis.  Therapy.   This information is not intended to replace advice given to you by your health care provider. Make sure you discuss any questions you have with your health care provider.   Document Released: 11/08/2003 Document Revised: 03/02/2014 Document Reviewed: 07/28/2012 Elsevier Interactive Patient Education 2016 Elsevier Inc.    Steps to Quit Smoking  Smoking tobacco can be harmful to your health and can affect almost every organ in your body. Smoking puts you, and those around you, at risk for developing many serious chronic diseases. Quitting smoking is difficult, but it is one of the best things that you can do for your health. It is never too late to quit. WHAT ARE THE BENEFITS OF QUITTING SMOKING? When you quit smoking, you lower your risk of developing serious diseases and conditions, such as:  Lung cancer or lung disease, such as COPD.  Heart disease.  Stroke.  Heart  attack.  Infertility.  Osteoporosis and bone fractures. Additionally, symptoms such as coughing, wheezing, and shortness of breath may get better when you quit. You may also find that you get sick less often because your body is stronger at fighting off colds and infections. If you are pregnant, quitting smoking can help to reduce your chances of having a baby of low birth weight. HOW DO I GET READY TO QUIT? When you decide to quit smoking, create a plan to make sure that you are successful. Before you quit:  Pick a date to quit. Set a date within the next two weeks to give you time to prepare.  Write down the reasons why you are quitting. Keep this list in places where you will see it often, such as on your bathroom mirror or in your car or wallet.  Identify the people, places, things, and activities that make you want to smoke (triggers) and avoid them. Make sure to take these actions:  Throw away all cigarettes at home, at work,   and in your car.  Throw away smoking accessories, such as ashtrays and lighters.  Clean your car and make sure to empty the ashtray.  Clean your home, including curtains and carpets.  Tell your family, friends, and coworkers that you are quitting. Support from your loved ones can make quitting easier.  Talk with your health care provider about your options for quitting smoking.  Find out what treatment options are covered by your health insurance. WHAT STRATEGIES CAN I USE TO QUIT SMOKING?  Talk with your healthcare provider about different strategies to quit smoking. Some strategies include:  Quitting smoking altogether instead of gradually lessening how much you smoke over a period of time. Research shows that quitting "cold turkey" is more successful than gradually quitting.  Attending in-person counseling to help you build problem-solving skills. You are more likely to have success in quitting if you attend several counseling sessions. Even short  sessions of 10 minutes can be effective.  Finding resources and support systems that can help you to quit smoking and remain smoke-free after you quit. These resources are most helpful when you use them often. They can include:  Online chats with a counselor.  Telephone quitlines.  Printed self-help materials.  Support groups or group counseling.  Text messaging programs.  Mobile phone applications.  Taking medicines to help you quit smoking. (If you are pregnant or breastfeeding, talk with your health care provider first.) Some medicines contain nicotine and some do not. Both types of medicines help with cravings, but the medicines that include nicotine help to relieve withdrawal symptoms. Your health care provider may recommend:  Nicotine patches, gum, or lozenges.  Nicotine inhalers or sprays.  Non-nicotine medicine that is taken by mouth. Talk with your health care provider about combining strategies, such as taking medicines while you are also receiving in-person counseling. Using these two strategies together makes you more likely to succeed in quitting than if you used either strategy on its own. If you are pregnant or breastfeeding, talk with your health care provider about finding counseling or other support strategies to quit smoking. Do not take medicine to help you quit smoking unless told to do so by your health care provider. WHAT THINGS CAN I DO TO MAKE IT EASIER TO QUIT? Quitting smoking might feel overwhelming at first, but there is a lot that you can do to make it easier. Take these important actions:  Reach out to your family and friends and ask that they support and encourage you during this time. Call telephone quitlines, reach out to support groups, or work with a counselor for support.  Ask people who smoke to avoid smoking around you.  Avoid places that trigger you to smoke, such as bars, parties, or smoke-break areas at work.  Spend time around people who do  not smoke.  Lessen stress in your life, because stress can be a smoking trigger for some people. To lessen stress, try:  Exercising regularly.  Deep-breathing exercises.  Yoga.  Meditating.  Performing a body scan. This involves closing your eyes, scanning your body from head to toe, and noticing which parts of your body are particularly tense. Purposefully relax the muscles in those areas.  Download or purchase mobile phone or tablet apps (applications) that can help you stick to your quit plan by providing reminders, tips, and encouragement. There are many free apps, such as QuitGuide from the CDC (Centers for Disease Control and Prevention). You can find other support for quitting smoking (smoking   cessation) through smokefree.gov and other websites. HOW WILL I FEEL WHEN I QUIT SMOKING? Within the first 24 hours of quitting smoking, you may start to feel some withdrawal symptoms. These symptoms are usually most noticeable 2-3 days after quitting, but they usually do not last beyond 2-3 weeks. Changes or symptoms that you might experience include:  Mood swings.  Restlessness, anxiety, or irritation.  Difficulty concentrating.  Dizziness.  Strong cravings for sugary foods in addition to nicotine.  Mild weight gain.  Constipation.  Nausea.  Coughing or a sore throat.  Changes in how your medicines work in your body.  A depressed mood.  Difficulty sleeping (insomnia). After the first 2-3 weeks of quitting, you may start to notice more positive results, such as:  Improved sense of smell and taste.  Decreased coughing and sore throat.  Slower heart rate.  Lower blood pressure.  Clearer skin.  The ability to breathe more easily.  Fewer sick days. Quitting smoking is very challenging for most people. Do not get discouraged if you are not successful the first time. Some people need to make many attempts to quit before they achieve long-term success. Do your best to  stick to your quit plan, and talk with your health care provider if you have any questions or concerns.   This information is not intended to replace advice given to you by your health care provider. Make sure you discuss any questions you have with your health care provider.   Document Released: 02/03/2001 Document Revised: 06/26/2014 Document Reviewed: 06/26/2014 Elsevier Interactive Patient Education 2016 Elsevier Inc.  

## 2014-12-20 NOTE — Progress Notes (Signed)
[facility] [place]  Established EVAR  History of Present Illness  Blake Cherry is a 69 y.o. ([birth year]) male patient of Dr. 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 On 03/10/13 Dr. Darrick Penna performed angioplasty of left iliac limb of stent graft.  The patient denies new abdominal or back pain. The patient's atherosclerotic risk factors remain current smoking and hypertension. He denies any claudication symptoms. He has occasional numbness in his left leg due to prior stroke in 2007.  He was diagnosed with COPD in early in 2016.  Pt Diabetic: No Pt smoker: smoker  (3-4 cigarettes/day, decreased from 1.5 ppd, started in the 1960's)   Past Medical History  Diagnosis Date  . AAA (abdominal aortic aneurysm) (HCC)   . Carotid artery occlusion 2007    Left cea  . Stroke [facility]) 05/2005     2 in 2007.  Weakness and numbness in left arm and leg  . Diverticulitis     last time 4 years ago  . Hypertension     per Dr Tenny Craw  . Shortness of breath     with exertion  . COPD (chronic obstructive pulmonary disease) [facility])    Past Surgical History  Procedure Laterality Date  . Carotid stent  2007    left internal carotid artery  . Carotid endarterectomy Left 2007  . Testicle torsion reduction      age of 66  . Abdominal aortic endovascular stent graft N/A 08/22/2012    Procedure: ABDOMINAL AORTIC ENDOVASCULAR STENT GRAFT;  Surgeon: Sherren Kerns, MD;  Location: [facility] OR;  Service: Vascular;  Laterality: N/A;  . Abdominal aortagram N/A 03/10/2013    Procedure: ABDOMINAL Ronny Flurry;  Surgeon: Sherren Kerns, MD;  Location: [facility] CATH LAB;  Service: Cardiovascular;  Laterality: N/A;   Social History Social History  Substance Use Topics  . Smoking status: Light Tobacco Smoker -- 1.00 packs/day for 50 years    Types: Cigarettes  . Smokeless tobacco: Former Neurosurgeon    Types: Snuff  . Alcohol Use: No   Family  History Family History  Problem Relation Age of Onset  . Cancer Mother     Breast cancer  . Hypertension Mother   . Diabetes Mother   . Diabetes Father    Current Outpatient Prescriptions on File Prior to Visit  Medication Sig Dispense Refill  . albuterol (PROVENTIL HFA;VENTOLIN HFA) 108 (90 BASE) MCG/ACT inhaler Inhale 2 puffs into the lungs every 4 (four) hours as needed for wheezing or shortness of breath. 1 Inhaler 3  . albuterol (PROVENTIL) (2.5 MG/3ML) 0.083% nebulizer solution Take 3 mLs (2.5 mg total) by nebulization every 4 (four) hours as needed for wheezing. 225 mL 12  . aspirin 325 MG tablet Take 325 mg by mouth at bedtime.     . benzonatate (TESSALON) 100 MG capsule Take 1 capsule (100 mg total) by mouth 3 (three) times daily as needed for cough. 30 capsule 2  . guaiFENesin (MUCINEX) 600 MG 12 hr tablet Take 1 tablet (600 mg total) by mouth 2 (two) times daily. 20 tablet 0  . lisinopril (PRINIVIL,ZESTRIL) 2.5 MG tablet   1  . SYMBICORT 160-4.5 MCG/ACT inhaler   0  . tiotropium (SPIRIVA) 18 MCG inhalation capsule Place 1 capsule (18 mcg total) into inhaler and inhale daily. 30 capsule 12   No current facility-administered medications on file prior to visit.   No Known Allergies   ROS: See HPI  for pertinent positives and negatives.  Physical Examination  Filed Vitals:   12/20/14 0911  BP: 126/73  Pulse: 80  Temp: 97.2 F (36.2 C)  TempSrc: Oral  Resp: 16  Height: 5\' 8"  (1.727 m)  Weight: 216 lb (97.977 kg)  SpO2: 94%   Body mass index is 32.85 kg/(m^2).  General: A&O x 3, WD, obese male.  Pulmonary: Sym exp, limited air movt, no rales or rhonchi, + expiratory wheezing.  Cardiac: RRR, Nl S1, S2, no murmur appreciated  Vascular: Vessel Right Left  Radial 2+Palpable 2+Palpable  Carotid  without bruit  without bruit  Aorta Not palpable N/A  Femoral 2+ Palpable 2+Palpable  Popliteal 2+ palpable Not palpable  PT 2+Palpable Not Palpable  DP 1+Palpable  2+Palpable   Gastrointestinal: soft, NTND, -G/R, - HSM, - palpable masses, - CVAT B.  Musculoskeletal: M/S 5/5 throughout, extremities without ischemic changes.  Neurologic: Pain and light touch intact in extremities, Motor exam as listed above   CTA Abd/Pelvis Duplex (Date: 09/14/13) Infrarenal aortic stent graft is patent. Limbs extend into the common iliac arteries bilaterally. The aneurysm sac now measures 4.3 x 4.1 cm on sequence 5, image 78 and previously measured 4.6 x 4.2 cm. No evidence for an endoleak.     Non-Invasive Vascular Imaging  EVAR Duplex (Date: 12/20/2014) Very technically difficult study due to body habitus and bowel gas. Pt was not NPO for this exam. Residual sac measures between 3.6 cm and 4.2 cm on today's exam. Unable to precisely measure sac due to bowel gas and body habitus. Comparison to previous exam not appropriate due to difficult exam.  Nevertheless the aneurysm sac measures no larger than the CTA measurement on 09/14/13.     Medical Decision Making  Blake Cherry is a 69 y.o. 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 On 03/10/13 Dr. Darrick PennaFields performed angioplasty of left iliac limb of stent graft.   Pt is asymptomatic. Today's EVAR duplex was a very technically difficult study due to body habitus and bowel gas. Pt was not NPO for this exam. Residual sac measures between 3.6 cm and 4.2 cm on today's exam. Unable to precisely measure sac due to bowel gas and body habitus.    I discussed with the patient the importance of surveillance of the endograft.  The next endograft duplex will be scheduled for 12 months.  The patient will follow up with us in 12 months with these studies.  I emphasized the importance of maximal medical management including strict control of blood pressure, blood glucose, and lipid levels, antiplatelet agents, obtaining regular exercise, and cessation of smoking.   Thank you for  allowing us to participate in this patient's care.  Charisse MarchSuzanne Stanislaw Acton, RN, MSN, FNP-C [facility] [place] Office: [phone]  Clinic Physician: Darrick PennaFields  12/20/2014, 9:11 AM

## 2014-12-28 ENCOUNTER — Other Ambulatory Visit: Payer: Self-pay | Admitting: *Deleted

## 2014-12-28 ENCOUNTER — Telehealth: Payer: Self-pay | Admitting: Family Medicine

## 2014-12-28 MED ORDER — AZITHROMYCIN 250 MG PO TABS: ORAL_TABLET | ORAL | Status: DC

## 2014-12-28 MED ORDER — PREDNISONE 20 MG PO TABS: ORAL_TABLET | ORAL | Status: AC

## 2014-12-28 NOTE — Telephone Encounter (Signed)
Patient has had recent COPD attacks and feels that he is going to have another one soon.  He is going out of town tomorrow and wants to know if we can call in the prednisone and antibiotic that we typically call in.   [facility]

## 2014-12-28 NOTE — Telephone Encounter (Signed)
zpack and pred taper per dr Lorin Picketscott. Med sent to pharm. Pt notified.

## 2015-03-19 ENCOUNTER — Encounter: Payer: Self-pay | Admitting: Family Medicine

## 2015-03-19 ENCOUNTER — Ambulatory Visit (INDEPENDENT_AMBULATORY_CARE_PROVIDER_SITE_OTHER): Payer: PPO | Admitting: Family Medicine

## 2015-03-19 VITALS — Temp 98.0°F | Wt 215.0 lb

## 2015-03-19 DIAGNOSIS — J441 Chronic obstructive pulmonary disease with (acute) exacerbation: Secondary | ICD-10-CM | POA: Diagnosis not present

## 2015-03-19 MED ORDER — PREDNISONE 20 MG PO TABS: ORAL_TABLET | ORAL | Status: DC

## 2015-03-19 MED ORDER — AZITHROMYCIN 250 MG PO TABS: ORAL_TABLET | ORAL | Status: DC

## 2015-03-19 NOTE — Progress Notes (Signed)
   Subjective:    Patient ID: Blake Cherry, male    DOB: 1945/12/04, 70 y.o.   MRN: [id number]  URI  This is a recurrent problem. The current episode started in the past 7 days. The problem has been gradually worsening. There has been no fever. Associated symptoms include congestion, coughing and rhinorrhea. Pertinent negatives include no chest pain, ear pain or wheezing.   Patient relates over the course of the past week he is at increase cough congestion bringing up some phlegm denies high fever chills might ahead some flulike illness on Sunday but no high fever with it. In addition to this cough congestion increasing shortness of breath over the past few days no swelling in his legs no PND has known history of COPD still smokes even though he is been counseled to quit   Review of Systems  Constitutional: Negative for fever and activity change.  HENT: Positive for congestion and rhinorrhea. Negative for ear pain.   Eyes: Negative for discharge.  Respiratory: Positive for cough. Negative for wheezing.   Cardiovascular: Negative for chest pain.       Objective:   Physical Exam  Constitutional: He appears well-developed.  HENT:  Head: Normocephalic.  Mouth/Throat: Oropharynx is clear and moist. No oropharyngeal exudate.  Neck: Normal range of motion.  Cardiovascular: Normal rate, regular rhythm and normal heart sounds.   No murmur heard. Pulmonary/Chest: Effort normal. He has wheezes.  Lymphadenopathy:    He has no cervical adenopathy.  Neurological: He exhibits normal muscle tone.  Skin: Skin is warm and dry.  Nursing note and vitals reviewed.         Assessment & Plan:  COPD exacerbation-prednisone taper, antibiotics prescribed, patient is encouraged to use his long-acting nebulizer both Pulmicort and Brovana patient having difficult time affording these but is trying hard. Using albuterol on a fairly frequent basis. He is going to be following up with pulmonology in the  coming weeks and he will follow-up with [place] in the springtime if he has high fever chills sweats or gets worse he will need recheck plus also chest x-ray and lab work. O2 sat today 94%

## 2015-04-23 ENCOUNTER — Other Ambulatory Visit: Payer: Self-pay | Admitting: Family Medicine

## 2015-05-07 ENCOUNTER — Ambulatory Visit (INDEPENDENT_AMBULATORY_CARE_PROVIDER_SITE_OTHER): Payer: PPO | Admitting: Family Medicine

## 2015-05-07 ENCOUNTER — Encounter: Payer: Self-pay | Admitting: Family Medicine

## 2015-05-07 VITALS — BP 126/72 | HR 96 | Ht 67.0 in | Wt 217.4 lb

## 2015-05-07 DIAGNOSIS — J441 Chronic obstructive pulmonary disease with (acute) exacerbation: Secondary | ICD-10-CM

## 2015-05-07 DIAGNOSIS — L309 Dermatitis, unspecified: Secondary | ICD-10-CM

## 2015-05-07 MED ORDER — LEVOFLOXACIN 500 MG PO TABS: 500.0000 mg | ORAL_TABLET | Freq: Every day | ORAL | Status: DC

## 2015-05-07 MED ORDER — BUDESONIDE-FORMOTEROL FUMARATE 160-4.5 MCG/ACT IN AERO: 2.0000 | INHALATION_SPRAY | Freq: Two times a day (BID) | RESPIRATORY_TRACT | Status: DC

## 2015-05-07 MED ORDER — MOMETASONE FUROATE 0.1 % EX CREA: TOPICAL_CREAM | CUTANEOUS | Status: AC

## 2015-05-07 MED ORDER — PREDNISONE 20 MG PO TABS: ORAL_TABLET | ORAL | Status: DC

## 2015-05-07 MED ORDER — LISINOPRIL 2.5 MG PO TABS: 2.5000 mg | ORAL_TABLET | Freq: Every day | ORAL | Status: DC

## 2015-05-07 NOTE — Progress Notes (Signed)
   Subjective:    Patient ID: Blake Cherry, male    DOB: 11/27/1945, 70 y.o.   MRN: [id number]  Hypertension This is a chronic problem. The current episode started more than 1 year ago. There are no compliance problems.     Patient has concerns of SOB on with activity.  Patient would also like to discuss possible psoriasis to bilateral arms.   patient relates some progressive shortness of breath with activity but denies any chest pressure or swelling in the legs. In addition to this he states that his inhaler seemed to help a little bit he does cough up some yellowish phlegm. Patient walked 4 laps around office. Oxygen saturation prior to walking at rest is 92% (Room Air), Post 4 laps Oxygen saturation was 90%   patient still smokes but he does know he needs to try to quit smoking.  Review of Systems  patient relates shortness of breath with activity but denies chest tightness pressure pain nausea vomiting diarrhea    Objective:   Physical Exam   heart is regular lungs are clear pulse normal O2 saturation 93% at rest after walking several labs O2 saturation 90% extremities no edema does have chronic skin rash on the arms it could well be psoriasis versus dermatitis      Assessment & Plan:   psoriasis versus dermatitis-strong steroid cream recommended  COPD exacerbation antibiotics steroids change in inhalers recommended also pulmonary function tests will be set up also patient will need to follow-up with Dr. Juanetta GoslingHawkins next month follow-up here in approximately 5-6 months comprehensive lab work at that time

## 2015-05-29 ENCOUNTER — Ambulatory Visit (HOSPITAL_COMMUNITY)
Admission: RE | Admit: 2015-05-29 | Discharge: 2015-05-29 | Disposition: A | Discharge status: Home / Self Care | Payer: PPO | Source: Ambulatory Visit | Attending: Family Medicine | Admitting: Family Medicine

## 2015-05-29 DIAGNOSIS — J441 Chronic obstructive pulmonary disease with (acute) exacerbation: Secondary | ICD-10-CM | POA: Diagnosis not present

## 2015-05-29 LAB — PULMONARY FUNCTION TEST
FEF 25-75 Post: 0.44 L/sec
FEF 25-75 Pre: 0.4 L/sec
FEF2575-%Change-Post: 10 %
FEF2575-%Pred-Post: 18 %
FEF2575-%Pred-Pre: 16 %
FEV1-%Change-Post: 6 %
FEV1-%Pred-Post: 34 %
FEV1-%Pred-Pre: 32 %
FEV1-Post: 1.06 L
FEV1-Pre: 0.99 L
FEV1FVC-%Change-Post: 2 %
FEV1FVC-%Pred-Pre: 63 %
FEV6-%Change-Post: 3 %
FEV6-%Pred-Post: 53 %
FEV6-%Pred-Pre: 51 %
FEV6-Post: 2.09 L
FEV6-Pre: 2.01 L
FEV6FVC-%Change-Post: -1 %
FEV6FVC-%Pred-Post: 100 %
FEV6FVC-%Pred-Pre: 101 %
FVC-%Change-Post: 4 %
FVC-%Pred-Post: 53 %
FVC-%Pred-Pre: 51 %
FVC-Post: 2.21 L
FVC-Pre: 2.12 L
Post FEV1/FVC ratio: 48 %
Post FEV6/FVC ratio: 95 %
Pre FEV1/FVC ratio: 47 %
Pre FEV6/FVC Ratio: 96 %

## 2015-05-29 MED ORDER — ALBUTEROL SULFATE (2.5 MG/3ML) 0.083% IN NEBU
2.5000 mg | INHALATION_SOLUTION | Freq: Once | RESPIRATORY_TRACT | Status: AC
  Administered 2015-05-29: 2.5 mg via RESPIRATORY_TRACT

## 2015-06-12 DIAGNOSIS — I10 Essential (primary) hypertension: Secondary | ICD-10-CM | POA: Diagnosis not present

## 2015-06-12 DIAGNOSIS — F17290 Nicotine dependence, other tobacco product, uncomplicated: Secondary | ICD-10-CM | POA: Diagnosis not present

## 2015-06-12 DIAGNOSIS — R0602 Shortness of breath: Secondary | ICD-10-CM | POA: Diagnosis not present

## 2015-06-12 DIAGNOSIS — J441 Chronic obstructive pulmonary disease with (acute) exacerbation: Secondary | ICD-10-CM | POA: Diagnosis not present

## 2015-06-13 ENCOUNTER — Other Ambulatory Visit (HOSPITAL_COMMUNITY): Payer: Self-pay | Admitting: Pulmonary Disease

## 2015-06-13 ENCOUNTER — Ambulatory Visit (HOSPITAL_COMMUNITY)
Admission: RE | Admit: 2015-06-13 | Discharge: 2015-06-13 | Disposition: A | Discharge status: Home / Self Care | Payer: PPO | Source: Ambulatory Visit | Attending: Pulmonary Disease | Admitting: Pulmonary Disease

## 2015-06-13 DIAGNOSIS — R0602 Shortness of breath: Secondary | ICD-10-CM | POA: Diagnosis not present

## 2015-06-13 DIAGNOSIS — R918 Other nonspecific abnormal finding of lung field: Secondary | ICD-10-CM | POA: Insufficient documentation

## 2015-06-13 DIAGNOSIS — R05 Cough: Secondary | ICD-10-CM | POA: Diagnosis not present

## 2015-08-04 ENCOUNTER — Other Ambulatory Visit: Payer: Self-pay | Admitting: Family Medicine

## 2015-08-22 ENCOUNTER — Ambulatory Visit (INDEPENDENT_AMBULATORY_CARE_PROVIDER_SITE_OTHER): Payer: PPO | Admitting: Nurse Practitioner

## 2015-08-22 ENCOUNTER — Encounter: Payer: Self-pay | Admitting: Nurse Practitioner

## 2015-08-22 VITALS — BP 118/66 | Temp 98.4°F | Ht 67.0 in | Wt 218.0 lb

## 2015-08-22 DIAGNOSIS — J441 Chronic obstructive pulmonary disease with (acute) exacerbation: Secondary | ICD-10-CM | POA: Diagnosis not present

## 2015-08-22 MED ORDER — LEVOFLOXACIN 500 MG PO TABS: 500.0000 mg | ORAL_TABLET | Freq: Every day | ORAL | Status: DC

## 2015-08-22 MED ORDER — PREDNISONE 20 MG PO TABS: ORAL_TABLET | ORAL | Status: DC

## 2015-08-22 NOTE — Progress Notes (Signed)
Subjective:  Presents for complaints of a COPD flareup for the past 2 weeks. Patient was not taking his Spiriva on a regular basis due to cost. Has found another source when he can get a 90 day supply cheaper. Used this yesterday which seemed to help his breathing. Taking albuterol nebulizers 3-4 times per day. Last treatment was about 2 hours ago. States his breathing right now is the best that is been. No fever. Slight sore throat. Sinus pressure at times. Head congestion mainly in the mornings. Increase cough producing yellow mucus. No vomiting diarrhea. No urinary symptoms. Mild cramping and pain along the bilateral upper abdomen near the costal border worse with cough.  Objective:   BP 118/66 mmHg  Temp(Src) 98.4 F (36.9 C) (Oral)  Ht 5\' 7"  (1.702 m)  Wt 218 lb (98.884 kg)  BMI 34.14 kg/m2  SpO2 94% NAD. Alert, oriented. TMs clear effusion, no erythema. Pharynx injected, PND noted. Neck supple with mild soft anterior adenopathy. Lungs breath sounds diminished in general, occasional faint sonorous sounds noted on expiration. No tachypnea but short of breath with talking. Pursed lip breathing. Pink face. Heart regular rate rhythm. Abdomen obese with mild tenderness noted along the bilateral upper abdomen near the costal margin, otherwise benign.  Assessment:  Problem List Items Addressed This Visit      Respiratory   COPD with acute exacerbation (HCC) - Primary   Relevant Medications   predniSONE (DELTASONE) 20 MG tablet     Plan:  Meds ordered this encounter  Medications  . levofloxacin (LEVAQUIN) 500 MG tablet    Sig: Take 1 tablet (500 mg total) by mouth daily.    Dispense:  10 tablet    Refill:  0    Order Specific Question:  Supervising Provider    Answer:  Merlyn AlbertLUKING, Damyen S [[id number]]  . predniSONE (DELTASONE) 20 MG tablet    Sig: 3qd for 3d then 2qd for 3d then 1qd for 3d    Dispense:  18 tablet    Refill:  0    Order Specific Question:  Supervising Provider    Answer:   Merlyn AlbertLUKING, Bran S [[id number]]   Patient wishes to repeat a course of Levaquin which seemed to help the most as far as antibiotics. Discussed risk of tendon rupture. Encourage patient to takes brief a daily as preventive measure. Call back by early next week if no improvement, call or go to ED sooner if worse. Patient attempted to see Dr. Juanetta GoslingHawkins his pulmonologist but was unable to get an appointment.

## 2015-10-02 ENCOUNTER — Other Ambulatory Visit: Payer: Self-pay | Admitting: Nurse Practitioner

## 2015-10-14 DIAGNOSIS — E669 Obesity, unspecified: Secondary | ICD-10-CM | POA: Diagnosis not present

## 2015-10-14 DIAGNOSIS — J441 Chronic obstructive pulmonary disease with (acute) exacerbation: Secondary | ICD-10-CM | POA: Diagnosis not present

## 2015-10-14 DIAGNOSIS — I10 Essential (primary) hypertension: Secondary | ICD-10-CM | POA: Diagnosis not present

## 2015-11-07 ENCOUNTER — Ambulatory Visit (INDEPENDENT_AMBULATORY_CARE_PROVIDER_SITE_OTHER): Payer: PPO | Admitting: Family Medicine

## 2015-11-07 ENCOUNTER — Encounter: Payer: Self-pay | Admitting: Family Medicine

## 2015-11-07 VITALS — BP 132/84 | HR 105 | Ht 67.0 in | Wt 217.0 lb

## 2015-11-07 DIAGNOSIS — E785 Hyperlipidemia, unspecified: Secondary | ICD-10-CM

## 2015-11-07 DIAGNOSIS — Z720 Tobacco use: Secondary | ICD-10-CM

## 2015-11-07 DIAGNOSIS — J431 Panlobular emphysema: Secondary | ICD-10-CM | POA: Diagnosis not present

## 2015-11-07 DIAGNOSIS — I10 Essential (primary) hypertension: Secondary | ICD-10-CM | POA: Diagnosis not present

## 2015-11-07 DIAGNOSIS — Z125 Encounter for screening for malignant neoplasm of prostate: Secondary | ICD-10-CM

## 2015-11-07 DIAGNOSIS — Z23 Encounter for immunization: Secondary | ICD-10-CM

## 2015-11-07 DIAGNOSIS — Z79899 Other long term (current) drug therapy: Secondary | ICD-10-CM | POA: Diagnosis not present

## 2015-11-07 MED ORDER — BUDESONIDE-FORMOTEROL FUMARATE 160-4.5 MCG/ACT IN AERO: 2.0000 | INHALATION_SPRAY | Freq: Two times a day (BID) | RESPIRATORY_TRACT | 3 refills | Status: DC

## 2015-11-07 MED ORDER — TIOTROPIUM BROMIDE MONOHYDRATE 18 MCG IN CAPS: 18.0000 ug | ORAL_CAPSULE | Freq: Every day | RESPIRATORY_TRACT | 3 refills | Status: DC

## 2015-11-07 NOTE — Progress Notes (Signed)
   Subjective:    Patient ID: Blake Cherry, male    DOB: 10/08/1945, 70 y.o.   MRN: [id number]  HPImed check up. Pt main concern today is COPD.  This patient presents for follow-up regarding his COPD this is been giving him significant trouble over the course of the past few months he has had 2 different flareups in the past 4 months and recently he has symptoms of chronic bronchitis with coughing up clear phlegm he does relate shortness of breath with significant activity in finds himself [place] [employer] tailing what he does because of his shortness of breath he denies any chest tightness pressure pain He does take his cholesterol medicine and is conscious of the amount of fried foods.  He still smokes he knows he needs to quit to lower the risk of heart attacks and strokes. We went over all of his medicines including mechanism of action with the COPD medications  Review of Systems  Constitutional: Negative for activity change, fatigue and fever.  Respiratory: Negative for cough and shortness of breath.   Cardiovascular: Negative for chest pain and leg swelling.  Neurological: Negative for headaches.       Objective:   Physical Exam  Constitutional: He appears well-nourished. No distress.  Cardiovascular: Normal rate, regular rhythm and normal heart sounds.   No murmur heard. Pulmonary/Chest: Effort normal. No respiratory distress. He has wheezes. He has no rales. He exhibits no tenderness.  Musculoskeletal: He exhibits no edema.  Lymphadenopathy:    He has no cervical adenopathy.  Neurological: He is alert.  Psychiatric: His behavior is normal.  Vitals reviewed.    He sees Dr. Juanetta GoslingHawkins on a regular basis who gave him a standing prescription for steroids and antibiotics if he needs it. I did talk with the patient about warning signs of COPD flareups as well as pneumonia     Assessment & Plan:  Hyperlipidemia continue medication previous labs reviewed current labs ordered await the  results  HTN decent control watch salt in diet try to exercise some and bring weight down continue medication  Previous stroke no sign of new stroke   Tobacco abuse patient counseled to quit smoking  Emphysema follow-up with Dr. Juanetta GoslingHawkins in approximately 3 months follow-up with us in 6 months importance of using medications discussed in detail  Immunizations today screening labs conducted as well 25 minutes was spent with the patient. Greater than half the time was spent in discussion and answering questions and counseling regarding the issues that the patient came in for today.

## 2015-11-15 ENCOUNTER — Other Ambulatory Visit: Payer: Self-pay | Admitting: Family Medicine

## 2015-11-15 DIAGNOSIS — Z79899 Other long term (current) drug therapy: Secondary | ICD-10-CM | POA: Diagnosis not present

## 2015-11-15 DIAGNOSIS — Z125 Encounter for screening for malignant neoplasm of prostate: Secondary | ICD-10-CM | POA: Diagnosis not present

## 2015-11-15 DIAGNOSIS — I10 Essential (primary) hypertension: Secondary | ICD-10-CM | POA: Diagnosis not present

## 2015-11-15 DIAGNOSIS — E785 Hyperlipidemia, unspecified: Secondary | ICD-10-CM | POA: Diagnosis not present

## 2015-11-16 LAB — BASIC METABOLIC PANEL
BUN/Creatinine Ratio: 11 (ref 10–24)
BUN: 16 mg/dL (ref 8–27)
CO2: 24 mmol/L (ref 18–29)
Calcium: 9.5 mg/dL (ref 8.6–10.2)
Chloride: 102 mmol/L (ref 96–106)
Creatinine, Ser: 1.48 mg/dL — ABNORMAL HIGH (ref 0.76–1.27)
GFR calc Af Amer: 55 mL/min/{1.73_m2} — ABNORMAL LOW (ref >59)
GFR calc non Af Amer: 48 mL/min/{1.73_m2} — ABNORMAL LOW (ref >59)
Glucose: 99 mg/dL (ref 65–99)
Potassium: 5.3 mmol/L — ABNORMAL HIGH (ref 3.5–5.2)
Sodium: 142 mmol/L (ref 134–144)

## 2015-11-16 LAB — PSA: Prostate Specific Ag, Serum: 1.2 ng/mL (ref 0.0–4.0)

## 2015-11-16 LAB — HEPATIC FUNCTION PANEL
ALT: 10 IU/L (ref 0–44)
AST: 15 IU/L (ref 0–40)
Albumin: 4 g/dL (ref 3.6–4.8)
Alkaline Phosphatase: 64 IU/L (ref 39–117)
Bilirubin Total: 0.4 mg/dL (ref 0.0–1.2)
Bilirubin, Direct: 0.15 mg/dL (ref 0.00–0.40)
Total Protein: 6.5 g/dL (ref 6.0–8.5)

## 2015-11-16 LAB — LIPID PANEL
Chol/HDL Ratio: 3 ratio units (ref 0.0–5.0)
Cholesterol, Total: 140 mg/dL (ref 100–199)
HDL: 47 mg/dL (ref >39)
LDL Calculated: 63 mg/dL (ref 0–99)
Triglycerides: 151 mg/dL — ABNORMAL HIGH (ref 0–149)
VLDL Cholesterol Cal: 30 mg/dL (ref 5–40)

## 2015-11-19 NOTE — Addendum Note (Signed)
Addended by: Margaretha SheffieldBROWN, Tyquez Hollibaugh S on: 11/19/2015 10:24 AM   Modules accepted: Orders

## 2015-12-25 ENCOUNTER — Encounter: Payer: Self-pay | Admitting: Family

## 2015-12-26 ENCOUNTER — Ambulatory Visit (HOSPITAL_COMMUNITY)
Admission: RE | Admit: 2015-12-26 | Discharge: 2015-12-26 | Disposition: A | Discharge status: Home / Self Care | Payer: PPO | Source: Ambulatory Visit | Attending: Family | Admitting: Family

## 2015-12-26 ENCOUNTER — Encounter: Payer: Self-pay | Admitting: Family

## 2015-12-26 ENCOUNTER — Ambulatory Visit (INDEPENDENT_AMBULATORY_CARE_PROVIDER_SITE_OTHER): Payer: PPO | Admitting: Family

## 2015-12-26 VITALS — BP 116/65 | HR 92 | Temp 97.0°F | Ht 67.0 in | Wt 219.5 lb

## 2015-12-26 DIAGNOSIS — F17200 Nicotine dependence, unspecified, uncomplicated: Secondary | ICD-10-CM

## 2015-12-26 DIAGNOSIS — Z95828 Presence of other vascular implants and grafts: Secondary | ICD-10-CM | POA: Insufficient documentation

## 2015-12-26 DIAGNOSIS — I714 Abdominal aortic aneurysm, without rupture, unspecified: Secondary | ICD-10-CM

## 2015-12-26 NOTE — Progress Notes (Signed)
[facility] [place]  CC: Follow up s/p EVAR  History of Present Illness  Blake Cherry is a 70 y.o. (Feb 14, 1946) male patient of Dr. 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 On 03/10/13 Dr. Darrick Penna performed angioplasty of left iliac limb of stent graft.   The patient denies new abdominal or back pain. The patient's atherosclerotic risk factors remain current smoking and hypertension. He denies any claudication symptoms. He has occasional numbness in his left leg since stroke in 2007.  Pt reports that he had a stroke in 2007 as manifested by contractions of left hand and non use of left leg, was treated with TPA. He has mild residual weakness and paresthesia in left leg and arm. States Dr. Pearlean Brownie placed a stent in his carotid artery in 2007. He denies ever having an incision in his neck for an endarterectomy.   He was diagnosed with COPD in early in 2016.  He takes 325 mg ASA daily. He is not on a statin. He denies any known cardiac problems.   Pt Diabetic: No Pt smoker: smoker  (3-4 cigarettes/day, decreased from 1.5 ppd, started in the 1960's)   Past Medical History:  Diagnosis Date  . AAA (abdominal aortic aneurysm) (HCC)   . Carotid artery occlusion 2007   Left cea  . COPD (chronic obstructive pulmonary disease) (HCC)   . Diverticulitis    last time 4 years ago  . Hypertension    per Dr Tenny Craw  . Shortness of breath    with exertion  . Stroke [facility] 05/2005    2 in 2007.  Weakness and numbness in left arm and leg   Past Surgical History:  Procedure Laterality Date  . ABDOMINAL AORTAGRAM N/A 03/10/2013   Procedure: ABDOMINAL Ronny Flurry;  Surgeon: Sherren Kerns, MD;  Location[facility] CATH LAB;  Service: Cardiovascular;  Laterality: N/A;  . ABDOMINAL AORTIC ENDOVASCULAR STENT GRAFT N/A 08/22/2012   Procedure: ABDOMINAL AORTIC ENDOVASCULAR STENT GRAFT;  Surgeon: Sherren Kerns, MD;  Location[facility]  OR;  Service: Vascular;  Laterality: N/A;  . CAROTID ENDARTERECTOMY Left 2007  . CAROTID STENT  2007   left internal carotid artery  . TESTICLE TORSION REDUCTION     age of 76   Social History Social History  Substance Use Topics  . Smoking status: Light Tobacco Smoker    Packs/day: 1.00    Years: 50.00    Types: Cigarettes  . Smokeless tobacco: Former Neurosurgeon    Types: Snuff  . Alcohol use No   Family History Family History  Problem Relation Age of Onset  . Cancer Mother     Breast cancer  . Hypertension Mother   . Diabetes Mother   . Diabetes Father    Current Outpatient Prescriptions on File Prior to Visit  Medication Sig Dispense Refill  . albuterol (PROVENTIL) (2.5 MG/3ML) 0.083% nebulizer solution USE ONE VIAL IN NEBULIZER EVERY 4 HOURS AS NEEDED FOR WHEEZING 50 vial 5  . aspirin 325 MG tablet Take 325 mg by mouth at bedtime.     . budesonide (PULMICORT) 0.5 MG/2ML nebulizer solution USE ONE VIAL IN NEBULIZER TWICE DAILY 120 mL 11  . budesonide-formoterol (SYMBICORT) 160-4.5 MCG/ACT inhaler Inhale 2 puffs into the lungs 2 (two) times daily. 3 Inhaler 3  . guaiFENesin (MUCINEX) 600 MG 12 hr tablet Take 1 tablet (600 mg total) by mouth 2 (two) times daily. 20 tablet 0  . lisinopril (PRINIVIL,ZESTRIL) 2.5  MG tablet Take 1 tablet (2.5 mg total) by mouth daily. 90 tablet 3  . mometasone (ELOCON) 0.1 % cream Apply to affected area bid prn 45 g 3  . tiotropium (SPIRIVA) 18 MCG inhalation capsule Place 1 capsule (18 mcg total) into inhaler and inhale daily. 90 capsule 3  . VENTOLIN HFA 108 (90 Base) MCG/ACT inhaler INHALE TWO PUFFS BY MOUTH EVERY 4 HOURS AS NEEDED FOR WHEEZING FOR SHORTNESS OF BREATH 18 each 5   No current facility-administered medications on file prior to visit.    No Known Allergies   ROS: See HPI for pertinent positives and negatives.  Physical Examination  Vitals:   12/26/15 0831  BP: 116/65  Pulse: 92  Temp: 97 F (36.1 C)  TempSrc: Oral  SpO2:  96%  Weight: 219 lb 8 oz (99.6 kg)  Height: 5\' 7"  (1.702 m)   Body mass index is 34.38 kg/m.  General: A&O x 3, WD, obese male.  Pulmonary: Sym exp, respirations are somewhat labored with putting on his socks and shoes, limited air movt in all fields, no rales or rhonchi, moist respirations, + expiratory wheezing, + pursed lips expirations when 30 degrees HOB elevated semi supine on exam table.  Cardiac: RRR, Nl S1, S2, no murmur appreciated  Vascular: Vessel Right Left  Radial 2+Palpable 2+Palpable  Carotid  without bruit  without bruit  Aorta Not palpable N/A  Femoral Not palpable  (obese) Not Palpable (obese)  Popliteal not palpable Not palpable  PT 2+Palpable 1+ Palpable  DP 2+Palpable 2+Palpable   Gastrointestinal: soft, NTND, -G/R, - HSM, - palpable masses, - CVAT B, large panus  Musculoskeletal: M/S 5/5 throughout except 4/5 in left LE, extremities without ischemic changes.  Neurologic: Pain and light touch intact in extremities, Motor exam as listed above   CTA Abd/Pelvis Duplex (Date: 09/14/13) Infrarenal aortic stent graft is patent. Limbs extend into the common iliac arteries bilaterally. The aneurysm sac now measures 4.3 x 4.1 cm on sequence 5, image 78 and previously measured 4.6 x 4.2 cm. No evidence for an endoleak.   03/16/2010 Carotid Duplex requested by Dr. Gerda DissLuking RIGHT CAROTID ARTERY: Minimal right carotid tortuosity.  Minimal plaque formation, calcified and noncalcified.  Scattered intimal thickening.  Spectral broadening right ICA on waveform analysis. No high velocity jets.  RiGHT VERTEBRAL ARTERY:  Patent, antegrade  LEFT CAROTID ARTERY: Intimal thickening throughout left CCA. Scattered atherosclerotic plaque, calcified and noncalcified. Turbulent flow on color Doppler imaging left ICA with spectral broadening on waveform analysis.  No high velocity jets.  LEFT VERTEBRAL ARTERY:  Patent, antegrade  ADDITIONAL FINDINGS:  None   IMPRESSION: Scattered intimal thickening and minor plaque formation. No evidence of hemodynamically significant stenosis.   Non-Invasive Vascular Imaging  EVAR Duplex (Date: 12/26/15)  AAA sac size: 4.25 cm. Unable to precisely measure sac due to bowel gas and body habitus. Comparison not appropriate due to limited serial exams. Unable to visualize bilateral common iliac arteries.   09/14/13: 4.3 cm x 4.1 cm   Medical Decision Making  Blake Cherry is a 70 y.o. 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  On 03/10/13 Dr. Darrick PennaFields performed angioplasty of left iliac limb of stent graft. Limited visualization of abdominal vasculature due to bowel gas and large panus. See above.  Unfortunately he continues to smoke but has decreased use. The patient was counseled re smoking cessation and given several free resources re smoking cessation.   I discussed with the patient the  importance of surveillance of the endograft.  The next endograft duplex will be scheduled for 12 months.  The patient will follow up with us in 12 months with these studies.  I emphasized the importance of maximal medical management including strict control of blood pressure, blood glucose, and lipid levels, antiplatelet agents, obtaining regular exercise, and cessation of smoking.   Thank you for allowing us to participate in this patient's care.  Charisse MarchSuzanne Nickel, RN, MSN, FNP-C [facility] [place] Office: [phone]  Clinic Physician: Darrick PennaFields  12/26/2015, 8:42 AM

## 2015-12-26 NOTE — Patient Instructions (Addendum)
Before your next abdominal ultrasound:  Take two Extra-Strength Gas-X capsules at bedtime the night before the test. Take another two Extra-Strength Gas-X capsules 3 hours before the test.      Steps to Quit Smoking  Smoking tobacco can be harmful to your health and can affect almost every organ in your body. Smoking puts you, and those around you, at risk for developing many serious chronic diseases. Quitting smoking is difficult, but it is one of the best things that you can do for your health. It is never too late to quit. WHAT ARE THE BENEFITS OF QUITTING SMOKING? When you quit smoking, you lower your risk of developing serious diseases and conditions, such as:  Lung cancer or lung disease, such as COPD.  Heart disease.  Stroke.  Heart attack.  Infertility.  Osteoporosis and bone fractures. Additionally, symptoms such as coughing, wheezing, and shortness of breath may get better when you quit. You may also find that you get sick less often because your body is stronger at fighting off colds and infections. If you are pregnant, quitting smoking can help to reduce your chances of having a baby of low birth weight. HOW DO I GET READY TO QUIT? When you decide to quit smoking, create a plan to make sure that you are successful. Before you quit:  Pick a date to quit. Set a date within the next two weeks to give you time to prepare.  Write down the reasons why you are quitting. Keep this list in places where you will see it often, such as on your bathroom mirror or in your car or wallet.  Identify the people, places, things, and activities that make you want to smoke (triggers) and avoid them. Make sure to take these actions:  Throw away all cigarettes at home, at work, and in your car.  Throw away smoking accessories, such as Set designerashtrays and lighters.  Clean your car and make sure to empty the ashtray.  Clean your home, including curtains and carpets.  Tell your family, friends,  and coworkers that you are quitting. Support from your loved ones can make quitting easier.  Talk with your health care provider about your options for quitting smoking.  Find out what treatment options are covered by your health insurance. WHAT STRATEGIES CAN I USE TO QUIT SMOKING?  Talk with your healthcare provider about different strategies to quit smoking. Some strategies include:  Quitting smoking altogether instead of gradually lessening how much you smoke over a period of time. Research shows that quitting "cold [place]key" is more successful than gradually quitting.  Attending in-person counseling to help you build problem-solving skills. You are more likely to have success in quitting if you attend several counseling sessions. Even short sessions of 10 minutes can be effective.  Finding resources and support systems that can help you to quit smoking and remain smoke-free after you quit. These resources are most helpful when you use them often. They can include:  Online chats with a Veterinary surgeoncounselor.  Telephone quitlines.  Printed Materials engineerself-help materials.  Support groups or group counseling.  Text messaging programs.  Mobile phone applications.  Taking medicines to help you quit smoking. (If you are pregnant or breastfeeding, talk with your health care provider first.) Some medicines contain nicotine and some do not. Both types of medicines help with cravings, but the medicines that include nicotine help to relieve withdrawal symptoms. Your health care provider may recommend:  Nicotine patches, gum, or lozenges.  Nicotine inhalers or sprays.  Non-nicotine medicine that is taken by mouth. Talk with your health care provider about combining strategies, such as taking medicines while you are also receiving in-person counseling. Using these two strategies together makes you more likely to succeed in quitting than if you used either strategy on its own. If you are pregnant or breastfeeding,  talk with your health care provider about finding counseling or other support strategies to quit smoking. Do not take medicine to help you quit smoking unless told to do so by your health care provider. WHAT THINGS CAN I DO TO MAKE IT EASIER TO QUIT? Quitting smoking might feel overwhelming at first, but there is a lot that you can do to make it easier. Take these important actions:  Reach out to your family and friends and ask that they support and encourage you during this time. Call telephone quitlines, reach out to support groups, or work with a counselor for support.  Ask people who smoke to avoid smoking around you.  Avoid places that trigger you to smoke, such as bars, parties, or smoke-break areas at work.  Spend time around people who do not smoke.  Lessen stress in your life, because stress can be a smoking trigger for some people. To lessen stress, try:  Exercising regularly.  Deep-breathing exercises.  Yoga.  Meditating.  Performing a body scan. This involves closing your eyes, scanning your body from head to toe, and noticing which parts of your body are particularly tense. Purposefully relax the muscles in those areas.  Download or purchase mobile phone or tablet apps (applications) that can help you stick to your quit plan by providing reminders, tips, and encouragement. There are many free apps, such as QuitGuide from the [employer] developer(Centers for Disease Control and Prevention). You can find other support for quitting smoking (smoking cessation) through smokefree.gov and other websites. HOW WILL I FEEL WHEN I QUIT SMOKING? Within the first 24 hours of quitting smoking, you may start to feel some withdrawal symptoms. These symptoms are usually most noticeable 2-3 days after quitting, but they usually do not last beyond 2-3 weeks. Changes or symptoms that you might experience include:  Mood swings.  Restlessness, anxiety, or irritation.  Difficulty  concentrating.  Dizziness.  Strong cravings for sugary foods in addition to nicotine.  Mild weight gain.  Constipation.  Nausea.  Coughing or a sore throat.  Changes in how your medicines work in your body.  A depressed mood.  Difficulty sleeping (insomnia). After the first 2-3 weeks of quitting, you may start to notice more positive results, such as:  Improved sense of smell and taste.  Decreased coughing and sore throat.  Slower heart rate.  Lower blood pressure.  Clearer skin.  The ability to breathe more easily.  Fewer sick days. Quitting smoking is very challenging for most people. Do not get discouraged if you are not successful the first time. Some people need to make many attempts to quit before they achieve long-term success. Do your best to stick to your quit plan, and talk with your health care provider if you have any questions or concerns.   This information is not intended to replace advice given to you by your health care provider. Make sure you discuss any questions you have with your health care provider.   Document Released: 02/03/2001 Document Revised: 06/26/2014 Document Reviewed: 06/26/2014 Elsevier Interactive Patient Education [employer]2016 Elsevier Inc.

## 2016-01-15 NOTE — Addendum Note (Signed)
Addended by: Burton ApleyPETTY, Liviah Cake A on: 01/15/2016 08:32 AM   Modules accepted: Orders

## 2016-02-05 ENCOUNTER — Other Ambulatory Visit: Payer: Self-pay | Admitting: *Deleted

## 2016-02-05 ENCOUNTER — Other Ambulatory Visit: Payer: Self-pay | Admitting: Family Medicine

## 2016-02-05 MED ORDER — BUDESONIDE-FORMOTEROL FUMARATE 160-4.5 MCG/ACT IN AERO: 2.0000 | INHALATION_SPRAY | Freq: Two times a day (BID) | RESPIRATORY_TRACT | 1 refills | Status: DC

## 2016-02-13 DIAGNOSIS — I10 Essential (primary) hypertension: Secondary | ICD-10-CM | POA: Diagnosis not present

## 2016-02-13 DIAGNOSIS — J441 Chronic obstructive pulmonary disease with (acute) exacerbation: Secondary | ICD-10-CM | POA: Diagnosis not present

## 2016-02-13 DIAGNOSIS — E669 Obesity, unspecified: Secondary | ICD-10-CM | POA: Diagnosis not present

## 2016-02-14 ENCOUNTER — Other Ambulatory Visit (HOSPITAL_COMMUNITY): Payer: Self-pay | Admitting: Pulmonary Disease

## 2016-02-14 ENCOUNTER — Ambulatory Visit (HOSPITAL_COMMUNITY)
Admission: RE | Admit: 2016-02-14 | Discharge: 2016-02-14 | Disposition: A | Discharge status: Home / Self Care | Payer: PPO | Source: Ambulatory Visit | Attending: Pulmonary Disease | Admitting: Pulmonary Disease

## 2016-02-14 DIAGNOSIS — R05 Cough: Secondary | ICD-10-CM

## 2016-02-14 DIAGNOSIS — R059 Cough, unspecified: Secondary | ICD-10-CM

## 2016-02-14 DIAGNOSIS — R0602 Shortness of breath: Secondary | ICD-10-CM | POA: Diagnosis not present

## 2016-03-20 ENCOUNTER — Other Ambulatory Visit: Payer: Self-pay | Admitting: Family Medicine

## 2016-04-06 ENCOUNTER — Telehealth: Payer: Self-pay | Admitting: Family Medicine

## 2016-04-06 NOTE — Telephone Encounter (Signed)
Patient's insurance ([employer]) is changing the formulary for his Ventolin inhaler.  They have changed it to Pro-Air inhaler.  He didn't know any other information.  [facility] [place] even though he isn't out, just in case it takes a while.  You can reach him at [phone].  Thanks

## 2016-04-08 MED ORDER — ALBUTEROL SULFATE HFA 108 (90 BASE) MCG/ACT IN AERS: 2.0000 | INHALATION_SPRAY | Freq: Four times a day (QID) | RESPIRATORY_TRACT | 11 refills | Status: DC | PRN

## 2016-04-08 NOTE — Telephone Encounter (Signed)
It would be fine to send in prescription for Pro Air inhaler specifically 2 puffs every 4 hours when necessary, #1, 12 refills-this inhaler has albuterol just like proventil

## 2016-04-08 NOTE — Telephone Encounter (Addendum)
Prescription sent electronically to pharmacy. Patient notified. 

## 2016-05-06 ENCOUNTER — Ambulatory Visit (INDEPENDENT_AMBULATORY_CARE_PROVIDER_SITE_OTHER): Payer: PPO | Admitting: Family Medicine

## 2016-05-06 ENCOUNTER — Encounter: Payer: Self-pay | Admitting: Family Medicine

## 2016-05-06 VITALS — BP 118/64 | Ht 67.0 in | Wt 220.5 lb

## 2016-05-06 DIAGNOSIS — I10 Essential (primary) hypertension: Secondary | ICD-10-CM | POA: Diagnosis not present

## 2016-05-06 DIAGNOSIS — J438 Other emphysema: Secondary | ICD-10-CM

## 2016-05-06 MED ORDER — LISINOPRIL 2.5 MG PO TABS: 2.5000 mg | ORAL_TABLET | Freq: Every day | ORAL | 3 refills | Status: DC

## 2016-05-06 MED ORDER — BUDESONIDE-FORMOTEROL FUMARATE 160-4.5 MCG/ACT IN AERO: 2.0000 | INHALATION_SPRAY | Freq: Two times a day (BID) | RESPIRATORY_TRACT | 3 refills | Status: DC

## 2016-05-06 NOTE — Progress Notes (Signed)
   Subjective:    Patient ID: Blake Cherry, male    DOB: 01/30/1946, 71 y.o.   MRN: [id number]  Hypertension  This is a chronic problem. The current episode started more than 1 year ago. There are no compliance problems.    Patient has history of COPD followed with Dr. Juanetta GoslingHawkins on a regular basis. Patient uses inhalers on a regular basis has had several flareups in the past year. He has a history of strokes has not had any stroke symptoms recently Aches his blood pressure medicine regular basis he still smokes a few cigarettes a day he knows he needs to quit he does try to stay physically active but his breathing hampers him he denies any, dictation issues. Patient states no concerns this visit.   Review of Systems    denies chest tightness pressure pain has mild shortness breath swelling does relate some shortness of breath with his COPD Objective:   Physical Exam Neck no masses lungs clear no crackles heart regular pulse normal BP good extremities no edema       Assessment & Plan:  HTN good control continue current measures Renal insufficiency patient will do a follow-up metabolic 7 Patient do comprehensive lab work and a wellness exam in the fall Follow-up with Dr. Juanetta GoslingHawkins for COPD I did discuss lung cancer screening with the patient he states he will discuss it further with Dr. Juanetta GoslingHawkins

## 2016-05-09 DIAGNOSIS — I10 Essential (primary) hypertension: Secondary | ICD-10-CM | POA: Diagnosis not present

## 2016-05-10 LAB — BASIC METABOLIC PANEL
BUN/Creatinine Ratio: 12 (ref 10–24)
BUN: 19 mg/dL (ref 8–27)
CO2: 24 mmol/L (ref 18–29)
Calcium: 9.9 mg/dL (ref 8.6–10.2)
Chloride: 99 mmol/L (ref 96–106)
Creatinine, Ser: 1.63 mg/dL — ABNORMAL HIGH (ref 0.76–1.27)
GFR calc Af Amer: 49 mL/min/{1.73_m2} — ABNORMAL LOW (ref >59)
GFR calc non Af Amer: 42 mL/min/{1.73_m2} — ABNORMAL LOW (ref >59)
Glucose: 107 mg/dL — ABNORMAL HIGH (ref 65–99)
Potassium: 5.2 mmol/L (ref 3.5–5.2)
Sodium: 140 mmol/L (ref 134–144)

## 2016-05-14 NOTE — Addendum Note (Signed)
Addended by: Jeralene PetersREWS, Axelle Szwed R on: 05/14/2016 08:52 AM   Modules accepted: Orders

## 2016-06-06 ENCOUNTER — Other Ambulatory Visit: Payer: Self-pay | Admitting: Family Medicine

## 2016-06-17 DIAGNOSIS — F17200 Nicotine dependence, unspecified, uncomplicated: Secondary | ICD-10-CM | POA: Diagnosis not present

## 2016-06-17 DIAGNOSIS — J309 Allergic rhinitis, unspecified: Secondary | ICD-10-CM | POA: Diagnosis not present

## 2016-06-17 DIAGNOSIS — J449 Chronic obstructive pulmonary disease, unspecified: Secondary | ICD-10-CM | POA: Diagnosis not present

## 2016-06-17 DIAGNOSIS — I10 Essential (primary) hypertension: Secondary | ICD-10-CM | POA: Diagnosis not present

## 2016-06-18 ENCOUNTER — Other Ambulatory Visit: Payer: Self-pay

## 2016-06-18 ENCOUNTER — Telehealth: Payer: Self-pay | Admitting: Family Medicine

## 2016-06-18 MED ORDER — TIOTROPIUM BROMIDE MONOHYDRATE 18 MCG IN CAPS: 18.0000 ug | ORAL_CAPSULE | Freq: Every day | RESPIRATORY_TRACT | 0 refills | Status: DC

## 2016-06-18 NOTE — Telephone Encounter (Signed)
Spoke with patient and informed him that medication sent into pharmacy. Patient verbalized understanding.

## 2016-06-18 NOTE — Telephone Encounter (Signed)
Pt is requesting a prescription for spiriva a 90 day supply. Please advise.    [facility]

## 2016-08-22 ENCOUNTER — Other Ambulatory Visit: Payer: Self-pay | Admitting: Family Medicine

## 2016-08-24 ENCOUNTER — Other Ambulatory Visit: Payer: Self-pay | Admitting: *Deleted

## 2016-08-24 MED ORDER — ALBUTEROL SULFATE (2.5 MG/3ML) 0.083% IN NEBU: INHALATION_SOLUTION | RESPIRATORY_TRACT | 2 refills | Status: DC

## 2016-10-06 ENCOUNTER — Other Ambulatory Visit: Payer: Self-pay | Admitting: Family Medicine

## 2016-10-22 ENCOUNTER — Other Ambulatory Visit: Payer: Self-pay | Admitting: Nurse Practitioner

## 2016-10-22 ENCOUNTER — Other Ambulatory Visit: Payer: Self-pay | Admitting: Family Medicine

## 2016-11-05 ENCOUNTER — Ambulatory Visit (INDEPENDENT_AMBULATORY_CARE_PROVIDER_SITE_OTHER): Payer: PPO | Admitting: Family Medicine

## 2016-11-05 ENCOUNTER — Encounter: Payer: Self-pay | Admitting: Family Medicine

## 2016-11-05 VITALS — BP 118/68 | Ht 67.0 in | Wt 202.0 lb

## 2016-11-05 DIAGNOSIS — Z125 Encounter for screening for malignant neoplasm of prostate: Secondary | ICD-10-CM

## 2016-11-05 DIAGNOSIS — Z79899 Other long term (current) drug therapy: Secondary | ICD-10-CM

## 2016-11-05 DIAGNOSIS — I10 Essential (primary) hypertension: Secondary | ICD-10-CM | POA: Diagnosis not present

## 2016-11-05 DIAGNOSIS — E7849 Other hyperlipidemia: Secondary | ICD-10-CM

## 2016-11-05 DIAGNOSIS — E784 Other hyperlipidemia: Secondary | ICD-10-CM | POA: Diagnosis not present

## 2016-11-05 DIAGNOSIS — J438 Other emphysema: Secondary | ICD-10-CM | POA: Diagnosis not present

## 2016-11-05 DIAGNOSIS — F17200 Nicotine dependence, unspecified, uncomplicated: Secondary | ICD-10-CM

## 2016-11-05 DIAGNOSIS — N182 Chronic kidney disease, stage 2 (mild): Secondary | ICD-10-CM | POA: Diagnosis not present

## 2016-11-05 DIAGNOSIS — Z23 Encounter for immunization: Secondary | ICD-10-CM | POA: Diagnosis not present

## 2016-11-05 DIAGNOSIS — Z1211 Encounter for screening for malignant neoplasm of colon: Secondary | ICD-10-CM

## 2016-11-05 NOTE — Progress Notes (Signed)
   Subjective:    Patient ID: Blake Cherry, male    DOB: 12/07/1945, 71 y.o.   MRN: [id number]  Hypertension  This is a chronic problem. Associated symptoms include shortness of breath. Pertinent negatives include no chest pain or headaches. Compliance problems include exercise (eats healthy, has lost some weight. ).    Pt states no concerns today.  The patient does have high blood pressure takes medicine keeps it under control He has COPD he still smokes he knows he needs to quit-she does not smoke much He does have chronic kidney disease he avoids overeating. He is due for lab work Has hyperlipidemia controlled with diet He needs screening for prostate cancer but he does not one rectally exam He needs screening for colonoscopy/colon cancer but does not want colonoscopy therefore we will do stool testing Patient is a smoker long discussion held face-to-face regarding risk and benefits of low-dose CAT scan he would like to go forward with this   Flu vaccine today.    Review of Systems  Constitutional: Negative for activity change, fatigue and fever.  HENT: Negative for congestion and rhinorrhea.   Respiratory: Positive for cough and shortness of breath.   Cardiovascular: Negative for chest pain and leg swelling.  Gastrointestinal: Negative for abdominal pain, diarrhea and nausea.  Neurological: Negative for headaches.       Objective:   Physical Exam  Constitutional: He appears well-nourished. No distress.  Cardiovascular: Normal rate, regular rhythm and normal heart sounds.   No murmur heard. Pulmonary/Chest: Effort normal and breath sounds normal. No respiratory distress.  Musculoskeletal: He exhibits no edema.  Lymphadenopathy:    He has no cervical adenopathy.  Neurological: He is alert.  Psychiatric: His behavior is normal.  Vitals reviewed.   25 minutes was spent with the patient. Greater than half the time was spent in discussion and answering questions and  counseling regarding the issues that the patient came in for today.       Assessment & Plan:  HTN- Patient was seen today as part of a visit regarding hypertension. The importance of healthy diet and regular physical activity was discussed. The importance of compliance with medications discussed. Ideal goal is to keep blood pressure low elevated levels certainly below 140/90 when possible. The patient was counseled that keeping blood pressure under control lessen his risk of heart attack, stroke, kidney failure, and early death. The importance of regular follow-ups was discussed with the patient. Low-salt diet such as DASH recommended. Regular physical activity was recommended as well. Patient was advised to keep regular follow-ups.  COPD continue medications he uses Symbicort and Spiriva during the times year where it bothers him more mainly because of the cost of the medicine he does do the inhaled steroid nebulizer as well as albuterol  He knows he needs to quit smoking he is trying but does not want any aids  Lipid he knows to watch diet stay active and try to bring his weight down  Screening for prostate cancer Screening for colon cancer stool test Low-dose CT scan for screening for lung cancer discuss with the patient he is interested we will make the referral Patient to follow-up 6 months follow-up sooner if any problems

## 2016-11-06 ENCOUNTER — Ambulatory Visit: Payer: PPO | Admitting: Family Medicine

## 2016-11-06 ENCOUNTER — Encounter: Payer: Self-pay | Admitting: Family Medicine

## 2016-11-19 DIAGNOSIS — E784 Other hyperlipidemia: Secondary | ICD-10-CM | POA: Diagnosis not present

## 2016-11-19 DIAGNOSIS — J438 Other emphysema: Secondary | ICD-10-CM | POA: Diagnosis not present

## 2016-11-19 DIAGNOSIS — Z79899 Other long term (current) drug therapy: Secondary | ICD-10-CM | POA: Diagnosis not present

## 2016-11-19 DIAGNOSIS — Z23 Encounter for immunization: Secondary | ICD-10-CM | POA: Diagnosis not present

## 2016-11-19 DIAGNOSIS — N182 Chronic kidney disease, stage 2 (mild): Secondary | ICD-10-CM | POA: Diagnosis not present

## 2016-11-19 DIAGNOSIS — Z125 Encounter for screening for malignant neoplasm of prostate: Secondary | ICD-10-CM | POA: Diagnosis not present

## 2016-11-19 DIAGNOSIS — I10 Essential (primary) hypertension: Secondary | ICD-10-CM | POA: Diagnosis not present

## 2016-11-19 DIAGNOSIS — Z1211 Encounter for screening for malignant neoplasm of colon: Secondary | ICD-10-CM | POA: Diagnosis not present

## 2016-11-20 ENCOUNTER — Other Ambulatory Visit: Payer: Self-pay

## 2016-11-20 DIAGNOSIS — Z1211 Encounter for screening for malignant neoplasm of colon: Secondary | ICD-10-CM

## 2016-11-20 LAB — BASIC METABOLIC PANEL
BUN/Creatinine Ratio: 13 (ref 10–24)
BUN: 20 mg/dL (ref 8–27)
CO2: 21 mmol/L (ref 20–29)
Calcium: 9.8 mg/dL (ref 8.6–10.2)
Chloride: 104 mmol/L (ref 96–106)
Creatinine, Ser: 1.58 mg/dL — ABNORMAL HIGH (ref 0.76–1.27)
GFR calc Af Amer: 50 mL/min/{1.73_m2} — ABNORMAL LOW (ref >59)
GFR calc non Af Amer: 44 mL/min/{1.73_m2} — ABNORMAL LOW (ref >59)
Glucose: 102 mg/dL — ABNORMAL HIGH (ref 65–99)
Potassium: 5.1 mmol/L (ref 3.5–5.2)
Sodium: 140 mmol/L (ref 134–144)

## 2016-11-20 LAB — HEPATIC FUNCTION PANEL
ALT: 10 IU/L (ref 0–44)
AST: 17 IU/L (ref 0–40)
Albumin: 4.2 g/dL (ref 3.5–4.8)
Alkaline Phosphatase: 89 IU/L (ref 39–117)
Bilirubin Total: 0.4 mg/dL (ref 0.0–1.2)
Bilirubin, Direct: 0.13 mg/dL (ref 0.00–0.40)
Total Protein: 7 g/dL (ref 6.0–8.5)

## 2016-11-20 LAB — CBC WITH DIFFERENTIAL/PLATELET
Basophils Absolute: 0.1 10*3/uL (ref 0.0–0.2)
Basos: 1 %
EOS (ABSOLUTE): 0.9 10*3/uL — ABNORMAL HIGH (ref 0.0–0.4)
Eos: 7 %
Hematocrit: 47.4 % (ref 37.5–51.0)
Hemoglobin: 16.1 g/dL (ref 13.0–17.7)
Immature Grans (Abs): 0 10*3/uL (ref 0.0–0.1)
Immature Granulocytes: 0 %
Lymphocytes Absolute: 2 10*3/uL (ref 0.7–3.1)
Lymphs: 15 %
MCH: 32.6 pg (ref 26.6–33.0)
MCHC: 34 g/dL (ref 31.5–35.7)
MCV: 96 fL (ref 79–97)
Monocytes Absolute: 1 10*3/uL — ABNORMAL HIGH (ref 0.1–0.9)
Monocytes: 7 %
Neutrophils Absolute: 9.7 10*3/uL — ABNORMAL HIGH (ref 1.4–7.0)
Neutrophils: 70 %
Platelets: 317 10*3/uL (ref 150–379)
RBC: 4.94 x10E6/uL (ref 4.14–5.80)
RDW: 13.9 % (ref 12.3–15.4)
WBC: 13.7 10*3/uL — ABNORMAL HIGH (ref 3.4–10.8)

## 2016-11-20 LAB — LIPID PANEL
Chol/HDL Ratio: 3.4 ratio (ref 0.0–5.0)
Cholesterol, Total: 147 mg/dL (ref 100–199)
HDL: 43 mg/dL (ref >39)
LDL Calculated: 86 mg/dL (ref 0–99)
Triglycerides: 91 mg/dL (ref 0–149)
VLDL Cholesterol Cal: 18 mg/dL (ref 5–40)

## 2016-11-20 LAB — PSA: Prostate Specific Ag, Serum: 1.6 ng/mL (ref 0.0–4.0)

## 2016-11-20 LAB — IFOBT (OCCULT BLOOD): IFOBT: NEGATIVE

## 2016-11-24 ENCOUNTER — Other Ambulatory Visit: Payer: Self-pay | Admitting: Acute Care

## 2016-11-24 ENCOUNTER — Encounter: Payer: Self-pay | Admitting: Family Medicine

## 2016-11-24 DIAGNOSIS — D72829 Elevated white blood cell count, unspecified: Secondary | ICD-10-CM | POA: Insufficient documentation

## 2016-11-24 DIAGNOSIS — F17210 Nicotine dependence, cigarettes, uncomplicated: Secondary | ICD-10-CM

## 2016-11-24 DIAGNOSIS — Z122 Encounter for screening for malignant neoplasm of respiratory organs: Secondary | ICD-10-CM

## 2016-11-25 ENCOUNTER — Other Ambulatory Visit: Payer: Self-pay | Admitting: Family Medicine

## 2016-12-09 ENCOUNTER — Ambulatory Visit (INDEPENDENT_AMBULATORY_CARE_PROVIDER_SITE_OTHER): Payer: PPO | Admitting: Acute Care

## 2016-12-09 ENCOUNTER — Encounter: Payer: Self-pay | Admitting: Acute Care

## 2016-12-09 ENCOUNTER — Ambulatory Visit (INDEPENDENT_AMBULATORY_CARE_PROVIDER_SITE_OTHER)
Admission: RE | Admit: 2016-12-09 | Discharge: 2016-12-09 | Disposition: A | Discharge status: Home / Self Care | Payer: PPO | Source: Ambulatory Visit | Attending: Acute Care | Admitting: Acute Care

## 2016-12-09 DIAGNOSIS — F17210 Nicotine dependence, cigarettes, uncomplicated: Secondary | ICD-10-CM

## 2016-12-09 DIAGNOSIS — Z87891 Personal history of nicotine dependence: Secondary | ICD-10-CM

## 2016-12-09 DIAGNOSIS — Z122 Encounter for screening for malignant neoplasm of respiratory organs: Secondary | ICD-10-CM

## 2016-12-09 NOTE — Progress Notes (Signed)
Shared Decision Making Visit Lung Cancer Screening Program [phone])   Eligibility:  Age 71 y.o.  Pack Years Smoking History Calculation 58-pack-year smoking history (# packs/per year x # years smoked)  Recent History of coughing up blood  no  Unexplained weight loss? no ( >Than 15 pounds within the last 6 months )  Prior History Lung / other cancer no (Diagnosis within the last 5 years already requiring surveillance chest CT Scans).  Smoking Status Current Smoker  Former Smokers: Years since quit: NA  Quit Date: NA  Visit Components:  Discussion included one or more decision making aids. yes  Discussion included risk/benefits of screening. yes  Discussion included potential follow up diagnostic testing for abnormal scans. yes  Discussion included meaning and risk of over diagnosis. yes  Discussion included meaning and risk of False Positives. yes  Discussion included meaning of total radiation exposure. yes  Counseling Included:  Importance of adherence to annual lung cancer LDCT screening. yes  Impact of comorbidities on ability to participate in the program. yes  Ability and willingness to under diagnostic treatment. yes  Smoking Cessation Counseling:  Current Smokers:   Discussed importance of smoking cessation. yes  Information about tobacco cessation classes and interventions provided to patient. yes  Patient provided with "ticket" for LDCT Scan. yes  Symptomatic Patient. no  Counseling: NA  Diagnosis Code: Tobacco Use Z72.0  Asymptomatic Patient yes  Counseling (Intermediate counseling: > three minutes counseling) U0454  Former Smokers:   Discussed the importance of maintaining cigarette abstinence. yes  Diagnosis Code: Personal History of Nicotine Dependence. U98.119  Information about tobacco cessation classes and interventions provided to patient. Yes  Patient provided with "ticket" for LDCT Scan. yes  Written Order for Lung Cancer  Screening with LDCT placed in Epic. Yes (CT Chest Lung Cancer Screening Low Dose W/O CM) [id number] Z12.2-Screening of respiratory organs Z87.891-Personal history of nicotine dependence  I have spent 25 minutes of face to face time with Blake Cherry discussing the risks and benefits of lung cancer screening. We viewed a power point together that explained in detail the above noted topics. We paused at intervals to allow for questions to be asked and answered to ensure understanding.We discussed that the single most powerful action that he can take to decrease his risk of developing lung cancer is to quit smoking. We discussed whether or not he is ready to commit to setting a quit date. He is currently not ready to set a quit date. We discussed options for tools to aid in quitting smoking including nicotine replacement therapy, non-nicotine medications, support groups, Quit Smart classes, and behavior modification. We discussed that often times setting smaller, more achievable goals, such as eliminating 1 cigarette a day for a week and then 2 cigarettes a day for a week can be helpful in slowly decreasing the number of cigarettes smoked. This allows for a sense of accomplishment as well as providing a clinical benefit. I gave Blake Cherry the " Be Stronger Than Your Excuses" card with contact information for community resources, classes, free nicotine replacement therapy, and access to mobile apps, text messaging, and on-line smoking cessation help. I have also given him my card and contact information in the event he needs to contact me. We discussed the time and location of the scan, and that either [name] RN or I will call with the results within 24-48 hours of receiving them. I have offered him  a copy of the power point we viewed  as a resource in the event they need reinforcement of the concepts we discussed today in the office. The patient verbalized understanding of all of  the above and had no further  questions upon leaving the office. They have my contact information in the event they have any further questions.  I spent 4 minutes counseling on smoking cessation and the health risks of continued tobacco abuse.  I explained to the patient that there has been a high incidence of coronary artery disease noted on these exams. I explained that this is a non-gated exam therefore degree or severity cannot be determined. This patient is currently not on statin therapy. I have asked the patient to follow-up with their PCP regarding any incidental finding of coronary artery disease and management with diet or medication as their PCP  feels is clinically indicated. The patient verbalized understanding of the above and had no further questions upon completion of the visit.      Bevelyn NgoSarah F Shene Maxfield, NP 12/09/2016

## 2016-12-11 ENCOUNTER — Other Ambulatory Visit: Payer: Self-pay | Admitting: Acute Care

## 2016-12-11 ENCOUNTER — Telehealth: Payer: Self-pay | Admitting: Acute Care

## 2016-12-11 DIAGNOSIS — R9389 Abnormal findings on diagnostic imaging of other specified body structures: Secondary | ICD-10-CM

## 2016-12-11 DIAGNOSIS — R911 Solitary pulmonary nodule: Secondary | ICD-10-CM

## 2016-12-11 NOTE — Telephone Encounter (Signed)
I have called Blake Cherry with his low-dose screening CT results. I explained that his scan was read as a Lung RADS 4 A : suspicious findings, either short term follow up in 3 months or alternatively  PET Scan evaluation may be considered when there is a solid component of  8 mm or larger. I explained that the radiologist commented that it looked like he could possibly be infectious or inflammatory. Per the patient he has been on antibiotics for the last week with prednisone for bronchitis. I explained that we will repeat the scan in 3 months to ensure that this enlarged nodule has returned to normal size. He verbalized understanding, and had no further questions. He knows to await a call regarding schedule of repeat scan in 3 months.

## 2016-12-14 ENCOUNTER — Telehealth: Payer: Self-pay | Admitting: Family Medicine

## 2016-12-14 NOTE — Telephone Encounter (Signed)
Results faxed to PCP.  Order was placed for 3 mth f/u CT.  Pt placed on reminder list.

## 2016-12-14 NOTE — Telephone Encounter (Signed)
I have seen and what the CAT scan showed in that this is being managed with a follow-up CT scan in 3 months that the patient was informed by Blake Cherry

## 2016-12-17 DIAGNOSIS — I10 Essential (primary) hypertension: Secondary | ICD-10-CM | POA: Diagnosis not present

## 2016-12-17 DIAGNOSIS — F17200 Nicotine dependence, unspecified, uncomplicated: Secondary | ICD-10-CM | POA: Diagnosis not present

## 2016-12-17 DIAGNOSIS — E669 Obesity, unspecified: Secondary | ICD-10-CM | POA: Diagnosis not present

## 2016-12-17 DIAGNOSIS — J449 Chronic obstructive pulmonary disease, unspecified: Secondary | ICD-10-CM | POA: Diagnosis not present

## 2016-12-28 ENCOUNTER — Other Ambulatory Visit: Payer: Self-pay | Admitting: Family Medicine

## 2016-12-31 ENCOUNTER — Encounter: Payer: Self-pay | Admitting: Family

## 2016-12-31 ENCOUNTER — Ambulatory Visit (INDEPENDENT_AMBULATORY_CARE_PROVIDER_SITE_OTHER): Payer: PPO | Admitting: Family

## 2016-12-31 ENCOUNTER — Ambulatory Visit (HOSPITAL_COMMUNITY)
Admission: RE | Admit: 2016-12-31 | Discharge: 2016-12-31 | Disposition: A | Discharge status: Home / Self Care | Payer: PPO | Source: Ambulatory Visit | Attending: Vascular Surgery | Admitting: Vascular Surgery

## 2016-12-31 VITALS — BP 123/64 | HR 88 | Temp 97.4°F | Resp 20 | Ht 67.0 in | Wt 211.0 lb

## 2016-12-31 DIAGNOSIS — R140 Abdominal distension (gaseous): Secondary | ICD-10-CM | POA: Diagnosis not present

## 2016-12-31 DIAGNOSIS — I714 Abdominal aortic aneurysm, without rupture, unspecified: Secondary | ICD-10-CM

## 2016-12-31 DIAGNOSIS — F17200 Nicotine dependence, unspecified, uncomplicated: Secondary | ICD-10-CM

## 2016-12-31 DIAGNOSIS — Z95828 Presence of other vascular implants and grafts: Secondary | ICD-10-CM

## 2016-12-31 NOTE — Progress Notes (Signed)
[facility] [place]  CC: Follow up s/p Endovascular Repair of Abdominal Aortic Aneurysm    History of Present Illness  Blake Cherry is a 71 y.o. ([birth year]) male ho presents for follow-up evaluation s/p Gore Excluder aneurysm stent graft repair June 2014 by Dr. Darrick Penna. On 03/10/13 Dr. Darrick Penna performed angioplasty of left iliac limb of stent graft.  The patient denies new abdominal or back pain. The patient's atherosclerotic risk factors remain current smoking and hypertension. He denies any claudication symptoms. He has occasional numbness in his left leg since stroke in 2007.  Pt reports that he had a stroke in 2007 as manifested by contractions of left hand and non use of left leg, was treated with TPA. He has mild residual weakness and paresthesia in left leg and arm. States Dr. Pearlean Brownie placed a stent in his carotid artery in 2007. He denies ever having an incision in his neck for an endarterectomy.   Pt states the swelling in his lower legs mostly resolves by morning with elevation.   He was diagnosed with COPD in early in 2016.  He takes 325 mg ASA daily. He is not on a statin. He denies any known cardiac problems.   Pt Diabetic: No Pt smoker: smoker (5-6 cigarettes/day, decreased from 1.5 ppd, started in the 1960's)   Past Medical History:  Diagnosis Date  . AAA (abdominal aortic aneurysm) (HCC)   . Carotid artery occlusion 2007   Left cea  . COPD (chronic obstructive pulmonary disease) (HCC)   . Diverticulitis    last time 4 years ago  . Hypertension    per Dr Tenny Craw  . Shortness of breath    with exertion  . Stroke [facility]) 05/2005    2 in 2007.  Weakness and numbness in left arm and leg   Past Surgical History:  Procedure Laterality Date  . CAROTID ENDARTERECTOMY Left 2007  . CAROTID STENT  2007   left internal carotid artery  . TESTICLE TORSION REDUCTION     age of 63   Social History Social History   Tobacco Use  . Smoking  status: Current Every Day Smoker    Packs/day: 1.00    Years: 50.00    Pack years: 50.00    Types: Cigarettes  . Smokeless tobacco: Former Neurosurgeon    Types: Snuff  Substance Use Topics  . Alcohol use: No    Alcohol/week: 0.0 oz  . Drug use: No   Family History Family History  Problem Relation Age of Onset  . Cancer Mother        Breast cancer  . Hypertension Mother   . Diabetes Mother   . Diabetes Father    Current Outpatient Medications on File Prior to Visit  Medication Sig Dispense Refill  . albuterol (PROAIR HFA) 108 (90 Base) MCG/ACT inhaler Inhale 2 puffs into the lungs every 6 (six) hours as needed for wheezing or shortness of breath. 1 Inhaler 11  . albuterol (PROVENTIL) (2.5 MG/3ML) 0.083% nebulizer solution USE 1 VIAL IN NEBULIZER EVERY 4 HOURS AS NEEDED FOR WHEEZING 450 mL 0  . aspirin 325 MG tablet Take 325 mg by mouth at bedtime.     . budesonide (PULMICORT) 0.5 MG/2ML nebulizer solution USE 1 VIAL IN NEBULIZER TWICE DAILY 120 mL 0  . budesonide-formoterol (SYMBICORT) 160-4.5 MCG/ACT inhaler Inhale 2 puffs into the lungs 2 (two) times daily. 3 Inhaler 3  . doxycycline (VIBRAMYCIN) 100 MG capsule     . guaiFENesin (MUCINEX)  600 MG 12 hr tablet Take 1 tablet (600 mg total) by mouth 2 (two) times daily. 20 tablet 0  . lisinopril (PRINIVIL,ZESTRIL) 2.5 MG tablet Take 1 tablet (2.5 mg total) by mouth daily. 90 tablet 3  . Multiple Vitamins-Minerals (OCUVITE PRESERVISION PO) Take by mouth.    . predniSONE (DELTASONE) 10 MG tablet     . tiotropium (SPIRIVA) 18 MCG inhalation capsule Place 1 capsule (18 mcg total) into inhaler and inhale daily. 90 capsule 0   No current facility-administered medications on file prior to visit.    No Known Allergies   ROS: See HPI for pertinent positives and negatives.  Physical Examination  Vitals:   12/31/16 0827  BP: 123/64  Pulse: 88  Resp: 20  Temp: (!) 97.4 F (36.3 C)  TempSrc: Oral  SpO2: 94%  Weight: 211 lb (95.7 kg)   Height: 5\' 7"  (1.702 m)   Body mass index is 33.05 kg/m.  General: A&O x 3, WD, obese male.  Pulmonary: Sym exp, respirations are somewhat labored with putting on his socks and shoes, limited air movt in all fields, no rales or rhonchi, + pursed lips expirations when 30 degrees HOB elevated semi supine on exam table.  Cardiac: RRR, Nl S1, S2, no murmur appreciated  Vascular: Vessel Right Left  Radial 1+Palpable 1+Palpable  Carotid without bruit without bruit  Aorta Not palpable N/A  Femoral Not palpable  (obese) Not Palpable (obese)  Popliteal not palpable Not palpable  PT 2+Palpable 1+ Palpable  DP 2+Palpable 2+Palpable   Gastrointestinal: soft, NTND, -G/R, - HSM, - palpable masses, - CVAT B, large panus  Musculoskeletal: M/S 5/5 throughout except 4/5 in left LE, extremities without ischemic changes. 1-2+ pitting pretibial/ankle/feet edema.   Neurologic: Pain and light touch intact in extremities, Motor exam as listed above   DATA  EVAR Duplex (Date: 12/31/16):  AAA sac size: 4.4 cm; Right CIA: 1.3 cm; Left CIA: 1.2 cm  Technically difficult exam due to body habitus and bowel gas; unable to thoroughly evaluate iliac arteries.   Previous sac size (12-26-15): 4.25 cm  CTA Abd/Pelvis Duplex (Date: 09/14/13): Infrarenal aortic stent graft is patent. Limbs extend into the common iliac arteries bilaterally. The aneurysm sac now measures 4.3 x 4.1 cm on sequence 5, image 78 and previously measured 4.6 x 4.2 cm. No evidence for an endoleak.   03/16/2010 Carotid Duplex requested by Dr. Gerda DissLuking: RIGHT CAROTID ARTERY: Minimal right carotid tortuosity. Minimal plaque formation, calcified and noncalcified. Scattered intimal thickening. Spectral broadening right ICA on waveform analysis. No high velocity jets. RiGHT VERTEBRAL ARTERY: Patent, antegrade LEFT CAROTID ARTERY: Intimal thickening throughout left CCA. Scattered atherosclerotic plaque, calcified and  noncalcified. Turbulent flow on color Doppler imaging left ICA with spectral broadening on waveform analysis. No high velocity jets. LEFT VERTEBRAL ARTERY: Patent, antegrade ADDITIONAL FINDINGS: None IMPRESSION: Scattered intimal thickening and minor plaque formation. No evidence of hemodynamically significant stenosis.    Medical Decision Making  Blake Cherry is a 71 y.o. 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  On 03/10/13 Dr. Darrick PennaFields performed angioplasty of left iliac limb of stent graft. Limited visualization of abdominal vasculature due to bowel gas and large panus. See above.  Unfortunately he continues to smoke. The patient was counseled re smoking cessation and given several free resources re smoking cessation.  I discussed with the patient the importance of surveillance of the endograft.  The next endograft duplex will be scheduled for 12 months.  The patient will follow up with us in 12 months with these studies.  I emphasized the importance of maximal medical management including strict control of blood pressure, blood glucose, and lipid levels, antiplatelet agents, obtaining regular exercise, and cessation of smoking.   Thank you for allowing us to participate in this patient's care.  Charisse MarchSuzanne Burnell Hurta, RN, MSN, FNP-C [facility] [place] Office: [phone]  Clinic Physician: Darrick PennaFields  12/31/2016, 8:56 AM

## 2016-12-31 NOTE — Patient Instructions (Addendum)
To measure for knee high compression hose: Measure the length of calf (from the crease of the knee to the bottom of the heel), largest circumference of calf, and ankle circumference first thing in the morning before your legs have a chance to swell.  Take these 3 measurements with you to obtain 20-30 mm mercury graduated knee high compression hose.  Put the stockings on in the morning, remove at bedtime.     Before your next abdominal ultrasound:  Take two Extra-Strength Gas-X capsules at bedtime the night before the test. Take another two Extra-Strength Gas-X capsules 3 hours before the test.  Avoid gas forming foods the day before the test.      Steps to Quit Smoking Smoking tobacco can be bad for your health. It can also affect almost every organ in your body. Smoking puts you and people around you at risk for many serious long-lasting (chronic) diseases. Quitting smoking is hard, but it is one of the best things that you can do for your health. It is never too late to quit. What are the benefits of quitting smoking? When you quit smoking, you lower your risk for getting serious diseases and conditions. They can include:  Lung cancer or lung disease.  Heart disease.  Stroke.  Heart attack.  Not being able to have children (infertility).  Weak bones (osteoporosis) and broken bones (fractures).  If you have coughing, wheezing, and shortness of breath, those symptoms may get better when you quit. You may also get sick less often. If you are pregnant, quitting smoking can help to lower your chances of having a baby of low birth weight. What can I do to help me quit smoking? Talk with your doctor about what can help you quit smoking. Some things you can do (strategies) include:  Quitting smoking totally, instead of slowly cutting back how much you smoke over a period of time.  Going to in-person counseling. You are more likely to quit if you go to many counseling  sessions.  Using resources and support systems, such as: ? Agricultural engineernline chats with a Veterinary surgeoncounselor. ? Phone quitlines. ? Automotive engineerrinted self-help materials. ? Support groups or group counseling. ? Text messaging programs. ? Mobile phone apps or applications.  Taking medicines. Some of these medicines may have nicotine in them. If you are pregnant or breastfeeding, do not take any medicines to quit smoking unless your doctor says it is okay. Talk with your doctor about counseling or other things that can help you.  Talk with your doctor about using more than one strategy at the same time, such as taking medicines while you are also going to in-person counseling. This can help make quitting easier. What things can I do to make it easier to quit? Quitting smoking might feel very hard at first, but there is a lot that you can do to make it easier. Take these steps:  Talk to your family and friends. Ask them to support and encourage you.  Call phone quitlines, reach out to support groups, or work with a Veterinary surgeoncounselor.  Ask people who smoke to not smoke around you.  Avoid places that make you want (trigger) to smoke, such as: ? Bars. ? Parties. ? Smoke-break areas at work.  Spend time with people who do not smoke.  Lower the stress in your life. Stress can make you want to smoke. Try these things to help your stress: ? Getting regular exercise. ? Deep-breathing exercises. ? Yoga. ? Meditating. ? Doing a  body scan. To do this, close your eyes, focus on one area of your body at a time from head to toe, and notice which parts of your body are tense. Try to relax the muscles in those areas.  Download or buy apps on your mobile phone or tablet that can help you stick to your quit plan. There are many free apps, such as QuitGuide from the [employer] developer(Centers for Disease Control and Prevention). You can find more support from smokefree.gov and other websites.  This information is not intended to replace advice given to  you by your health care provider. Make sure you discuss any questions you have with your health care provider. Document Released: 12/06/2008 Document Revised: 10/08/2015 Document Reviewed: 06/26/2014 Elsevier Interactive Patient Education  2018 [employer]

## 2017-01-07 NOTE — Addendum Note (Signed)
Addended by: Burton ApleyPETTY, Erian Rosengren A on: 01/07/2017 02:31 PM   Modules accepted: Orders

## 2017-01-20 ENCOUNTER — Other Ambulatory Visit: Payer: Self-pay | Admitting: Family Medicine

## 2017-01-21 NOTE — Telephone Encounter (Signed)
6 refills °

## 2017-02-20 ENCOUNTER — Other Ambulatory Visit: Payer: Self-pay | Admitting: Family Medicine

## 2017-03-07 ENCOUNTER — Other Ambulatory Visit: Payer: Self-pay | Admitting: Family Medicine

## 2017-03-15 ENCOUNTER — Inpatient Hospital Stay: Admission: RE | Admit: 2017-03-15 | Payer: PPO | Source: Ambulatory Visit

## 2017-03-16 ENCOUNTER — Ambulatory Visit (HOSPITAL_COMMUNITY)
Admission: RE | Admit: 2017-03-16 | Discharge: 2017-03-16 | Disposition: A | Discharge status: Home / Self Care | Payer: PPO | Source: Ambulatory Visit | Attending: Acute Care | Admitting: Acute Care

## 2017-03-16 DIAGNOSIS — F17210 Nicotine dependence, cigarettes, uncomplicated: Secondary | ICD-10-CM | POA: Insufficient documentation

## 2017-03-16 DIAGNOSIS — J438 Other emphysema: Secondary | ICD-10-CM | POA: Diagnosis not present

## 2017-03-16 DIAGNOSIS — R911 Solitary pulmonary nodule: Secondary | ICD-10-CM | POA: Insufficient documentation

## 2017-03-16 DIAGNOSIS — Z122 Encounter for screening for malignant neoplasm of respiratory organs: Secondary | ICD-10-CM | POA: Insufficient documentation

## 2017-03-16 DIAGNOSIS — R9389 Abnormal findings on diagnostic imaging of other specified body structures: Secondary | ICD-10-CM | POA: Diagnosis not present

## 2017-03-16 DIAGNOSIS — I700 Atherosclerosis of aorta: Secondary | ICD-10-CM | POA: Diagnosis not present

## 2017-03-16 DIAGNOSIS — J432 Centrilobular emphysema: Secondary | ICD-10-CM | POA: Diagnosis not present

## 2017-03-16 DIAGNOSIS — J439 Emphysema, unspecified: Secondary | ICD-10-CM | POA: Diagnosis not present

## 2017-03-22 ENCOUNTER — Other Ambulatory Visit: Payer: Self-pay | Admitting: Acute Care

## 2017-03-22 DIAGNOSIS — F17210 Nicotine dependence, cigarettes, uncomplicated: Secondary | ICD-10-CM

## 2017-03-22 DIAGNOSIS — Z122 Encounter for screening for malignant neoplasm of respiratory organs: Secondary | ICD-10-CM

## 2017-03-26 ENCOUNTER — Other Ambulatory Visit: Payer: Self-pay | Admitting: Family Medicine

## 2017-04-23 ENCOUNTER — Other Ambulatory Visit: Payer: Self-pay | Admitting: Family Medicine

## 2017-05-03 ENCOUNTER — Encounter: Payer: Self-pay | Admitting: Family Medicine

## 2017-05-03 ENCOUNTER — Ambulatory Visit (INDEPENDENT_AMBULATORY_CARE_PROVIDER_SITE_OTHER): Payer: PPO | Admitting: Family Medicine

## 2017-05-03 VITALS — BP 110/68 | Ht 67.0 in | Wt 206.0 lb

## 2017-05-03 DIAGNOSIS — J438 Other emphysema: Secondary | ICD-10-CM

## 2017-05-03 DIAGNOSIS — D72829 Elevated white blood cell count, unspecified: Secondary | ICD-10-CM

## 2017-05-03 DIAGNOSIS — Z1159 Encounter for screening for other viral diseases: Secondary | ICD-10-CM

## 2017-05-03 DIAGNOSIS — I10 Essential (primary) hypertension: Secondary | ICD-10-CM | POA: Diagnosis not present

## 2017-05-03 DIAGNOSIS — E669 Obesity, unspecified: Secondary | ICD-10-CM | POA: Diagnosis not present

## 2017-05-03 DIAGNOSIS — Z6832 Body mass index (BMI) 32.0-32.9, adult: Secondary | ICD-10-CM | POA: Diagnosis not present

## 2017-05-03 DIAGNOSIS — N182 Chronic kidney disease, stage 2 (mild): Secondary | ICD-10-CM

## 2017-05-03 MED ORDER — LISINOPRIL 2.5 MG PO TABS: 2.5000 mg | ORAL_TABLET | Freq: Every day | ORAL | 3 refills | Status: DC

## 2017-05-03 MED ORDER — BUDESONIDE 0.5 MG/2ML IN SUSP: RESPIRATORY_TRACT | 12 refills | Status: DC

## 2017-05-03 MED ORDER — BUDESONIDE-FORMOTEROL FUMARATE 160-4.5 MCG/ACT IN AERO: 2.0000 | INHALATION_SPRAY | Freq: Two times a day (BID) | RESPIRATORY_TRACT | 4 refills | Status: DC

## 2017-05-03 MED ORDER — TIOTROPIUM BROMIDE MONOHYDRATE 18 MCG IN CAPS: ORAL_CAPSULE | RESPIRATORY_TRACT | 3 refills | Status: DC

## 2017-05-03 MED ORDER — ALBUTEROL SULFATE (2.5 MG/3ML) 0.083% IN NEBU: 2.5000 mg | INHALATION_SOLUTION | RESPIRATORY_TRACT | 12 refills | Status: DC | PRN

## 2017-05-03 NOTE — Progress Notes (Signed)
   Subjective:    Patient ID: Blake Cherry, male    DOB: 05/16/1945, 72 y.o.   MRN: [id number]  Hypertension  This is a chronic problem. The current episode started more than 1 year ago. Pertinent negatives include no chest pain, headaches or shortness of breath. Risk factors for coronary artery disease include male gender. Treatments tried: lisinopril. There are no compliance problems.    Chronic kidney disease more than likely related to smoking atherosclerosis as well as blood pressure patient does try to eat somewhat healthy he does not exercise still smokes  Patient does use tobacco products.  Patient knows they should quit.  Patient is aware that smoking/in use of tobacco products increases their risk of heart disease, cancer, and COPD- lung issues.  Patient has been counseled to quit smoking/tobacco products.  Patient has had some mild leukocytosis probably related to smoking but will need to recheck this  Patient has COPD he uses his medication he knows to stay away from smoking but does not does have occasional flares    Review of Systems  Constitutional: Negative for activity change, appetite change and fatigue.  HENT: Negative for congestion and rhinorrhea.   Respiratory: Negative for cough, chest tightness and shortness of breath.   Cardiovascular: Negative for chest pain and leg swelling.  Gastrointestinal: Negative for abdominal pain, diarrhea and nausea.  Endocrine: Negative for polydipsia and polyphagia.  Genitourinary: Negative for dysuria and hematuria.  Neurological: Negative for weakness and headaches.  Psychiatric/Behavioral: Negative for confusion and dysphoric mood.       Objective:   Physical Exam  Constitutional: He appears well-nourished. No distress.  HENT:  Head: Normocephalic and atraumatic.  Eyes: Right eye exhibits no discharge. Left eye exhibits no discharge.  Neck: No tracheal deviation present.  Cardiovascular: Normal rate, regular rhythm and  normal heart sounds.  No murmur heard. Pulmonary/Chest: Effort normal and breath sounds normal. No respiratory distress. He has no wheezes.  Musculoskeletal: He exhibits no edema.  Lymphadenopathy:    He has no cervical adenopathy.  Neurological: He is alert.  Skin: Skin is warm. No rash noted.  Psychiatric: His behavior is normal.  Vitals reviewed.         Assessment & Plan:  1. Essential hypertension, benign Blood pressure under good control continue current medications watch diet - Basic metabolic panel  2. CKD (chronic kidney disease), stage II Patient needs to minimize protein in the diet stay active as possible follow-up labs recommended recheck again in 6 months - Basic metabolic panel  3. Other emphysema (HCC) Continue COPD medications.  He does have intermittent flareups that Dr. Juanetta GoslingHawkins tends to  4. Leukocytosis, unspecified type Intermittent leukocytosis check CBC await the results of this may need hematology consultation if this is going up - CBC with Differential/Platelet  5. Encounter for hepatitis C screening test for low risk patient Screening - Hepatitis C Antibody  The patient's BMI is calculated.  It is in the vital signs and acknowledged.  It is above the recommended BMI for the patient's height and weight.  The patient has been counseled regarding healthy diet, restricted portions, avoiding excessive carbohydrates/sugary foods, and increase physical activity as health permits.  It is in the patient's best interest to lower the risk of secondary illness including heart disease strokes and cancer by losing weight.  The patient acknowledges this information.

## 2017-05-13 ENCOUNTER — Other Ambulatory Visit: Payer: Self-pay | Admitting: Family Medicine

## 2017-05-24 DIAGNOSIS — I10 Essential (primary) hypertension: Secondary | ICD-10-CM | POA: Diagnosis not present

## 2017-05-24 DIAGNOSIS — Z1159 Encounter for screening for other viral diseases: Secondary | ICD-10-CM | POA: Diagnosis not present

## 2017-05-24 DIAGNOSIS — N182 Chronic kidney disease, stage 2 (mild): Secondary | ICD-10-CM | POA: Diagnosis not present

## 2017-05-24 DIAGNOSIS — D72829 Elevated white blood cell count, unspecified: Secondary | ICD-10-CM | POA: Diagnosis not present

## 2017-05-25 LAB — CBC WITH DIFFERENTIAL/PLATELET
Basophils Absolute: 0.1 10*3/uL (ref 0.0–0.2)
Basos: 1 %
EOS (ABSOLUTE): 0.7 10*3/uL — ABNORMAL HIGH (ref 0.0–0.4)
Eos: 5 %
Hematocrit: 45.1 % (ref 37.5–51.0)
Hemoglobin: 15.3 g/dL (ref 13.0–17.7)
Immature Grans (Abs): 0 10*3/uL (ref 0.0–0.1)
Immature Granulocytes: 0 %
Lymphocytes Absolute: 1.7 10*3/uL (ref 0.7–3.1)
Lymphs: 14 %
MCH: 32.1 pg (ref 26.6–33.0)
MCHC: 33.9 g/dL (ref 31.5–35.7)
MCV: 95 fL (ref 79–97)
Monocytes Absolute: 0.9 10*3/uL (ref 0.1–0.9)
Monocytes: 7 %
Neutrophils Absolute: 8.9 10*3/uL — ABNORMAL HIGH (ref 1.4–7.0)
Neutrophils: 73 %
Platelets: 355 10*3/uL (ref 150–379)
RBC: 4.76 x10E6/uL (ref 4.14–5.80)
RDW: 13.1 % (ref 12.3–15.4)
WBC: 12.2 10*3/uL — ABNORMAL HIGH (ref 3.4–10.8)

## 2017-05-25 LAB — BASIC METABOLIC PANEL
BUN/Creatinine Ratio: 12 (ref 10–24)
BUN: 18 mg/dL (ref 8–27)
CO2: 22 mmol/L (ref 20–29)
Calcium: 9.4 mg/dL (ref 8.6–10.2)
Chloride: 102 mmol/L (ref 96–106)
Creatinine, Ser: 1.49 mg/dL — ABNORMAL HIGH (ref 0.76–1.27)
GFR calc Af Amer: 54 mL/min/{1.73_m2} — ABNORMAL LOW (ref >59)
GFR calc non Af Amer: 47 mL/min/{1.73_m2} — ABNORMAL LOW (ref >59)
Glucose: 87 mg/dL (ref 65–99)
Potassium: 5.2 mmol/L (ref 3.5–5.2)
Sodium: 142 mmol/L (ref 134–144)

## 2017-05-25 LAB — HEPATITIS C ANTIBODY: Hep C Virus Ab: <0.1 s/co ratio (ref 0.0–0.9)

## 2017-06-17 DIAGNOSIS — G8192 Hemiplegia, unspecified affecting left dominant side: Secondary | ICD-10-CM | POA: Diagnosis not present

## 2017-06-17 DIAGNOSIS — J301 Allergic rhinitis due to pollen: Secondary | ICD-10-CM | POA: Diagnosis not present

## 2017-06-17 DIAGNOSIS — J449 Chronic obstructive pulmonary disease, unspecified: Secondary | ICD-10-CM | POA: Diagnosis not present

## 2017-06-17 DIAGNOSIS — I10 Essential (primary) hypertension: Secondary | ICD-10-CM | POA: Diagnosis not present

## 2017-08-24 ENCOUNTER — Ambulatory Visit (INDEPENDENT_AMBULATORY_CARE_PROVIDER_SITE_OTHER): Payer: PPO | Admitting: Family Medicine

## 2017-08-24 VITALS — BP 132/82 | HR 109 | Temp 98.3°F | Ht 67.0 in | Wt 196.6 lb

## 2017-08-24 DIAGNOSIS — G2581 Restless legs syndrome: Secondary | ICD-10-CM

## 2017-08-24 DIAGNOSIS — J441 Chronic obstructive pulmonary disease with (acute) exacerbation: Secondary | ICD-10-CM | POA: Diagnosis not present

## 2017-08-24 MED ORDER — AMOXICILLIN-POT CLAVULANATE 875-125 MG PO TABS: 1.0000 | ORAL_TABLET | Freq: Two times a day (BID) | ORAL | 0 refills | Status: DC

## 2017-08-24 MED ORDER — PREDNISONE 20 MG PO TABS: ORAL_TABLET | ORAL | 0 refills | Status: DC

## 2017-08-24 MED ORDER — ROPINIROLE HCL 0.5 MG PO TABS: 0.5000 mg | ORAL_TABLET | Freq: Three times a day (TID) | ORAL | 5 refills | Status: DC

## 2017-08-24 NOTE — Progress Notes (Signed)
   Subjective:    Patient ID: Blake Cherry, male    DOB: 03/01/1945, 72 y.o.   MRN: [id number]  HPI  Patient arrives with wheezing and congestion or fluid in chest since weekend causing SOB.   Patient relates a lot of congestion coughing wheezing intermittently over the past few days in addition to this relates feeling fatigued tired coughing up phlegm patient has a long history of COPD that is getting worse plus he also has smoking history O 2 sat 91% in room air Patient also relates restless legs is been plaguing him over the past several months worse over the past few days makes it difficult for him to fall asleep at night   Review of Systems  Constitutional: Negative for activity change, chills and fever.  HENT: Positive for congestion and rhinorrhea. Negative for ear pain.   Eyes: Negative for discharge.  Respiratory: Positive for cough. Negative for wheezing.   Cardiovascular: Negative for chest pain.  Gastrointestinal: Negative for nausea and vomiting.  Musculoskeletal: Negative for arthralgias.       Objective:   Physical Exam  Constitutional: He appears well-developed.  HENT:  Head: Normocephalic.  Mouth/Throat: Oropharynx is clear and moist. No oropharyngeal exudate.  Neck: Normal range of motion.  Cardiovascular: Normal rate, regular rhythm and normal heart sounds.  No murmur heard. Pulmonary/Chest: Effort normal. He has wheezes.  Lymphadenopathy:    He has no cervical adenopathy.  Neurological: He exhibits normal muscle tone.  Skin: Skin is warm and dry.  Nursing note and vitals reviewed.         Assessment & Plan:  COPD exacerbation Prednisone taper Antibiotics Warning signs discussed Follow-up if progressive troubles Quit smoking Restless legs very important for the patient to try this medication if he does not do enough he is to let us know follow-up if progressive troubles or worse

## 2017-10-03 ENCOUNTER — Other Ambulatory Visit: Payer: Self-pay

## 2017-10-03 ENCOUNTER — Emergency Department (HOSPITAL_COMMUNITY): Payer: PPO

## 2017-10-03 ENCOUNTER — Observation Stay (HOSPITAL_COMMUNITY)
Admission: EM | Admit: 2017-10-03 | Discharge: 2017-10-04 | Disposition: A | Discharge status: Home / Self Care | Payer: PPO | Attending: Internal Medicine | Admitting: Internal Medicine

## 2017-10-03 ENCOUNTER — Encounter (HOSPITAL_COMMUNITY): Payer: Self-pay

## 2017-10-03 DIAGNOSIS — Z955 Presence of coronary angioplasty implant and graft: Secondary | ICD-10-CM | POA: Insufficient documentation

## 2017-10-03 DIAGNOSIS — I471 Supraventricular tachycardia, unspecified: Secondary | ICD-10-CM | POA: Diagnosis present

## 2017-10-03 DIAGNOSIS — Z95828 Presence of other vascular implants and grafts: Secondary | ICD-10-CM | POA: Insufficient documentation

## 2017-10-03 DIAGNOSIS — F17210 Nicotine dependence, cigarettes, uncomplicated: Secondary | ICD-10-CM | POA: Insufficient documentation

## 2017-10-03 DIAGNOSIS — E669 Obesity, unspecified: Secondary | ICD-10-CM | POA: Diagnosis not present

## 2017-10-03 DIAGNOSIS — Z7951 Long term (current) use of inhaled steroids: Secondary | ICD-10-CM | POA: Diagnosis not present

## 2017-10-03 DIAGNOSIS — E7849 Other hyperlipidemia: Secondary | ICD-10-CM

## 2017-10-03 DIAGNOSIS — J438 Other emphysema: Secondary | ICD-10-CM

## 2017-10-03 DIAGNOSIS — Z79899 Other long term (current) drug therapy: Secondary | ICD-10-CM | POA: Diagnosis not present

## 2017-10-03 DIAGNOSIS — Z7982 Long term (current) use of aspirin: Secondary | ICD-10-CM | POA: Insufficient documentation

## 2017-10-03 DIAGNOSIS — N183 Chronic kidney disease, stage 3 unspecified: Secondary | ICD-10-CM | POA: Diagnosis present

## 2017-10-03 DIAGNOSIS — E785 Hyperlipidemia, unspecified: Secondary | ICD-10-CM | POA: Diagnosis not present

## 2017-10-03 DIAGNOSIS — R748 Abnormal levels of other serum enzymes: Secondary | ICD-10-CM | POA: Insufficient documentation

## 2017-10-03 DIAGNOSIS — J449 Chronic obstructive pulmonary disease, unspecified: Secondary | ICD-10-CM | POA: Diagnosis present

## 2017-10-03 DIAGNOSIS — I2510 Atherosclerotic heart disease of native coronary artery without angina pectoris: Secondary | ICD-10-CM | POA: Insufficient documentation

## 2017-10-03 DIAGNOSIS — I10 Essential (primary) hypertension: Secondary | ICD-10-CM | POA: Diagnosis not present

## 2017-10-03 DIAGNOSIS — I129 Hypertensive chronic kidney disease with stage 1 through stage 4 chronic kidney disease, or unspecified chronic kidney disease: Secondary | ICD-10-CM | POA: Insufficient documentation

## 2017-10-03 DIAGNOSIS — Z8673 Personal history of transient ischemic attack (TIA), and cerebral infarction without residual deficits: Secondary | ICD-10-CM | POA: Insufficient documentation

## 2017-10-03 DIAGNOSIS — N182 Chronic kidney disease, stage 2 (mild): Secondary | ICD-10-CM | POA: Diagnosis not present

## 2017-10-03 DIAGNOSIS — N2889 Other specified disorders of kidney and ureter: Secondary | ICD-10-CM | POA: Diagnosis present

## 2017-10-03 DIAGNOSIS — R0602 Shortness of breath: Secondary | ICD-10-CM | POA: Diagnosis not present

## 2017-10-03 DIAGNOSIS — R079 Chest pain, unspecified: Secondary | ICD-10-CM | POA: Diagnosis present

## 2017-10-03 LAB — CBC WITH DIFFERENTIAL/PLATELET
Basophils Absolute: 0 10*3/uL (ref 0.0–0.1)
Basophils Relative: 0 %
Eosinophils Absolute: 0 10*3/uL (ref 0.0–0.7)
Eosinophils Relative: 0 %
HCT: 46.9 % (ref 39.0–52.0)
Hemoglobin: 15.2 g/dL (ref 13.0–17.0)
Lymphocytes Relative: 7 %
Lymphs Abs: 0.8 10*3/uL (ref 0.7–4.0)
MCH: 33.1 pg (ref 26.0–34.0)
MCHC: 32.4 g/dL (ref 30.0–36.0)
MCV: 102.2 fL — ABNORMAL HIGH (ref 78.0–100.0)
Monocytes Absolute: 0.4 10*3/uL (ref 0.1–1.0)
Monocytes Relative: 4 %
Neutro Abs: 10.6 10*3/uL — ABNORMAL HIGH (ref 1.7–7.7)
Neutrophils Relative %: 89 %
Platelets: 327 10*3/uL (ref 150–400)
RBC: 4.59 MIL/uL (ref 4.22–5.81)
RDW: 13.9 % (ref 11.5–15.5)
WBC: 11.9 10*3/uL — ABNORMAL HIGH (ref 4.0–10.5)

## 2017-10-03 LAB — COMPREHENSIVE METABOLIC PANEL
ALT: 11 U/L (ref 0–44)
AST: 21 U/L (ref 15–41)
Albumin: 4.3 g/dL (ref 3.5–5.0)
Alkaline Phosphatase: 52 U/L (ref 38–126)
Anion gap: 11 (ref 5–15)
BUN: 34 mg/dL — ABNORMAL HIGH (ref 8–23)
CO2: 26 mmol/L (ref 22–32)
Calcium: 9.6 mg/dL (ref 8.9–10.3)
Chloride: 105 mmol/L (ref 98–111)
Creatinine, Ser: 1.88 mg/dL — ABNORMAL HIGH (ref 0.61–1.24)
GFR calc Af Amer: 40 mL/min — ABNORMAL LOW (ref >60)
GFR calc non Af Amer: 34 mL/min — ABNORMAL LOW (ref >60)
Glucose, Bld: 146 mg/dL — ABNORMAL HIGH (ref 70–99)
Potassium: 4.6 mmol/L (ref 3.5–5.1)
Sodium: 142 mmol/L (ref 135–145)
Total Bilirubin: 0.8 mg/dL (ref 0.3–1.2)
Total Protein: 7.4 g/dL (ref 6.5–8.1)

## 2017-10-03 LAB — I-STAT CHEM 8, ED
BUN: 36 mg/dL — ABNORMAL HIGH (ref 8–23)
Calcium, Ion: 1.25 mmol/L (ref 1.15–1.40)
Chloride: 106 mmol/L (ref 98–111)
Creatinine, Ser: 2 mg/dL — ABNORMAL HIGH (ref 0.61–1.24)
Glucose, Bld: 143 mg/dL — ABNORMAL HIGH (ref 70–99)
HCT: 46 % (ref 39.0–52.0)
Hemoglobin: 15.6 g/dL (ref 13.0–17.0)
Potassium: 4.6 mmol/L (ref 3.5–5.1)
Sodium: 141 mmol/L (ref 135–145)
TCO2: 25 mmol/L (ref 22–32)

## 2017-10-03 LAB — I-STAT TROPONIN, ED: Troponin i, poc: 0.01 ng/mL (ref 0.00–0.08)

## 2017-10-03 LAB — TSH: TSH: 1.18 u[IU]/mL (ref 0.350–4.500)

## 2017-10-03 LAB — TROPONIN I
Troponin I: <0.03 ng/mL (ref <0.03)
Troponin I: 0.05 ng/mL (ref <0.03)

## 2017-10-03 LAB — MRSA PCR SCREENING: MRSA by PCR: NEGATIVE

## 2017-10-03 MED ORDER — ADENOSINE 6 MG/2ML IV SOLN
12.0000 mg | Freq: Once | INTRAVENOUS | Status: AC
  Administered 2017-10-03: 12 mg via INTRAVENOUS

## 2017-10-03 MED ORDER — TIOTROPIUM BROMIDE MONOHYDRATE 18 MCG IN CAPS
18.0000 ug | ORAL_CAPSULE | Freq: Every day | RESPIRATORY_TRACT | Status: DC
  Administered 2017-10-03 – 2017-10-04 (×2): 18 ug via RESPIRATORY_TRACT
  Filled 2017-10-03 (×2): qty 5

## 2017-10-03 MED ORDER — ENOXAPARIN SODIUM 40 MG/0.4ML ~~LOC~~ SOLN
40.0000 mg | SUBCUTANEOUS | Status: DC
  Administered 2017-10-03: 40 mg via SUBCUTANEOUS
  Filled 2017-10-03: qty 0.4

## 2017-10-03 MED ORDER — MOMETASONE FURO-FORMOTEROL FUM 200-5 MCG/ACT IN AERO
2.0000 | INHALATION_SPRAY | Freq: Two times a day (BID) | RESPIRATORY_TRACT | Status: DC
  Administered 2017-10-03 – 2017-10-04 (×2): 2 via RESPIRATORY_TRACT
  Filled 2017-10-03 (×2): qty 8.8

## 2017-10-03 MED ORDER — DILTIAZEM HCL-DEXTROSE 100-5 MG/100ML-% IV SOLN (PREMIX)
INTRAVENOUS | Status: AC
  Filled 2017-10-03: qty 100

## 2017-10-03 MED ORDER — GUAIFENESIN ER 600 MG PO TB12
600.0000 mg | ORAL_TABLET | Freq: Two times a day (BID) | ORAL | Status: DC | PRN
  Administered 2017-10-03: 600 mg via ORAL
  Filled 2017-10-03: qty 1

## 2017-10-03 MED ORDER — ADENOSINE 6 MG/2ML IV SOLN
12.0000 mg | Freq: Once | INTRAVENOUS | Status: AC
  Administered 2017-10-03: 6 mg via INTRAVENOUS
  Administered 2017-10-03: 12 mg via INTRAVENOUS

## 2017-10-03 MED ORDER — ACETAMINOPHEN 325 MG PO TABS: 650.0000 mg | ORAL_TABLET | Freq: Four times a day (QID) | ORAL | Status: DC | PRN

## 2017-10-03 MED ORDER — DILTIAZEM LOAD VIA INFUSION
15.0000 mg | Freq: Once | INTRAVENOUS | Status: AC
  Administered 2017-10-03: 15 mg via INTRAVENOUS

## 2017-10-03 MED ORDER — ALBUTEROL SULFATE (2.5 MG/3ML) 0.083% IN NEBU
3.0000 mL | INHALATION_SOLUTION | RESPIRATORY_TRACT | Status: DC | PRN
Start: 2017-10-03 — End: 2017-10-04

## 2017-10-03 MED ORDER — LISINOPRIL 5 MG PO TABS
2.5000 mg | ORAL_TABLET | Freq: Every day | ORAL | Status: DC
  Administered 2017-10-03 – 2017-10-04 (×2): 2.5 mg via ORAL
  Filled 2017-10-03 (×2): qty 1

## 2017-10-03 MED ORDER — ACETAMINOPHEN 650 MG RE SUPP: 650.0000 mg | Freq: Four times a day (QID) | RECTAL | Status: DC | PRN

## 2017-10-03 MED ORDER — ADENOSINE 6 MG/2ML IV SOLN
INTRAVENOUS | Status: AC
  Administered 2017-10-03: 6 mg via INTRAVENOUS
  Filled 2017-10-03: qty 10

## 2017-10-03 MED ORDER — SODIUM CHLORIDE 0.9 % IV BOLUS
500.0000 mL | Freq: Once | INTRAVENOUS | Status: AC
  Administered 2017-10-03: 500 mL via INTRAVENOUS

## 2017-10-03 MED ORDER — ASPIRIN EC 81 MG PO TBEC
81.0000 mg | DELAYED_RELEASE_TABLET | Freq: Every day | ORAL | Status: DC
  Administered 2017-10-04: 81 mg via ORAL
  Filled 2017-10-03: qty 1

## 2017-10-03 MED ORDER — DOXYCYCLINE HYCLATE 100 MG PO TABS
100.0000 mg | ORAL_TABLET | Freq: Two times a day (BID) | ORAL | Status: DC
  Administered 2017-10-03 – 2017-10-04 (×2): 100 mg via ORAL
  Filled 2017-10-03 (×8): qty 1

## 2017-10-03 MED ORDER — ROPINIROLE HCL 0.25 MG PO TABS
0.5000 mg | ORAL_TABLET | Freq: Three times a day (TID) | ORAL | Status: DC
  Administered 2017-10-03 – 2017-10-04 (×2): 0.5 mg via ORAL
  Filled 2017-10-03: qty 1
  Filled 2017-10-03 (×5): qty 2
  Filled 2017-10-03: qty 1
  Filled 2017-10-03: qty 2

## 2017-10-03 MED ORDER — ROPINIROLE HCL 0.25 MG PO TABS
ORAL_TABLET | ORAL | Status: AC
  Filled 2017-10-03: qty 2

## 2017-10-03 MED ORDER — DILTIAZEM HCL-DEXTROSE 100-5 MG/100ML-% IV SOLN (PREMIX)
5.0000 mg/h | INTRAVENOUS | Status: DC
  Administered 2017-10-03: 10 mg/h via INTRAVENOUS
  Administered 2017-10-03: 5 mg/h via INTRAVENOUS
  Filled 2017-10-03 (×2): qty 100

## 2017-10-03 NOTE — ED Notes (Signed)
PT STARTED COUGHING AND HIS HEART RATE WENT UP TO 180'S. DR LONG NOTIFIED AND HE WAS RESTARTED ON CARDIZEM AT 5 MG/HR WITH A 10 MG BOLUS. PT CONVERTED BACK TO A SINUS RHYTHM.

## 2017-10-03 NOTE — ED Triage Notes (Signed)
Pt reports he has checked his pulse at home and it was 180-190. Pt feels tightness in chest. Slight SOB. Pt reports he was watching TV. Reports feeling lightheaded at the time

## 2017-10-03 NOTE — H&P (Signed)
History and Physical  Blake Cherry ZOX:[id number] DOB: 04-13-1945 DOA: 10/03/2017  Referring physician: Dr Jacqulyn Bath, ED physician PCP: [name], MD  Outpatient Specialists:   Patient Coming From: Home  Chief Complaint: Rapid heart rate, chest discomfort  HPI: Blake Cherry is a 72 y.o. male with a history of abdominal aortic aneurysm, COPD, hypertension, history of stroke in 2007 with no residual deficits, chronic kidney disease stage III.  Patient seen for chest tightness and palpitations that started this afternoon.  He felt his heart beating rapidly, mild shortness of breath, and some chest tightness.  He Checked his pulse oximetry, which showed good oxygenation but heart rate in the 180s.  He then checked his blood pressure which showed an elevated blood pressure with tachycardia in the 190s.  Patient came to the hospital for evaluation.  He was given adenosine x3 doses (6 mg, 12 mg, 12 mg) this did not help.  He was given Cardizem drip, which began to help after a bolus.  His heart rate has been intermittently controlled on the Cardizem, but responds to increases.  No other palliating or provoking factors.  His symptoms are improving.  He is a coffee drinker, but did not drink more coffee than usual.  Review of Systems:   Pt denies any fevers, chills, nausea, vomiting, diarrhea, constipation, abdominal pain, shortness of breath, dyspnea on exertion, orthopnea, cough, wheezing, palpitations, headache, vision changes, lightheadedness, dizziness, melena, rectal bleeding.  Review of systems are otherwise negative  Past Medical History:  Diagnosis Date  . AAA (abdominal aortic aneurysm) (HCC)   . Carotid artery occlusion 2007   Left cea  . COPD (chronic obstructive pulmonary disease) (HCC)   . Diverticulitis    last time 4 years ago  . Hypertension    per Dr Tenny Craw  . Shortness of breath    with exertion  . Stroke [facility]) 05/2005    2 in 2007.  Weakness and numbness in left arm  and leg   Past Surgical History:  Procedure Laterality Date  . ABDOMINAL AORTAGRAM N/A 03/10/2013   Procedure: ABDOMINAL Ronny Flurry;  Surgeon: Sherren Kerns, MD;  Location: [facility] CATH LAB;  Service: Cardiovascular;  Laterality: N/A;  . ABDOMINAL AORTIC ENDOVASCULAR STENT GRAFT N/A 08/22/2012   Procedure: ABDOMINAL AORTIC ENDOVASCULAR STENT GRAFT;  Surgeon: Sherren Kerns, MD;  Location: [facility] OR;  Service: Vascular;  Laterality: N/A;  . CAROTID ENDARTERECTOMY Left 2007  . CAROTID STENT  2007   left internal carotid artery  . TESTICLE TORSION REDUCTION     age of 2   Social History:  reports that he has been smoking cigarettes. He has a 50.00 pack-year smoking history. He has quit using smokeless tobacco.  His smokeless tobacco use included snuff. He reports that he does not drink alcohol or use drugs. Patient lives at home  No Known Allergies  Family History  Problem Relation Age of Onset  . Cancer Mother        Breast cancer  . Hypertension Mother   . Diabetes Mother   . Diabetes Father       Prior to Admission medications   Medication Sig Start Date End Date Taking? Authorizing Provider  albuterol (PROVENTIL HFA;VENTOLIN HFA) 108 (90 Base) MCG/ACT inhaler INHALE 2 PUFFS BY MOUTH EVERY 6 HOURS AS NEEDED FOR WHEEZING OR SHORTNESS OF BREATH 05/14/17  Yes Luking, Scott A, MD  albuterol (PROVENTIL) (2.5 MG/3ML) 0.083% nebulizer solution Take 3 mLs (2.5 mg total) by nebulization every  4 (four) hours as needed for wheezing or shortness of breath. 05/03/17  Yes Babs SciaraLuking, Scott A, MD  aspirin EC 81 MG tablet Take 81 mg by mouth daily.   Yes [provider]  budesonide (PULMICORT) 0.5 MG/2ML nebulizer solution USE 1 VIAL IN NEBULIZER TWICE DAILY 05/03/17  Yes Luking, Jonna CoupScott A, MD  budesonide-formoterol (SYMBICORT) 160-4.5 MCG/ACT inhaler Inhale 2 puffs into the lungs 2 (two) times daily. 05/03/17  Yes Babs SciaraLuking, Scott A, MD  doxycycline (VIBRAMYCIN) 100 MG capsule Take 100 mg by mouth 2 (two)  times daily. Starting 09/28/2017 x 10 days.   Yes [provider]  guaiFENesin (MUCINEX) 600 MG 12 hr tablet Take 1 tablet (600 mg total) by mouth 2 (two) times daily. 09/19/14  Yes Erick BlinksMemon, Jehanzeb, MD  lisinopril (PRINIVIL,ZESTRIL) 2.5 MG tablet Take 1 tablet (2.5 mg total) by mouth daily. 05/03/17  Yes Luking, Jonna CoupScott A, MD  Multiple Vitamins-Minerals (OCUVITE PRESERVISION PO) Take 1 capsule by mouth daily.    Yes [provider]  predniSONE (DELTASONE) 10 MG tablet Take 10-30 mg by mouth See admin instructions. 30 mg x 3 days, 20 mg x 3 days, 10 mg x 3 days. 09/27/17  Yes [provider]  rOPINIRole (REQUIP) 0.5 MG tablet Take 1 tablet (0.5 mg total) by mouth 3 (three) times daily. Patient taking differently: Take 0.5 mg by mouth 2 (two) times daily.  08/24/17  Yes Babs SciaraLuking, Scott A, MD  tiotropium (SPIRIVA HANDIHALER) 18 MCG inhalation capsule PLACE 1 CAPSULE INTO INHALER AND INHALE DAILY 05/03/17  Yes Babs SciaraLuking, Scott A, MD    Physical Exam: BP 127/89   Pulse (!) 51   Temp 97.8 F (36.6 C) (Oral)   Resp 19   Wt 86.2 kg   SpO2 97%   BMI 29.76 kg/m   . General: Elderly Caucasian male. Awake and alert and oriented x3. No acute cardiopulmonary distress.  Marland Kitchen. HEENT: Normocephalic atraumatic.  Right and left ears normal in appearance.  Pupils equal, round, reactive to light. Extraocular muscles are intact. Sclerae anicteric and noninjected.  Moist mucosal membranes. No mucosal lesions.  . Neck: Neck supple without lymphadenopathy. No carotid bruits. No masses palpated.  . Cardiovascular: Regular rate with normal S1-S2 sounds. No murmurs, rubs, gallops auscultated. No JVD.  Marland Kitchen. Respiratory: Good respiratory effort with mild wheezes, but no rales, rhonchi. Lungs clear to auscultation bilaterally.  No accessory muscle use. . Abdomen: Soft, nontender, nondistended. Active bowel sounds. No masses or hepatosplenomegaly  . Skin: No rashes, lesions, or ulcerations.  Dry, warm to touch. 2+  dorsalis pedis and radial pulses. . Musculoskeletal: No calf or leg pain. All major joints not erythematous nontender.  No upper or lower joint deformation.  Good ROM.  No contractures  . Psychiatric: Intact judgment and insight. Pleasant and cooperative. . Neurologic: No focal neurological deficits. Strength is 5/5 and symmetric in upper and lower extremities.  Cranial nerves II through XII are grossly intact.           Labs on Admission: I have personally reviewed following labs and imaging studies  CBC: Recent Labs  Lab 10/03/17 1515 10/03/17 1523  WBC 11.9*  --   NEUTROABS 10.6*  --   HGB 15.2 15.6  HCT 46.9 46.0  MCV 102.2*  --   PLT 327  --    Basic Metabolic Panel: Recent Labs  Lab 10/03/17 1515 10/03/17 1523  NA 142 141  K 4.6 4.6  CL 105 106  CO2 26  --  GLUCOSE 146* 143*  BUN 34* 36*  CREATININE 1.88* 2.00*  CALCIUM 9.6  --    GFR: Estimated Creatinine Clearance: 35.5 mL/min (A) (by C-G formula based on SCr of 2 mg/dL (H)). Liver Function Tests: Recent Labs  Lab 10/03/17 1515  AST 21  ALT 11  ALKPHOS 52  BILITOT 0.8  PROT 7.4  ALBUMIN 4.3   No results for input(s): LIPASE, AMYLASE in the last 168 hours. No results for input(s): AMMONIA in the last 168 hours. Coagulation Profile: No results for input(s): INR, PROTIME in the last 168 hours. Cardiac Enzymes: Recent Labs  Lab 10/03/17 1515  TROPONINI <0.03   BNP (last 3 results) No results for input(s): PROBNP in the last 8760 hours. HbA1C: No results for input(s): HGBA1C in the last 72 hours. CBG: No results for input(s): GLUCAP in the last 168 hours. Lipid Profile: No results for input(s): CHOL, HDL, LDLCALC, TRIG, CHOLHDL, LDLDIRECT in the last 72 hours. Thyroid Function Tests: Recent Labs    10/03/17 1515  TSH 1.180   Anemia Panel: No results for input(s): VITAMINB12, FOLATE, FERRITIN, TIBC, IRON, RETICCTPCT in the last 72 hours. Urine analysis:    Component Value Date/Time    COLORURINE YELLOW 08/16/2012 1545   APPEARANCEUR CLEAR 08/16/2012 1545   LABSPEC 1.023 08/16/2012 1545   PHURINE 5.5 08/16/2012 1545   GLUCOSEU NEGATIVE 08/16/2012 1545   HGBUR NEGATIVE 08/16/2012 1545   BILIRUBINUR NEGATIVE 08/16/2012 1545   KETONESUR NEGATIVE 08/16/2012 1545   PROTEINUR NEGATIVE 08/16/2012 1545   UROBILINOGEN 0.2 08/16/2012 1545   NITRITE NEGATIVE 08/16/2012 1545   LEUKOCYTESUR NEGATIVE 08/16/2012 1545   Sepsis Labs: @LABRCNTIP (procalcitonin:4,lacticidven:4) )No results found for this or any previous visit (from the past 240 hour(s)).   Radiological Exams on Admission: Dg Chest 2 View  Result Date: 10/03/2017 CLINICAL DATA:  Shortness of breath EXAM: CHEST - 2 VIEW COMPARISON:  03/16/2017 FINDINGS: The heart size and mediastinal contours are within normal limits. Both lungs are clear. The visualized skeletal structures are unremarkable. IMPRESSION: No active cardiopulmonary disease. Electronically Signed   By: Elige Ko   On: 10/03/2017 17:03    EKG: Independently reviewed.  SVT  Assessment/Plan: Principal Problem:   SVT (supraventricular tachycardia) (HCC) Active Problems:   Essential hypertension, benign   Hyperlipidemia   COPD (chronic obstructive pulmonary disease) (HCC)   CKD (chronic kidney disease), stage II    This patient was discussed with the ED physician, including pertinent vitals, physical exam findings, labs, and imaging.  We also discussed care given by the ED provider.  1. SVT a. Observation on telemetry b. EDP consulted with cardiology at [facility], who felt patient can stay up here and cardiology can consult on him in the morning. c. We will have serial troponins drawn d. Continue Cardizem e. Consult cardiology in a.m. 2. Hypertension a. Continue antihypertensives and Cardizem 3. Hyperlipidemia a. Continue statin 4. COPD a. Continue COPD regimen 5. Chronic kidney disease  DVT prophylaxis: Lovenox Consultants: Cardiology Code  Status: Full Family Communication: Wife present during interview and exam Disposition Plan: Patient should be able to return home following improvement of cardiac status   Noralee Chars [facility] Pager [phone]  If 7PM-7AM, please contact night-coverage [email or web address] Password [email or web address]

## 2017-10-03 NOTE — ED Provider Notes (Signed)
Emergency Department Provider Note   I have reviewed the triage vital signs and the nursing notes.   HISTORY  Chief Complaint Chest Pain   HPI Blake Cherry is a 72 y.o. male with PMH of COPD, AAA, HTN, and prior CVA the emergency department for evaluation of rapid heartbeat, chest pain, shortness of breath.  Symptoms began 1 hour prior to ED presentation.  Patient states he was at home watching television when this started.  No prior history of SVT.  He drinks coffee regularly but did not drink more coffee than normal today.  No fevers or chills.  No changes to home medications.  Radiation of symptoms or other modifying factors.   Past Medical History:  Diagnosis Date  . AAA (abdominal aortic aneurysm) (HCC)   . Carotid artery occlusion 2007   Left cea  . COPD (chronic obstructive pulmonary disease) (HCC)   . Diverticulitis    last time 4 years ago  . Hypertension    per Dr Tenny Craw  . Shortness of breath    with exertion  . Stroke [facility]) 05/2005    2 in 2007.  Weakness and numbness in left arm and leg    Patient Active Problem List   Diagnosis Date Noted  . SVT (supraventricular tachycardia) (HCC) 10/03/2017  . Leukocytosis 11/24/2016  . COPD with acute exacerbation (HCC) 09/16/2014  . AKI (acute kidney injury) (HCC) 09/16/2014  . 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 Hyperglycemia, drug-induced 09/16/2014  . Acute respiratory failure with hypoxia (HCC) 09/16/2014  . COPD (chronic obstructive pulmonary disease) (HCC) 07/18/2014  . Obesity 09/26/2012  . Aftercare following surgery of the circulatory system, NEC 09/22/2012  . Essential hypertension, benign 09/02/2012  . Hyperlipidemia 09/02/2012  . Tobacco abuse 09/02/2012  . Abdominal aneurysm without mention of rupture 10/20/2011    Past Surgical History:  Procedure Laterality Date  . ABDOMINAL AORTAGRAM N/A 03/10/2013   Procedure: ABDOMINAL Ronny Flurry;  Surgeon: Sherren Kerns, MD;  Location:  [facility] CATH LAB;  Service: Cardiovascular;  Laterality: N/A;  . ABDOMINAL AORTIC ENDOVASCULAR STENT GRAFT N/A 08/22/2012   Procedure: ABDOMINAL AORTIC ENDOVASCULAR STENT GRAFT;  Surgeon: Sherren Kerns, MD;  Location: [facility] OR;  Service: Vascular;  Laterality: N/A;  . CAROTID ENDARTERECTOMY Left 2007  . CAROTID STENT  2007   left internal carotid artery  . TESTICLE TORSION REDUCTION     age of 58    Allergies Patient has no known allergies.  Family History  Problem Relation Age of Onset  . Cancer Mother        Breast cancer  . Hypertension Mother   . Diabetes Mother   . Diabetes Father     Social History Social History   Tobacco Use  . Smoking status: Current Every Day Smoker    Packs/day: 1.00    Years: 50.00    Pack years: 50.00    Types: Cigarettes  . Smokeless tobacco: Former Neurosurgeon    Types: Snuff  Substance Use Topics  . Alcohol use: No    Alcohol/week: 0.0 standard drinks  . Drug use: No    Review of Systems  Constitutional: No fever/chills Eyes: No visual changes. ENT: No sore throat. Cardiovascular: Positive chest pain and palpitations.  Respiratory: Positive shortness of breath. Gastrointestinal: No abdominal pain.  No nausea, no vomiting.  No diarrhea.  No constipation. Genitourinary: Negative for dysuria. Musculoskeletal: Negative for back pain. Skin: Negative for rash. Neurological: Negative for headaches, focal weakness or numbness.  10-point ROS otherwise negative.  ____________________________________________   PHYSICAL EXAM:  VITAL SIGNS: ED Triage Vitals  Enc Vitals Group     BP 10/03/17 1459 (!) 129/107     Pulse Rate 10/03/17 1456 (!) 182     Resp 10/03/17 1504 (!) 27     Temp 10/03/17 1459 97.8 F (36.6 C)     Temp Source 10/03/17 1459 Oral     SpO2 10/03/17 1456 94 %     Weight 10/03/17 1457 190 lb (86.2 kg)     Pain Score 10/03/17 1456 6   Constitutional: Alert and oriented. Patient appears to be in distress.  Eyes: Conjunctivae  are normal. Head: Atraumatic. Nose: No congestion/rhinnorhea. Mouth/Throat: Mucous membranes are moist. Neck: No stridor.  Cardiovascular: SVT on monitor with rate 180s. Good peripheral circulation. Grossly normal heart sounds.   Respiratory: Normal respiratory effort.  No retractions. Lungs CTAB. Gastrointestinal: Soft and nontender. No distention.  Musculoskeletal: No lower extremity tenderness nor edema. No gross deformities of extremities. Neurologic:  Normal speech and language. No gross focal neurologic deficits are appreciated.  Skin:  Skin is warm, dry and intact. No rash noted.  ____________________________________________   LABS (all labs ordered are listed, but only abnormal results are displayed)  Labs Reviewed  COMPREHENSIVE METABOLIC PANEL - Abnormal; Notable for the following components:      Result Value   Glucose, Bld 146 (*)    BUN 34 (*)    Creatinine, Ser 1.88 (*)    GFR calc non Af Amer 34 (*)    GFR calc Af Amer 40 (*)    All other components within normal limits  CBC WITH DIFFERENTIAL/PLATELET - Abnormal; Notable for the following components:   WBC 11.9 (*)    MCV 102.2 (*)    Neutro Abs 10.6 (*)    All other components within normal limits  I-STAT CHEM 8, ED - Abnormal; Notable for the following components:   BUN 36 (*)    Creatinine, Ser 2.00 (*)    Glucose, Bld 143 (*)    All other components within normal limits  MRSA PCR SCREENING  TROPONIN I  TSH  TROPONIN I  TROPONIN I  TROPONIN I  BASIC METABOLIC PANEL  I-STAT TROPONIN, ED   ____________________________________________  EKG   EKG Interpretation  Date/Time:  Sunday October 03 2017 14:56:18 EDT Ventricular Rate:  181 PR Interval:    QRS Duration: 66 QT Interval:  252 QTC Calculation: 437 R Axis:   -4 Text Interpretation:  Supraventricular tachycardia Nonspecific ST abnormality Abnormal ECG Confirmed by Donnetta Hutching ([id number]) on 10/03/2017 5:12:36 PM       ____________________________________________  RADIOLOGY  Dg Chest 2 View  Result Date: 10/03/2017 CLINICAL DATA:  Shortness of breath EXAM: CHEST - 2 VIEW COMPARISON:  03/16/2017 FINDINGS: The heart size and mediastinal contours are within normal limits. Both lungs are clear. The visualized skeletal structures are unremarkable. IMPRESSION: No active cardiopulmonary disease. Electronically Signed   By: Elige Ko   On: 10/03/2017 17:03    ____________________________________________   PROCEDURES  Procedure(s) performed:   .Critical Care Performed by: Maia Plan, MD Authorized by: Maia Plan, MD   Critical care provider statement:    Critical care time (minutes):  35   Critical care time was exclusive of:  Separately billable procedures and treating other patients and teaching time   Critical care was necessary to treat or prevent imminent or life-threatening deterioration of the following conditions:  Circulatory failure  Critical care was time spent personally by me on the following activities:  Blood draw for specimens, development of treatment plan with patient or surrogate, discussions with consultants, examination of patient, evaluation of patient's response to treatment, obtaining history from patient or surrogate, ordering and performing treatments and interventions, ordering and review of laboratory studies, ordering and review of radiographic studies, pulse oximetry, re-evaluation of patient's condition and review of old charts   I assumed direction of critical care for this patient from another provider in my specialty: no   .Cardioversion Date/Time: 10/03/2017 9:03 PM Performed by: Maia Plan, MD Authorized by: Maia Plan, MD   Consent:    Consent obtained:  Verbal   Consent given by:  Patient   Risks discussed:  Death, induced arrhythmia and pain Pre-procedure details:    Cardioversion basis:  Emergent   Rhythm:  Supraventricular tachycardia    Electrode placement:  Anterior-posterior Patient sedated: No Attempt one:    Cardioversion mode attempt one: Adenosine 6 mg IVP.   Shock outcome:  No change in rhythm Attempt two:    Cardioversion mode attempt two: 12 mg adenosine x 2.   Shock outcome:  No change in rhythm Attempt three:    Cardioversion mode attempt three: Diltiazem 15 mg bolus and infusion    Shock outcome:  Conversion to normal sinus rhythm Post-procedure details:    Patient status:  Awake   Patient tolerance of procedure:  Tolerated well, no immediate complications   ____________________________________________   INITIAL IMPRESSION / ASSESSMENT AND PLAN / ED COURSE  Pertinent labs & imaging results that were available during my care of the patient were reviewed by me and considered in my medical decision making (see chart for details).  Patient presents to the emergency department with chest pain, shortness of breath, palpitations.  He has SVT with rate of 180s to 190s.  Complexes are narrow and regular.  I attempted vagal maneuvers with the patient without success.  He was given adenosine 6 mg which was unsuccessful.  He was given 2 separate doses of 12 mg adenosine with no improvement.  Underlying rhythm seems possibly junctional.  No clear P waves.  Patient was not hypotensive or significantly symptomatic so opted for diltiazem push which converted the patient to initially a bigeminy followed by sinus rhythm.  He was maintained briefly on diltiazem drip but this was discontinued.   04:15 PM Made aware by nursing that patient is back in SVT. He apparently had some coughing and used his home albuterol inh from home and is now back in SVT. Patient given 10 mg Diltiazem and placed back on Dilt infusion. Spoke with Dr. Shirlee Latch. Agrees with plan for dilt drip and pushes as needed overnight. [facility] stay at [facility] with Cards PRN in the AM.   Discussed patient's case with Hospitalist to request admission. Patient and  family (if present) updated with plan. Care transferred to Hospitalist service.  I reviewed all nursing notes, vitals, pertinent old records, EKGs, labs, imaging (as available).  ____________________________________________  FINAL CLINICAL IMPRESSION(S) / ED DIAGNOSES  Final diagnoses:  Sustained SVT (HCC)    MEDICATIONS GIVEN DURING THIS VISIT:  Medications  diltiazem (CARDIZEM) 1 mg/mL load via infusion 15 mg (15 mg Intravenous Bolus from Bag 10/03/17 1530)    And  diltiazem (CARDIZEM) 100 mg in dextrose 5% (1 mg/mL) infusion (10 mg/hr Intravenous Rate/Dose Change 10/03/17 1850)  dilTIAZem HCl-Dextrose 100-5 MG/100ML-% infusion SOLN (  Not Given 10/03/17 1539)  aspirin  EC tablet 81 mg (has no administration in time range)  doxycycline (VIBRA-TABS) tablet 100 mg (has no administration in time range)  lisinopril (PRINIVIL,ZESTRIL) tablet 2.5 mg (2.5 mg Oral Given 10/03/17 1840)  rOPINIRole (REQUIP) tablet 0.5 mg (has no administration in time range)  albuterol (PROVENTIL) (2.5 MG/3ML) 0.083% nebulizer solution 3 mL (has no administration in time range)  mometasone-formoterol (DULERA) 200-5 MCG/ACT inhaler 2 puff (has no administration in time range)  tiotropium (SPIRIVA) inhalation capsule 18 mcg (18 mcg Inhalation Not Given 10/03/17 1841)  acetaminophen (TYLENOL) tablet 650 mg (has no administration in time range)    Or  acetaminophen (TYLENOL) suppository 650 mg (has no administration in time range)  enoxaparin (LOVENOX) injection 40 mg (40 mg Subcutaneous Given 10/03/17 1839)  guaiFENesin (MUCINEX) 12 hr tablet 600 mg (has no administration in time range)  sodium chloride 0.9 % bolus 500 mL (0 mLs Intravenous Stopped 10/03/17 1531)  adenosine (ADENOCARD) 6 MG/2ML injection 12 mg (12 mg Intravenous Given 10/03/17 1518)  adenosine (ADENOCARD) 6 MG/2ML injection 12 mg (12 mg Intravenous Given 10/03/17 1519)     Note:  This document was prepared using Dragon voice recognition software  and may include unintentional dictation errors.  Alona BeneJoshua Long, MD Emergency Medicine    Long, Arlyss RepressJoshua G, MD 10/03/17 2104

## 2017-10-04 DIAGNOSIS — R079 Chest pain, unspecified: Secondary | ICD-10-CM

## 2017-10-04 DIAGNOSIS — J42 Unspecified chronic bronchitis: Secondary | ICD-10-CM

## 2017-10-04 DIAGNOSIS — E7849 Other hyperlipidemia: Secondary | ICD-10-CM | POA: Diagnosis not present

## 2017-10-04 DIAGNOSIS — R748 Abnormal levels of other serum enzymes: Secondary | ICD-10-CM | POA: Diagnosis not present

## 2017-10-04 DIAGNOSIS — N182 Chronic kidney disease, stage 2 (mild): Secondary | ICD-10-CM | POA: Diagnosis not present

## 2017-10-04 DIAGNOSIS — I471 Supraventricular tachycardia: Secondary | ICD-10-CM | POA: Diagnosis not present

## 2017-10-04 DIAGNOSIS — Z720 Tobacco use: Secondary | ICD-10-CM | POA: Diagnosis not present

## 2017-10-04 DIAGNOSIS — I10 Essential (primary) hypertension: Secondary | ICD-10-CM | POA: Diagnosis not present

## 2017-10-04 DIAGNOSIS — J438 Other emphysema: Secondary | ICD-10-CM | POA: Diagnosis not present

## 2017-10-04 LAB — BASIC METABOLIC PANEL
Anion gap: 6 (ref 5–15)
BUN: 31 mg/dL — ABNORMAL HIGH (ref 8–23)
CO2: 25 mmol/L (ref 22–32)
Calcium: 8.8 mg/dL — ABNORMAL LOW (ref 8.9–10.3)
Chloride: 109 mmol/L (ref 98–111)
Creatinine, Ser: 1.51 mg/dL — ABNORMAL HIGH (ref 0.61–1.24)
GFR calc Af Amer: 52 mL/min — ABNORMAL LOW (ref >60)
GFR calc non Af Amer: 45 mL/min — ABNORMAL LOW (ref >60)
Glucose, Bld: 87 mg/dL (ref 70–99)
Potassium: 4.1 mmol/L (ref 3.5–5.1)
Sodium: 140 mmol/L (ref 135–145)

## 2017-10-04 LAB — TROPONIN I
Troponin I: 0.12 ng/mL (ref <0.03)
Troponin I: 0.1 ng/mL (ref <0.03)

## 2017-10-04 LAB — GLUCOSE, CAPILLARY: Glucose-Capillary: 108 mg/dL — ABNORMAL HIGH (ref 70–99)

## 2017-10-04 MED ORDER — DILTIAZEM HCL ER COATED BEADS 240 MG PO CP24
240.0000 mg | ORAL_CAPSULE | Freq: Every day | ORAL | Status: DC
  Administered 2017-10-04: 240 mg via ORAL
  Filled 2017-10-04: qty 1

## 2017-10-04 MED ORDER — DILTIAZEM HCL ER COATED BEADS 240 MG PO CP24
240.0000 mg | ORAL_CAPSULE | Freq: Every day | ORAL | 2 refills | Status: DC
Start: 2017-10-05 — End: 2017-10-11

## 2017-10-04 NOTE — Discharge Summary (Signed)
Physician Discharge Summary  Blake Cherry NFA:[id number] DOB: 29-Oct-1945 DOA: 10/03/2017  PCP: [name], MD  Admit date: 10/03/2017 Discharge date: 10/04/2017  Time spent: 45 minutes  Recommendations for Outpatient Follow-up:  -Will be discharged home today. -Will follow up with cardiology in 1 week for consideration of stress testing.   Discharge Diagnoses:  Principal Problem:   SVT (supraventricular tachycardia) (HCC) Active Problems:   Essential hypertension, benign   Hyperlipidemia   COPD (chronic obstructive pulmonary disease) (HCC)   CKD (chronic kidney disease), stage II   Discharge Condition: Stable and improved  Filed Weights   10/03/17 1457 10/03/17 1821 10/04/17 0500  Weight: 86.2 kg 87.1 kg 87 kg    History of present illness:  As per Dr. Adrian Blackwater on 8/11: Blake Cherry is a 72 y.o. male with a history of abdominal aortic aneurysm, COPD, hypertension, history of stroke in 2007 with no residual deficits, chronic kidney disease stage III.  Patient seen for chest tightness and palpitations that started this afternoon.  He felt his heart beating rapidly, mild shortness of breath, and some chest tightness.  He Checked his pulse oximetry, which showed good oxygenation but heart rate in the 180s.  He then checked his blood pressure which showed an elevated blood pressure with tachycardia in the 190s.  Patient came to the hospital for evaluation.  He was given adenosine x3 doses (6 mg, 12 mg, 12 mg) this did not help.  He was given Cardizem drip, which began to help after a bolus.  His heart rate has been intermittently controlled on the Cardizem, but responds to increases.  No other palliating or provoking factors.  His symptoms are improving.  He is a coffee drinker, but did not drink more coffee than usual.  Hospital Course:   SVT -Finally converted to NSR after cardizem drip. -He has been transitioned over lo long-acting cardizem with HRs in the  90s-100s. -Seen by cardiology who will agrees with DC home today and will follow up as an OP for consideration of stress testing.  Elevated Troponin -Like rate-mediated. -Has 3 vessel calcifications per CT. -Will follow up with cardiology as an OP for stress testing.  HTN -Well controlled. -Continue home medications with the addition of cardizem.  HLD -Continue statin  COPD -not exacerbated. -Continue current symbicort, spiriva, albuterol, budesonide.  Procedures:  None   Consultations:  Cardiology  Discharge Instructions  Discharge Instructions    Diet - low sodium heart healthy   Complete by:  As directed    Increase activity slowly   Complete by:  As directed      Allergies as of 10/04/2017   No Known Allergies     Medication List    STOP taking these medications   predniSONE 10 MG tablet Commonly known as:  DELTASONE     TAKE these medications   albuterol (2.5 MG/3ML) 0.083% nebulizer solution Commonly known as:  PROVENTIL Take 3 mLs (2.5 mg total) by nebulization every 4 (four) hours as needed for wheezing or shortness of breath.   albuterol 108 (90 Base) MCG/ACT inhaler Commonly known as:  PROVENTIL HFA;VENTOLIN HFA INHALE 2 PUFFS BY MOUTH EVERY 6 HOURS AS NEEDED FOR WHEEZING OR SHORTNESS OF BREATH   aspirin EC 81 MG tablet Take 81 mg by mouth daily.   budesonide 0.5 MG/2ML nebulizer solution Commonly known as:  PULMICORT USE 1 VIAL IN NEBULIZER TWICE DAILY   budesonide-formoterol 160-4.5 MCG/ACT inhaler Commonly known as:  SYMBICORT Inhale  2 puffs into the lungs 2 (two) times daily.   diltiazem 240 MG 24 hr capsule Commonly known as:  CARDIZEM CD Take 1 capsule (240 mg total) by mouth daily. Start taking on:  10/05/2017   doxycycline 100 MG capsule Commonly known as:  VIBRAMYCIN Take 100 mg by mouth 2 (two) times daily. Starting 09/28/2017 x 10 days.   guaiFENesin 600 MG 12 hr tablet Commonly known as:  MUCINEX Take 1 tablet (600 mg  total) by mouth 2 (two) times daily.   lisinopril 2.5 MG tablet Commonly known as:  PRINIVIL,ZESTRIL Take 1 tablet (2.5 mg total) by mouth daily.   OCUVITE PRESERVISION PO Take 1 capsule by mouth daily.   rOPINIRole 0.5 MG tablet Commonly known as:  REQUIP Take 1 tablet (0.5 mg total) by mouth 3 (three) times daily. What changed:  when to take this   tiotropium 18 MCG inhalation capsule Commonly known as:  SPIRIVA PLACE 1 CAPSULE INTO INHALER AND INHALE DAILY      No Known Allergies Follow-up Information    Babs SciaraLuking, Scott A, MD. Schedule an appointment as soon as possible for a visit in 2 week(s).   Specialty:  Family Medicine Contact information: [address] [place] [address] [phone]        Laqueta LindenKoneswaran, Suresh A, MD. Schedule an appointment as soon as possible for a visit in 1 week(s).   Specialty:  Cardiology Contact information: [address]  [place] [address] [phone]            The results of significant diagnostics from this hospitalization (including imaging, microbiology, ancillary and laboratory) are listed below for reference.    Significant Diagnostic Studies: Dg Chest 2 View  Result Date: 10/03/2017 CLINICAL DATA:  Shortness of breath EXAM: CHEST - 2 VIEW COMPARISON:  03/16/2017 FINDINGS: The heart size and mediastinal contours are within normal limits. Both lungs are clear. The visualized skeletal structures are unremarkable. IMPRESSION: No active cardiopulmonary disease. Electronically Signed   By: Elige KoHetal  Patel   On: 10/03/2017 17:03    Microbiology: Recent Results (from the past 240 hour(s))  MRSA PCR Screening     Status: None   Collection Time: 10/03/17  6:14 PM  Result Value Ref Range Status   MRSA by PCR NEGATIVE NEGATIVE Final    Comment:        The GeneXpert MRSA Assay (FDA approved for NASAL specimens only), is one component of a comprehensive MRSA colonization surveillance program. It is not intended to  diagnose MRSA infection nor to guide or monitor treatment for MRSA infections. Performed at [facility], [address] [place], [place] [address]      Labs: Basic Metabolic Panel: Recent Labs  Lab 10/03/17 1515 10/03/17 1523 10/04/17 0618  NA 142 141 140  K 4.6 4.6 4.1  CL 105 106 109  CO2 26  --  25  GLUCOSE 146* 143* 87  BUN 34* 36* 31*  CREATININE 1.88* 2.00* 1.51*  CALCIUM 9.6  --  8.8*   Liver Function Tests: Recent Labs  Lab 10/03/17 1515  AST 21  ALT 11  ALKPHOS 52  BILITOT 0.8  PROT 7.4  ALBUMIN 4.3   No results for input(s): LIPASE, AMYLASE in the last 168 hours. No results for input(s): AMMONIA in the last 168 hours. CBC: Recent Labs  Lab 10/03/17 1515 10/03/17 1523  WBC 11.9*  --   NEUTROABS 10.6*  --   HGB 15.2 15.6  HCT 46.9 46.0  MCV 102.2*  --   PLT 327  --    Cardiac Enzymes: Recent Labs  Lab 10/03/17 1515 10/03/17 1902 10/03/17 2305 10/04/17 0618  TROPONINI <0.03 0.05* 0.12* 0.10*   BNP: BNP (last 3 results) No results for input(s): BNP in the last 8760 hours.  ProBNP (last 3 results) No results for input(s): PROBNP in the last 8760 hours.  CBG: Recent Labs  Lab 10/04/17 0825  GLUCAP 108*       Signed:  Chaya JanEstela Hernandez Acosta  [facility] Pager: [phone] 10/04/2017, 10:30 AM

## 2017-10-04 NOTE — Progress Notes (Signed)
Critical troponin 0.12 reported to Dr. Antionette Charpyd.

## 2017-10-04 NOTE — Progress Notes (Signed)
Patient alert and oriented x4. No complaints of pain, shortness of breath, chest pain, dizziness, nausea or vomiting. Patient up out of bed, ambulatory in room independently with steady gait. Patient tolerated breakfast tray well. Discharge instructions, appointment times, dates, locations and orders when to schedule them were gone over with patient and his wife. Education on medications including starting Cardizem tomorrow morning and stopping prednisone per orders gone over with patient and wife. Both expressed full understanding of all the discharge instructions and education information along with when to take all medications. Patient IV discontinued without complications. Patient discharged home with wife via car.

## 2017-10-04 NOTE — Consult Note (Addendum)
Cardiology Consultation:   Patient ID: Blake Cherry; [id number]; May 29, 1945   Admit date: 10/03/2017 Date of Consult: 10/04/2017  Primary Care Provider: [name], MD Primary Cardiologist: Prentice Docker, MD  Primary Electrophysiologist: N/A   Patient Profile:   Blake Cherry is a 72 y.o. male with a hx of hypertension, AAA repair who is being seen today for the evaluation of SVT at the request of Dr. Adrian Blackwater.  History of Present Illness:   Blake Cherry is a 72 year old male patient with history of hypertension, for renal AAA repair in 2014, COPD, CKD stage II, HLD obesity.  He was seen by Dr. Dietrich Pates in 2014 for preoperative clearance before undergoing AAA repair.  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  Patient was admitted yesterday with chest tightness and palpitations and found to be in SVT at 190 bpm.  He was given adenosine x3 doses that did not help.  Heart rate came down with Cardizem drip.  Troponin 0 0.05, 0.12, 0.10, creatinine 1.51, TSH normal.  Patient says he was watching TV and developed chest tightness and left arm numbness. He checked his pulse ox and HR was 190 so came to ER. Very sedentary but tries to walk some every week.Quit walking regularly a year ago when COPD got so bad. Never had chest tightness before yest. Still smoking 1-2 cigs/day.   Past Medical History:  Diagnosis Date  . AAA (abdominal aortic aneurysm) (HCC)   . Carotid artery occlusion 2007   Left cea  . COPD (chronic obstructive pulmonary disease) (HCC)   . Diverticulitis    last time 4 years ago  . Hypertension    per Dr Tenny Craw  . Shortness of breath    with exertion  . Stroke [facility]) 05/2005    2 in 2007.  Weakness and numbness in left arm and leg    Past Surgical History:  Procedure Laterality Date  . ABDOMINAL AORTAGRAM N/A 03/10/2013   Procedure: ABDOMINAL Ronny Flurry;   Surgeon: Sherren Kerns, MD;  Location: [facility] CATH LAB;  Service: Cardiovascular;  Laterality: N/A;  . ABDOMINAL AORTIC ENDOVASCULAR STENT GRAFT N/A 08/22/2012   Procedure: ABDOMINAL AORTIC ENDOVASCULAR STENT GRAFT;  Surgeon: Sherren Kerns, MD;  Location: [facility] OR;  Service: Vascular;  Laterality: N/A;  . CAROTID ENDARTERECTOMY Left 2007  . CAROTID STENT  2007   left internal carotid artery  . TESTICLE TORSION REDUCTION     age of 6     Home Medications:  Prior to Admission medications   Medication Sig Start Date End Date Taking? Authorizing Provider  albuterol (PROVENTIL HFA;VENTOLIN HFA) 108 (90 Base) MCG/ACT inhaler INHALE 2 PUFFS BY MOUTH EVERY 6 HOURS AS NEEDED FOR WHEEZING OR SHORTNESS OF BREATH 05/14/17  Yes Luking, Scott A, MD  albuterol (PROVENTIL) (2.5 MG/3ML) 0.083% nebulizer solution Take 3 mLs (2.5 mg total) by nebulization every 4 (four) hours as needed for wheezing or shortness of breath. 05/03/17  Yes [name], MD  aspirin EC 81 MG tablet Take 81 mg by mouth daily.   Yes [provider]  budesonide (PULMICORT) 0.5 MG/2ML nebulizer solution USE 1 VIAL IN NEBULIZER TWICE DAILY 05/03/17  Yes Luking, Jonna Coup, MD  budesonide-formoterol (SYMBICORT) 160-4.5 MCG/ACT inhaler Inhale 2 puffs into the lungs 2 (two) times daily. 05/03/17  Yes [name], MD  doxycycline (VIBRAMYCIN) 100 MG capsule Take 100 mg by  mouth 2 (two) times daily. Starting 09/28/2017 x 10 days.   Yes [provider]  guaiFENesin (MUCINEX) 600 MG 12 hr tablet Take 1 tablet (600 mg total) by mouth 2 (two) times daily. 09/19/14  Yes Erick Blinks, MD  lisinopril (PRINIVIL,ZESTRIL) 2.5 MG tablet Take 1 tablet (2.5 mg total) by mouth daily. 05/03/17  Yes Luking, Jonna Coup, MD  Multiple Vitamins-Minerals (OCUVITE PRESERVISION PO) Take 1 capsule by mouth daily.    Yes [provider]  predniSONE (DELTASONE) 10 MG tablet Take 10-30 mg by mouth See admin instructions. 30 mg x 3 days, 20 mg x 3  days, 10 mg x 3 days. 09/27/17  Yes [provider]  rOPINIRole (REQUIP) 0.5 MG tablet Take 1 tablet (0.5 mg total) by mouth 3 (three) times daily. Patient taking differently: Take 0.5 mg by mouth 2 (two) times daily.  08/24/17  Yes [name], MD  tiotropium (SPIRIVA HANDIHALER) 18 MCG inhalation capsule PLACE 1 CAPSULE INTO INHALER AND INHALE DAILY 05/03/17  Yes [name], MD    Inpatient Medications: Scheduled Meds: . aspirin EC  81 mg Oral Daily  . doxycycline  100 mg Oral BID  . enoxaparin (LOVENOX) injection  40 mg Subcutaneous Q24H  . lisinopril  2.5 mg Oral Daily  . mometasone-formoterol  2 puff Inhalation BID  . rOPINIRole  0.5 mg Oral TID  . tiotropium  18 mcg Inhalation Daily   Continuous Infusions: . diltiazem (CARDIZEM) infusion 10 mg/hr (10/03/17 2156)   PRN Meds: acetaminophen **OR** acetaminophen, albuterol, guaiFENesin  Allergies:   No Known Allergies  Social History:   Social History   Socioeconomic History  . Marital status: Married    Spouse name: Not on file  . Number of children: Not on file  . Years of education: Not on file  . Highest education level: Not on file  Occupational History  . Not on file  Social Needs  . Financial resource strain: Not on file  . Food insecurity:    Worry: Not on file    Inability: Not on file  . Transportation needs:    Medical: Not on file    Non-medical: Not on file  Tobacco Use  . Smoking status: Current Every Day Smoker    Packs/day: 1.00    Years: 50.00    Pack years: 50.00    Types: Cigarettes  . Smokeless tobacco: Former Neurosurgeon    Types: Snuff  Substance and Sexual Activity  . Alcohol use: No    Alcohol/week: 0.0 standard drinks  . Drug use: No  . Sexual activity: Not on file  Lifestyle  . Physical activity:    Days per week: Not on file    Minutes per session: Not on file  . Stress: Not on file  Relationships  . Social connections:    Talks on phone: Not on file    Gets together:  Not on file    Attends religious service: Not on file    Active member of club or organization: Not on file    Attends meetings of clubs or organizations: Not on file    Relationship status: Not on file  . Intimate partner violence:    Fear of current or ex partner: Not on file    Emotionally abused: Not on file    Physically abused: Not on file    Forced sexual activity: Not on file  Other Topics Concern  . Not on file  Social History Narrative  .  Not on file    Family History:    Family History  Problem Relation Age of Onset  . Cancer Mother        Breast cancer  . Hypertension Mother   . Diabetes Mother   . Diabetes Father      ROS:  Please see the history of present illness.  Review of Systems  Constitution: Negative.  HENT: Negative.   Cardiovascular: Positive for dyspnea on exertion.  Respiratory: Positive for shortness of breath.   Endocrine: Negative.   Hematologic/Lymphatic: Negative.   Musculoskeletal: Negative.   Gastrointestinal: Negative.   Genitourinary: Negative.   Neurological: Negative.     All other ROS reviewed and negative.     Physical Exam/Data:   Vitals:   10/04/17 0545 10/04/17 0600 10/04/17 0615 10/04/17 0700  BP: 114/65 123/72 105/62 122/71  Pulse: 99 99 100 100  Resp: 20 19 17  (!) 28  Temp:      TempSrc:      SpO2: 93% 94% 91% 91%  Weight:      Height:        Intake/Output Summary (Last 24 hours) at 10/04/2017 0754 Last data filed at 10/04/2017 0659 Gross per 24 hour  Intake 1483.33 ml  Output 825 ml  Net 658.33 ml   Filed Weights   10/03/17 1457 10/03/17 1821 10/04/17 0500  Weight: 86.2 kg 87.1 kg 87 kg   Body mass index is 29.16 kg/m.  General:  Obese, in no acute distress HEENT: normal Lymph: no adenopathy Neck: no JVD Endocrine:  No thryomegaly Vascular: No carotid bruits; FA pulses 2+ bilaterally without bruits  Cardiac:  Distant heart sounds,normal S1, S2; RRR; no murmur   Lungs:  Decreased breath sounds  throughout without wheezing or rhonchi Abd: soft, nontender, no hepatomegaly  Ext: no edema Musculoskeletal:  No deformities, BUE and BLE strength normal and equal Skin: warm and dry  Neuro:  CNs 2-12 intact, no focal abnormalities noted Psych:  Normal affect   EKG:  The EKG was personally reviewed and demonstrates: Yesterday SVT at 181 bpm poor R wave progression anteriorly follow-up EKG converted to normal sinus rhythm poor R wave progression anteriorly unchanged from 2016 Telemetry:  Telemetry was personally reviewed and demonstrates:  NSR with PVC's ? Run Blake Cherry but think it's artifact  Relevant CV Studies: 2D echo 2016Study Conclusions   - Left ventricle: The cavity size was normal. Wall thickness was   increased in a pattern of mild LVH. Systolic function was normal.   The estimated ejection fraction was in the range of 55% to 60%.   Indeterminant diastolic dysfunction. Wall motion was normal;   there were no regional wall motion abnormalities. - Aortic valve: Mildly calcified annulus. Mildly thickened   leaflets. Valve area (VTI): 3.31 cm^2. Valve area (Vmax): 3.16   cm^2. - Mitral valve: Mildly calcified annulus. Mildly thickened leaflets   . - Atrial septum: There was increased thickness of the septum,   consistent with lipomatous hypertrophy. No defect or patent   foramen ovale was identified. - Technically difficult study.     Laboratory Data:  Chemistry Recent Labs  Lab 10/03/17 1515 10/03/17 1523 10/04/17 0618  NA 142 141 140  K 4.6 4.6 4.1  CL 105 106 109  CO2 26  --  25  GLUCOSE 146* 143* 87  BUN 34* 36* 31*  CREATININE 1.88* 2.00* 1.51*  CALCIUM 9.6  --  8.8*  GFRNONAA 34*  --  45*  GFRAA 40*  --  52*  ANIONGAP 11  --  6    Recent Labs  Lab 10/03/17 1515  PROT 7.4  ALBUMIN 4.3  AST 21  ALT 11  ALKPHOS 52  BILITOT 0.8   Hematology Recent Labs  Lab 10/03/17 1515 10/03/17 1523  WBC 11.9*  --   RBC 4.59  --   HGB 15.2 15.6  HCT 46.9 46.0    MCV 102.2*  --   MCH 33.1  --   MCHC 32.4  --   RDW 13.9  --   PLT 327  --    Cardiac Enzymes Recent Labs  Lab 10/03/17 1515 10/03/17 1902 10/03/17 2305 10/04/17 0618  TROPONINI <0.03 0.05* 0.12* 0.10*    Recent Labs  Lab 10/03/17 1520  TROPIPOC 0.01    BNPNo results for input(s): BNP, PROBNP in the last 168 hours.  DDimer No results for input(s): DDIMER in the last 168 hours.  Radiology/Studies:  Dg Chest 2 View  Result Date: 10/03/2017 CLINICAL DATA:  Shortness of breath EXAM: CHEST - 2 VIEW COMPARISON:  03/16/2017 FINDINGS: The heart size and mediastinal contours are within normal limits. Both lungs are clear. The visualized skeletal structures are unremarkable. IMPRESSION: No active cardiopulmonary disease. Electronically Signed   By: Elige KoHetal  Patel   On: 10/03/2017 17:03    Assessment and Plan:   1. SVT converted to normal sinus rhythm with IV diltiazem-will switch to po now. 2. Chest pain & Elevated troponins flat most likely demand ischemia in setting of SVT but multiple CV risk factors so will need outpatient lexiscan 3. Hypertension controlled with lisinopril at home 4. Infrarenal AAA repair 2014 5. Hyperlipidemia-not on treatment. LDL 86 10/2016 6. COPD-still smoking 7. Obesity-weight loss recommended.   For questions or updates, please contact [facility] Please consult [email or web address] for contact info under Cardiology/STEMI.   Signed, Jacolyn ReedyMichele Lenze, PA-C  10/04/2017 7:54 AM   The patient was seen and examined, and I agree with the history, physical exam, assessment and plan as documented above, with modifications as noted below. I have also personally reviewed all relevant documentation, old records, labs, and both radiographic and cardiovascular studies. I have also independently interpreted old and new ECG's.  Briefly, this is a 72 yr old male with h/o AAA repair, COPD, CKD stage II who was admitted yesterday with tachycardia and chest pain which  occurred spontaneously while watching TV. He was found to be in SVT 181 bpm by ECG. He was given several doses of adenosine and then placed on diltiazem drip and subsequently converted to sinus rhythm. He is having PAC's and PVC's but no sustained arrhythmias. He had nonspecific troponin elevation. He is presently asymptomatic and eager to go home. He denies chest pains over the last several months. He leads a sedentary life.   Will switch to long-acting diltiazem. I discussed the possibility of ablation with the patient and his wife should medical therapy fail.  He did have 3-vessel coronary atherosclerosis by chest CT on 03/16/17. An outpatient nuclear stress test can be pursued.   Prentice DockerSuresh Pedro Whiters, MD, [facility]bornFACC  10/04/2017 9:06 AM

## 2017-10-11 ENCOUNTER — Ambulatory Visit (INDEPENDENT_AMBULATORY_CARE_PROVIDER_SITE_OTHER): Payer: PPO | Admitting: Family Medicine

## 2017-10-11 ENCOUNTER — Encounter: Payer: Self-pay | Admitting: Family Medicine

## 2017-10-11 VITALS — BP 150/70 | Ht 67.0 in | Wt 197.0 lb

## 2017-10-11 DIAGNOSIS — N182 Chronic kidney disease, stage 2 (mild): Secondary | ICD-10-CM | POA: Diagnosis not present

## 2017-10-11 DIAGNOSIS — Z125 Encounter for screening for malignant neoplasm of prostate: Secondary | ICD-10-CM | POA: Diagnosis not present

## 2017-10-11 DIAGNOSIS — I10 Essential (primary) hypertension: Secondary | ICD-10-CM

## 2017-10-11 DIAGNOSIS — E7849 Other hyperlipidemia: Secondary | ICD-10-CM

## 2017-10-11 MED ORDER — DILTIAZEM HCL ER COATED BEADS 240 MG PO CP24: 240.0000 mg | ORAL_CAPSULE | Freq: Every day | ORAL | 2 refills | Status: DC

## 2017-10-11 MED ORDER — ROPINIROLE HCL 0.5 MG PO TABS: 0.5000 mg | ORAL_TABLET | Freq: Two times a day (BID) | ORAL | 6 refills | Status: DC

## 2017-10-11 NOTE — Progress Notes (Signed)
   Subjective:    Patient ID: Blake Cherry, male    DOB: 08/22/1945, 72 y.o.   MRN: [id number]  HPI  Patient is here today to follow up after hospital stay.He states his pulse was raging and bop was elevated. This occurred Sunday before last on 08/11/219. He states he is feeling better. Patient is here today for follow-up from the hospital Patient was having significant problems with tachycardia Was having SVT Was treated with IV Cardizem with success Patient now doing better Still has COPD and breathing issues associated with this but denies any type of fast heart rate currently Denies any type of chest pressure tightness pain or tachycardia currently Shortness of breath consistent with his COPD Review of Systems  Constitutional: Negative for activity change, fatigue and fever.  HENT: Negative for congestion and rhinorrhea.   Respiratory: Negative for cough and shortness of breath.   Cardiovascular: Negative for chest pain and leg swelling.  Gastrointestinal: Negative for abdominal pain, diarrhea and nausea.  Genitourinary: Negative for dysuria and hematuria.  Neurological: Negative for weakness and headaches.  Psychiatric/Behavioral: Negative for agitation and behavioral problems.       Objective:   Physical Exam  Constitutional: He appears well-nourished. No distress.  HENT:  Head: Normocephalic and atraumatic.  Eyes: Right eye exhibits no discharge. Left eye exhibits no discharge.  Neck: No tracheal deviation present.  Cardiovascular: Normal rate, regular rhythm and normal heart sounds.  No murmur heard. Pulmonary/Chest: Effort normal and breath sounds normal. No respiratory distress. He has no wheezes.  Musculoskeletal: He exhibits no edema.  Lymphadenopathy:    He has no cervical adenopathy.  Neurological: He is alert.  Skin: Skin is warm. No rash noted.  Psychiatric: His behavior is normal.  Vitals reviewed.         Assessment & Plan:  Transitional  care SVT Under good control now Has appointment coming up with cardiology They will pursue possible stress test  Patient has wellness exam in September PSA before that exam Patient will do lab work before his next visit Wellness exam in Septemberwellness in sept Labs before next visit Renal insufficiency avoid anti-inflammatories recheck metabolic 7

## 2017-10-27 DIAGNOSIS — R0789 Other chest pain: Secondary | ICD-10-CM

## 2017-10-27 DIAGNOSIS — R079 Chest pain, unspecified: Secondary | ICD-10-CM | POA: Insufficient documentation

## 2017-10-27 DIAGNOSIS — Z125 Encounter for screening for malignant neoplasm of prostate: Secondary | ICD-10-CM | POA: Diagnosis not present

## 2017-10-27 DIAGNOSIS — E7849 Other hyperlipidemia: Secondary | ICD-10-CM | POA: Diagnosis not present

## 2017-10-27 DIAGNOSIS — I10 Essential (primary) hypertension: Secondary | ICD-10-CM | POA: Diagnosis not present

## 2017-10-27 NOTE — Progress Notes (Signed)
Cardiology Office Note    Date:  11/01/2017   ID:  Blake Cherry, DOB 06-23-1945, MRN [id number]  PCP:  [name], MD  Cardiologist: Prentice Docker, MD  Chief Complaint  Patient presents with  . Hospitalization Follow-up    History of Present Illness:  Blake Cherry is a 72 y.o. male with history of hypertension, AAA repair in 2014, COPD, CKD stage II, HLD, obesity.  Patient saw Dr. Dietrich Pates in 2014 preop for AAA.  2D echo showed normal LVEF 55 to 60% with mild LVH and increased thickness of the atrial septum consistent with lipomatous hypertrophy.  Patient was seen in consult 10/04/2017 for chest pain palpitations and found to be in SVT at 190 bpm.  He was given adenosine for 3 doses and did not convert.  Heart rate came down with Cardizem drip.  Troponins were flat.  TSH normal.  Chest pain and elevated troponins that were flat were most likely demand ischemia in the setting of SVT.  Three-vessel coronary atherosclerosis on chest CT 03/16/2017. with multiple CV risk factors plan was outpatient Lexiscan.  Smoking cessation advised.  Patient comes in today accompanied by his wife.  He has had no further rapid heartbeats or chest pain.  He quit smoking cigarettes but continues to smoke his pipe once or twice a day.  He drinks 3 to 4 cups of coffee half caffeine but also a lot of tea all day long.  He was also on steroids at the time of the SVT which was stopped.  Does not exercise regularly because of the COPD.    Past Medical History:  Diagnosis Date  . AAA (abdominal aortic aneurysm) (HCC)   . Carotid artery occlusion 2007   Left cea  . COPD (chronic obstructive pulmonary disease) (HCC)   . Diverticulitis    last time 4 years ago  . Hypertension    per Dr Tenny Craw  . Shortness of breath    with exertion  . Stroke [facility]) 05/2005    2 in 2007.  Weakness and numbness in left arm and leg    Past Surgical History:  Procedure Laterality Date  . ABDOMINAL AORTAGRAM N/A  03/10/2013   Procedure: ABDOMINAL Ronny Flurry;  Surgeon: Sherren Kerns, MD;  Location: [facility] CATH LAB;  Service: Cardiovascular;  Laterality: N/A;  . ABDOMINAL AORTIC ENDOVASCULAR STENT GRAFT N/A 08/22/2012   Procedure: ABDOMINAL AORTIC ENDOVASCULAR STENT GRAFT;  Surgeon: Sherren Kerns, MD;  Location: [facility] OR;  Service: Vascular;  Laterality: N/A;  . CAROTID ENDARTERECTOMY Left 2007  . CAROTID STENT  2007   left internal carotid artery  . TESTICLE TORSION REDUCTION     age of 50    Current Medications: Current Meds  Medication Sig  . albuterol (PROVENTIL HFA;VENTOLIN HFA) 108 (90 Base) MCG/ACT inhaler INHALE 2 PUFFS BY MOUTH EVERY 6 HOURS AS NEEDED FOR WHEEZING OR SHORTNESS OF BREATH  . albuterol (PROVENTIL) (2.5 MG/3ML) 0.083% nebulizer solution Take 3 mLs (2.5 mg total) by nebulization every 4 (four) hours as needed for wheezing or shortness of breath.  Marland Kitchen aspirin EC 81 MG tablet Take 81 mg by mouth daily.  . budesonide (PULMICORT) 0.5 MG/2ML nebulizer solution USE 1 VIAL IN NEBULIZER TWICE DAILY  . budesonide-formoterol (SYMBICORT) 160-4.5 MCG/ACT inhaler Inhale 2 puffs into the lungs 2 (two) times daily.  Marland Kitchen diltiazem (CARDIZEM CD) 240 MG 24 hr capsule Take 1 capsule (240 mg total) by mouth daily.  Marland Kitchen guaiFENesin (MUCINEX) 600 MG 12  hr tablet Take 1 tablet (600 mg total) by mouth 2 (two) times daily.  Marland Kitchen lisinopril (PRINIVIL,ZESTRIL) 2.5 MG tablet Take 1 tablet (2.5 mg total) by mouth daily.  . Multiple Vitamins-Minerals (OCUVITE PRESERVISION PO) Take 1 capsule by mouth daily.   Marland Kitchen rOPINIRole (REQUIP) 0.5 MG tablet Take 1 tablet (0.5 mg total) by mouth 2 (two) times daily.  . [DISCONTINUED] tiotropium (SPIRIVA HANDIHALER) 18 MCG inhalation capsule PLACE 1 CAPSULE INTO INHALER AND INHALE DAILY     Allergies:   Patient has no known allergies.   Social History   Socioeconomic History  . Marital status: Married    Spouse name: Not on file  . Number of children: Not on file  . Years of  education: Not on file  . Highest education level: Not on file  Occupational History  . Not on file  Social Needs  . Financial resource strain: Not on file  . Food insecurity:    Worry: Not on file    Inability: Not on file  . Transportation needs:    Medical: Not on file    Non-medical: Not on file  Tobacco Use  . Smoking status: Former Smoker    Packs/day: 1.00    Years: 50.00    Pack years: 50.00    Types: Cigarettes    Last attempt to quit: 10/01/2017    Years since quitting: 0.0  . Smokeless tobacco: Former Neurosurgeon    Types: Snuff  Substance and Sexual Activity  . Alcohol use: No    Alcohol/week: 0.0 standard drinks  . Drug use: No  . Sexual activity: Not on file  Lifestyle  . Physical activity:    Days per week: Not on file    Minutes per session: Not on file  . Stress: Not on file  Relationships  . Social connections:    Talks on phone: Not on file    Gets together: Not on file    Attends religious service: Not on file    Active member of club or organization: Not on file    Attends meetings of clubs or organizations: Not on file    Relationship status: Not on file  Other Topics Concern  . Not on file  Social History Narrative  . Not on file     Family History:  The patient's family history includes Cancer in his mother; Diabetes in his father and mother; Hypertension in his mother.   ROS:   Please see the history of present illness.    Review of Systems  Constitution: Negative.  HENT: Negative.   Cardiovascular: Positive for dyspnea on exertion.  Respiratory: Positive for shortness of breath and wheezing.   Endocrine: Negative.   Hematologic/Lymphatic: Negative.   Musculoskeletal: Negative.   Gastrointestinal: Negative.   Genitourinary: Negative.   Neurological: Negative.    All other systems reviewed and are negative.   PHYSICAL EXAM:   VS:  BP 124/64 (BP Location: Right Arm)   Pulse 84   Ht 5\' 9"  (1.753 m)   Wt 194 lb (88 kg)   SpO2 94%   BMI  28.65 kg/m   Physical Exam  GEN: Obese, in no acute distress  Neck: no JVD, carotid bruits, or masses Cardiac:RRR; no murmurs, rubs, or gallops  Respiratory: Decreased breath sounds throughout without wheezing or rhonchi GI: soft, nontender, nondistended, + BS Ext: without cyanosis, clubbing, or edema, Good distal pulses bilaterally Neuro:  Alert and Oriented x 3 Psych: euthymic mood, full affect  Wt Readings  from Last 3 Encounters:  11/01/17 194 lb (88 kg)  10/11/17 197 lb (89.4 kg)  10/04/17 191 lb 12.8 oz (87 kg)      Studies/Labs Reviewed:   EKG:  EKG is not ordered today.   Recent Labs: 10/03/2017: ALT 11; Hemoglobin 15.6; Platelets 327; TSH 1.180 10/27/2017: BUN 26; Creatinine, Ser 1.65; Potassium 4.8; Sodium 141   Lipid Panel    Component Value Date/Time   CHOL 146 10/27/2017 0841   TRIG 63 10/27/2017 0841   HDL 45 10/27/2017 0841   CHOLHDL 3.2 10/27/2017 0841   CHOLHDL 3.6 10/27/2013 0731   VLDL 24 10/27/2013 0731   LDLCALC 88 10/27/2017 0841    Additional studies/ records that were reviewed today include:    2D echo 2016Study Conclusions   - Left ventricle: The cavity size was normal. Wall thickness was   increased in a pattern of mild LVH. Systolic function was normal.   The estimated ejection fraction was in the range of 55% to 60%.   Indeterminant diastolic dysfunction. Wall motion was normal;   there were no regional wall motion abnormalities. - Aortic valve: Mildly calcified annulus. Mildly thickened   leaflets. Valve area (VTI): 3.31 cm^2. Valve area (Vmax): 3.16   cm^2. - Mitral valve: Mildly calcified annulus. Mildly thickened leaflets   . - Atrial septum: There was increased thickness of the septum,   consistent with lipomatous hypertrophy. No defect or patent   foramen ovale was identified. - Technically difficult study.       ASSESSMENT:    1. SVT (supraventricular tachycardia) (HCC)   2. Chest pain of uncertain etiology   3. Mixed  hyperlipidemia   4. Class 2 severe obesity due to excess calories with serious comorbidity in adult, unspecified BMI (HCC)   5. Tobacco abuse   6. CKD (chronic kidney disease), stage II   7. Chest pain, unspecified type      PLAN:  In order of problems listed above:  SVT with rates up to 190 converted to normal sinus rhythm with diltiazem-no further problems at home.  Recommend he decrease his caffeine intake and avoid decongestants.  Chest pain in the setting of SVT with multiple CV risk factors and coronary atherosclerosis on prior CT 02/2017.  Recommended outpatient stress testing-we will order Lexiscan Myoview.  He does have COPD but is not wheezing today.  Obesity-weight loss program recommended  Tobacco abuse-stopped cigarettes but still smoking a pipe  CKD stage II creatinine 1.51 10/04/2017  AAA repair 2014  COPD  Advised to quit smoking a pipe.    Medication Adjustments/Labs and Tests Ordered: Current medicines are reviewed at length with the patient today.  Concerns regarding medicines are outlined above.  Medication changes, Labs and Tests ordered today are listed in the Patient Instructions below. Patient Instructions  Medication Instructions:  Your physician recommends that you continue on your current medications as directed. Please refer to the Current Medication list given to you today.   Labwork: NONE  Testing/Procedures: Your physician has requested that you have a lexiscan myoview. For further information please visit [email or web address] Please follow instruction sheet, as given.   Follow-Up: Your physician recommends that you schedule a follow-up appointment in: December with Dr. Purvis Sheffield.    Any Other Special Instructions Will Be Listed Below (If Applicable). Decrease Caffeine intake      If you need a refill on your cardiac medications before your next appointment, please call your pharmacy.  Thank you for choosing [facility]!  Low-Sodium Eating Plan Sodium, which is an element that makes up salt, helps you maintain a healthy balance of fluids in your body. Too much sodium can increase your blood pressure and cause fluid and waste to be held in your body. Your health care provider or dietitian may recommend following this plan if you have high blood pressure (hypertension), kidney disease, liver disease, or heart failure. Eating less sodium can help lower your blood pressure, reduce swelling, and protect your heart, liver, and kidneys. What are tips for following this plan? General guidelines  Most people on this plan should limit their sodium intake to 1,500-2,000 mg (milligrams) of sodium each day. Reading food labels  The Nutrition Facts label lists the amount of sodium in one serving of the food. If you eat more than one serving, you must multiply the listed amount of sodium by the number of servings.  Choose foods with less than 140 mg of sodium per serving.  Avoid foods with 300 mg of sodium or more per serving. Shopping  Look for lower-sodium products, often labeled as "low-sodium" or "no salt added."  Always check the sodium content even if foods are labeled as "unsalted" or "no salt added".  Buy fresh foods. ? Avoid canned foods and premade or frozen meals. ? Avoid canned, cured, or processed meats  Buy breads that have less than 80 mg of sodium per slice. Cooking  Eat more home-cooked food and less restaurant, buffet, and fast food.  Avoid adding salt when cooking. Use salt-free seasonings or herbs instead of table salt or sea salt. Check with your health care provider or pharmacist before using salt substitutes.  Cook with plant-based oils, such as canola, sunflower, or olive oil. Meal planning  When eating at a restaurant, ask that your food be prepared with less salt or no salt, if possible.  Avoid foods that contain MSG (monosodium glutamate). MSG is sometimes added to  [place] food, bouillon, and some canned foods. What foods are recommended? The items listed may not be a complete list. Talk with your dietitian about what dietary choices are best for you. Grains Low-sodium cereals, including oats, puffed wheat and rice, and shredded wheat. Low-sodium crackers. Unsalted rice. Unsalted pasta. Low-sodium bread. Whole-grain breads and whole-grain pasta. Vegetables Fresh or frozen vegetables. "No salt added" canned vegetables. "No salt added" tomato sauce and paste. Low-sodium or reduced-sodium tomato and vegetable juice. Fruits Fresh, frozen, or canned fruit. Fruit juice. Meats and other protein foods Fresh or frozen (no salt added) meat, poultry, seafood, and fish. Low-sodium canned tuna and salmon. Unsalted nuts. Dried peas, beans, and lentils without added salt. Unsalted canned beans. Eggs. Unsalted nut butters. Dairy Milk. Soy milk. Cheese that is naturally low in sodium, such as ricotta cheese, fresh mozzarella, or Swiss cheese Low-sodium or reduced-sodium cheese. Cream cheese. Yogurt. Fats and oils Unsalted butter. Unsalted margarine with no trans fat. Vegetable oils such as canola or olive oils. Seasonings and other foods Fresh and dried herbs and spices. Salt-free seasonings. Low-sodium mustard and ketchup. Sodium-free salad dressing. Sodium-free light mayonnaise. Fresh or refrigerated horseradish. Lemon juice. Vinegar. Homemade, reduced-sodium, or low-sodium soups. Unsalted popcorn and pretzels. Low-salt or salt-free chips. What foods are not recommended? The items listed may not be a complete list. Talk with your dietitian about what dietary choices are best for you. Grains Instant hot cereals. Bread stuffing, pancake, and biscuit mixes. Croutons. Seasoned rice or pasta mixes. Noodle soup cups. Boxed or frozen macaroni and cheese. Regular salted crackers.  Self-rising flour. Vegetables Sauerkraut, pickled vegetables, and relishes. Olives. [place] fries.  Onion rings. Regular canned vegetables (not low-sodium or reduced-sodium). Regular canned tomato sauce and paste (not low-sodium or reduced-sodium). Regular tomato and vegetable juice (not low-sodium or reduced-sodium). Frozen vegetables in sauces. Meats and other protein foods Meat or fish that is salted, canned, smoked, spiced, or pickled. Bacon, ham, sausage, hotdogs, corned beef, chipped beef, packaged lunch meats, salt pork, jerky, pickled herring, anchovies, regular canned tuna, sardines, salted nuts. Dairy Processed cheese and cheese spreads. Cheese curds. Blue cheese. Feta cheese. String cheese. Regular cottage cheese. Buttermilk. Canned milk. Fats and oils Salted butter. Regular margarine. Ghee. Bacon fat. Seasonings and other foods Onion salt, garlic salt, seasoned salt, table salt, and sea salt. Canned and packaged gravies. Worcestershire sauce. Tartar sauce. Barbecue sauce. Teriyaki sauce. Soy sauce, including reduced-sodium. Steak sauce. Fish sauce. Oyster sauce. Cocktail sauce. Horseradish that you find on the shelf. Regular ketchup and mustard. Meat flavorings and tenderizers. Bouillon cubes. Hot sauce and Tabasco sauce. Premade or packaged marinades. Premade or packaged taco seasonings. Relishes. Regular salad dressings. Salsa. Potato and tortilla chips. Corn chips and puffs. Salted popcorn and pretzels. Canned or dried soups. Pizza. Frozen entrees and pot pies. Summary  Eating less sodium can help lower your blood pressure, reduce swelling, and protect your heart, liver, and kidneys.  Most people on this plan should limit their sodium intake to 1,500-2,000 mg (milligrams) of sodium each day.  Canned, boxed, and frozen foods are high in sodium. Restaurant foods, fast foods, and pizza are also very high in sodium. You also get sodium by adding salt to food.  Try to cook at home, eat more fresh fruits and vegetables, and eat less fast food, canned, processed, or prepared foods. This  information is not intended to replace advice given to you by your health care provider. Make sure you discuss any questions you have with your health care provider. Document Released: 08/01/2001 Document Revised: 02/03/2016 Document Reviewed: 02/03/2016 Elsevier Interactive Patient Education  [address].      Signed, Jacolyn Reedy, [place]  11/01/2017 12:56 PM    [facility] [address] [place], [place], [place]  [address] Phone: [phone]; Fax: [phone]

## 2017-10-28 LAB — LIPID PANEL
Chol/HDL Ratio: 3.2 ratio (ref 0.0–5.0)
Cholesterol, Total: 146 mg/dL (ref 100–199)
HDL: 45 mg/dL (ref >39)
LDL Calculated: 88 mg/dL (ref 0–99)
Triglycerides: 63 mg/dL (ref 0–149)
VLDL Cholesterol Cal: 13 mg/dL (ref 5–40)

## 2017-10-28 LAB — BASIC METABOLIC PANEL
BUN/Creatinine Ratio: 16 (ref 10–24)
BUN: 26 mg/dL (ref 8–27)
CO2: 23 mmol/L (ref 20–29)
Calcium: 10 mg/dL (ref 8.6–10.2)
Chloride: 104 mmol/L (ref 96–106)
Creatinine, Ser: 1.65 mg/dL — ABNORMAL HIGH (ref 0.76–1.27)
GFR calc Af Amer: 48 mL/min/{1.73_m2} — ABNORMAL LOW (ref >59)
GFR calc non Af Amer: 41 mL/min/{1.73_m2} — ABNORMAL LOW (ref >59)
Glucose: 98 mg/dL (ref 65–99)
Potassium: 4.8 mmol/L (ref 3.5–5.2)
Sodium: 141 mmol/L (ref 134–144)

## 2017-10-28 LAB — PSA: Prostate Specific Ag, Serum: 0.9 ng/mL (ref 0.0–4.0)

## 2017-11-01 ENCOUNTER — Ambulatory Visit (INDEPENDENT_AMBULATORY_CARE_PROVIDER_SITE_OTHER): Payer: PPO | Admitting: Physician Assistant

## 2017-11-01 ENCOUNTER — Encounter: Payer: Self-pay | Admitting: Physician Assistant

## 2017-11-01 ENCOUNTER — Encounter: Payer: Self-pay | Admitting: *Deleted

## 2017-11-01 ENCOUNTER — Encounter

## 2017-11-01 VITALS — BP 124/64 | HR 84 | Ht 69.0 in | Wt 194.0 lb

## 2017-11-01 DIAGNOSIS — I471 Supraventricular tachycardia: Secondary | ICD-10-CM | POA: Diagnosis not present

## 2017-11-01 DIAGNOSIS — N182 Chronic kidney disease, stage 2 (mild): Secondary | ICD-10-CM

## 2017-11-01 DIAGNOSIS — R0789 Other chest pain: Secondary | ICD-10-CM

## 2017-11-01 DIAGNOSIS — Z720 Tobacco use: Secondary | ICD-10-CM | POA: Diagnosis not present

## 2017-11-01 DIAGNOSIS — R079 Chest pain, unspecified: Secondary | ICD-10-CM

## 2017-11-01 NOTE — Patient Instructions (Addendum)
Medication Instructions:  Your physician recommends that you continue on your current medications as directed. Please refer to the Current Medication list given to you today.   Labwork: NONE  Testing/Procedures: Your physician has requested that you have a lexiscan myoview. For further information please visit [email or web address] Please follow instruction sheet, as given.   Follow-Up: Your physician recommends that you schedule a follow-up appointment in: December with Dr. Purvis Sheffield.    Any Other Special Instructions Will Be Listed Below (If Applicable). Decrease Caffeine intake      If you need a refill on your cardiac medications before your next appointment, please call your pharmacy.  Thank you for choosing [facility]!   Low-Sodium Eating Plan Sodium, which is an element that makes up salt, helps you maintain a healthy balance of fluids in your body. Too much sodium can increase your blood pressure and cause fluid and waste to be held in your body. Your health care provider or dietitian may recommend following this plan if you have high blood pressure (hypertension), kidney disease, liver disease, or heart failure. Eating less sodium can help lower your blood pressure, reduce swelling, and protect your heart, liver, and kidneys. What are tips for following this plan? General guidelines  Most people on this plan should limit their sodium intake to 1,500-2,000 mg (milligrams) of sodium each day. Reading food labels  The Nutrition Facts label lists the amount of sodium in one serving of the food. If you eat more than one serving, you must multiply the listed amount of sodium by the number of servings.  Choose foods with less than 140 mg of sodium per serving.  Avoid foods with 300 mg of sodium or more per serving. Shopping  Look for lower-sodium products, often labeled as "low-sodium" or "no salt added."  Always check the sodium content even if foods are  labeled as "unsalted" or "no salt added".  Buy fresh foods. ? Avoid canned foods and premade or frozen meals. ? Avoid canned, cured, or processed meats  Buy breads that have less than 80 mg of sodium per slice. Cooking  Eat more home-cooked food and less restaurant, buffet, and fast food.  Avoid adding salt when cooking. Use salt-free seasonings or herbs instead of table salt or sea salt. Check with your health care provider or pharmacist before using salt substitutes.  Cook with plant-based oils, such as canola, sunflower, or olive oil. Meal planning  When eating at a restaurant, ask that your food be prepared with less salt or no salt, if possible.  Avoid foods that contain MSG (monosodium glutamate). MSG is sometimes added to [place] food, bouillon, and some canned foods. What foods are recommended? The items listed may not be a complete list. Talk with your dietitian about what dietary choices are best for you. Grains Low-sodium cereals, including oats, puffed wheat and rice, and shredded wheat. Low-sodium crackers. Unsalted rice. Unsalted pasta. Low-sodium bread. Whole-grain breads and whole-grain pasta. Vegetables Fresh or frozen vegetables. "No salt added" canned vegetables. "No salt added" tomato sauce and paste. Low-sodium or reduced-sodium tomato and vegetable juice. Fruits Fresh, frozen, or canned fruit. Fruit juice. Meats and other protein foods Fresh or frozen (no salt added) meat, poultry, seafood, and fish. Low-sodium canned tuna and salmon. Unsalted nuts. Dried peas, beans, and lentils without added salt. Unsalted canned beans. Eggs. Unsalted nut butters. Dairy Milk. Soy milk. Cheese that is naturally low in sodium, such as ricotta cheese, fresh mozzarella, or Swiss cheese Low-sodium or reduced-sodium  cheese. Cream cheese. Yogurt. Fats and oils Unsalted butter. Unsalted margarine with no trans fat. Vegetable oils such as canola or olive oils. Seasonings and other  foods Fresh and dried herbs and spices. Salt-free seasonings. Low-sodium mustard and ketchup. Sodium-free salad dressing. Sodium-free light mayonnaise. Fresh or refrigerated horseradish. Lemon juice. Vinegar. Homemade, reduced-sodium, or low-sodium soups. Unsalted popcorn and pretzels. Low-salt or salt-free chips. What foods are not recommended? The items listed may not be a complete list. Talk with your dietitian about what dietary choices are best for you. Grains Instant hot cereals. Bread stuffing, pancake, and biscuit mixes. Croutons. Seasoned rice or pasta mixes. Noodle soup cups. Boxed or frozen macaroni and cheese. Regular salted crackers. Self-rising flour. Vegetables Sauerkraut, pickled vegetables, and relishes. Olives. [place] fries. Onion rings. Regular canned vegetables (not low-sodium or reduced-sodium). Regular canned tomato sauce and paste (not low-sodium or reduced-sodium). Regular tomato and vegetable juice (not low-sodium or reduced-sodium). Frozen vegetables in sauces. Meats and other protein foods Meat or fish that is salted, canned, smoked, spiced, or pickled. Bacon, ham, sausage, hotdogs, corned beef, chipped beef, packaged lunch meats, salt pork, jerky, pickled herring, anchovies, regular canned tuna, sardines, salted nuts. Dairy Processed cheese and cheese spreads. Cheese curds. Blue cheese. Feta cheese. String cheese. Regular cottage cheese. Buttermilk. Canned milk. Fats and oils Salted butter. Regular margarine. Ghee. Bacon fat. Seasonings and other foods Onion salt, garlic salt, seasoned salt, table salt, and sea salt. Canned and packaged gravies. Worcestershire sauce. Tartar sauce. Barbecue sauce. Teriyaki sauce. Soy sauce, including reduced-sodium. Steak sauce. Fish sauce. Oyster sauce. Cocktail sauce. Horseradish that you find on the shelf. Regular ketchup and mustard. Meat flavorings and tenderizers. Bouillon cubes. Hot sauce and Tabasco sauce. Premade or packaged  marinades. Premade or packaged taco seasonings. Relishes. Regular salad dressings. Salsa. Potato and tortilla chips. Corn chips and puffs. Salted popcorn and pretzels. Canned or dried soups. Pizza. Frozen entrees and pot pies. Summary  Eating less sodium can help lower your blood pressure, reduce swelling, and protect your heart, liver, and kidneys.  Most people on this plan should limit their sodium intake to 1,500-2,000 mg (milligrams) of sodium each day.  Canned, boxed, and frozen foods are high in sodium. Restaurant foods, fast foods, and pizza are also very high in sodium. You also get sodium by adding salt to food.  Try to cook at home, eat more fresh fruits and vegetables, and eat less fast food, canned, processed, or prepared foods. This information is not intended to replace advice given to you by your health care provider. Make sure you discuss any questions you have with your health care provider. Document Released: 08/01/2001 Document Revised: 02/03/2016 Document Reviewed: 02/03/2016 Elsevier Interactive Patient Education  [employer].

## 2017-11-03 ENCOUNTER — Ambulatory Visit (INDEPENDENT_AMBULATORY_CARE_PROVIDER_SITE_OTHER): Payer: PPO | Admitting: Family Medicine

## 2017-11-03 ENCOUNTER — Encounter: Payer: Self-pay | Admitting: Family Medicine

## 2017-11-03 VITALS — BP 110/68 | HR 100 | Ht 66.75 in | Wt 194.0 lb

## 2017-11-03 DIAGNOSIS — Z1211 Encounter for screening for malignant neoplasm of colon: Secondary | ICD-10-CM | POA: Diagnosis not present

## 2017-11-03 DIAGNOSIS — Z0000 Encounter for general adult medical examination without abnormal findings: Secondary | ICD-10-CM | POA: Diagnosis not present

## 2017-11-03 DIAGNOSIS — Z23 Encounter for immunization: Secondary | ICD-10-CM | POA: Diagnosis not present

## 2017-11-03 MED ORDER — ZOSTER VAC RECOMB ADJUVANTED 50 MCG/0.5ML IM SUSR: 0.5000 mL | Freq: Once | INTRAMUSCULAR | 1 refills | Status: AC

## 2017-11-03 NOTE — Patient Instructions (Signed)

## 2017-11-03 NOTE — Progress Notes (Signed)
   Subjective:    Patient ID: Blake Cherry, male    DOB: 1945-09-15, 72 y.o.   MRN: [id number]  HPI AWV- Annual Wellness Visit  The patient was seen for their annual wellness visit. The patient's past medical history, surgical history, and family history were reviewed. Pertinent vaccines were reviewed ( tetanus, pneumonia, shingles, flu) The patient's medication list was reviewed and updated.  The height and weight were entered.  BMI recorded in electronic record elsewhere  Cognitive screening was completed. Outcome of Mini - Cog: pass   Falls /depression screening electronically recorded within record elsewhere  Current tobacco usage:quit smoking but uses snus pouches.   (All patients who use tobacco were given written and verbal information on quitting)  Recent listing of emergency department/hospitalizations over the past year were reviewed.  current specialist the patient sees on a regular basis: Dr. Barth Kirks - Pulmonologist.    Medicare annual wellness visit patient questionnaire was reviewed.  A written screening schedule for the patient for the next 5-10 years was given. Appropriate discussion of followup regarding next visit was discussed.  Flu vaccine today.        Review of Systems  Constitutional: Negative for activity change, appetite change and fever.  HENT: Negative for congestion and rhinorrhea.   Eyes: Negative for discharge.  Respiratory: Negative for cough and wheezing.   Cardiovascular: Negative for chest pain.  Gastrointestinal: Negative for abdominal pain, blood in stool and vomiting.  Genitourinary: Negative for difficulty urinating and frequency.  Musculoskeletal: Negative for neck pain.  Skin: Negative for rash.  Allergic/Immunologic: Negative for environmental allergies and food allergies.  Neurological: Negative for weakness and headaches.  Psychiatric/Behavioral: Negative for agitation.       Objective:   Physical Exam    Constitutional: He appears well-developed and well-nourished.  HENT:  Head: Normocephalic and atraumatic.  Right Ear: External ear normal.  Left Ear: External ear normal.  Nose: Nose normal.  Mouth/Throat: Oropharynx is clear and moist.  Eyes: Pupils are equal, round, and reactive to light. EOM are normal.  Neck: Normal range of motion. Neck supple. No thyromegaly present.  Cardiovascular: Normal rate, regular rhythm and normal heart sounds.  No murmur heard. Pulmonary/Chest: Effort normal and breath sounds normal. No respiratory distress. He has no wheezes.  Abdominal: Soft. Bowel sounds are normal. He exhibits no distension and no mass. There is no tenderness.  Genitourinary: Penis normal.  Musculoskeletal: Normal range of motion. He exhibits no edema.  Lymphadenopathy:    He has no cervical adenopathy.  Neurological: He is alert. He exhibits normal muscle tone.  Skin: Skin is warm and dry. No erythema.  Psychiatric: He has a normal mood and affect. His behavior is normal. Judgment normal.    Prostate exam deferred      Assessment & Plan:  Adult wellness-complete.wellness physical was conducted today. Importance of diet and exercise were discussed in detail.  In addition to this a discussion regarding safety was also covered. We also reviewed over immunizations and Cherry recommendations regarding current immunization needed for age.  In addition to this additional areas were also touched on including: Preventative health exams needed:  Colonoscopy patient prefers we will do stool test for blood  Patient was advised yearly wellness exam

## 2017-11-04 ENCOUNTER — Encounter (HOSPITAL_COMMUNITY): Payer: Self-pay

## 2017-11-04 ENCOUNTER — Encounter (HOSPITAL_COMMUNITY)
Admission: RE | Admit: 2017-11-04 | Discharge: 2017-11-04 | Disposition: A | Discharge status: Home / Self Care | Payer: PPO | Source: Ambulatory Visit | Attending: Physician Assistant | Admitting: Physician Assistant

## 2017-11-04 ENCOUNTER — Ambulatory Visit (HOSPITAL_COMMUNITY)
Admission: RE | Admit: 2017-11-04 | Discharge: 2017-11-04 | Disposition: A | Discharge status: Home / Self Care | Payer: PPO | Source: Ambulatory Visit | Attending: Physician Assistant | Admitting: Physician Assistant

## 2017-11-04 DIAGNOSIS — I471 Supraventricular tachycardia: Secondary | ICD-10-CM | POA: Diagnosis not present

## 2017-11-04 DIAGNOSIS — R0789 Other chest pain: Secondary | ICD-10-CM | POA: Diagnosis not present

## 2017-11-04 LAB — NM MYOCAR MULTI W/SPECT W/WALL MOTION / EF
LV dias vol: 73 mL (ref 62–150)
LV sys vol: 19 mL
Peak HR: 103 {beats}/min
RATE: 0.47
Rest HR: 78 {beats}/min
SDS: 5
SRS: 2
SSS: 7
TID: 0.86

## 2017-11-04 MED ORDER — SODIUM CHLORIDE 0.9% FLUSH
INTRAVENOUS | Status: AC
Start: 2017-11-04 — End: 2017-11-04
  Administered 2017-11-04: 10 mL via INTRAVENOUS
  Filled 2017-11-04: qty 10

## 2017-11-04 MED ORDER — REGADENOSON 0.4 MG/5ML IV SOLN
INTRAVENOUS | Status: AC
  Administered 2017-11-04: 0.4 mg via INTRAVENOUS
  Filled 2017-11-04: qty 5

## 2017-11-04 MED ORDER — TECHNETIUM TC 99M TETROFOSMIN IV KIT
30.0000 | PACK | Freq: Once | INTRAVENOUS | Status: AC | PRN
  Administered 2017-11-04: 30.1 via INTRAVENOUS

## 2017-11-04 MED ORDER — TECHNETIUM TC 99M TETROFOSMIN IV KIT
10.0000 | PACK | Freq: Once | INTRAVENOUS | Status: AC | PRN
  Administered 2017-11-04: 10.5 via INTRAVENOUS

## 2018-01-10 ENCOUNTER — Other Ambulatory Visit: Payer: Self-pay | Admitting: Physician Assistant

## 2018-01-10 MED ORDER — DILTIAZEM HCL ER COATED BEADS 240 MG PO CP24
240.0000 mg | ORAL_CAPSULE | Freq: Every day | ORAL | 2 refills | Status: DC
Start: 2018-01-10 — End: 2018-10-11

## 2018-01-10 NOTE — Telephone Encounter (Signed)
Needing refill for diltiazem (CARDIZEM CD) 240 MG 24 hr capsule [[id number]][[id number]]  Sent to [facility] on [address].

## 2018-01-10 NOTE — Telephone Encounter (Signed)
Pharmacy changed to [facility], e-scribed refill

## 2018-01-12 ENCOUNTER — Telehealth: Payer: Self-pay | Admitting: Family Medicine

## 2018-01-12 NOTE — Telephone Encounter (Signed)
Patient states is having a flare-up with COPD if you could call something in ,he states was suppose to see Dr. Juanetta GoslingHawkins but he got call into surgery wasn't able to see him.[facility]

## 2018-01-12 NOTE — Telephone Encounter (Signed)
Please advise. Thank you

## 2018-01-13 ENCOUNTER — Encounter: Payer: Self-pay | Admitting: Family Medicine

## 2018-01-13 ENCOUNTER — Ambulatory Visit: Payer: PPO | Admitting: Family Medicine

## 2018-01-13 ENCOUNTER — Ambulatory Visit (INDEPENDENT_AMBULATORY_CARE_PROVIDER_SITE_OTHER): Payer: PPO | Admitting: Family Medicine

## 2018-01-13 VITALS — BP 116/72 | Temp 98.2°F | Ht 69.0 in | Wt 195.4 lb

## 2018-01-13 DIAGNOSIS — J441 Chronic obstructive pulmonary disease with (acute) exacerbation: Secondary | ICD-10-CM

## 2018-01-13 MED ORDER — TRIAMCINOLONE ACETONIDE 0.1 % EX CREA: 1.0000 "application " | TOPICAL_CREAM | Freq: Two times a day (BID) | CUTANEOUS | 4 refills | Status: DC

## 2018-01-13 MED ORDER — PREDNISONE 20 MG PO TABS: ORAL_TABLET | ORAL | 0 refills | Status: DC

## 2018-01-13 MED ORDER — CEFDINIR 300 MG PO CAPS: 300.0000 mg | ORAL_CAPSULE | Freq: Two times a day (BID) | ORAL | 0 refills | Status: DC

## 2018-01-13 NOTE — Telephone Encounter (Signed)
Contacted patient and patient states that it is more of a cough/congestion. Pt states he is having a really productive cough. He states he is coughing up greenish/gray mucus. Pt states no fever; some shortness of breath but he states that is usual for him. Spoke with provider due to provider only having  A 4:50 appointment. Provider stated that patient could come in at 2:45 for a 3:00 appt.

## 2018-01-13 NOTE — Progress Notes (Signed)
   Subjective:    Patient ID: Blake Cherry, male    DOB: 06/07/1945, 72 y.o.   MRN: [id number]  HPI Patient arrives with flare of COPD. Patient had an appointment with pulmonologist but it was cancelled due to emergency and they can not get him back in until January. Significant cough congestion drainage denies high fever chills sweats does have COPD oxygen level at home doing good wears oxygen intermittently PMH COPD  Review of Systems  Constitutional: Negative for activity change, chills and fever.  HENT: Positive for congestion and rhinorrhea. Negative for ear pain.   Eyes: Negative for discharge.  Respiratory: Positive for cough. Negative for wheezing.   Cardiovascular: Negative for chest pain.  Gastrointestinal: Negative for nausea and vomiting.  Musculoskeletal: Negative for arthralgias.       Objective:   Physical Exam  Constitutional: He appears well-developed.  HENT:  Head: Normocephalic.  Mouth/Throat: Oropharynx is clear and moist. No oropharyngeal exudate.  Neck: Normal range of motion.  Cardiovascular: Normal rate, regular rhythm and normal heart sounds.  No murmur heard. Pulmonary/Chest: Effort normal and breath sounds normal. He has no wheezes.  Lymphadenopathy:    He has no cervical adenopathy.  Neurological: He exhibits normal muscle tone.  Skin: Skin is warm and dry.  Nursing note and vitals reviewed.         Assessment & Plan:  COPD flareup Prednisone taper X-rays lab work not indicated currently If progressive troubles or worse follow-up Patient instructed to immediately call us if feeling worse

## 2018-01-13 NOTE — Telephone Encounter (Signed)
Nurses-please talk with the patient find out is it just more so that he is having some coughing congestion and some flare of his COPD? Or is he having significant shortness of breath or fevers? We can certainly see the patient if having significant shortness of breath or fever I could work him in this afternoon

## 2018-02-07 ENCOUNTER — Encounter: Payer: Self-pay | Admitting: Cardiovascular Disease

## 2018-02-07 ENCOUNTER — Ambulatory Visit: Payer: PPO | Admitting: Cardiovascular Disease

## 2018-02-07 VITALS — BP 115/60 | HR 111 | Ht 67.0 in | Wt 189.6 lb

## 2018-02-07 DIAGNOSIS — R079 Chest pain, unspecified: Secondary | ICD-10-CM

## 2018-02-07 DIAGNOSIS — I471 Supraventricular tachycardia: Secondary | ICD-10-CM

## 2018-02-07 DIAGNOSIS — Z95828 Presence of other vascular implants and grafts: Secondary | ICD-10-CM

## 2018-02-07 DIAGNOSIS — J449 Chronic obstructive pulmonary disease, unspecified: Secondary | ICD-10-CM | POA: Diagnosis not present

## 2018-02-07 DIAGNOSIS — I10 Essential (primary) hypertension: Secondary | ICD-10-CM

## 2018-02-07 NOTE — Progress Notes (Signed)
SUBJECTIVE: The patient presents for routine follow-up.  I initially evaluated him for SVT on 10/04/2017 while hospitalized.  Past medical history also includes abdominal aortic aneurysm repair in 2014, hypertension, COPD, obesity, and tobacco use.  He underwent a normal nuclear stress test on 11/04/2017.  This was performed as he had had chest pain with a troponin elevation while hospitalized.  He currently denies chest pain, orthopnea, paroxysmal nocturnal dyspnea, and leg swelling.  He said his COPD has been acting up more recently.  O2 saturations are normal, 91%.  After taking his inhalers his heart rate ranges from 100-115 bpm.  Social history: He studied Estate agentvocal performance at [employer].  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  He has attended the same church for over 4 decades, [employer] [place]    Review of Systems: As per "subjective", otherwise negative.  No Known Allergies  Current Outpatient Medications  Medication Sig Dispense Refill  . albuterol (PROVENTIL HFA;VENTOLIN HFA) 108 (90 Base) MCG/ACT inhaler INHALE 2 PUFFS BY MOUTH EVERY 6 HOURS AS NEEDED FOR WHEEZING OR SHORTNESS OF BREATH 9 each 11  . albuterol (PROVENTIL) (2.5 MG/3ML) 0.083% nebulizer solution Take 3 mLs (2.5 mg total) by nebulization every 4 (four) hours as needed for wheezing or shortness of breath. 450 mL 12  . aspirin EC 81 MG tablet Take 81 mg by mouth daily.    . budesonide (PULMICORT) 0.5 MG/2ML nebulizer solution USE 1 VIAL IN NEBULIZER TWICE DAILY 120 mL 12  . budesonide-formoterol (SYMBICORT) 160-4.5 MCG/ACT inhaler Inhale 2 puffs into the lungs 2 (two) times daily. 3 Inhaler 4  . diltiazem (CARDIZEM CD) 240 MG 24 hr capsule Take 1 capsule (240 mg total) by mouth daily. 90 capsule 2  . guaiFENesin (MUCINEX) 600 MG 12 hr tablet Take 1 tablet (600 mg total) by mouth 2 (two) times daily. 20 tablet 0  . lisinopril  (PRINIVIL,ZESTRIL) 2.5 MG tablet Take 1 tablet (2.5 mg total) by mouth daily. 90 tablet 3  . Multiple Vitamins-Minerals (OCUVITE PRESERVISION PO) Take 1 capsule by mouth daily.     Marland Kitchen. rOPINIRole (REQUIP) 0.5 MG tablet Take 1 tablet (0.5 mg total) by mouth 2 (two) times daily. 60 tablet 6  . triamcinolone cream (KENALOG) 0.1 % Apply 1 application topically 2 (two) times daily. 45 g 4   No current facility-administered medications for this visit.     Past Medical History:  Diagnosis Date  . AAA (abdominal aortic aneurysm) (HCC)   . Carotid artery occlusion 2007   Left cea  . COPD (chronic obstructive pulmonary disease) (HCC)   . Diverticulitis    last time 4 years ago  . Hypertension    per Dr Tenny Crawoss  . Shortness of breath    with exertion  . Stroke [facility](HCC) 05/2005    2 in 2007.  Weakness and numbness in left arm and leg    Past Surgical History:  Procedure Laterality Date  . ABDOMINAL AORTAGRAM N/A 03/10/2013   Procedure: ABDOMINAL Ronny FlurryAORTAGRAM;  Surgeon: Sherren Kernsharles E Fields, MD;  [facility];  Service: Cardiovascular;  Laterality: N/A;  . ABDOMINAL AORTIC ENDOVASCULAR STENT GRAFT N/A 08/22/2012   Procedure: ABDOMINAL AORTIC ENDOVASCULAR STENT GRAFT;  Surgeon: Sherren Kernsharles E Fields, MD;  [facility];  Service: Vascular;  Laterality: N/A;  . CAROTID ENDARTERECTOMY Left 2007  . CAROTID STENT  2007   left internal carotid artery  . TESTICLE  TORSION REDUCTION     age of 27    Social History   Socioeconomic History  . Marital status: Married    Spouse name: Not on file  . Number of children: Not on file  . Years of education: Not on file  . Highest education level: Not on file  Occupational History  . Not on file  Social Needs  . Financial resource strain: Not on file  . Food insecurity:    Worry: Not on file    Inability: Not on file  . Transportation needs:    Medical: Not on file    Non-medical: Not on file  Tobacco Use  . Smoking status: Former Smoker    Packs/day:  1.00    Years: 50.00    Pack years: 50.00    Types: Cigarettes    Last attempt to quit: 10/01/2017    Years since quitting: 0.3  . Smokeless tobacco: Former Neurosurgeon    Types: Snuff  Substance and Sexual Activity  . Alcohol use: No    Alcohol/week: 0.0 standard drinks  . Drug use: No  . Sexual activity: Not on file  Lifestyle  . Physical activity:    Days per week: Not on file    Minutes per session: Not on file  . Stress: Not on file  Relationships  . Social connections:    Talks on phone: Not on file    Gets together: Not on file    Attends religious service: Not on file    Active member of club or organization: Not on file    Attends meetings of clubs or organizations: Not on file    Relationship status: Not on file  . Intimate partner violence:    Fear of current or ex partner: Not on file    Emotionally abused: Not on file    Physically abused: Not on file    Forced sexual activity: Not on file  Other Topics Concern  . Not on file  Social History Narrative  . Not on file     Vitals:   02/07/18 1547  BP: 115/60  Pulse: (!) 111  SpO2: 91%  Weight: 189 lb 9.6 oz (86 kg)  Height: 5\' 7"  (1.702 m)    Wt Readings from Last 3 Encounters:  02/07/18 189 lb 9.6 oz (86 kg)  01/13/18 195 lb 6.4 oz (88.6 kg)  11/03/17 194 lb (88 kg)     PHYSICAL EXAM General: NAD HEENT: Normal. Neck: No JVD, no thyromegaly. Lungs: Poor air movement throughout, no wheezes or crackles. CV: Regular rate and rhythm, normal S1/S2, no S3/S4, no murmur. No pretibial or periankle edema.   Abdomen: Soft, nontender, no distention.  Neurologic: Alert and oriented.  Psych: Normal affect. Skin: Normal. Musculoskeletal: No gross deformities.    ECG: Reviewed above under Subjective   Labs: Lab Results  Component Value Date/Time   K 4.8 10/27/2017 08:41 AM   BUN 26 10/27/2017 08:41 AM   CREATININE 1.65 (H) 10/27/2017 08:41 AM   CREATININE 1.33 10/27/2013 07:31 AM   ALT 11 10/03/2017  03:15 PM   TSH 1.180 10/03/2017 03:15 PM   HGB 15.6 10/03/2017 03:23 PM   HGB 15.3 05/24/2017 02:32 PM     Lipids: Lab Results  Component Value Date/Time   LDLCALC 88 10/27/2017 08:41 AM   CHOL 146 10/27/2017 08:41 AM   TRIG 63 10/27/2017 08:41 AM   HDL 45 10/27/2017 08:41 AM       ASSESSMENT AND PLAN: 1.  SVT: No recurrence.  He remains on Cardizem CD 240 mg daily.  Symptomatically stable.  2.  Chest pain/coronary atherosclerosis/troponin elevation: He underwent a normal nuclear stress test on 11/04/2017.  Symptomatically stable.  No further cardiac testing is indicated at this time.  3.  Abdominal aortic aneurysm: Status post repair in 2014.  4.  COPD: More frequent inhaler use recently.  Managed by PCP.  5.  Hypertension: Blood pressure is normal.  No changes to therapy.    Disposition: Follow up 1 year   Prentice Docker, M.D., F.A.C.C.

## 2018-02-07 NOTE — Patient Instructions (Signed)
Medication Instructions: Your physician recommends that you continue on your current medications as directed. Please refer to the Current Medication list given to you today.  If you need a refill on your cardiac medications before your next appointment, please call your pharmacy.   Lab work: None If you have labs (blood work) drawn today and your tests are completely normal, you will receive your results only by: . MyChart Message (if you have MyChart) OR . A paper copy in the mail If you have any lab test that is abnormal or we need to change your treatment, we will call you to review the results.  Testing/Procedures: None  Follow-Up: At [facility], you and your health needs are our priority.  As part of our continuing mission to provide you with exceptional heart care, we have created designated Provider Care Teams.  These Care Teams include your primary Cardiologist (physician) and Advanced Practice Providers (APPs -  Physician Assistants and Nurse Practitioners) who all work together to provide you with the care you need, when you need it. You will need a follow up appointment in 1 years.  Please call our office 2 months in advance to schedule this appointment.  You may see Suresh Koneswaran, MD or one of the following Advanced Practice Providers on your designated Care Team:   Brittany Strader, PA-C ([place]) . Michele Lenze, PA-C ([place] [facility])  Any Other Special Instructions Will Be Listed Below (If Applicable). None   

## 2018-02-24 DIAGNOSIS — J441 Chronic obstructive pulmonary disease with (acute) exacerbation: Secondary | ICD-10-CM | POA: Diagnosis not present

## 2018-02-24 DIAGNOSIS — I10 Essential (primary) hypertension: Secondary | ICD-10-CM | POA: Diagnosis not present

## 2018-02-24 DIAGNOSIS — J301 Allergic rhinitis due to pollen: Secondary | ICD-10-CM | POA: Diagnosis not present

## 2018-02-24 DIAGNOSIS — F17200 Nicotine dependence, unspecified, uncomplicated: Secondary | ICD-10-CM | POA: Diagnosis not present

## 2018-04-29 ENCOUNTER — Other Ambulatory Visit: Payer: Self-pay

## 2018-04-29 ENCOUNTER — Encounter (HOSPITAL_COMMUNITY): Payer: Self-pay | Admitting: *Deleted

## 2018-04-29 ENCOUNTER — Emergency Department (HOSPITAL_COMMUNITY): Payer: PPO

## 2018-04-29 ENCOUNTER — Emergency Department (HOSPITAL_COMMUNITY)
Admission: EM | Admit: 2018-04-29 | Discharge: 2018-04-29 | Disposition: A | Discharge status: Home / Self Care | Payer: PPO | Attending: Emergency Medicine | Admitting: Emergency Medicine

## 2018-04-29 DIAGNOSIS — R0602 Shortness of breath: Secondary | ICD-10-CM | POA: Diagnosis not present

## 2018-04-29 DIAGNOSIS — J449 Chronic obstructive pulmonary disease, unspecified: Secondary | ICD-10-CM | POA: Diagnosis not present

## 2018-04-29 DIAGNOSIS — N182 Chronic kidney disease, stage 2 (mild): Secondary | ICD-10-CM | POA: Diagnosis not present

## 2018-04-29 DIAGNOSIS — Z9119 Patient's noncompliance with other medical treatment and regimen: Secondary | ICD-10-CM | POA: Diagnosis not present

## 2018-04-29 DIAGNOSIS — Z9114 Patient's other noncompliance with medication regimen: Secondary | ICD-10-CM | POA: Diagnosis not present

## 2018-04-29 DIAGNOSIS — I471 Supraventricular tachycardia: Secondary | ICD-10-CM | POA: Diagnosis not present

## 2018-04-29 DIAGNOSIS — I129 Hypertensive chronic kidney disease with stage 1 through stage 4 chronic kidney disease, or unspecified chronic kidney disease: Secondary | ICD-10-CM | POA: Insufficient documentation

## 2018-04-29 DIAGNOSIS — Z79899 Other long term (current) drug therapy: Secondary | ICD-10-CM | POA: Insufficient documentation

## 2018-04-29 DIAGNOSIS — Z7982 Long term (current) use of aspirin: Secondary | ICD-10-CM | POA: Diagnosis not present

## 2018-04-29 DIAGNOSIS — Z8673 Personal history of transient ischemic attack (TIA), and cerebral infarction without residual deficits: Secondary | ICD-10-CM | POA: Diagnosis not present

## 2018-04-29 DIAGNOSIS — R002 Palpitations: Secondary | ICD-10-CM | POA: Diagnosis not present

## 2018-04-29 DIAGNOSIS — Z87891 Personal history of nicotine dependence: Secondary | ICD-10-CM | POA: Diagnosis not present

## 2018-04-29 HISTORY — DX: Supraventricular tachycardia, unspecified: I47.10

## 2018-04-29 HISTORY — DX: Supraventricular tachycardia: I47.1

## 2018-04-29 LAB — CBC WITH DIFFERENTIAL/PLATELET
Abs Immature Granulocytes: 0.1 10*3/uL — ABNORMAL HIGH (ref 0.00–0.07)
Basophils Absolute: 0 10*3/uL (ref 0.0–0.1)
Basophils Relative: 0 %
Eosinophils Absolute: 0.1 10*3/uL (ref 0.0–0.5)
Eosinophils Relative: 1 %
HCT: 50.4 % (ref 39.0–52.0)
Hemoglobin: 15.5 g/dL (ref 13.0–17.0)
Immature Granulocytes: 1 %
Lymphocytes Relative: 5 %
Lymphs Abs: 0.7 10*3/uL (ref 0.7–4.0)
MCH: 31.1 pg (ref 26.0–34.0)
MCHC: 30.8 g/dL (ref 30.0–36.0)
MCV: 101.2 fL — ABNORMAL HIGH (ref 80.0–100.0)
Monocytes Absolute: 0.5 10*3/uL (ref 0.1–1.0)
Monocytes Relative: 3 %
Neutro Abs: 12.3 10*3/uL — ABNORMAL HIGH (ref 1.7–7.7)
Neutrophils Relative %: 90 %
Platelets: 294 10*3/uL (ref 150–400)
RBC: 4.98 MIL/uL (ref 4.22–5.81)
RDW: 13.4 % (ref 11.5–15.5)
WBC: 13.7 10*3/uL — ABNORMAL HIGH (ref 4.0–10.5)
nRBC: 0 % (ref 0.0–0.2)

## 2018-04-29 LAB — BASIC METABOLIC PANEL
Anion gap: 11 (ref 5–15)
BUN: 35 mg/dL — ABNORMAL HIGH (ref 8–23)
CO2: 21 mmol/L — ABNORMAL LOW (ref 22–32)
Calcium: 8.4 mg/dL — ABNORMAL LOW (ref 8.9–10.3)
Chloride: 108 mmol/L (ref 98–111)
Creatinine, Ser: 1.89 mg/dL — ABNORMAL HIGH (ref 0.61–1.24)
GFR calc Af Amer: 40 mL/min — ABNORMAL LOW (ref >60)
GFR calc non Af Amer: 35 mL/min — ABNORMAL LOW (ref >60)
Glucose, Bld: 176 mg/dL — ABNORMAL HIGH (ref 70–99)
Potassium: 4.5 mmol/L (ref 3.5–5.1)
Sodium: 140 mmol/L (ref 135–145)

## 2018-04-29 LAB — TROPONIN I: Troponin I: <0.03 ng/mL (ref <0.03)

## 2018-04-29 LAB — MAGNESIUM: Magnesium: 2 mg/dL (ref 1.7–2.4)

## 2018-04-29 LAB — BRAIN NATRIURETIC PEPTIDE: B Natriuretic Peptide: 779 pg/mL — ABNORMAL HIGH (ref 0.0–100.0)

## 2018-04-29 MED ORDER — DILTIAZEM HCL 25 MG/5ML IV SOLN
10.0000 mg | Freq: Once | INTRAVENOUS | Status: AC
  Administered 2018-04-29: 10 mg via INTRAVENOUS

## 2018-04-29 MED ORDER — DILTIAZEM HCL 100 MG IV SOLR
INTRAVENOUS | Status: AC
  Filled 2018-04-29: qty 100

## 2018-04-29 MED ORDER — DILTIAZEM HCL 100 MG IV SOLR
5.0000 mg/h | INTRAVENOUS | Status: DC
  Administered 2018-04-29: 5 mg/h via INTRAVENOUS

## 2018-04-29 MED ORDER — DILTIAZEM HCL ER COATED BEADS 240 MG PO CP24
240.0000 mg | ORAL_CAPSULE | Freq: Once | ORAL | Status: AC
  Administered 2018-04-29: 240 mg via ORAL
  Filled 2018-04-29: qty 1

## 2018-04-29 MED ORDER — DILTIAZEM LOAD VIA INFUSION
15.0000 mg | Freq: Once | INTRAVENOUS | Status: DC
  Filled 2018-04-29: qty 15

## 2018-04-29 NOTE — Discharge Instructions (Addendum)
Take your usual prescriptions as previously directed.  Call your Cardiologist today to schedule a follow up appointment within the next 3 days.  Return to the Emergency Department immediately sooner if worsening.

## 2018-04-29 NOTE — ED Notes (Signed)
Pharmacy paged to bring oral cardizem

## 2018-04-29 NOTE — ED Notes (Signed)
Pt placed on zoll pads 

## 2018-04-29 NOTE — ED Triage Notes (Addendum)
Pt c/o generalized weakness and feeling of "about to pass out" x 2 days. Pt reports "things start graying out". Pt reports these episodes occur mainly whenever he stands up. Pt also reports SOB is more than usual.

## 2018-04-29 NOTE — ED Provider Notes (Signed)
[facility] EMERGENCY DEPARTMENT Provider Note   CSN: [id number] Arrival date & time: 04/29/18  1320    History   Chief Complaint Chief Complaint  Patient presents with  . Weakness    HPI Blake Cherry is a 73 y.o. male.     HPI Pt was seen at 1340. Per pt, c/o sudden onset and persistence of constant "palpitations" for the past 3 days. Has been associated with lightheadedness and "more SOB than usual." Pt states he ran out of his cardizem and has missed several doses. States he also has a "fast HR" when he is on prednisone, of which he just finished a course yesterday. States his HR at baseline is "100-115's" and "goes higher when I take my inhalers." Denies CP, no cough, no abd pain, no N/V/D, no back pain, no fevers, no rash, no focal motor weakness, no tingling/numbness in extremities.    Past Medical History:  Diagnosis Date  . AAA (abdominal aortic aneurysm) (HCC)   . Carotid artery occlusion 2007   Left cea  . COPD (chronic obstructive pulmonary disease) (HCC)   . Diverticulitis    last time 4 years ago  . Hypertension    per Dr Tenny Craw  . Shortness of breath    with exertion  . Stroke [facility]) 05/2005    2 in 2007.  Weakness and numbness in left arm and leg  . SVT (supraventricular tachycardia) [facility])     Patient Active Problem List   Diagnosis Date Noted  . Chest pain of uncertain etiology 10/27/2017  . SVT (supraventricular tachycardia) (HCC) 10/03/2017  . Leukocytosis 11/24/2016  . COPD with acute exacerbation (HCC) 09/16/2014  . AKI (acute kidney injury) (HCC) 09/16/2014  . 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 Hyperglycemia, drug-induced 09/16/2014  . Acute respiratory failure with hypoxia (HCC) 09/16/2014  . COPD (chronic obstructive pulmonary disease) (HCC) 07/18/2014  . Obesity 09/26/2012  . Aftercare following surgery of the circulatory system, NEC 09/22/2012  . Essential hypertension, benign 09/02/2012  . Hyperlipidemia 09/02/2012  .  Tobacco abuse 09/02/2012  . Abdominal aneurysm without mention of rupture 10/20/2011    Past Surgical History:  Procedure Laterality Date  . ABDOMINAL AORTAGRAM N/A 03/10/2013   Procedure: ABDOMINAL Ronny Flurry;  Surgeon: Sherren Kerns, MD;  Location: [facility] CATH LAB;  Service: Cardiovascular;  Laterality: N/A;  . ABDOMINAL AORTIC ENDOVASCULAR STENT GRAFT N/A 08/22/2012   Procedure: ABDOMINAL AORTIC ENDOVASCULAR STENT GRAFT;  Surgeon: Sherren Kerns, MD;  Location: [facility] OR;  Service: Vascular;  Laterality: N/A;  . CAROTID ENDARTERECTOMY Left 2007  . CAROTID STENT  2007   left internal carotid artery  . TESTICLE TORSION REDUCTION     age of 52        Home Medications    Prior to Admission medications   Medication Sig Start Date End Date Taking? Authorizing Provider  albuterol (PROVENTIL HFA;VENTOLIN HFA) 108 (90 Base) MCG/ACT inhaler INHALE 2 PUFFS BY MOUTH EVERY 6 HOURS AS NEEDED FOR WHEEZING OR SHORTNESS OF BREATH 05/14/17   [name], MD  albuterol (PROVENTIL) (2.5 MG/3ML) 0.083% nebulizer solution Take 3 mLs (2.5 mg total) by nebulization every 4 (four) hours as needed for wheezing or shortness of breath. 05/03/17   [name], MD  aspirin EC 81 MG tablet Take 81 mg by mouth daily.    [provider]  budesonide (PULMICORT) 0.5 MG/2ML nebulizer solution USE 1 VIAL IN NEBULIZER TWICE DAILY 05/03/17   [name], MD  budesonide-formoterol (SYMBICORT) 160-4.5 MCG/ACT inhaler Inhale 2 puffs into the lungs 2 (two) times daily. 05/03/17   [name], MD  diltiazem (CARDIZEM CD) 240 MG 24 hr capsule Take 1 capsule (240 mg total) by mouth daily. 01/10/18   Dyann Kief, PA-C  guaiFENesin (MUCINEX) 600 MG 12 hr tablet Take 1 tablet (600 mg total) by mouth 2 (two) times daily. 09/19/14   Erick Blinks, MD  lisinopril (PRINIVIL,ZESTRIL) 2.5 MG tablet Take 1 tablet (2.5 mg total) by mouth daily. 05/03/17   [name], MD  Multiple Vitamins-Minerals (OCUVITE  PRESERVISION PO) Take 1 capsule by mouth daily.     [provider]  rOPINIRole (REQUIP) 0.5 MG tablet Take 1 tablet (0.5 mg total) by mouth 2 (two) times daily. 10/11/17   [name], MD  triamcinolone cream (KENALOG) 0.1 % Apply 1 application topically 2 (two) times daily. 01/13/18   [name], MD    Family History Family History  Problem Relation Age of Onset  . Cancer Mother        Breast cancer  . Hypertension Mother   . Diabetes Mother   . Diabetes Father     Social History Social History   Tobacco Use  . Smoking status: Former Smoker    Packs/day: 1.00    Years: 50.00    Pack years: 50.00    Types: Cigarettes    Last attempt to quit: 10/01/2017    Years since quitting: 0.5  . Smokeless tobacco: Former Neurosurgeon    Types: Snuff  Substance Use Topics  . Alcohol use: No    Alcohol/week: 0.0 standard drinks  . Drug use: No     Allergies   Patient has no known allergies.   Review of Systems Review of Systems ROS: Statement: All systems negative except as marked or noted in the HPI; Constitutional: Negative for fever and chills. ; ; Eyes: Negative for eye pain, redness and discharge. ; ; ENMT: Negative for ear pain, hoarseness, nasal congestion, sinus pressure and sore throat. ; ; Cardiovascular: +palpitations, SOB. Negative for chest pain, diaphoresis, and peripheral edema. ; ; Respiratory: Negative for cough, wheezing and stridor. ; ; Gastrointestinal: Negative for nausea, vomiting, diarrhea, abdominal pain, blood in stool, hematemesis, jaundice and rectal bleeding. . ; ; Genitourinary: Negative for dysuria, flank pain and hematuria. ; ; Musculoskeletal: Negative for back pain and neck pain. Negative for swelling and trauma.; ; Skin: Negative for pruritus, rash, abrasions, blisters, bruising and skin lesion.; ; Neuro: +lightheadedness. Negative for headache and neck stiffness. Negative for weakness, altered level of consciousness, altered mental status,  extremity weakness, paresthesias, involuntary movement, seizure and syncope.       Physical Exam Updated Vital Signs BP 122/80   Pulse (!) 117   Resp (!) 27   Ht 5\' 7"  (1.702 m)   Wt 86.2 kg   SpO2 95%   BMI 29.76 kg/m   Patient Vitals for the past 24 hrs:  BP Temp Temp src Pulse Resp SpO2 Height Weight  04/29/18 1618 120/84 - - (!) 121 17 98 % - -  04/29/18 1600 (!) 136/94 - - (!) 121 18 98 % - -  04/29/18 1544 - 97.9 F (36.6 C) Oral - - - - -  04/29/18 1530 123/82 - - (!) 119 (!) 22 99 % - -  04/29/18 1445 135/73 - - (!) 124 16 97 % - -  04/29/18 1415 119/76 - - (!) 121 17 94 % - -  04/29/18 1400 129/79 - - (!) 121 (!) 21 95 % - -  04/29/18 1350 122/80 - - (!) 117 (!) 27 95 % - -  04/29/18 1329 - - - - - -  (1.702 m) 86.2 kg      Physical Exam 1345: Physical examination:  Nursing notes reviewed; Vital signs and O2 SAT reviewed;  Constitutional: Well developed, Well nourished, Well hydrated, Uncomfortable appearing; Head:  Normocephalic, atraumatic; Eyes: EOMI, PERRL, No scleral icterus; ENMT: Mouth and pharynx normal, Mucous membranes moist; Neck: Supple, Full range of motion, No lymphadenopathy; Cardiovascular: Tachycardic rate, regular rhythm, No gallop; Respiratory: Breath sounds diminished & equal bilaterally, No wheezes.  Speaking full sentences with ease, Normal respiratory effort/excursion; Chest: Nontender, Movement normal; Abdomen: Soft, Nontender, Nondistended, Normal bowel sounds; Genitourinary: No CVA tenderness; Extremities: Peripheral pulses normal, No tenderness, No edema, No calf edema or asymmetry.; Neuro: AA&Ox3, Major CN grossly intact.  Speech clear. No gross focal motor or sensory deficits in extremities.; Skin: Color normal, Warm, Dry.   ED Treatments / Results  Labs (all labs ordered are listed, but only abnormal results are displayed)   EKG EKG Interpretation  Date/Time:  Friday April 29 2018 13:37:13 EST EKG #1 Ventricular Rate:  219 PR  Interval:    QRS Duration: 72 QT Interval:  250 QTC Calculation: 478 R Axis:   11 Text Interpretation:  Supraventricular tachycardia ST depression, probably rate related Baseline wander When compared with ECG of 10/03/2017 Supraventricular tachycardia is now Present When compared with ECG of 10/03/2017 No significant change was found Confirmed by Samuel Jester [phone]) on 04/29/2018 1:59:05 PM    EKG Interpretation  Date/Time:  Friday April 29 2018 14:16:16 EST  EKG #2 Ventricular Rate:  122 PR Interval:    QRS Duration: 73 QT Interval:  327 QTC Calculation: 466 R Axis:   -8 Text Interpretation:  Sinus tachycardia Low voltage, precordial leads Since last tracing of earlier today Sinus tachycardia has replaced Supraventricular tachycardia Confirmed by Samuel Jester [phone]) on 04/29/2018 2:22:26 PM        EKG Interpretation  Date/Time:  Friday April 29 2018 16:27:15 EST  EKG #3 Ventricular Rate:  119 PR Interval:    QRS Duration: 72 QT Interval:  312 QTC Calculation: 439 R Axis:   13 Text Interpretation:  Sinus tachycardia Low voltage, precordial leads Since last tracing of earlier today No significant change was found Confirmed by Samuel Jester [phone]) on 04/29/2018 4:31:47 PM            Radiology   Procedures Procedures (including critical care time)  Medications Ordered in ED Medications  diltiazem (CARDIZEM) 100 mg in dextrose 5 % 100 mL (1 mg/mL) infusion (0 mg/hr Intravenous Stopped 04/29/18 1543)  diltiazem (CARDIZEM) injection 10 mg (10 mg Intravenous Given 04/29/18 1352)  diltiazem (CARDIZEM CD) 24 hr capsule 240 mg (240 mg Oral Given 04/29/18 1615)     Initial Impression / Assessment and Plan / ED Course  I have reviewed the triage vital signs and the nursing notes.  Pertinent labs & imaging results that were available during my care of the patient were reviewed by me and considered in my medical decision making (see chart for details).      MDM Reviewed: previous chart, nursing note and vitals Reviewed previous: labs and ECG Interpretation: labs, ECG and x-ray Total time providing critical care: 30-74 minutes. This excludes time spent performing separately reportable procedures and services. Consults: cardiology   CRITICAL CARE Performed by: Samuel Jester  Total critical care time: 35 minutes Critical care time was exclusive of separately billable procedures and treating other patients. Critical care was necessary to treat or prevent imminent or life-threatening deterioration. Critical care was time spent personally by me on the following activities: development of treatment plan with patient and/or surrogate as well as nursing, discussions with consultants, evaluation of patient's response to treatment, examination of patient, obtaining history from patient or surrogate, ordering and performing treatments and interventions, ordering and review of laboratory studies, ordering and review of radiographic studies, pulse oximetry and re-evaluation of patient's condition.   Results for orders placed or performed during the hospital encounter of 04/29/18  Basic metabolic panel  Result Value Ref Range   Sodium 140 135 - 145 mmol/L   Potassium 4.5 3.5 - 5.1 mmol/L   Chloride 108 98 - 111 mmol/L   CO2 21 (L) 22 - 32 mmol/L   Glucose, Bld 176 (H) 70 - 99 mg/dL   BUN 35 (H) 8 - 23 mg/dL   Creatinine, Ser 4.091.89 (H) 0.61 - 1.24 mg/dL   Calcium 8.4 (L) 8.9 - 10.3 mg/dL   GFR calc non Af Amer 35 (L) >60 mL/min   GFR calc Af Amer 40 (L) >60 mL/min   Anion gap 11 5 - 15  Brain natriuretic peptide  Result Value Ref Range   B Natriuretic Peptide 779.0 (H) 0.0 - 100.0 pg/mL  Troponin I - Once  Result Value Ref Range   Troponin I <0.03 <0.03 ng/mL  CBC with Differential  Result Value Ref Range   WBC 13.7 (H) 4.0 - 10.5 K/uL   RBC 4.98 4.22 - 5.81 MIL/uL   Hemoglobin 15.5 13.0 - 17.0 g/dL   HCT 81.150.4 91.439.0 - 78.252.0 %   MCV 101.2  (H) 80.0 - 100.0 fL   MCH 31.1 26.0 - 34.0 pg   MCHC 30.8 30.0 - 36.0 g/dL   RDW 95.613.4 21.311.5 - 08.615.5 %   Platelets 294 150 - 400 K/uL   nRBC 0.0 0.0 - 0.2 %   Neutrophils Relative % 90 %   Neutro Abs 12.3 (H) 1.7 - 7.7 K/uL   Lymphocytes Relative 5 %   Lymphs Abs 0.7 0.7 - 4.0 K/uL   Monocytes Relative 3 %   Monocytes Absolute 0.5 0.1 - 1.0 K/uL   Eosinophils Relative 1 %   Eosinophils Absolute 0.1 0.0 - 0.5 K/uL   Basophils Relative 0 %   Basophils Absolute 0.0 0.0 - 0.1 K/uL   Immature Granulocytes 1 %   Abs Immature Granulocytes 0.10 (H) 0.00 - 0.07 K/uL   Dg Chest Port 1 View Result Date: 04/29/2018 CLINICAL DATA:  Shortness of breath.  Palpitations. EXAM: PORTABLE CHEST 1 VIEW COMPARISON:  10/03/2017 FINDINGS: Heart size and pulmonary vascularity are normal. Lungs are clear. No significant bone abnormality. IMPRESSION: No active disease. Electronically Signed   By: Francene BoyersJames  Maxwell M.D.   On: 04/29/2018 14:29    1505:  Previous ED chart reviewed from 09/2017 when pt presented with similar symptoms: IV adenosine and cardioversion were both unsuccessful. IV cardizem bolus and gtt started. Pt's HR immediately slowed from 220 to 160's during IV bolus and pt stated he started to feel better. 2nd EKG now sinus tachycardia.  BNP elevated, no old to compare and CXR without overt CHF. BUN/Cr within pt's baseline range in Epic.  T/C returned from Cards Dr. Wyline MoodBranch, case discussed, including:  HPI, pertinent PM/SHx, VS/PE, dx testing, ED course and treatment:  Agreeable to  consult, requests to dose pt's home med PO long acting cardizem (not short acting) and wean off drip.   1630:  Pt states he "feels much better now" and wants to go home. Pt off cardizem drip, 3rd EKG with continued sinus tachycardia. Pt ambulated down hallway to bathroom without any complaints and again stated he "felt better."  Pt insistent he wants to go home, and his HR is his baseline (HR 117-120, sinus tachycardia on monitor,  during my re-exam).  T/C returned from Cards Dr. Wyline Mood, case discussed, including:  HPI, pertinent PM/SHx, VS/PE, dx testing, ED course and treatment:  Agrees OK with d/c, have pt continue his cardizem (not missing doses) and f/u in office. Dx and testing, as well as d/w Cards MD, d/w pt and family.  Questions answered.  Verb understanding, agreeable to d/c home with outpt f/u.      Final Clinical Impressions(s) / ED Diagnoses   Final diagnoses:  None    ED Discharge Orders    None       Samuel Jester, DO 05/01/18 2156

## 2018-04-29 NOTE — ED Notes (Signed)
EDP at bedside  

## 2018-04-29 NOTE — Progress Notes (Signed)
Patient discussed with Dr Clarene Duke, history of PSVT. Ran out of his home dilt few days ago, came in with SVT to 200s. Repsonded to IV diltiazem, currently on drip rates low 100s. Would restart his home dilt 240mg  and wean dilt gtt, if still requiring dilt gtt would admit overnight. This is not treatment failure, simply lack of compliance that led to his SVT exacerbation, from notes had done well when on his dilt at home.   Dina Rich MD

## 2018-05-04 ENCOUNTER — Ambulatory Visit (INDEPENDENT_AMBULATORY_CARE_PROVIDER_SITE_OTHER): Payer: PPO | Admitting: Family Medicine

## 2018-05-04 ENCOUNTER — Other Ambulatory Visit: Payer: Self-pay

## 2018-05-04 ENCOUNTER — Encounter: Payer: Self-pay | Admitting: Family Medicine

## 2018-05-04 VITALS — BP 118/82 | Ht 67.0 in | Wt 195.6 lb

## 2018-05-04 DIAGNOSIS — I10 Essential (primary) hypertension: Secondary | ICD-10-CM

## 2018-05-04 DIAGNOSIS — J42 Unspecified chronic bronchitis: Secondary | ICD-10-CM

## 2018-05-04 DIAGNOSIS — J441 Chronic obstructive pulmonary disease with (acute) exacerbation: Secondary | ICD-10-CM | POA: Diagnosis not present

## 2018-05-04 DIAGNOSIS — E782 Mixed hyperlipidemia: Secondary | ICD-10-CM

## 2018-05-04 MED ORDER — DOXYCYCLINE HYCLATE 100 MG PO TABS: 100.0000 mg | ORAL_TABLET | Freq: Two times a day (BID) | ORAL | 0 refills | Status: DC

## 2018-05-04 MED ORDER — LISINOPRIL 2.5 MG PO TABS: 2.5000 mg | ORAL_TABLET | Freq: Every day | ORAL | 3 refills | Status: DC

## 2018-05-04 MED ORDER — BUDESONIDE 0.5 MG/2ML IN SUSP: RESPIRATORY_TRACT | 12 refills | Status: DC

## 2018-05-04 MED ORDER — ALBUTEROL SULFATE (2.5 MG/3ML) 0.083% IN NEBU: 2.5000 mg | INHALATION_SOLUTION | RESPIRATORY_TRACT | 12 refills | Status: DC | PRN

## 2018-05-04 MED ORDER — PREDNISONE 10 MG PO TABS: ORAL_TABLET | ORAL | 0 refills | Status: DC

## 2018-05-04 MED ORDER — BUDESONIDE-FORMOTEROL FUMARATE 160-4.5 MCG/ACT IN AERO: 2.0000 | INHALATION_SPRAY | Freq: Two times a day (BID) | RESPIRATORY_TRACT | 4 refills | Status: DC

## 2018-05-04 MED ORDER — ALBUTEROL SULFATE HFA 108 (90 BASE) MCG/ACT IN AERS: INHALATION_SPRAY | RESPIRATORY_TRACT | 11 refills | Status: DC

## 2018-05-04 NOTE — Progress Notes (Addendum)
Subjective:    Patient ID: Blake Cherry, male    DOB: 04/22/1945, 73 y.o.   MRN: [id number]  Hypertension  This is a chronic problem. The current episode started more than 1 year ago. Pertinent negatives include no chest pain, headaches or shortness of breath. Risk factors for coronary artery disease include male gender. Treatments tried: lisinopril. There are no compliance problems.    Denies bringing up any severe phlegm.  Having a lot of coughing congestion wheezing.  Has history of COPD does smoke he knows he needs to quit he has been able to cut back on smoking.  Recent ER visit was reviewed with patient including lab work  Chronic kidney disease tries to keep his blood pressure under good control tries to do a good job of being healthy with his eating avoiding excessive salt  Patient with hyperlipidemia under good control previous labs reviewed with patient looks good     Review of Systems  Constitutional: Negative for activity change, fatigue and fever.  HENT: Negative for congestion and rhinorrhea.   Respiratory: Positive for wheezing. Negative for cough and shortness of breath.   Cardiovascular: Negative for chest pain and leg swelling.  Gastrointestinal: Negative for abdominal pain, diarrhea and nausea.  Genitourinary: Negative for dysuria and hematuria.  Neurological: Negative for weakness and headaches.  Psychiatric/Behavioral: Negative for agitation and behavioral problems.       Objective:   Physical Exam Vitals signs reviewed.  Constitutional:      General: He is not in acute distress. HENT:     Head: Normocephalic and atraumatic.  Eyes:     General:        Right eye: No discharge.        Left eye: No discharge.  Neck:     Trachea: No tracheal deviation.  Cardiovascular:     Rate and Rhythm: Normal rate and regular rhythm.     Heart sounds: Normal heart sounds. No murmur.  Pulmonary:     Effort: Pulmonary effort is normal. No respiratory distress.     Breath sounds: Wheezing present.  Lymphadenopathy:     Cervical: No cervical adenopathy.  Skin:    General: Skin is warm and dry.  Neurological:     Mental Status: He is alert.     Coordination: Coordination normal.  Psychiatric:        Behavior: Behavior normal.           Assessment & Plan:  COPD exacerbation recommend doxycycline as well as prednisone patient states he can get this 1 or 2 days to see if he gets better if it does not get better or if he gets worse he will start medication he will use his inhalers  Refills of all his inhalers were sent in  Blood pressure under good control continue current measures  Recent SVT resolved heart rate is normal today  Hyperlipidemia most previous cholesterol looks good no need for any medications there  HTN- Patient was seen today as part of a visit regarding hypertension. The importance of healthy diet and regular physical activity was discussed. The importance of compliance with medications discussed.  Ideal goal is to keep blood pressure low elevated levels certainly below 140/90 when possible.  The patient was counseled that keeping blood pressure under control lessen his risk of complications.  The importance of regular follow-ups was discussed with the patient.  Low-salt diet such as DASH recommended.  Regular physical activity was recommended as well.  Patient was  advised to keep regular follow-ups. Blood pressure under good control continue current measures  25 minutes was spent with the patient.  This statement verifies that 25 minutes was indeed spent with the patient.  More than 50% of this visit-total duration of the visit-was spent in counseling and coordination of care. The issues that the patient came in for today as reflected in the diagnosis (s) please refer to documentation for further details.  No sign of CHF

## 2018-05-04 NOTE — Addendum Note (Signed)
Addended by: Lilyan Punt A on: 05/04/2018 09:44 AM   Modules accepted: Level of Service

## 2018-05-18 ENCOUNTER — Other Ambulatory Visit: Payer: Self-pay | Admitting: Student

## 2018-05-18 ENCOUNTER — Telehealth: Payer: Self-pay | Admitting: Student

## 2018-05-18 NOTE — Telephone Encounter (Signed)
   Cardiac Questionnaire:    Since your last visit or hospitalization:    1. Have you been having new or worsening chest pain? No   2. Have you been having new or worsening shortness of breath? No 3. Have you been having new or worsening leg swelling, wt gain, or increase in abdominal girth (pants fitting more tightly)? No   4. Have you had any passing out spells? No    *A YES to any of these questions would result in the appointment being kept. *If all the answers to these questions are NO, we should indicate that given the current situation regarding the worldwide coronarvirus pandemic, at the recommendation of the CDC, we are looking to limit gatherings in our waiting area, and thus will reschedule their appointment beyond four weeks from today.   _____________    He reports overall doing well since his recent ED visit. Says he had been without his medications for several days leading up to his episode but has since been compliant with Cardizem. No recurrent dizziness or palpitations. Does not need any refills at this time per his report. Can cancel visit for 3/27 and reschedule 6-8 weeks from now.    Signed, Ellsworth Lennox, PA-C 05/18/2018, 3:50 PM Pager: [phone]

## 2018-05-20 ENCOUNTER — Ambulatory Visit: Payer: PPO | Admitting: Student

## 2018-06-13 ENCOUNTER — Other Ambulatory Visit: Payer: Self-pay

## 2018-06-13 ENCOUNTER — Ambulatory Visit (INDEPENDENT_AMBULATORY_CARE_PROVIDER_SITE_OTHER): Payer: PPO | Admitting: Family Medicine

## 2018-06-13 DIAGNOSIS — J441 Chronic obstructive pulmonary disease with (acute) exacerbation: Secondary | ICD-10-CM

## 2018-06-13 MED ORDER — AMOXICILLIN-POT CLAVULANATE 875-125 MG PO TABS: 1.0000 | ORAL_TABLET | Freq: Two times a day (BID) | ORAL | 0 refills | Status: DC

## 2018-06-13 MED ORDER — PREDNISONE 10 MG PO TABS: ORAL_TABLET | ORAL | 0 refills | Status: DC

## 2018-06-13 NOTE — Progress Notes (Signed)
   Subjective:    Patient ID: Blake Cherry, male    DOB: 29-Mar-1945, 73 y.o.   MRN: [id number] Telephone visit Video was not possible for the patient Coronavirus outbreak  HPI COPD.productive cough, Sob and wheezing. No fever.  Started several days ago. Doing neb treatments about 3 a day. Feels like he could do 4 a day.  Patient relates 3 to 4 days of increased congestion coughing chest congestion in addition this having no fever and chills he denies any vomiting diarrhea.  He states he does get out of breath more easily.  Coughing up a lot of discolored phlegm.  Denies body aches chest pain.  Has a history of COPD. Virtual Visit via Telephone Note  I connected with Blake Cherry on 06/13/18 at  3:50 PM EDT by telephone and verified that I am speaking with the correct person using two identifiers.   I discussed the limitations, risks, security and privacy concerns of performing an evaluation and management service by telephone and the availability of in person appointments. I also discussed with the patient that there may be a patient responsible charge related to this service. The patient expressed understanding and agreed to proceed.   History of Present Illness:    Observations/Objective:   Assessment and Plan:   Follow Up Instructions:    I discussed the assessment and treatment plan with the patient. The patient was provided an opportunity to ask questions and all were answered. The patient agreed with the plan and demonstrated an understanding of the instructions.   The patient was advised to call back or seek an in-person evaluation if the symptoms worsen or if the condition fails to improve as anticipated.  I provided 15 minutes of non-face-to-face time during this encounter.       Review of Systems     Objective:   Physical Exam        Assessment & Plan:  COPD exacerbation Very important for the patient be on antibiotics as well as prednisone taper  Continue all his inhalers and steroid inhaler as well Patient should gradually improve over the course the next several days He was told that if he starts having high fever body aches stiff discomfort breathing or other problems notify us immediately for recheck here or ER

## 2018-06-21 ENCOUNTER — Ambulatory Visit: Payer: PPO | Admitting: Student

## 2018-07-14 ENCOUNTER — Telehealth: Payer: Self-pay | Admitting: Student

## 2018-07-14 NOTE — Telephone Encounter (Signed)
Virtual Visit Pre-Appointment Phone Call  "(Name), I am calling you today to discuss your upcoming appointment. We are currently trying to limit exposure to the virus that causes COVID-19 by seeing patients at home rather than in the office."  1. "What is the BEST phone number to call the day of the visit?" - include this in appointment notes  2. Do you have or have access to (through a family member/friend) a smartphone with video capability that we can use for your visit?" a. If yes - list this number in appt notes as cell (if different from BEST phone #) and list the appointment type as a VIDEO visit in appointment notes b. If no - list the appointment type as a PHONE visit in appointment notes  Confirm consent - "In the setting of the current Covid19 crisis, you are scheduled for a (phone or video) visit with your provider on (date) at (time).  Just as we do with many in-office visits, in order for you to participate in this visit, we must obtain consent.  If you'd like, I can send this to your mychart (if signed up) or email for you to review.  Otherwise, I can obtain your verbal consent now.  All virtual visits are billed to your insurance company just like a normal visit would be.  By agreeing to a virtual visit, we'd like you to understand that the technology does not allow for your provider to perform an examination, and thus may limit your provider's ability to fully assess your condition. If your provider identifies any concerns that need to be evaluated in person, we will make arrangements to do so.  Finally, though the technology is pretty good, we cannot assure that it will always work on either your or our end, and in the setting of a video visit, we may have to convert it to a phone-only visit.  In either situation, we cannot ensure that we have a secure connection.  Are you willing to proceed?" STAFF: Did the patient verbally acknowledge consent to telehealth visit? Document  YES/NO here: Yes  3. Advise patient to be prepared - "Two hours prior to your appointment, go ahead and check your blood pressure, pulse, oxygen saturation, and your weight (if you have the equipment to check those) and write them all down. When your visit starts, your provider will ask you for this information. If you have an Apple Watch or Kardia device, please plan to have heart rate information ready on the day of your appointment. Please have a pen and paper handy nearby the day of the visit as well."  4. Give patient instructions for MyChart download to smartphone OR [email or web address] as below if video visit (depending on what platform provider is using)  5. Inform patient they will receive a phone call 15 minutes prior to their appointment time (may be from unknown caller ID) so they should be prepared to answer    TELEPHONE CALL NOTE  Blake Cherry has been deemed a candidate for a follow-up tele-health visit to limit community exposure during the Covid-19 pandemic. I spoke with the patient via phone to ensure availability of phone/video source, confirm preferred email & phone number, and discuss instructions and expectations.  I reminded Blake Cherry to be prepared with any vital sign and/or heart rhythm information that could potentially be obtained via home monitoring, at the time of his visit. I reminded Blake Cherry to expect a phone call prior to his  visit.  [name] 07/14/2018 11:23 AM

## 2018-07-21 ENCOUNTER — Telehealth (INDEPENDENT_AMBULATORY_CARE_PROVIDER_SITE_OTHER): Payer: PPO | Admitting: Student

## 2018-07-21 ENCOUNTER — Other Ambulatory Visit: Payer: Self-pay

## 2018-07-21 ENCOUNTER — Encounter: Payer: Self-pay | Admitting: Student

## 2018-07-21 VITALS — BP 120/72 | HR 78 | Ht 67.0 in | Wt 195.0 lb

## 2018-07-21 DIAGNOSIS — I714 Abdominal aortic aneurysm, without rupture, unspecified: Secondary | ICD-10-CM

## 2018-07-21 DIAGNOSIS — I471 Supraventricular tachycardia: Secondary | ICD-10-CM | POA: Diagnosis not present

## 2018-07-21 DIAGNOSIS — Z7189 Other specified counseling: Secondary | ICD-10-CM

## 2018-07-21 DIAGNOSIS — I10 Essential (primary) hypertension: Secondary | ICD-10-CM

## 2018-07-21 DIAGNOSIS — J449 Chronic obstructive pulmonary disease, unspecified: Secondary | ICD-10-CM

## 2018-07-21 NOTE — Patient Instructions (Addendum)
Medication Instructions:  Your physician recommends that you continue on your current medications as directed. Please refer to the Current Medication list given to you today.   Labwork: NONE  Testing/Procedures: NONE  Follow-Up: Your physician wants you to follow-up in: December with Dr. Purvis Sheffield. You will receive a reminder letter in the mail two months in advance. If you don't receive a letter, please call our office to schedule the follow-up appointment.   Any Other Special Instructions Will Be Listed Below (If Applicable).     If you need a refill on your cardiac medications before your next appointment, please call your pharmacy.  Thank you for choosing [facility]!

## 2018-07-21 NOTE — Progress Notes (Signed)
Virtual Visit via Telephone Note   This visit type was conducted due to national recommendations for restrictions regarding the COVID-19 Pandemic (e.g. social distancing) in an effort to limit this patient's exposure and mitigate transmission in our community.  Due to his co-morbid illnesses, this patient is at least at moderate risk for complications without adequate follow up.  This format is felt to be most appropriate for this patient at this time.  The patient did not have access to video technology/had technical difficulties with video requiring transitioning to audio format only (telephone).  All issues noted in this document were discussed and addressed.  No physical exam could be performed with this format.  Please refer to the patient's chart for his  consent to telehealth for [facility].   Date:  07/21/2018   ID:  Blake Cherry, DOB 1945-08-11, MRN [id number]  Patient Location: Home Provider Location: Office  PCP:  [name], MD  Cardiologist:  Prentice Docker, MD  Electrophysiologist:  None   Evaluation Performed:  Follow-Up Visit  Chief Complaint: Follow-up from Emergency Department Visit  History of Present Illness:    Blake Cherry is a 73 y.o. male with past medical history of HTN, pSVT, COPD, AAA (s/p repair in 2014), and prior CVA who presents for a telehealth visit in regards to Emergency Department follow-up.   He was last examined by Dr. Purvis Sheffield in 01/2018 and denied any recent chest pain or dyspnea on exertion. Was continued on Cardizem CD 240mg  daily and informed to follow-up in 1 year.   In the interim, he presented to [facility] ED on 04/29/2018 for evaluation of worsening dizziness and palpitations for the past 3 days in the setting of having missed several doses of his Cardizem CD. Was found to be in SVT with HR in the 200's. He was given an IV Cardizem bolus and repeat bolus with improvement in his rates. He was initially started on IV Cardizem  but this was weaned while in the ED following administration of his PO Cardizem CD and he was discharged home. He initially had a follow-up visit on 05/20/2018 but this was cancelled due to COVD-19. He was called at that time and reported overall doing well and denied any recurrent symptoms.   In talking with the patient today, he reports overall doing well from a cardiac perspective since his ED visit in 04/2018. He denies any recurrent palpitations. Has been following his HR and BP a home and both have remained in a stable range. He denies any recent chest pain, orthopnea, PND, or lower extremity edema. Has baseline dyspnea on exertion in the setting of COPD but denies any recent changes in his symptoms.   The patient and his wife have been wearing masks when they go out in public. He has enjoyed getting to attend "drive-up" church services.   The patient does not have symptoms concerning for COVID-19 infection (fever, chills, cough, or new shortness of breath).    Past Medical History:  Diagnosis Date   AAA (abdominal aortic aneurysm) (HCC)    Carotid artery occlusion 2007   Left cea   COPD (chronic obstructive pulmonary disease) (HCC)    Diverticulitis    last time 4 years ago   Hypertension    per Dr Tenny Craw   Shortness of breath    with exertion   Stroke (HCC) 05/2005    2 in 2007.  Weakness and numbness in left arm and leg   SVT (supraventricular  tachycardia) [facility])    Past Surgical History:  Procedure Laterality Date   ABDOMINAL AORTAGRAM N/A 03/10/2013   Procedure: ABDOMINAL Ronny Flurry;  Surgeon: Sherren Kerns, MD;  Location: [facility] CATH LAB;  Service: Cardiovascular;  Laterality: N/A;   ABDOMINAL AORTIC ENDOVASCULAR STENT GRAFT N/A 08/22/2012   Procedure: ABDOMINAL AORTIC ENDOVASCULAR STENT GRAFT;  Surgeon: Sherren Kerns, MD;  Location: [facility] OR;  Service: Vascular;  Laterality: N/A;   CAROTID ENDARTERECTOMY Left 2007   CAROTID STENT  2007   left internal carotid artery     TESTICLE TORSION REDUCTION     age of 37     Current Meds  Medication Sig   albuterol (PROVENTIL HFA;VENTOLIN HFA) 108 (90 Base) MCG/ACT inhaler INHALE 2 PUFFS BY MOUTH EVERY 6 HOURS AS NEEDED FOR WHEEZING OR SHORTNESS OF BREATH   albuterol (PROVENTIL) (2.5 MG/3ML) 0.083% nebulizer solution Take 3 mLs (2.5 mg total) by nebulization every 4 (four) hours as needed for wheezing or shortness of breath.   aspirin EC 81 MG tablet Take 81 mg by mouth daily.   budesonide (PULMICORT) 0.5 MG/2ML nebulizer solution USE 1 VIAL IN NEBULIZER TWICE DAILY   budesonide-formoterol (SYMBICORT) 160-4.5 MCG/ACT inhaler Inhale 2 puffs into the lungs 2 (two) times daily.   diltiazem (CARDIZEM CD) 240 MG 24 hr capsule Take 1 capsule (240 mg total) by mouth daily.   guaiFENesin (MUCINEX) 600 MG 12 hr tablet Take 1 tablet (600 mg total) by mouth 2 (two) times daily. (Patient taking differently: Take 600 mg by mouth as needed. )   lisinopril (PRINIVIL,ZESTRIL) 2.5 MG tablet Take 1 tablet (2.5 mg total) by mouth daily.   triamcinolone cream (KENALOG) 0.1 % Apply 1 application topically 2 (two) times daily. (Patient taking differently: Apply 1 application topically as needed. )     Allergies:   Patient has no known allergies.   Social History   Tobacco Use   Smoking status: Former Smoker    Packs/day: 1.00    Years: 50.00    Pack years: 50.00    Types: Cigarettes    Last attempt to quit: 10/01/2017    Years since quitting: 0.8   Smokeless tobacco: Former Neurosurgeon    Types: Snuff  Substance Use Topics   Alcohol use: No    Alcohol/week: 0.0 standard drinks   Drug use: No     Family Hx: The patient's family history includes Cancer in his mother; Diabetes in his father and mother; Hypertension in his mother.  ROS:   Please see the history of present illness.     All other systems reviewed and are negative.   Prior CV studies:   The following studies were reviewed today:  Echocardiogram:  08/2014 Study Conclusions  - Left ventricle: The cavity size was normal. Wall thickness was   increased in a pattern of mild LVH. Systolic function was normal.   The estimated ejection fraction was in the range of 55% to 60%.   Indeterminant diastolic dysfunction. Wall motion was normal;   there were no regional wall motion abnormalities. - Aortic valve: Mildly calcified annulus. Mildly thickened   leaflets. Valve area (VTI): 3.31 cm^2. Valve area (Vmax): 3.16   cm^2. - Mitral valve: Mildly calcified annulus. Mildly thickened leaflets   . - Atrial septum: There was increased thickness of the septum,   consistent with lipomatous hypertrophy. No defect or patent   foramen ovale was identified. - Technically difficult study.  NST: 10/2017  There was no ST segment deviation noted  during stress.  The study is normal. There are no perfusion defects consistent with prior infarct or current ischemia.  This is a low risk study.  The left ventricular ejection fraction is hyperdynamic (>65%).  Labs/Other Tests and Data Reviewed:    EKG:  No ECG reviewed.  Recent Labs: 10/03/2017: ALT 11; TSH 1.180 04/29/2018: B Natriuretic Peptide 779.0; BUN 35; Creatinine, Ser 1.89; Hemoglobin 15.5; Magnesium 2.0; Platelets 294; Potassium 4.5; Sodium 140   Recent Lipid Panel Lab Results  Component Value Date/Time   CHOL 146 10/27/2017 08:41 AM   TRIG 63 10/27/2017 08:41 AM   HDL 45 10/27/2017 08:41 AM   CHOLHDL 3.2 10/27/2017 08:41 AM   CHOLHDL 3.6 10/27/2013 07:31 AM   LDLCALC 88 10/27/2017 08:41 AM    Wt Readings from Last 3 Encounters:  07/21/18 195 lb (88.5 kg)  05/04/18 195 lb 9.6 oz (88.7 kg)  04/29/18 190 lb (86.2 kg)     Objective:    Vital Signs:  BP 120/72    Pulse 78    Ht 5\' 7"  (1.702 m)    Wt 195 lb (88.5 kg) Comment: pt states   BMI 30.54 kg/m    General: Pleasant male sounding in NAD Psych: Normal affect. Neuro: Alert and oriented X 3.  Lungs:  Resp regular and  unlabored while talking on the phone.   ASSESSMENT & PLAN:    1. pSVT - recently evaluated in the ED and found to be in SVT in the setting of having missed several doses of Cardizem CD. He successfully converted with IV Cardizem.  - he denies any recurrent palpitations or dizziness since. HR has been well-controlled when checked at home and he has been compliant with Cardizem CD. Continue at current dosing of 240mg  daily.   2. HTN - BP has been well-controlled when checked at home, at 120/72 on most recent check. - continue Cardizem CD 240mg  daily and Lisinopril 2.5mg  daily.   3. AAA - s/p repair in 2014 by Dr. Darrick PennaFields. Followed by Vascular Surgery with imaging in 2018 showing no significant change. He is due for repeat routine imaging. Encouraged the patient to reach out to Dr. Evelina DunField's office to arrange this once the COVID-19 situation has improved.    4. COPD - he denies any recent change in his symptoms. Breathing has been at baseline. Followed by PCP.   5. COVID-19 Education - The signs and symptoms of COVID-19 were discussed with the patient. The importance of social distancing was discussed today.  Time:   Today, I have spent 18 minutes with the patient with telehealth technology discussing the above problems.     Medication Adjustments/Labs and Tests Ordered: Current medicines are reviewed at length with the patient today.  Concerns regarding medicines are outlined above.   Tests Ordered: No orders of the defined types were placed in this encounter.   Medication Changes: No orders of the defined types were placed in this encounter.   Disposition: Keep Annual Follow-up with Dr. Purvis SheffieldKoneswaran in 01/2019.  Signed, Ellsworth LennoxBrittany M Aryanne Gilleland, PA-C  07/21/2018 3:37 PM    [facility]

## 2018-08-25 DIAGNOSIS — J449 Chronic obstructive pulmonary disease, unspecified: Secondary | ICD-10-CM | POA: Diagnosis not present

## 2018-08-25 DIAGNOSIS — J301 Allergic rhinitis due to pollen: Secondary | ICD-10-CM | POA: Diagnosis not present

## 2018-08-25 DIAGNOSIS — F17200 Nicotine dependence, unspecified, uncomplicated: Secondary | ICD-10-CM | POA: Diagnosis not present

## 2018-08-25 DIAGNOSIS — I10 Essential (primary) hypertension: Secondary | ICD-10-CM | POA: Diagnosis not present

## 2018-09-28 DIAGNOSIS — H2513 Age-related nuclear cataract, bilateral: Secondary | ICD-10-CM | POA: Diagnosis not present

## 2018-09-28 DIAGNOSIS — H5213 Myopia, bilateral: Secondary | ICD-10-CM | POA: Diagnosis not present

## 2018-09-28 DIAGNOSIS — H52203 Unspecified astigmatism, bilateral: Secondary | ICD-10-CM | POA: Diagnosis not present

## 2018-09-28 DIAGNOSIS — H524 Presbyopia: Secondary | ICD-10-CM | POA: Diagnosis not present

## 2018-09-28 DIAGNOSIS — H353131 Nonexudative age-related macular degeneration, bilateral, early dry stage: Secondary | ICD-10-CM | POA: Diagnosis not present

## 2018-10-05 DIAGNOSIS — H21562 Pupillary abnormality, left eye: Secondary | ICD-10-CM | POA: Diagnosis not present

## 2018-10-05 DIAGNOSIS — H5703 Miosis: Secondary | ICD-10-CM | POA: Diagnosis not present

## 2018-10-05 DIAGNOSIS — H2512 Age-related nuclear cataract, left eye: Secondary | ICD-10-CM | POA: Diagnosis not present

## 2018-10-11 ENCOUNTER — Other Ambulatory Visit: Payer: Self-pay | Admitting: Physician Assistant

## 2018-10-12 DIAGNOSIS — H2511 Age-related nuclear cataract, right eye: Secondary | ICD-10-CM | POA: Diagnosis not present

## 2018-11-04 ENCOUNTER — Encounter: Payer: PPO | Admitting: Family Medicine

## 2018-11-07 ENCOUNTER — Other Ambulatory Visit: Payer: Self-pay

## 2018-11-07 ENCOUNTER — Encounter: Payer: Self-pay | Admitting: Family Medicine

## 2018-11-07 ENCOUNTER — Ambulatory Visit (INDEPENDENT_AMBULATORY_CARE_PROVIDER_SITE_OTHER): Payer: PPO | Admitting: Family Medicine

## 2018-11-07 VITALS — BP 102/60 | Temp 97.4°F | Ht 66.5 in | Wt 192.2 lb

## 2018-11-07 DIAGNOSIS — Z1211 Encounter for screening for malignant neoplasm of colon: Secondary | ICD-10-CM

## 2018-11-07 DIAGNOSIS — J42 Unspecified chronic bronchitis: Secondary | ICD-10-CM | POA: Diagnosis not present

## 2018-11-07 DIAGNOSIS — N289 Disorder of kidney and ureter, unspecified: Secondary | ICD-10-CM | POA: Diagnosis not present

## 2018-11-07 DIAGNOSIS — E785 Hyperlipidemia, unspecified: Secondary | ICD-10-CM

## 2018-11-07 DIAGNOSIS — Z0000 Encounter for general adult medical examination without abnormal findings: Secondary | ICD-10-CM | POA: Diagnosis not present

## 2018-11-07 DIAGNOSIS — D692 Other nonthrombocytopenic purpura: Secondary | ICD-10-CM | POA: Diagnosis not present

## 2018-11-07 DIAGNOSIS — Z125 Encounter for screening for malignant neoplasm of prostate: Secondary | ICD-10-CM | POA: Diagnosis not present

## 2018-11-07 MED ORDER — BUDESONIDE-FORMOTEROL FUMARATE 160-4.5 MCG/ACT IN AERO: 2.0000 | INHALATION_SPRAY | Freq: Two times a day (BID) | RESPIRATORY_TRACT | 6 refills | Status: DC

## 2018-11-07 MED ORDER — ZOSTER VAC RECOMB ADJUVANTED 50 MCG/0.5ML IM SUSR: 0.5000 mL | Freq: Once | INTRAMUSCULAR | 1 refills | Status: AC

## 2018-11-07 NOTE — Progress Notes (Signed)
Subjective:    Patient ID: Blake Cherry, male    DOB: 12/16/1945, 73 y.o.   MRN: [id number]  HPI AWV- Annual Wellness Visit  The patient was seen for their annual wellness visit. The patient's past medical history, surgical history, and family history were reviewed. Pertinent vaccines were reviewed ( tetanus, pneumonia, shingles, flu) The patient's medication list was reviewed and updated.  The height and weight were entered.  BMI recorded in electronic record elsewhere  Cognitive screening was completed. Outcome of Mini - Cog: yes   Falls /depression screening electronically recorded within record elsewhere  Current tobacco usage: yes (All patients who use tobacco were given written and verbal information on quitting)  Recent listing of emergency department/hospitalizations over the past year were reviewed.  current specialist the patient sees on a regular basis: Dr. Juanetta GoslingHawkins, pulmonology, Dr. Kirtland BouchardK - cardiology   Medicare annual wellness visit patient questionnaire was reviewed.  A written screening schedule for the patient for the next 5-10 years was given. Appropriate discussion of followup regarding next visit was discussed.    Patient doe relates bruising on his arms probably related to the aspirin diagnosis as he is gotten older he does have COPD  Patient with stable COPD but needs refills on his Symbicort he is requesting generic he states his breathing has been trouble he is trying to do the best he can trying to minimize smoking he cannot quit he smokes 4 cigarettes a day  Patient with moderate abuse obesity try as best he can watch diet and stay physically active but he is very limited because of his COPD  Patient with history of renal insufficiency tries to drink plenty of water daily avoid excessive protein   Review of Systems  Constitutional: Negative for diaphoresis and fatigue.  HENT: Negative for congestion and rhinorrhea.   Respiratory: Positive for  shortness of breath. Negative for cough.   Cardiovascular: Negative for chest pain and leg swelling.  Gastrointestinal: Negative for abdominal pain and diarrhea.  Skin: Negative for color change and rash.  Neurological: Negative for dizziness and headaches.  Psychiatric/Behavioral: Negative for behavioral problems and confusion.       Objective:   Physical Exam Vitals signs reviewed.  Constitutional:      General: He is not in acute distress. HENT:     Head: Normocephalic and atraumatic.  Eyes:     General:        Right eye: No discharge.        Left eye: No discharge.  Neck:     Trachea: No tracheal deviation.  Cardiovascular:     Rate and Rhythm: Normal rate and regular rhythm.     Heart sounds: Normal heart sounds. No murmur.  Pulmonary:     Effort: Pulmonary effort is normal. No respiratory distress.     Breath sounds: Normal breath sounds. No wheezing.  Lymphadenopathy:     Cervical: No cervical adenopathy.  Skin:    General: Skin is warm.     Findings: No rash.  Neurological:     Mental Status: He is alert.  Psychiatric:        Behavior: Behavior normal.   Prostate exam normal        Assessment & Plan:  Adult wellness-complete.wellness physical was conducted today. Importance of diet and exercise were discussed in detail.  In addition to this a discussion regarding safety was also covered. We also reviewed over immunizations and gave recommendations regarding current immunization needed for age.  In addition to this additional areas were also touched on including: Preventative health exams needed:  Colonoscopy not wanting to do currently will do the stool testing for blood  Patient was advised yearly wellness exam  1. Medicare annual wellness visit, subsequent Please see above - Lipid panel - PSA - Basic metabolic panel - CBC with Differential/Platelet - IFOBT POC (occult bld, rslt in office); Future  2. Renal insufficiency We will check metabolic  panel avoid excessive protein - Basic metabolic panel  3. Purpura senilis (HCC) New bruising on his arm consistent with senile purpura patient was told regarding what this means  4. Chronic bronchitis, unspecified chronic bronchitis type ([place]) COPD stable yet short of breath with minimal activity we did discuss avoiding COVID using his medicines and notifying us if any ongoing troubles  5. Hyperlipidemia, unspecified hyperlipidemia type History hyperlipidemia check lipid profile - Lipid panel  6. Screening for prostate cancer Screening for prostate cancer - PSA  7. Screen for colon cancer Screening for colon cancer - IFOBT POC (occult bld, rslt in office); Future

## 2018-11-12 ENCOUNTER — Other Ambulatory Visit: Payer: Self-pay | Admitting: Family Medicine

## 2018-12-12 ENCOUNTER — Other Ambulatory Visit: Payer: Self-pay | Admitting: *Deleted

## 2018-12-12 DIAGNOSIS — F17210 Nicotine dependence, cigarettes, uncomplicated: Secondary | ICD-10-CM

## 2018-12-12 DIAGNOSIS — Z87891 Personal history of nicotine dependence: Secondary | ICD-10-CM

## 2018-12-12 DIAGNOSIS — Z122 Encounter for screening for malignant neoplasm of respiratory organs: Secondary | ICD-10-CM

## 2018-12-14 ENCOUNTER — Other Ambulatory Visit: Payer: Self-pay | Admitting: *Deleted

## 2018-12-14 ENCOUNTER — Ambulatory Visit (INDEPENDENT_AMBULATORY_CARE_PROVIDER_SITE_OTHER): Payer: PPO | Admitting: Family Medicine

## 2018-12-14 DIAGNOSIS — Z20822 Contact with and (suspected) exposure to covid-19: Secondary | ICD-10-CM

## 2018-12-14 DIAGNOSIS — J441 Chronic obstructive pulmonary disease with (acute) exacerbation: Secondary | ICD-10-CM | POA: Diagnosis not present

## 2018-12-14 MED ORDER — PREDNISONE 20 MG PO TABS: ORAL_TABLET | ORAL | 0 refills | Status: DC

## 2018-12-14 MED ORDER — AZITHROMYCIN 250 MG PO TABS: ORAL_TABLET | ORAL | 0 refills | Status: DC

## 2018-12-14 NOTE — Progress Notes (Signed)
   Subjective:    Patient ID: Blake Cherry, male    DOB: [birth year], 73 y.o.   MRN: [id number]  HPI Pt having cough and shortness of breath. Pt states these symptoms have become worse in the past few days. Patient is a smoker Trying to quit Has repetitive COPD exacerbations This is been present over the past few days with coughing wheezing some shortness of breath Denies high fever chills body aches diarrhea.  Tries to avoid being around crowds.  PMH COPD  Review of Systems  Constitutional: Negative for activity change, chills and fever.  HENT: Positive for congestion and rhinorrhea. Negative for ear pain.   Eyes: Negative for discharge.  Respiratory: Positive for cough, shortness of breath and wheezing.   Cardiovascular: Negative for chest pain and leg swelling.  Gastrointestinal: Negative for nausea and vomiting.  Musculoskeletal: Negative for arthralgias.       Objective:   Physical Exam Vitals signs and nursing note reviewed.  Constitutional:      Appearance: He is well-developed.  HENT:     Head: Normocephalic.     Mouth/Throat:     Pharynx: No oropharyngeal exudate.  Neck:     Musculoskeletal: Normal range of motion.  Cardiovascular:     Rate and Rhythm: Normal rate and regular rhythm.     Heart sounds: Normal heart sounds. No murmur.  Pulmonary:     Effort: Pulmonary effort is normal.     Breath sounds: Wheezing present.  Lymphadenopathy:     Cervical: No cervical adenopathy.  Skin:    General: Skin is warm and dry.  Neurological:     Motor: No abnormal muscle tone.     Temp 98.8  O2 sat 90%     Assessment & Plan:  Exacerbation of COPD Prednisone taper Z-Pak as directed Warning signs were discussed His oxygen saturation is 90 where it normally is approximately 92 I told him if he starts having difficulty breathing or getting worse ER is the next step Also told the patient if he starts having high fevers difficulty breathing go to ER I did advise  the patient to do Covid testing and to stay at home

## 2018-12-16 LAB — NOVEL CORONAVIRUS, NAA: SARS-CoV-2, NAA: NOT DETECTED

## 2018-12-29 ENCOUNTER — Ambulatory Visit (HOSPITAL_COMMUNITY)
Admission: RE | Admit: 2018-12-29 | Discharge: 2018-12-29 | Disposition: A | Discharge status: Home / Self Care | Payer: PPO | Source: Ambulatory Visit | Attending: Acute Care | Admitting: Acute Care

## 2018-12-29 ENCOUNTER — Other Ambulatory Visit: Payer: Self-pay

## 2018-12-29 DIAGNOSIS — Z122 Encounter for screening for malignant neoplasm of respiratory organs: Secondary | ICD-10-CM | POA: Diagnosis not present

## 2018-12-29 DIAGNOSIS — F17210 Nicotine dependence, cigarettes, uncomplicated: Secondary | ICD-10-CM | POA: Diagnosis not present

## 2018-12-29 DIAGNOSIS — Z87891 Personal history of nicotine dependence: Secondary | ICD-10-CM | POA: Insufficient documentation

## 2019-01-05 ENCOUNTER — Other Ambulatory Visit: Payer: Self-pay | Admitting: *Deleted

## 2019-01-05 DIAGNOSIS — Z122 Encounter for screening for malignant neoplasm of respiratory organs: Secondary | ICD-10-CM

## 2019-01-05 DIAGNOSIS — F17210 Nicotine dependence, cigarettes, uncomplicated: Secondary | ICD-10-CM

## 2019-01-05 DIAGNOSIS — Z87891 Personal history of nicotine dependence: Secondary | ICD-10-CM

## 2019-01-11 ENCOUNTER — Ambulatory Visit (INDEPENDENT_AMBULATORY_CARE_PROVIDER_SITE_OTHER): Payer: PPO | Admitting: Acute Care

## 2019-01-11 ENCOUNTER — Other Ambulatory Visit: Payer: Self-pay

## 2019-01-11 ENCOUNTER — Telehealth: Payer: Self-pay

## 2019-01-11 ENCOUNTER — Encounter: Payer: Self-pay | Admitting: Acute Care

## 2019-01-11 DIAGNOSIS — Z720 Tobacco use: Secondary | ICD-10-CM

## 2019-01-11 DIAGNOSIS — R918 Other nonspecific abnormal finding of lung field: Secondary | ICD-10-CM

## 2019-01-11 DIAGNOSIS — J42 Unspecified chronic bronchitis: Secondary | ICD-10-CM

## 2019-01-11 NOTE — Telephone Encounter (Signed)
Rodena Piety can you please make sure pt has an OV visit with SG before he has his Low dose CT in 6 months. SG wants to make sure he doesn't have an infection before his scan so she would need to see him 2 weeks prior to scan date. If you have any questions, let me know. Thanks

## 2019-01-11 NOTE — Progress Notes (Signed)
Virtual Visit via Telephone Note  I connected with Blake Cherry on 01/11/19 at  3:00 PM EST by telephone and verified that I am speaking with the correct person using two identifiers.  Location: Patient: At home Provider: At the office at [place] [place], [place]   I discussed the limitations, risks, security and privacy concerns of performing an evaluation and management service by telephone and the availability of in person appointments. I also discussed with the patient that there may be a patient responsible charge related to this service. The patient expressed understanding and agreed to proceed.   History of Present Illness: Pt. Presents virtually  for follow up of abnormal LDCT. He is followed in the [place]. He is seen by Dr. Sinda Du in [place].  Pt.'s  LDCT was read as a Lung  RADS 3, nodules that are probably benign findings, short term follow up suggested: includes nodules with a low likelihood of becoming a clinically active cancer. Radiology recommends a 6 month repeat LDCT follow up.He states he had a COPD exacerbation right before his LDCT. He was treated at that time with a z pack and prednisone taper . He states he feels better. He feels his exacerbation is better and he feels he is back at his baseline    Observations/Objective: 12/29/2018 LDCT Lungs/Pleura: Centrilobular and paraseptal emphysema evident. Cylindrical bronchiectasis noted in the lung bases bilaterally, similar to prior with associated bronchial wall thickening and multiple areas of mucous plugging. Associated ill-defined micro nodularity in the lung bases is mildly progressed in the interval. These new nodular opacities measure in the 4-6 mm size range. No pleural effusion. IMPRESSION: Lung-RADS 3, probably benign findings. Short-term follow-up in 6 months is recommended with repeat low-dose chest CT without contrast (please use the following order, "CT CHEST  LCS NODULE FOLLOW-UP W/O CM"). Bronchiectasis with bronchial wall thickening and small airway impaction associated with peripheral micro nodularity having a tree-in-bud configuration in some regions. This is progressive in the interval and is likely related to atypical infection. Therapy prior to repeat screening CT recommended  Assessment and Plan: Flutter valve use 3-4  Puffs 2-3 times daily to help with mucus clearance Mucinex 1200 mg BID with a full glass of water  Sputum for culture, AFB and Fungal Follow up LDCT in 6 months ( 06/2019) Follow up 2 weeks before follow up LDCT in the office to assess need for treatment prior to follow up CT .  Ready to quit: No Counseling given: Yes    Follow Up Instructions: Follow up LDCT in 6 months Follow up OV 2 weeks prior to scan to determine need for treatment Ongoing tobacco abuse counseling   I discussed the assessment and treatment plan with the patient. The patient was provided an opportunity to ask questions and all were answered. The patient agreed with the plan and demonstrated/ verbalized  an understanding of the instructions.   The patient was advised to call back or seek an in-person evaluation if the symptoms worsen or if the condition fails to improve as anticipated.  I provided 25 minutes of non-face-to-face time during this encounter.   Magdalen Spatz, NP  01/11/2019 6:53 PM

## 2019-01-11 NOTE — Patient Instructions (Addendum)
It was good to talk with you today. I'm glad your COPD exacerbation has resolved with treatment We recommend using a flutter valve : Use  3-4  Puffs,  2-3 times daily to help with mucus clearance Take Mucinex 1200 mg twice daily  with a full glass of water to help thin and mobilize secretions  Sputum for culture, AFB and Fungal. Go to [facility] for sputum cups and directions on collection and return of specimen to the lab Follow up LDCT in 6 months ( 06/2019) Follow up 2 weeks before follow up LDCT in the office to ensure you are in good shape prior to scan Please contact office for sooner follow up if symptoms do not improve or worsen or seek emergency care .

## 2019-01-12 DIAGNOSIS — E785 Hyperlipidemia, unspecified: Secondary | ICD-10-CM | POA: Diagnosis not present

## 2019-01-12 DIAGNOSIS — N289 Disorder of kidney and ureter, unspecified: Secondary | ICD-10-CM | POA: Diagnosis not present

## 2019-01-12 DIAGNOSIS — Z0000 Encounter for general adult medical examination without abnormal findings: Secondary | ICD-10-CM | POA: Diagnosis not present

## 2019-01-12 DIAGNOSIS — Z125 Encounter for screening for malignant neoplasm of prostate: Secondary | ICD-10-CM | POA: Diagnosis not present

## 2019-01-13 LAB — CBC WITH DIFFERENTIAL/PLATELET
Basophils Absolute: 0.1 10*3/uL (ref 0.0–0.2)
Basos: 1 %
EOS (ABSOLUTE): 1.1 10*3/uL — ABNORMAL HIGH (ref 0.0–0.4)
Eos: 11 %
Hematocrit: 42.1 % (ref 37.5–51.0)
Hemoglobin: 14.1 g/dL (ref 13.0–17.7)
Immature Grans (Abs): 0 10*3/uL (ref 0.0–0.1)
Immature Granulocytes: 0 %
Lymphocytes Absolute: 1.7 10*3/uL (ref 0.7–3.1)
Lymphs: 17 %
MCH: 31.9 pg (ref 26.6–33.0)
MCHC: 33.5 g/dL (ref 31.5–35.7)
MCV: 95 fL (ref 79–97)
Monocytes Absolute: 1 10*3/uL — ABNORMAL HIGH (ref 0.1–0.9)
Monocytes: 10 %
Neutrophils Absolute: 6.1 10*3/uL (ref 1.4–7.0)
Neutrophils: 61 %
Platelets: 384 10*3/uL (ref 150–450)
RBC: 4.42 x10E6/uL (ref 4.14–5.80)
RDW: 12.1 % (ref 11.6–15.4)
WBC: 10 10*3/uL (ref 3.4–10.8)

## 2019-01-13 LAB — BASIC METABOLIC PANEL
BUN/Creatinine Ratio: 13 (ref 10–24)
BUN: 22 mg/dL (ref 8–27)
CO2: 23 mmol/L (ref 20–29)
Calcium: 9.9 mg/dL (ref 8.6–10.2)
Chloride: 102 mmol/L (ref 96–106)
Creatinine, Ser: 1.64 mg/dL — ABNORMAL HIGH (ref 0.76–1.27)
GFR calc Af Amer: 48 mL/min/{1.73_m2} — ABNORMAL LOW (ref >59)
GFR calc non Af Amer: 41 mL/min/{1.73_m2} — ABNORMAL LOW (ref >59)
Glucose: 90 mg/dL (ref 65–99)
Potassium: 5.3 mmol/L — ABNORMAL HIGH (ref 3.5–5.2)
Sodium: 140 mmol/L (ref 134–144)

## 2019-01-13 LAB — LIPID PANEL
Chol/HDL Ratio: 2.8 ratio (ref 0.0–5.0)
Cholesterol, Total: 140 mg/dL (ref 100–199)
HDL: 50 mg/dL (ref >39)
LDL Chol Calc (NIH): 74 mg/dL (ref 0–99)
Triglycerides: 80 mg/dL (ref 0–149)
VLDL Cholesterol Cal: 16 mg/dL (ref 5–40)

## 2019-01-13 LAB — PSA: Prostate Specific Ag, Serum: 2.7 ng/mL (ref 0.0–4.0)

## 2019-02-06 ENCOUNTER — Ambulatory Visit (INDEPENDENT_AMBULATORY_CARE_PROVIDER_SITE_OTHER): Payer: PPO | Admitting: Cardiovascular Disease

## 2019-02-06 ENCOUNTER — Encounter: Payer: Self-pay | Admitting: Cardiovascular Disease

## 2019-02-06 ENCOUNTER — Other Ambulatory Visit: Payer: Self-pay

## 2019-02-06 VITALS — BP 135/61 | HR 92 | Ht 67.0 in | Wt 190.0 lb

## 2019-02-06 DIAGNOSIS — I471 Supraventricular tachycardia: Secondary | ICD-10-CM

## 2019-02-06 DIAGNOSIS — I714 Abdominal aortic aneurysm, without rupture, unspecified: Secondary | ICD-10-CM

## 2019-02-06 DIAGNOSIS — Z95828 Presence of other vascular implants and grafts: Secondary | ICD-10-CM | POA: Diagnosis not present

## 2019-02-06 DIAGNOSIS — I10 Essential (primary) hypertension: Secondary | ICD-10-CM

## 2019-02-06 DIAGNOSIS — J449 Chronic obstructive pulmonary disease, unspecified: Secondary | ICD-10-CM

## 2019-02-06 NOTE — Patient Instructions (Signed)
Medication Instructions:  Your physician recommends that you continue on your current medications as directed. Please refer to the Current Medication list given to you today.  *If you need a refill on your cardiac medications before your next appointment, please call your pharmacy*  Lab Work: None today If you have labs (blood work) drawn today and your tests are completely normal, you will receive your results only by: . MyChart Message (if you have MyChart) OR . A paper copy in the mail If you have any lab test that is abnormal or we need to change your treatment, we will call you to review the results.  Testing/Procedures: None today  Follow-Up: At [facility], you and your health needs are our priority.  As part of our continuing mission to provide you with exceptional heart care, we have created designated Provider Care Teams.  These Care Teams include your primary Cardiologist (physician) and Advanced Practice Providers (APPs -  Physician Assistants and Nurse Practitioners) who all work together to provide you with the care you need, when you need it.  Your next appointment:   12 months  The format for your next appointment:   In Person  Provider:   Suresh Koneswaran, MD  Other Instructions None      Thank you for choosing [facility] !         

## 2019-02-06 NOTE — Progress Notes (Signed)
Virtual Visit via Telephone Note   This visit type was conducted due to national recommendations for restrictions regarding the COVID-19 Pandemic (e.g. social distancing) in an effort to limit this patient's exposure and mitigate transmission in our community.  Due to his co-morbid illnesses, this patient is at least at moderate risk for complications without adequate follow up.  This format is felt to be most appropriate for this patient at this time.  The patient did not have access to video technology/had technical difficulties with video requiring transitioning to audio format only (telephone).  All issues noted in this document were discussed and addressed.  No physical exam could be performed with this format.  Please refer to the patient's chart for his  consent to telehealth for [facility].   Date:  02/06/2019   ID:  Clydene Fake, DOB Jan 22, 1946, MRN [id number]  Patient Location: Home Provider Location: Office  PCP:  Kathyrn Drown, MD  Cardiologist:  Kate Sable, MD  Electrophysiologist:  None   Evaluation Performed:  Follow-Up Visit  Chief Complaint:  PSVT  History of Present Illness:    Blake Cherry is a 73 y.o. male with paroxysmal SVT, COPD, hypertension, abdominal aortic aneurysm status post repair in 2014, and prior CVA.  He denies chest pain.  Chronic exertional dyspnea is stable.  He has only had one episode of palpitations which resolved when taking his Cardizem.  He and his wife have been staying at home and being safe.  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   Past Medical History:  Diagnosis Date  . AAA (abdominal aortic aneurysm) ([place])   . Carotid artery occlusion 2007   Left cea  . COPD (chronic obstructive pulmonary disease) ([place])   .  Diverticulitis    last time 4 years ago  . Hypertension    per Dr Harrington Challenger  . Shortness of breath    with exertion  . Stroke [facility]) 05/2005    2 in 2007.  Weakness and numbness in left arm and leg  . SVT (supraventricular tachycardia) (HCC)    Past Surgical History:  Procedure Laterality Date  . ABDOMINAL AORTAGRAM N/A 03/10/2013   Procedure: ABDOMINAL Maxcine Ham;  Surgeon: Elam Dutch, MD;  Location: [facility] CATH LAB;  Service: Cardiovascular;  Laterality: N/A;  . ABDOMINAL AORTIC ENDOVASCULAR STENT GRAFT N/A 08/22/2012   Procedure: ABDOMINAL AORTIC ENDOVASCULAR STENT GRAFT;  Surgeon: Elam Dutch, MD;  Location: [place];  Service: Vascular;  Laterality: N/A;  . CAROTID ENDARTERECTOMY Left 2007  . CAROTID STENT  2007   left internal carotid artery  . TESTICLE TORSION REDUCTION     age of 70     No outpatient medications have been marked as taking for the 02/06/19 encounter (Office Visit) with Herminio Commons, MD.     Allergies:   Patient has no known allergies.   Social History   Tobacco Use  . Smoking status: Current Every Day Smoker    Packs/day: 1.00    Years: 50.00    Pack years: 50.00    Types: Cigarettes    Last attempt to quit: 10/01/2017    Years since quitting: 1.3  . Smokeless tobacco: Former Systems developer    Types: Snuff  Substance Use Topics  . Alcohol use: No  Alcohol/week: 0.0 standard drinks  . Drug use: No     Family Hx: The patient's family history includes Cancer in his mother; Diabetes in his father and mother; Hypertension in his mother.  ROS:   Please see the history of present illness.     All other systems reviewed and are negative.   Prior CV studies:   The following studies were reviewed today:  Echocardiogram: 08/2014 Study Conclusions  - Left ventricle: The cavity size was normal. Wall thickness was increased in a pattern of mild LVH. Systolic function was normal. The estimated ejection fraction was in the range of 55% to  60%. Indeterminant diastolic dysfunction. Wall motion was normal; there were no regional wall motion abnormalities. - Aortic valve: Mildly calcified annulus. Mildly thickened leaflets. Valve area (VTI): 3.31 cm^2. Valve area (Vmax): 3.16 cm^2. - Mitral valve: Mildly calcified annulus. Mildly thickened leaflets . - Atrial septum: There was increased thickness of the septum, consistent with lipomatous hypertrophy. No defect or patent foramen ovale was identified. - Technically difficult study.  NST: 10/2017  There was no ST segment deviation noted during stress.  The study is normal. There are no perfusion defects consistent with prior infarct or current ischemia.  This is a low risk study.  The left ventricular ejection fraction is hyperdynamic (>65%).  Labs/Other Tests and Data Reviewed:    EKG:  No ECG reviewed.  Recent Labs: 04/29/2018: B Natriuretic Peptide 779.0; Magnesium 2.0 01/12/2019: BUN 22; Creatinine, Ser 1.64; Hemoglobin 14.1; Platelets 384; Potassium 5.3; Sodium 140   Recent Lipid Panel Lab Results  Component Value Date/Time   CHOL 140 01/12/2019 08:59 AM   TRIG 80 01/12/2019 08:59 AM   HDL 50 01/12/2019 08:59 AM   CHOLHDL 2.8 01/12/2019 08:59 AM   CHOLHDL 3.6 10/27/2013 07:31 AM   LDLCALC 74 01/12/2019 08:59 AM    Wt Readings from Last 3 Encounters:  02/06/19 190 lb (86.2 kg)  11/07/18 192 lb 3.2 oz (87.2 kg)  07/21/18 195 lb (88.5 kg)     Objective:    Vital Signs:  BP 135/61   Pulse 92   Ht 5\' 7"  (1.702 m)   Wt 190 lb (86.2 kg)   BMI 29.76 kg/m    VITAL SIGNS:  reviewed  ASSESSMENT & PLAN:    1.  Paroxysmal SVT: Symptomatically stable on Cardizem CD.  Continue 240 mg daily.  2.  Hypertension: Controlled on present therapy.  No changes.  3.  Abdominal aortic aneurysm: Status post repair in 2014.  Followed by vascular surgery.  4.  COPD: Symptomatically stable.    COVID-19 Education: The signs and symptoms of COVID-19  were discussed with the patient and how to seek care for testing (follow up with PCP or arrange E-visit).  The importance of social distancing was discussed today.  Time:   Today, I have spent 10 minutes with the patient with telehealth technology discussing the above problems.     Medication Adjustments/Labs and Tests Ordered: Current medicines are reviewed at length with the patient today.  Concerns regarding medicines are outlined above.   Tests Ordered: No orders of the defined types were placed in this encounter.   Medication Changes: No orders of the defined types were placed in this encounter.   Follow Up:  Either In Person or Virtual in 1 year(s)  Signed, 2015, MD  02/06/2019 2:09 PM    [facility]

## 2019-02-10 DIAGNOSIS — H353131 Nonexudative age-related macular degeneration, bilateral, early dry stage: Secondary | ICD-10-CM | POA: Diagnosis not present

## 2019-02-10 DIAGNOSIS — Z961 Presence of intraocular lens: Secondary | ICD-10-CM | POA: Diagnosis not present

## 2019-04-04 NOTE — Telephone Encounter (Signed)
Patient is due for 6 month follow up CT May 5,2021. I will schedule CT closer to that time and then schedule his appt with Sarah 2 weeks prior

## 2019-04-16 ENCOUNTER — Ambulatory Visit: Payer: PPO | Attending: Internal Medicine

## 2019-04-16 DIAGNOSIS — Z23 Encounter for immunization: Secondary | ICD-10-CM

## 2019-04-16 NOTE — Progress Notes (Signed)
   Covid-19 Vaccination Clinic  Name:  Blake Cherry    MRN: [id number] DOB: 1945-11-19  04/16/2019  Blake Cherry was observed post Covid-19 immunization for 15 minutes without incidence. He was provided with Vaccine Information Sheet and instruction to access the V-Safe system.   Blake Cherry was instructed to call 911 with any severe reactions post vaccine: Marland Kitchen Difficulty breathing  . Swelling of your face and throat  . A fast heartbeat  . A bad rash all over your body  . Dizziness and weakness    Immunizations Administered    Name Date Dose VIS Date Route   Pfizer COVID-19 Vaccine 04/16/2019  3:12 PM 0.3 mL 02/03/2019 Intramuscular   Manufacturer: [employer], [employer]   Lot: [id number]   NDC: [address]-5

## 2019-05-10 ENCOUNTER — Ambulatory Visit: Payer: PPO | Attending: Internal Medicine

## 2019-05-10 DIAGNOSIS — Z23 Encounter for immunization: Secondary | ICD-10-CM

## 2019-05-10 NOTE — Progress Notes (Signed)
   Covid-19 Vaccination Clinic  Name:  Blake Cherry    MRN: [id number] DOB: 1945-07-29  05/10/2019  [name] was observed post Covid-19 immunization for 15 minutes without incident. He was provided with Vaccine Information Sheet and instruction to access the V-Safe system.   [name] was instructed to call 911 with any severe reactions post vaccine: Marland Kitchen Difficulty breathing  . Swelling of face and throat  . A fast heartbeat  . A bad rash all over body  . Dizziness and weakness   Immunizations Administered    Name Date Dose VIS Date Route   Pfizer COVID-19 Vaccine 05/10/2019 12:40 PM 0.3 mL 02/03/2019 Intramuscular   Manufacturer: [employer], [employer]   Lot: [id number]   NDC: [address]-1

## 2019-05-18 ENCOUNTER — Other Ambulatory Visit: Payer: Self-pay | Admitting: *Deleted

## 2019-05-18 DIAGNOSIS — Z125 Encounter for screening for malignant neoplasm of prostate: Secondary | ICD-10-CM

## 2019-06-02 ENCOUNTER — Other Ambulatory Visit: Payer: Self-pay | Admitting: Family Medicine

## 2019-06-13 NOTE — Progress Notes (Signed)
Virtual Visit via Video Note  I connected with Blake Cherry on 06/13/19 at  3:30 PM EDT by a video enabled telemedicine application and verified that I am speaking with the correct person using two identifiers.  Location: Patient: At Home  Provider:Working remotely from home*   I discussed the limitations of evaluation and management by telemedicine and the availability of in person appointments. The patient expressed understanding and agreed to proceed.  History of Present Illness: Pt. Presents for evaluation prior to his follow up low dose CT due May 6. He had a LDCT  12/2018 which was read as a Lung  RADS 3, nodules that are probably benign findings, short term follow up suggested: includes nodules with a low likelihood of becoming a clinically active cancer. Radiology recommends a 6 month repeat LDCT follow up.The scan was notable for  Bronchiectasis with bronchial wall thickening and small airway impaction associated with peripheral micro nodularity having a tree-in-bud configuration in some regions. This was  progressive in the interval and is likely related to atypical infection.   Radiology recommended treatment prior to follow-up CT scan to allow for optimal visualization of patient airways and lung tissue.  Patient is in agreement with this.  I have electronically sent a prescription for doxycycline 100 mg twice daily for 7 days to be taken prior to his follow-up screening scan due Jun 29, 2019.  Patient verbalized understanding of the above.  He states he has taken doxycycline many times before with no adverse drug reaction.  Patient states he is doing well, he is compliant with his Symbicort, and Pulmicort nebs. Patient states he has adequate supply of maintenance medication.   Observations/Objective: 12/2018 LDCT Lung-RADS 3, probably benign findings. Short-term follow-up in 6 months is recommended with repeat low-dose chest CT without contrast (please use the following order,  "CT CHEST LCS NODULE FOLLOW-UP W/O CM"). Bronchiectasis with bronchial wall thickening and small airway impaction associated with peripheral micro nodularity having a tree-in-bud configuration in some regions. This is progressive in the interval and is likely related to atypical infection. Therapy prior to repeat screening CT recommended. 2.  Emphysema. (ICD10-J43.9) 3.  Aortic Atherosclerois (ICD10-170.0)   Assessment and Plan: Bronchiectasis Plan to pretreat with doxycycline prior to Jun 29, 2019 follow-up low-dose CT Plan Take doxycycline 100 mg twice daily x7 days prior to follow-up low-dose CT scheduled for 06/29/2019 Continue Symbicort as you have been doing Continue Pulmicort nebs as you have been doing Rinse mouth after use Continue Mucinex 600 mg every 12 hours as you have been doing Follow-up low-dose CT Jun 29, 2019 We will call you with the results of your low-dose CT. We will make sure we share the results with your primary care Dr. Sallee Lange Follow-up based on low-dose CT lung RADS results Note your daily symptoms > remember "red flags" for COPD:  Increase in cough, increase in sputum production, increase in shortness of breath or activity intolerance. If you notice these symptoms, please call to be seen.    Follow Up Instructions: Follow-up based on low-dose CT lung RADS results or as needed   I discussed the assessment and treatment plan with the patient. The patient was provided an opportunity to ask questions and all were answered. The patient agreed with the plan and demonstrated an understanding of the instructions.   The patient was advised to call back or seek an in-person evaluation if the symptoms worsen or if the condition fails to improve as anticipated.  I  provided 23 minutes of non-face-to-face time during this encounter.   Bevelyn Ngo, NP 06/14/2019

## 2019-06-14 ENCOUNTER — Ambulatory Visit (INDEPENDENT_AMBULATORY_CARE_PROVIDER_SITE_OTHER): Payer: PPO | Admitting: Acute Care

## 2019-06-14 ENCOUNTER — Other Ambulatory Visit: Payer: Self-pay

## 2019-06-14 ENCOUNTER — Telehealth: Payer: Self-pay | Admitting: Acute Care

## 2019-06-14 ENCOUNTER — Encounter: Payer: Self-pay | Admitting: Acute Care

## 2019-06-14 DIAGNOSIS — Z122 Encounter for screening for malignant neoplasm of respiratory organs: Secondary | ICD-10-CM

## 2019-06-14 DIAGNOSIS — J479 Bronchiectasis, uncomplicated: Secondary | ICD-10-CM

## 2019-06-14 DIAGNOSIS — Z7951 Long term (current) use of inhaled steroids: Secondary | ICD-10-CM

## 2019-06-14 DIAGNOSIS — F17210 Nicotine dependence, cigarettes, uncomplicated: Secondary | ICD-10-CM

## 2019-06-14 DIAGNOSIS — J441 Chronic obstructive pulmonary disease with (acute) exacerbation: Secondary | ICD-10-CM

## 2019-06-14 MED ORDER — DOXYCYCLINE HYCLATE 100 MG PO TABS: 100.0000 mg | ORAL_TABLET | Freq: Two times a day (BID) | ORAL | 0 refills | Status: AC

## 2019-06-14 NOTE — Telephone Encounter (Signed)
I have called the patient at the scheduled televisit time. There was no answer. I have left the patient a message requesting that he call the office to schedule a televisit with me prior to going for his next low-dose CT which is currently scheduled for May 6. Triage, please reach out to patient and schedule for a televisit with me next week if possible. Thank you so much.

## 2019-06-14 NOTE — Patient Instructions (Addendum)
It was great to talk with you today Take doxycycline 100 mg twice daily x7 days prior to follow-up low-dose CT scheduled for 06/29/2019 Continue Symbicort as you have been doing Continue Pulmicort nebs as you have been doing Rinse mouth after use Continue Mucinex 600 mg every 12 hours as you have been doing Follow-up low-dose CT Jun 29, 2019 We will call you with the results of your low-dose CT. We will make sure we share the results with your primary care Dr. Lilyan Punt Follow-up based on low-dose CT lung RADS results, or as needed Note your daily symptoms > remember "red flags" for COPD:  Increase in cough, increase in sputum production, increase in shortness of breath or activity intolerance. If you notice these symptoms, please call to be seen.   Please contact office for sooner follow up if symptoms do not improve or worsen or seek emergency care

## 2019-06-19 ENCOUNTER — Other Ambulatory Visit: Payer: Self-pay | Admitting: Family Medicine

## 2019-06-19 NOTE — Telephone Encounter (Signed)
H6 refills each

## 2019-06-29 ENCOUNTER — Other Ambulatory Visit: Payer: Self-pay

## 2019-06-29 ENCOUNTER — Ambulatory Visit (HOSPITAL_COMMUNITY)
Admission: RE | Admit: 2019-06-29 | Discharge: 2019-06-29 | Disposition: A | Discharge status: Home / Self Care | Payer: PPO | Source: Ambulatory Visit | Attending: Acute Care | Admitting: Acute Care

## 2019-06-29 DIAGNOSIS — Z122 Encounter for screening for malignant neoplasm of respiratory organs: Secondary | ICD-10-CM | POA: Insufficient documentation

## 2019-06-29 DIAGNOSIS — F17210 Nicotine dependence, cigarettes, uncomplicated: Secondary | ICD-10-CM | POA: Diagnosis not present

## 2019-06-29 DIAGNOSIS — R911 Solitary pulmonary nodule: Secondary | ICD-10-CM | POA: Diagnosis not present

## 2019-06-29 DIAGNOSIS — Z87891 Personal history of nicotine dependence: Secondary | ICD-10-CM | POA: Diagnosis not present

## 2019-07-03 ENCOUNTER — Other Ambulatory Visit: Payer: Self-pay | Admitting: Student

## 2019-07-04 NOTE — Progress Notes (Signed)
Blake Cherry, I have called the patient with the results. He verbalized understanding and understands we will do a 6 month follow up scan. Please place order for follow up scan and fax results to PCP. Thanks so much

## 2019-07-04 NOTE — Progress Notes (Signed)
Please call patient and let them  know their  low dose Ct was read as a Lung  RADS 3, nodules that are probably benign findings, short term follow up suggested: includes nodules with a low likelihood of becoming a clinically active cancer. Radiology recommends a 6 month repeat LDCT follow up. .Please let them  know we will order and schedule their  annual screening scan for 12/2019. Please let them  know there was notation of CAD on their  scan.  Please remind the patient  that this is a non-gated exam therefore degree or severity of disease  cannot be determined. Please have them  follow up with their PCP regarding potential risk factor modification, dietary therapy or pharmacologic therapy if clinically indicated. Pt.  is not currently  currently on statin therapy. Please place order for annual  screening scan for  12/2019 and fax results to PCP. Thanks so much.

## 2019-07-04 NOTE — Telephone Encounter (Signed)
This is a [facility] pt.  °

## 2019-07-06 ENCOUNTER — Other Ambulatory Visit: Payer: Self-pay | Admitting: *Deleted

## 2019-07-06 DIAGNOSIS — Z87891 Personal history of nicotine dependence: Secondary | ICD-10-CM

## 2019-07-06 DIAGNOSIS — F17210 Nicotine dependence, cigarettes, uncomplicated: Secondary | ICD-10-CM

## 2019-08-02 ENCOUNTER — Other Ambulatory Visit: Payer: Self-pay | Admitting: Family Medicine

## 2019-08-02 NOTE — Telephone Encounter (Signed)
Scheduled 8/3

## 2019-09-26 ENCOUNTER — Ambulatory Visit (INDEPENDENT_AMBULATORY_CARE_PROVIDER_SITE_OTHER): Payer: PPO | Admitting: Family Medicine

## 2019-09-26 ENCOUNTER — Encounter: Payer: Self-pay | Admitting: Family Medicine

## 2019-09-26 ENCOUNTER — Other Ambulatory Visit: Payer: Self-pay

## 2019-09-26 VITALS — BP 118/60 | HR 101 | Temp 97.9°F | Wt 175.0 lb

## 2019-09-26 DIAGNOSIS — J441 Chronic obstructive pulmonary disease with (acute) exacerbation: Secondary | ICD-10-CM

## 2019-09-26 DIAGNOSIS — I10 Essential (primary) hypertension: Secondary | ICD-10-CM

## 2019-09-26 DIAGNOSIS — N289 Disorder of kidney and ureter, unspecified: Secondary | ICD-10-CM | POA: Diagnosis not present

## 2019-09-26 DIAGNOSIS — Z125 Encounter for screening for malignant neoplasm of prostate: Secondary | ICD-10-CM

## 2019-09-26 DIAGNOSIS — D692 Other nonthrombocytopenic purpura: Secondary | ICD-10-CM | POA: Diagnosis not present

## 2019-09-26 MED ORDER — LISINOPRIL 2.5 MG PO TABS: 2.5000 mg | ORAL_TABLET | Freq: Every day | ORAL | 1 refills | Status: DC

## 2019-09-26 MED ORDER — AMOXICILLIN-POT CLAVULANATE 875-125 MG PO TABS
1.0000 | ORAL_TABLET | Freq: Two times a day (BID) | ORAL | 0 refills | Status: AC
Start: 2019-09-26 — End: 2019-10-10

## 2019-09-26 MED ORDER — PREDNISONE 20 MG PO TABS
ORAL_TABLET | ORAL | 0 refills | Status: DC
Start: 2019-09-26 — End: 2020-02-08

## 2019-09-26 MED ORDER — BUDESONIDE-FORMOTEROL FUMARATE 160-4.5 MCG/ACT IN AERO: 2.0000 | INHALATION_SPRAY | Freq: Two times a day (BID) | RESPIRATORY_TRACT | 6 refills | Status: DC

## 2019-09-26 MED ORDER — ALBUTEROL SULFATE (2.5 MG/3ML) 0.083% IN NEBU: INHALATION_SOLUTION | RESPIRATORY_TRACT | 5 refills | Status: DC

## 2019-09-26 MED ORDER — PROAIR HFA 108 (90 BASE) MCG/ACT IN AERS: INHALATION_SPRAY | RESPIRATORY_TRACT | 5 refills | Status: DC

## 2019-09-26 MED ORDER — MOMETASONE FUROATE 0.1 % EX CREA: TOPICAL_CREAM | CUTANEOUS | 1 refills | Status: AC

## 2019-09-26 NOTE — Progress Notes (Signed)
   Subjective:    Patient ID: Blake Cherry, male    DOB: 1946/02/21, 74 y.o.   MRN: [id number]  COPD Primary symptoms comments: Patient has experienced worsening shortness of breath and coughing up a black substance for the past few weeks. . This is a chronic problem. His past medical history is significant for COPD.  Hypertension This is a chronic problem. Treatments tried: Lisinopril. There are no compliance problems.   Patient with mild exacerbation of COPD.  Denies any high fever chills sweats Relates coughing up a lot of discolored phlegm also relates a lot of shortness of breath with activity O2 saturation 94% not respiratory distress currently    Review of Systems Please see above denies hemoptysis denies night sweats fevers    Objective:   Physical Exam Bilateral expiratory wheezes not severe heart regular extremities no edema skin warm dry  Ecchymosis noted on the arms     Assessment & Plan:  1. COPD exacerbation (HCC) COPD exacerbation very important to go ahead with prednisone over her 12-day span plus antibiotics over 2 weeks plan follow-up if progressive troubles  2. Renal insufficiency Renal insufficiency will encourage patient to continue watching diet closely.  Avoid excessive proteins.  Previous kidney function stable - CBC with Differential/Platelet  3. Purpura senilis (HCC) We will check CBC await the results  4. Screening PSA (prostate specific antigen) PSA ordered it did go up moderately on the last visit so therefore we need to see if it is going up too fast - PSA  5. Essential hypertension, benign Blood pressure good control continue current measures - CBC with Differential/Platelet

## 2019-09-27 LAB — CBC WITH DIFFERENTIAL/PLATELET
Basophils Absolute: 0.1 10*3/uL (ref 0.0–0.2)
Basos: 1 %
EOS (ABSOLUTE): 0.8 10*3/uL — ABNORMAL HIGH (ref 0.0–0.4)
Eos: 7 %
Hematocrit: 41.8 % (ref 37.5–51.0)
Hemoglobin: 13.8 g/dL (ref 13.0–17.7)
Immature Grans (Abs): 0 10*3/uL (ref 0.0–0.1)
Immature Granulocytes: 0 %
Lymphocytes Absolute: 1.3 10*3/uL (ref 0.7–3.1)
Lymphs: 12 %
MCH: 32 pg (ref 26.6–33.0)
MCHC: 33 g/dL (ref 31.5–35.7)
MCV: 97 fL (ref 79–97)
Monocytes Absolute: 1 10*3/uL — ABNORMAL HIGH (ref 0.1–0.9)
Monocytes: 9 %
Neutrophils Absolute: 7.7 10*3/uL — ABNORMAL HIGH (ref 1.4–7.0)
Neutrophils: 71 %
Platelets: 338 10*3/uL (ref 150–450)
RBC: 4.31 x10E6/uL (ref 4.14–5.80)
RDW: 12 % (ref 11.6–15.4)
WBC: 11 10*3/uL — ABNORMAL HIGH (ref 3.4–10.8)

## 2019-09-27 LAB — PSA: Prostate Specific Ag, Serum: 1.8 ng/mL (ref 0.0–4.0)

## 2019-09-28 ENCOUNTER — Telehealth: Payer: Self-pay | Admitting: Family Medicine

## 2019-09-28 NOTE — Telephone Encounter (Signed)
Form placed in dr scott's folder

## 2019-09-28 NOTE — Telephone Encounter (Signed)
Renewal for disability parking placard form placed in nurse box at nurse station.

## 2019-09-29 NOTE — Telephone Encounter (Signed)
Form was completed and please make sure medical license number is placed on the form

## 2019-09-29 NOTE — Telephone Encounter (Signed)
Patient requested that it be mailed to him ,went out today 8/6

## 2019-11-02 ENCOUNTER — Other Ambulatory Visit: Payer: Self-pay | Admitting: Family Medicine

## 2019-11-04 ENCOUNTER — Other Ambulatory Visit: Payer: Self-pay | Admitting: Family Medicine

## 2019-11-12 ENCOUNTER — Other Ambulatory Visit: Payer: Self-pay | Admitting: Family Medicine

## 2019-11-14 ENCOUNTER — Other Ambulatory Visit: Payer: Self-pay

## 2019-11-14 ENCOUNTER — Ambulatory Visit: Payer: PPO | Admitting: *Deleted

## 2019-11-14 DIAGNOSIS — Z23 Encounter for immunization: Secondary | ICD-10-CM

## 2019-11-30 ENCOUNTER — Ambulatory Visit: Payer: PPO | Attending: Internal Medicine

## 2019-11-30 DIAGNOSIS — Z23 Encounter for immunization: Secondary | ICD-10-CM

## 2019-11-30 NOTE — Progress Notes (Signed)
   Covid-19 Vaccination Clinic  Name:  Blake Cherry    MRN: [id number] DOB: 09-22-1945  11/30/2019  Blake Cherry was observed post Covid-19 immunization for 15 minutes without incident. He was provided with Vaccine Information Sheet and instruction to access the V-Safe system.   Blake Cherry was instructed to call 911 with any severe reactions post vaccine: Marland Kitchen Difficulty breathing  . Swelling of face and throat  . A fast heartbeat  . A bad rash all over body  . Dizziness and weakness

## 2020-01-08 ENCOUNTER — Other Ambulatory Visit: Payer: Self-pay | Admitting: Family Medicine

## 2020-01-16 DIAGNOSIS — R059 Cough, unspecified: Secondary | ICD-10-CM | POA: Diagnosis not present

## 2020-01-19 ENCOUNTER — Other Ambulatory Visit: Payer: Self-pay | Admitting: Family Medicine

## 2020-02-06 ENCOUNTER — Other Ambulatory Visit: Payer: Self-pay | Admitting: Family Medicine

## 2020-02-07 ENCOUNTER — Other Ambulatory Visit: Payer: Self-pay | Admitting: *Deleted

## 2020-02-07 MED ORDER — ALBUTEROL SULFATE (2.5 MG/3ML) 0.083% IN NEBU: INHALATION_SOLUTION | RESPIRATORY_TRACT | 5 refills | Status: AC

## 2020-02-08 ENCOUNTER — Encounter: Payer: Self-pay | Admitting: Physician Assistant

## 2020-02-08 ENCOUNTER — Other Ambulatory Visit: Payer: Self-pay

## 2020-02-08 ENCOUNTER — Other Ambulatory Visit (HOSPITAL_COMMUNITY)
Admission: RE | Admit: 2020-02-08 | Discharge: 2020-02-08 | Disposition: A | Discharge status: Home / Self Care | Payer: PPO | Source: Ambulatory Visit | Attending: Physician Assistant | Admitting: Physician Assistant

## 2020-02-08 ENCOUNTER — Ambulatory Visit: Payer: PPO | Admitting: Physician Assistant

## 2020-02-08 VITALS — BP 118/60 | HR 122 | Ht 67.0 in | Wt 170.8 lb

## 2020-02-08 DIAGNOSIS — I4892 Unspecified atrial flutter: Secondary | ICD-10-CM

## 2020-02-08 DIAGNOSIS — Z79899 Other long term (current) drug therapy: Secondary | ICD-10-CM | POA: Insufficient documentation

## 2020-02-08 DIAGNOSIS — E875 Hyperkalemia: Secondary | ICD-10-CM | POA: Diagnosis not present

## 2020-02-08 DIAGNOSIS — J449 Chronic obstructive pulmonary disease, unspecified: Secondary | ICD-10-CM | POA: Insufficient documentation

## 2020-02-08 DIAGNOSIS — I10 Essential (primary) hypertension: Secondary | ICD-10-CM

## 2020-02-08 DIAGNOSIS — F17210 Nicotine dependence, cigarettes, uncomplicated: Secondary | ICD-10-CM | POA: Diagnosis not present

## 2020-02-08 DIAGNOSIS — I739 Peripheral vascular disease, unspecified: Secondary | ICD-10-CM | POA: Insufficient documentation

## 2020-02-08 DIAGNOSIS — I471 Supraventricular tachycardia, unspecified: Secondary | ICD-10-CM

## 2020-02-08 DIAGNOSIS — N1832 Chronic kidney disease, stage 3b: Secondary | ICD-10-CM | POA: Insufficient documentation

## 2020-02-08 DIAGNOSIS — I129 Hypertensive chronic kidney disease with stage 1 through stage 4 chronic kidney disease, or unspecified chronic kidney disease: Secondary | ICD-10-CM | POA: Insufficient documentation

## 2020-02-08 LAB — COMPREHENSIVE METABOLIC PANEL
ALT: 10 U/L (ref 0–44)
AST: 17 U/L (ref 15–41)
Albumin: 3.9 g/dL (ref 3.5–5.0)
Alkaline Phosphatase: 74 U/L (ref 38–126)
Anion gap: 8 (ref 5–15)
BUN: 29 mg/dL — ABNORMAL HIGH (ref 8–23)
CO2: 25 mmol/L (ref 22–32)
Calcium: 9.2 mg/dL (ref 8.9–10.3)
Chloride: 105 mmol/L (ref 98–111)
Creatinine, Ser: 1.63 mg/dL — ABNORMAL HIGH (ref 0.61–1.24)
GFR, Estimated: 44 mL/min — ABNORMAL LOW (ref >60)
Glucose, Bld: 81 mg/dL (ref 70–99)
Potassium: 5.7 mmol/L — ABNORMAL HIGH (ref 3.5–5.1)
Sodium: 138 mmol/L (ref 135–145)
Total Bilirubin: 0.7 mg/dL (ref 0.3–1.2)
Total Protein: 7.1 g/dL (ref 6.5–8.1)

## 2020-02-08 LAB — CBC
HCT: 45.6 % (ref 39.0–52.0)
Hemoglobin: 14.1 g/dL (ref 13.0–17.0)
MCH: 31.9 pg (ref 26.0–34.0)
MCHC: 30.9 g/dL (ref 30.0–36.0)
MCV: 103.2 fL — ABNORMAL HIGH (ref 80.0–100.0)
Platelets: 300 10*3/uL (ref 150–400)
RBC: 4.42 MIL/uL (ref 4.22–5.81)
RDW: 13.4 % (ref 11.5–15.5)
WBC: 8.7 10*3/uL (ref 4.0–10.5)
nRBC: 0 % (ref 0.0–0.2)

## 2020-02-08 LAB — LIPID PANEL
Cholesterol: 138 mg/dL (ref 0–200)
HDL: 48 mg/dL (ref >40)
LDL Cholesterol: 77 mg/dL (ref 0–99)
Total CHOL/HDL Ratio: 2.9 RATIO
Triglycerides: 64 mg/dL (ref <150)
VLDL: 13 mg/dL (ref 0–40)

## 2020-02-08 LAB — T4, FREE: Free T4: 0.82 ng/dL (ref 0.61–1.12)

## 2020-02-08 LAB — TSH: TSH: 3.4 u[IU]/mL (ref 0.350–4.500)

## 2020-02-08 MED ORDER — APIXABAN 5 MG PO TABS: 5.0000 mg | ORAL_TABLET | Freq: Two times a day (BID) | ORAL | 11 refills | Status: DC

## 2020-02-08 MED ORDER — DILTIAZEM HCL ER COATED BEADS 300 MG PO CP24
300.0000 mg | ORAL_CAPSULE | Freq: Every day | ORAL | 3 refills | Status: DC
Start: 2020-02-08 — End: 2020-04-04

## 2020-02-08 NOTE — Patient Instructions (Addendum)
Medication Instructions:  Your physician has recommended you make the following change in your medication:   Stop Taking Lisinopril  Increase Diltiazem to 300 mg Daily  Start Eliquis 5 mg Two Times Daily   *If you need a refill on your cardiac medications before your next appointment, please call your pharmacy*   Lab Work: Your physician recommends that you return for lab work in: Today   If you have labs (blood work) drawn today and your tests are completely normal, you will receive your results only by: Marland Kitchen MyChart Message (if you have MyChart) OR . A paper copy in the mail If you have any lab test that is abnormal or we need to change your treatment, we will call you to review the results.   Testing/Procedures: Your physician has requested that you have an echocardiogram. Echocardiography is a painless test that uses sound waves to create images of your heart. It provides your doctor with information about the size and shape of your heart and how well your heart's chambers and valves are working. This procedure takes approximately one hour. There are no restrictions for this procedure.   Follow-Up: At [facility], you and your health needs are our priority.  As part of our continuing mission to provide you with exceptional heart care, we have created designated Provider Care Teams.  These Care Teams include your primary Cardiologist (physician) and Advanced Practice Providers (APPs -  Physician Assistants and Nurse Practitioners) who all work together to provide you with the care you need, when you need it.  We recommend signing up for the patient portal called "MyChart".  Sign up information is provided on this After Visit Summary.  MyChart is used to connect with patients for Virtual Visits (Telemedicine).  Patients are able to view lab/test results, encounter notes, upcoming appointments, etc.  Non-urgent messages can be sent to your provider as well.   To learn more about what you  can do with MyChart, go to [email or web address].    Your next appointment:   3-4 week(s)  The format for your next appointment:   In Person  Provider:   You will see one of the following Advanced Practice Providers on your designated Care Team:    Randall An, PA-C   Jacolyn Reedy, PA-C    Other Instructions Thank you for choosing [facility]!    For now you will continue the baby aspirin with the Eliquis (apixaban) but we will reach out to your vascular team to help guide whether we can stop the aspirin.  If you notice any bleeding such as blood in stool, black tarry stools, blood in urine, nosebleeds or any other unusual bleeding, call your doctor immediately. It is not normal to have this kind of bleeding while on a blood thinner and usually indicates there is an underlying problem with one of your body systems that needs to be checked out.   Please reach out to the [facility] to arrange your overdue follow-up with them (last seen in 2018). [phone]  Please contact [facility] to arrange your repeat surveillance CT scan (they wanted this to be repeated in 12/2019 so it is overdue). [phone]

## 2020-02-08 NOTE — Progress Notes (Addendum)
Cardiology Office Note    Date:  02/08/2020   ID:  Blake Cherry, DOB 02/09/1946, MRN [id number]  PCP:  Babs SciaraLuking, Scott A, MD  Cardiologist:  Prentice DockerSuresh Koneswaran, MD (Inactive)  Electrophysiologist:  None   Chief Complaint: f/u SVT, HTN -> found to be in new onset atrial flutter today  History of Present Illness:   Blake Cherry is a 74 y.o. male with history of paroxysmal SVT, COPD, ongoing tobacco abuse, HTN, AAA s/p repair 2014 with repeat angioplasty 2015, carotid artery disease s/p L CEA, diverticulitis, prior CVA with chronic exertional dyspnea, CKD stage IIIb, hyperkalemia who presents for yearly follow-up. He previously followed with Dr. Purvis SheffieldKoneswaran for SVT. 2D echo 2016 showed EF 55-60%, increased thickness of atrial septum c/w lipomatous hypertrophy, mildly calcified AV/MV annulus otherwise no acute issues, technically difficult study. Vascular surgery follows his PAD, last OV there 2018 (overdue). He is also enrolled in the low dose CT cancer screening with last assessment 06/2019 with lung-RADS 3, "probably benign findings," short term f/u due 6 months (12/2019).  He is seen back for follow-up today found to be in new onset atrial flutter. HR was 122 on arrival after walking to the table, down to 95 while seated. (Prior HR OV last year 5092.) He states he thought something might be off as he noticed his HR was running somewhat higher the last 3 weeks when he checked it at home but he otherwise hadn't noticed any change in symptoms. He has chronic unchanged exertional dyspnea. No CP, palpitations, bleeding or falls.  Labwork independently reviewed: 09/2019 Hgb 13.9 12/2018 K 5.3, Cr 1.64, LDL 74  Past Medical History:  Diagnosis Date  . AAA (abdominal aortic aneurysm) (HCC)   . Carotid artery occlusion 2007   Left cea  . CKD (chronic kidney disease), stage III (HCC)   . COPD (chronic obstructive pulmonary disease) (HCC)   . Diverticulitis    last time 4 years ago  .  Hypertension   . Shortness of breath    with exertion  . Stroke [facility](HCC) 05/2005    2 in 2007.  Weakness and numbness in left arm and leg  . SVT (supraventricular tachycardia) (HCC)     Past Surgical History:  Procedure Laterality Date  . ABDOMINAL AORTAGRAM N/A 03/10/2013   Procedure: ABDOMINAL Ronny FlurryAORTAGRAM;  Surgeon: Sherren Kernsharles E Fields, MD;  [facility];  Service: Cardiovascular;  Laterality: N/A;  . ABDOMINAL AORTIC ENDOVASCULAR STENT GRAFT N/A 08/22/2012   Procedure: ABDOMINAL AORTIC ENDOVASCULAR STENT GRAFT;  Surgeon: Sherren Kernsharles E Fields, MD;  [facility];  Service: Vascular;  Laterality: N/A;  . CAROTID ENDARTERECTOMY Left 2007  . CAROTID STENT  2007   left internal carotid artery  . TESTICLE TORSION REDUCTION     age of 74    Current Medications: Current Meds  Medication Sig  . albuterol (PROVENTIL) (2.5 MG/3ML) 0.083% nebulizer solution USE 1 VIAL IN NEBULIZER EVERY 4 HOURS AS NEEDED FOR WHEEZING OR SHORTNESS OF BREATH  . aspirin EC 81 MG tablet Take 81 mg by mouth daily.  . budesonide (PULMICORT) 0.5 MG/2ML nebulizer solution USE 1 VIAL IN NEBULIZER TWICE DAILY  . budesonide-formoterol (SYMBICORT) 160-4.5 MCG/ACT inhaler Inhale 2 puffs by mouth twice daily  . diltiazem (CARDIZEM CD) 240 MG 24 hr capsule TAKE 1 CAPSULE(240 MG) BY MOUTH DAILY  . guaiFENesin (MUCINEX) 600 MG 12 hr tablet Take 1 tablet (600 mg total) by mouth 2 (two) times daily. (Patient taking differently: Take 600 mg  by mouth as needed.)  . lisinopril (ZESTRIL) 2.5 MG tablet Take 1 tablet (2.5 mg total) by mouth daily.  . mometasone (ELOCON) 0.1 % cream Apply BID PRN  . PROAIR HFA 108 (90 Base) MCG/ACT inhaler INHALE 2 PUFFS BY MOUTH EVERY 6 HOURS AS NEEDED FOR WHEEZING FOR SHORTNESS OF BREATH     Allergies:   Patient has no known allergies.   Social History   Socioeconomic History  . Marital status: Married    Spouse name: Not on file  . Number of children: Not on file  . Years of education: Not  on file  . Highest education level: Not on file  Occupational History  . Not on file  Tobacco Use  . Smoking status: Current Every Day Smoker    Packs/day: 0.25    Years: 50.00    Pack years: 12.50    Types: Cigarettes    Last attempt to quit: 10/01/2017    Years since quitting: 2.3  . Smokeless tobacco: Former Neurosurgeon    Types: Snuff  Vaping Use  . Vaping Use: Never used  Substance and Sexual Activity  . Alcohol use: No    Alcohol/week: 0.0 standard drinks  . Drug use: No  . Sexual activity: Not on file  Other Topics Concern  . Not on file  Social History Narrative  . Not on file   Social Determinants of Health   Financial Resource Strain: Not on file  Food Insecurity: Not on file  Transportation Needs: Not on file  Physical Activity: Not on file  Stress: Not on file  Social Connections: Not on file     Family History:  The patient's family history includes Cancer in his mother; Diabetes in his father and mother; Hypertension in his mother.  ROS:   Please see the history of present illness.  All other systems are reviewed and otherwise negative.    EKGs/Labs/Other Studies Reviewed:    Studies reviewed are outlined and summarized above. Reports included below if pertinent.  2D echo 2016  - Left ventricle: The cavity size was normal. Wall thickness was  increased in a pattern of mild LVH. Systolic function was normal.  The estimated ejection fraction was in the range of 55% to 60%.  Indeterminant diastolic dysfunction. Wall motion was normal;  there were no regional wall motion abnormalities.  - Aortic valve: Mildly calcified annulus. Mildly thickened  leaflets. Valve area (VTI): 3.31 cm^2. Valve area (Vmax): 3.16  cm^2.  - Mitral valve: Mildly calcified annulus. Mildly thickened leaflets  .  - Atrial septum: There was increased thickness of the septum,  consistent with lipomatous hypertrophy. No defect or patent  foramen ovale was identified.   - Technically difficult study.     EKG:  EKG is ordered today, personally reviewed, demonstrating: Both tracings reviewed and show atrial flutter with variable response, NSST changes (flutter waves best seen in lead III, also more clear on EKG machine itself)  Recent Labs: 09/26/2019: Hemoglobin 13.8; Platelets 338  Recent Lipid Panel    Component Value Date/Time   CHOL 140 01/12/2019 0859   TRIG 80 01/12/2019 0859   HDL 50 01/12/2019 0859   CHOLHDL 2.8 01/12/2019 0859   CHOLHDL 3.6 10/27/2013 0731   VLDL 24 10/27/2013 0731   LDLCALC 74 01/12/2019 0859    PHYSICAL EXAM:    VS:  BP 118/60   Pulse (!) 122   Ht 5\' 7"  (1.702 m)   Wt 170 lb 12.8 oz (77.5 kg)  SpO2 94%   BMI 26.75 kg/m   BMI: Body mass index is 26.75 kg/m.  GEN: Well nourished, well developed WM, in no acute distress HEENT: normocephalic, atraumatic Neck: no JVD, carotid bruits, or masses Cardiac: irregularly irregular, rate upper normal; no murmurs, rubs, or gallops, no edema  Respiratory:  Coarse BS bilaterally, scattered rare rhonchi, normal work of breathing GI: soft, nontender, nondistended, + BS MS: no deformity or atrophy Skin: warm and dry, no rash Neuro:  Alert and Oriented x 3, Strength and sensation are intact, follows commands Psych: euthymic mood, full affect  Wt Readings from Last 3 Encounters:  02/08/20 170 lb 12.8 oz (77.5 kg)  09/26/19 175 lb (79.4 kg)  02/06/19 190 lb (86.2 kg)     ASSESSMENT & PLAN:   1. New onset atrial flutter - reviewed EKG with DOD Dr. Diona Browner who agrees. He is fortunately relatively asymptomatic. Resting HR 95bpm by EKG, was 122 when he arrived to the table per MA vitals. Will increase diltiazem to 300mg  daily since he is actually not too far off his usual HR when in NSR. CHADSVASC 5 for HTN, age, prior stroke, vascular disease indicating need for anticoagulation due to risk of stroke in this rhythm. Discussed risks/benefits of anticoagulation with patient who is  agreeable. Will start Eliquis 5mg  BID (meets 1 of 3 criteria at this time - will need to re-eval at each visit given CKD). Bleeding precautions reviewed. Will check full labs and 2D echocardiogram. Will start with rate control strategy and bring back to clinic in a few weeks to reassess how he is doing. Further decision making regarding rate vs rhythm control can be decided at that time, supplemented by information from his echocardiogram.  2. PSVT - largely quiescent. Follow for recurrent symptoms.  3. CKD stage IIIb with hyperkalemia - recheck labs today.  4. PAD - he is overdue for f/u with vascular surgery, reminded him of need to do so and gave him their number. I tentatively discussed with Dr. about his aspirin dosing now that we are adding Eliquis - we will continue ASA for now given PAD hx but will reach out to Dr. for his input on whether we are OK to drop the aspirin. He also should be on a statin but is not. The patient unsure why. Will also get CMET/lipids today and consider adding at close visit. Would not add the same time of starting Eliquis just in case he has any allergic reactions/intolerances so that we more easily discern the cause. Addendum: heard back from Dr. Diona Browner who gave OK to stop ASA - we will let pt know when we relay lab results in result note.  5. Essential HTN - BP normal. He reports he sees similar readings at home. Since we are titrating diltiazem, will stop lisinopril for now.  Disposition: F/u with APP in 3-4 weeks - if no availability, will request afib clinic. I also reminded him of the need to f/u with pulmonology for repeat CT as was due 12/2019.  Medication Adjustments/Labs and Tests Ordered: Current medicines are reviewed at length with the patient today.  Concerns regarding medicines are outlined above. Medication changes, Labs and Tests ordered today are summarized above and listed in the Patient Instructions accessible in Encounters.     Signed, Darrick Penna, PA-C  02/08/2020 2:22 PM     [facility] [place] [facility] Location in [facility] [address] [place], [address] [place] Ph: [phone];  Fax [phone]

## 2020-02-09 ENCOUNTER — Telehealth: Payer: Self-pay | Admitting: *Deleted

## 2020-02-09 ENCOUNTER — Other Ambulatory Visit (HOSPITAL_COMMUNITY)
Admission: RE | Admit: 2020-02-09 | Discharge: 2020-02-09 | Disposition: A | Discharge status: Home / Self Care | Payer: PPO | Source: Ambulatory Visit | Attending: Physician Assistant | Admitting: Physician Assistant

## 2020-02-09 ENCOUNTER — Encounter: Payer: Self-pay | Admitting: Physician Assistant

## 2020-02-09 DIAGNOSIS — E875 Hyperkalemia: Secondary | ICD-10-CM

## 2020-02-09 LAB — POTASSIUM: Potassium: 5.9 mmol/L — ABNORMAL HIGH (ref 3.5–5.1)

## 2020-02-09 MED ORDER — LOKELMA 10 G PO PACK: 10.0000 g | PACK | Freq: Every day | ORAL | 0 refills | Status: DC

## 2020-02-09 NOTE — Telephone Encounter (Signed)
-----   Message from Laurann Montana, [place] sent at 02/09/2020  1:43 PM EST ----- Please let pt know potassium remains quite elevated, similar to prior value. This was 5.3 in 2020, 5.7 yesterday, 5.9 today. Please reiterate to patient the importance of staying off lisinopril altogether. As per prior result note, he should examine any vitamins or supplements he's taking to make sure he is not getting excess potassium. Some salt substitutes have potassium also. Avoid eating excess potassium in diet (I.e. limit bananas, squash, yogurt, white beans, sweet potatoes, avocados for now).  I had tentatively discussed his case with nephrology at [facility] as well as Dr. Wyline Mood. EKG was stable yesterday aside from new atrial flutter. Per our discussion, please have patient start Lokelma 10mg  daily for 4 days (FIRST DOSE ASAP) with recheck stat BMET on Monday AM as well as Thursday AM to trend to make sure staying wnl off lisinopril. If Thursday's value is OK would probably repeat 1 week after that to trend.  If at any point he develops symptoms related to high potassium including belly pain, chest pain, worsening palpitations, muscle weakness or nausea/vomiting in the upcoming days -> proceed to ER (don't drive self).  When you send [facility] into pharmacy please make sure his pharmacy has it in stock to pick up ASAP.  Other recommendations: - place referral to nephrology given kidney and potassium issues - would be good to establish care with them - I reviewed his case [facility] with vascular team who said he is OK to stop aspirin since he is starting Eliquis, so please make this change in [facility] as well and let pt know  Will cc to [facility] since she is covering my inbox next week as well as his PCP. Thank you!

## 2020-02-09 NOTE — Telephone Encounter (Signed)
-----   Message from Laurann Montana, [place] sent at 02/09/2020  1:53 PM EST ----- To clarify: Lokelma dose is 10g not mg#

## 2020-02-09 NOTE — Telephone Encounter (Signed)
Pt notified and voiced understanding 

## 2020-02-09 NOTE — Telephone Encounter (Signed)
Pt notified and voiced understanding. Orders placed.  

## 2020-02-09 NOTE — Telephone Encounter (Signed)
-----   Message from Laurann Montana, [place] sent at 02/09/2020  7:55 AM EST ----- Please let pt know labs look OK except potassium level is elevated for unclear reasons. We stopped lisinopril yesterday completely - remain off this medicine. He should examine any vitamins or supplements he's taking to make sure he is not getting excess potassium. Some salt substitutes have potassium also. Avoid eating excess potassium in diet (I.e. excessive amounts of bananas, squash, yogurt, white beans, sweet potatoes, leafy greens, and avocados). Please have him hydrate well and come in this morning for a stat repeat potassium level only. Thank you.

## 2020-02-10 NOTE — Telephone Encounter (Signed)
Aspirin still listed in [facility], can you remove? (see note below re: stopping aspirin as well). Thx!

## 2020-02-12 ENCOUNTER — Other Ambulatory Visit: Payer: Self-pay

## 2020-02-12 ENCOUNTER — Other Ambulatory Visit (HOSPITAL_COMMUNITY)
Admission: RE | Admit: 2020-02-12 | Discharge: 2020-02-12 | Disposition: A | Discharge status: Home / Self Care | Payer: PPO | Source: Ambulatory Visit | Attending: Physician Assistant | Admitting: Physician Assistant

## 2020-02-12 DIAGNOSIS — H52203 Unspecified astigmatism, bilateral: Secondary | ICD-10-CM | POA: Diagnosis not present

## 2020-02-12 DIAGNOSIS — H5213 Myopia, bilateral: Secondary | ICD-10-CM | POA: Diagnosis not present

## 2020-02-12 DIAGNOSIS — E875 Hyperkalemia: Secondary | ICD-10-CM | POA: Insufficient documentation

## 2020-02-12 DIAGNOSIS — H353132 Nonexudative age-related macular degeneration, bilateral, intermediate dry stage: Secondary | ICD-10-CM | POA: Diagnosis not present

## 2020-02-12 DIAGNOSIS — Z961 Presence of intraocular lens: Secondary | ICD-10-CM | POA: Diagnosis not present

## 2020-02-12 DIAGNOSIS — H524 Presbyopia: Secondary | ICD-10-CM | POA: Diagnosis not present

## 2020-02-12 LAB — BASIC METABOLIC PANEL
Anion gap: 8 (ref 5–15)
BUN: 26 mg/dL — ABNORMAL HIGH (ref 8–23)
CO2: 27 mmol/L (ref 22–32)
Calcium: 9 mg/dL (ref 8.9–10.3)
Chloride: 103 mmol/L (ref 98–111)
Creatinine, Ser: 1.62 mg/dL — ABNORMAL HIGH (ref 0.61–1.24)
GFR, Estimated: 45 mL/min — ABNORMAL LOW (ref >60)
Glucose, Bld: 141 mg/dL — ABNORMAL HIGH (ref 70–99)
Potassium: 4.2 mmol/L (ref 3.5–5.1)
Sodium: 138 mmol/L (ref 135–145)

## 2020-02-12 NOTE — Addendum Note (Signed)
Addended by: Kerney Elbe on: 02/12/2020 10:08 AM   Modules accepted: Orders

## 2020-02-12 NOTE — Telephone Encounter (Signed)
Done

## 2020-02-14 ENCOUNTER — Ambulatory Visit (HOSPITAL_COMMUNITY)
Admission: RE | Admit: 2020-02-14 | Discharge: 2020-02-14 | Disposition: A | Discharge status: Home / Self Care | Payer: PPO | Source: Ambulatory Visit | Attending: Physician Assistant | Admitting: Physician Assistant

## 2020-02-14 ENCOUNTER — Other Ambulatory Visit: Payer: Self-pay

## 2020-02-14 ENCOUNTER — Encounter (HOSPITAL_COMMUNITY): Payer: Self-pay

## 2020-02-14 DIAGNOSIS — I4892 Unspecified atrial flutter: Secondary | ICD-10-CM

## 2020-02-15 ENCOUNTER — Encounter (INDEPENDENT_AMBULATORY_CARE_PROVIDER_SITE_OTHER): Payer: Self-pay | Admitting: Ophthalmology

## 2020-02-15 ENCOUNTER — Ambulatory Visit (INDEPENDENT_AMBULATORY_CARE_PROVIDER_SITE_OTHER): Payer: PPO | Admitting: Ophthalmology

## 2020-02-15 ENCOUNTER — Other Ambulatory Visit: Payer: Self-pay

## 2020-02-15 ENCOUNTER — Other Ambulatory Visit (HOSPITAL_COMMUNITY)
Admission: RE | Admit: 2020-02-15 | Discharge: 2020-02-15 | Disposition: A | Discharge status: Home / Self Care | Payer: PPO | Source: Ambulatory Visit | Attending: Physician Assistant | Admitting: Physician Assistant

## 2020-02-15 DIAGNOSIS — H353124 Nonexudative age-related macular degeneration, left eye, advanced atrophic with subfoveal involvement: Secondary | ICD-10-CM | POA: Diagnosis not present

## 2020-02-15 DIAGNOSIS — H353113 Nonexudative age-related macular degeneration, right eye, advanced atrophic without subfoveal involvement: Secondary | ICD-10-CM | POA: Diagnosis not present

## 2020-02-15 DIAGNOSIS — E875 Hyperkalemia: Secondary | ICD-10-CM | POA: Diagnosis not present

## 2020-02-15 LAB — BASIC METABOLIC PANEL
Anion gap: 9 (ref 5–15)
BUN: 23 mg/dL (ref 8–23)
CO2: 25 mmol/L (ref 22–32)
Calcium: 9.5 mg/dL (ref 8.9–10.3)
Chloride: 106 mmol/L (ref 98–111)
Creatinine, Ser: 1.55 mg/dL — ABNORMAL HIGH (ref 0.61–1.24)
GFR, Estimated: 47 mL/min — ABNORMAL LOW (ref >60)
Glucose, Bld: 102 mg/dL — ABNORMAL HIGH (ref 70–99)
Potassium: 5.1 mmol/L (ref 3.5–5.1)
Sodium: 140 mmol/L (ref 135–145)

## 2020-02-15 NOTE — Progress Notes (Signed)
02/15/2020     CHIEF COMPLAINT Patient presents for Retina Evaluation (NP AMD OU - Ref'd by Dr. Eldridge AbrahamsShapiro//Pt c/o gradually decreasing VA OU x 1 year since CEIOL OU. Pt c/o difficulty reading OU. Pt denies distortion OU.)   HISTORY OF PRESENT ILLNESS: Blake Cherry is a 74 y.o. male who presents to the clinic today for:   HPI    Retina Evaluation    In both eyes.  This started 1 year ago.  Duration of 1 year.  Context:  distance vision, mid-range vision and near vision.  Treatments tried include no treatments.  Response to treatment was no improvement. Additional comments: NP AMD OU - Ref'd by Dr. Nile RiggsShapiro  Pt c/o gradually decreasing VA OU x 1 year since CEIOL OU. Pt c/o difficulty reading OU. Pt denies distortion OU.       Last edited by Ileana RoupKennerly, Paige, COA on 02/15/2020  8:31 AM. (History)      Referring physician: Babs SciaraLuking, Scott A, MD [address] [place] [id number]  HISTORICAL INFORMATION:   Selected notes from the MEDICAL RECORD NUMBER    Lab Results  Component Value Date   HGBA1C 6.3 (H) 09/17/2014     CURRENT MEDICATIONS: No current outpatient medications on file. (Ophthalmic Drugs)   No current facility-administered medications for this visit. (Ophthalmic Drugs)   Current Outpatient Medications (Other)  Medication Sig  . albuterol (PROVENTIL) (2.5 MG/3ML) 0.083% nebulizer solution USE 1 VIAL IN NEBULIZER EVERY 4 HOURS AS NEEDED FOR WHEEZING OR SHORTNESS OF BREATH  . apixaban (ELIQUIS) 5 MG TABS tablet Take 1 tablet (5 mg total) by mouth 2 (two) times daily.  . budesonide (PULMICORT) 0.5 MG/2ML nebulizer solution USE 1 VIAL IN NEBULIZER TWICE DAILY  . budesonide-formoterol (SYMBICORT) 160-4.5 MCG/ACT inhaler Inhale 2 puffs by mouth twice daily  . diltiazem (CARDIZEM CD) 300 MG 24 hr capsule Take 1 capsule (300 mg total) by mouth daily.  Marland Kitchen. guaiFENesin (MUCINEX) 600 MG 12 hr tablet Take 1 tablet (600 mg total) by mouth 2 (two) times daily.  (Patient taking differently: Take 600 mg by mouth as needed.)  . mometasone (ELOCON) 0.1 % cream Apply BID PRN  . PROAIR HFA 108 (90 Base) MCG/ACT inhaler INHALE 2 PUFFS BY MOUTH EVERY 6 HOURS AS NEEDED FOR WHEEZING FOR SHORTNESS OF BREATH  . sodium zirconium cyclosilicate (LOKELMA) 10 g PACK packet Take 10 g by mouth daily.   No current facility-administered medications for this visit. (Other)      REVIEW OF SYSTEMS:    ALLERGIES No Known Allergies  PAST MEDICAL HISTORY Past Medical History:  Diagnosis Date  . AAA (abdominal aortic aneurysm) (HCC)   . Carotid artery occlusion 2007   Left cea  . CKD (chronic kidney disease), stage III (HCC)   . COPD (chronic obstructive pulmonary disease) (HCC)   . Diverticulitis    last time 4 years ago  . Hypertension   . Shortness of breath    with exertion  . Stroke [facility](HCC) 05/2005    2 in 2007.  Weakness and numbness in left arm and leg  . SVT (supraventricular tachycardia) (HCC)    Past Surgical History:  Procedure Laterality Date  . ABDOMINAL AORTAGRAM N/A 03/10/2013   Procedure: ABDOMINAL Ronny FlurryAORTAGRAM;  Surgeon: Sherren Kernsharles E Fields, MD;  [facility];  Service: Cardiovascular;  Laterality: N/A;  . ABDOMINAL AORTIC ENDOVASCULAR STENT GRAFT N/A 08/22/2012   Procedure: ABDOMINAL AORTIC ENDOVASCULAR STENT GRAFT;  Surgeon: Janetta Horaharles E  Fields, MD;  Location: [facility] OR;  Service: Vascular;  Laterality: N/A;  . CAROTID ENDARTERECTOMY Left 2007  . CAROTID STENT  2007   left internal carotid artery  . TESTICLE TORSION REDUCTION     age of 34    FAMILY HISTORY Family History  Problem Relation Age of Onset  . Cancer Mother        Breast cancer  . Hypertension Mother   . Diabetes Mother   . Diabetes Father     SOCIAL HISTORY Social History   Tobacco Use  . Smoking status: Current Every Day Smoker    Packs/day: 0.25    Years: 50.00    Pack years: 12.50    Types: Cigarettes    Last attempt to quit: 10/01/2017    Years since  quitting: 2.3  . Smokeless tobacco: Former Neurosurgeon    Types: Snuff  Vaping Use  . Vaping Use: Never used  Substance Use Topics  . Alcohol use: No    Alcohol/week: 0.0 standard drinks  . Drug use: No         OPHTHALMIC EXAM: Base Eye Exam    Visual Acuity (ETDRS)      Right Left   Dist cc 20/30 +1 20/50   Dist ph cc NI NI   Correction: Glasses       Tonometry (Tonopen, 8:31 AM)      Right Left   Pressure 19 18       Pupils      Dark Light Shape React APD   Right 4 3 Round Brisk None   Left 3 2 Round Brisk None       Visual Fields (Counting fingers)      Left Right    Full Full       Extraocular Movement      Right Left    Full Full       Neuro/Psych    Oriented x3: Yes   Mood/Affect: Normal       Dilation    Both eyes: 1.0% Mydriacyl, 2.5% Phenylephrine @ 8:35 AM        Slit Lamp and Fundus Exam    External Exam      Right Left   External Normal Normal       Slit Lamp Exam      Right Left   Lids/Lashes Normal Normal   Conjunctiva/Sclera White and quiet White and quiet   Cornea Clear Clear   Anterior Chamber Deep and quiet Deep and quiet   Iris Round and reactive Round and reactive   Lens Centered posterior chamber intraocular lens, PC Clear Centered posterior chamber intraocular lens, PC Clear   Anterior Vitreous Normal Normal       Fundus Exam      Right Left   Posterior Vitreous Normal Normal   Disc Normal Normal   C/D Ratio 0.25 0.35   Macula Geographic atrophy, Pigmented atrophy, Splits the FAZ, no exudates, no hemorrhage, no macular thickening Geographic atrophy, Pigmented atrophy, In the FAZ, no macular thickening, no hemorrhage, no exudates   Vessels Normal Normal   Periphery Normal Normal          IMAGING AND PROCEDURES  Imaging and Procedures for 02/15/20  OCT, Retina - OU - Both Eyes       Right Eye Quality was good. Scan locations included subfoveal. Central Foveal Thickness: 286. Progression has no prior data.  Findings include outer retinal atrophy, central retinal atrophy, subretinal hyper-reflective material.  Left Eye Quality was good. Scan locations included subfoveal. Central Foveal Thickness: 284. Progression has no prior data. Findings include subretinal hyper-reflective material.   Notes No signs of active CNVM OU, extensive outer retinal disruption by RPE migration, drusenoid appearance and RPE choriocapillaris atrophy.                ASSESSMENT/PLAN:  Advanced nonexudative age-related macular degeneration of left eye with subfoveal involvement The nature of dry age related macular degeneration was discussed with the patient as well as its possible conversion to wet. The results of the AREDS 2 study was discussed with the patient. A diet rich in dark leafy green vegetables was advised and specific recommendations were made regarding supplements with AREDS 2 formulation . Control of hypertension and serum cholesterol may slow the disease. Smoking cessation is mandatory to slow the disease and diminish the risk of progressing to wet age related macular degeneration. The patient was instructed in the use of an Amsler Grid and was told to return immediately for any changes in the Grid. Stressed to the patient do not rub eyes  The nature of age realated macular degeneration (ARMD)is explained as follows: The dry form refers to the progressive loss of normal blood supply to the central vision as a result of a combination of factors which include aging blood supply (arteriosclerosis, hardening of the arteries), genetics, smoking habits, and history of hypertension. Currently, no eye medications or vitamins slow this type of aging effect upon vision, however cessation of smoking and controlling hypertension help slow the disorder. The following analogy helps explain this: I describe the dry form of ARMD like a house of the same age as your eyes, which shows typical wear and tear of age upon the  house structure and appearance. Like the aging house which can fall down structurally, the dry form of ARMD can deteriorate the structure of the macula (center) of the retina, most often gradually and affect central vision tasks such as reading and driving. The wet form of ARMD refers to the development of abnormally growing blood vessels usually near or under the central vision, with potential risk of permanent visual changes or vision losses. This complication of the Dry form of ARMD may be moderately reduced by use of AREDS 2 formula multivitamins daily. I describe the Wet form of ARMD as like the development of a fire in an aging house (DRY ARMD). It may develop suddenly, progress rapidly and be destructive based on where it starts and how big the fire is when found. In the eye, the house fire analogy refers to the abnormal blood vessels growing destructively near the central vison in the retina, or film of the eye. Halting the growth of blood vessels with laser (hot or cold) or injectable medications is best way "to put the fire out". Many patients will experience a stabilization or even improvement in vision with a treatment course, while others may still face a loss of vision. The use of injectable medications has revolutionized therapy and is currently the only proven therapy to provide the chance of stable or improved acuity from new and recent destructive wet ARMD.      ICD-10-CM   1. 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 Advanced nonexudative age-related macular degeneration of left eye with subfoveal involvement  H35.3124 OCT, Retina - OU - Both Eyes    1.  I discussed with the  patient the typical clinical course of dry age-related macular degeneration, stands at his condition can stabilize at any time  Currently there are no signs of wet ARMD, no active CNVM, in either eye.  2.  I explained  that should new onset visual acuity declines disc develop or distortions in either eye prompt reevaluation would be appropriate.  3.  Ophthalmic Meds Ordered this visit:  No orders of the defined types were placed in this encounter.      Return in about 6 months (around 08/15/2020) for DILATE OU, OCT.  There are no Patient Instructions on file for this visit.   Explained the diagnoses, plan, and follow up with the patient and they expressed understanding.  Patient expressed understanding of the importance of proper follow up care.   Alford Highland Ferrin Liebig M.D. Diseases & Surgery of the Retina and Vitreous [facility] 02/15/20     Abbreviations: M myopia (nearsighted); A astigmatism; H hyperopia (farsighted); P presbyopia; Mrx spectacle prescription;  CTL contact lenses; OD right eye; OS left eye; OU both eyes  XT exotropia; ET esotropia; PEK punctate epithelial keratitis; PEE punctate epithelial erosions; DES dry eye syndrome; MGD meibomian gland dysfunction; ATs artificial tears; PFAT's preservative free artificial tears; NSC nuclear sclerotic cataract; PSC posterior subcapsular cataract; ERM epi-retinal membrane; PVD posterior vitreous detachment; RD retinal detachment; DM diabetes mellitus; DR diabetic retinopathy; NPDR non-proliferative diabetic retinopathy; PDR proliferative diabetic retinopathy; CSME clinically significant macular edema; DME diabetic macular edema; dbh dot blot hemorrhages; CWS cotton wool spot; POAG primary open angle glaucoma; C/D cup-to-disc ratio; HVF humphrey visual field; GVF goldmann visual field; OCT optical coherence tomography; IOP intraocular pressure; BRVO Branch retinal vein occlusion; CRVO central retinal vein occlusion; CRAO central retinal artery occlusion; BRAO branch retinal artery occlusion; RT retinal tear; SB scleral buckle; PPV pars plana vitrectomy; VH Vitreous hemorrhage; PRP panretinal laser photocoagulation; IVK intravitreal kenalog;  VMT vitreomacular traction; MH Macular hole;  NVD neovascularization of the disc; NVE neovascularization elsewhere; AREDS age related eye disease study; ARMD age related macular degeneration; POAG primary open angle glaucoma; EBMD epithelial/anterior basement membrane dystrophy; ACIOL anterior chamber intraocular lens; IOL intraocular lens; PCIOL posterior chamber intraocular lens; Phaco/IOL phacoemulsification with intraocular lens placement; PRK photorefractive keratectomy; LASIK laser assisted in situ keratomileusis; HTN hypertension; DM diabetes mellitus; COPD chronic obstructive pulmonary disease

## 2020-02-15 NOTE — Assessment & Plan Note (Signed)
The nature of dry age related macular degeneration was discussed with the patient as well as its possible conversion to wet. The results of the AREDS 2 study was discussed with the patient. A diet rich in dark leafy green vegetables was advised and specific recommendations were made regarding supplements with AREDS 2 formulation . Control of hypertension and serum cholesterol may slow the disease. Smoking cessation is mandatory to slow the disease and diminish the risk of progressing to wet age related macular degeneration. The patient was instructed in the use of an Amsler Grid and was told to return immediately for any changes in the Grid. Stressed to the patient do not rub eyes  The nature of age realated macular degeneration (ARMD)is explained as follows: The dry form refers to the progressive loss of normal blood supply to the central vision as a result of a combination of factors which include aging blood supply (arteriosclerosis, hardening of the arteries), genetics, smoking habits, and history of hypertension. Currently, no eye medications or vitamins slow this type of aging effect upon vision, however cessation of smoking and controlling hypertension help slow the disorder. The following analogy helps explain this: I describe the dry form of ARMD like a house of the same age as your eyes, which shows typical wear and tear of age upon the house structure and appearance. Like the aging house which can fall down structurally, the dry form of ARMD can deteriorate the structure of the macula (center) of the retina, most often gradually and affect central vision tasks such as reading and driving. The wet form of ARMD refers to the development of abnormally growing blood vessels usually near or under the central vision, with potential risk of permanent visual changes or vision losses. This complication of the Dry form of ARMD may be moderately reduced by use of AREDS 2 formula multivitamins daily. I describe the  Wet form of ARMD as like the development of a fire in an aging house (DRY ARMD). It may develop suddenly, progress rapidly and be destructive based on where it starts and how big the fire is when found. In the eye, the house fire analogy refers to the abnormal blood vessels growing destructively near the central vison in the retina, or film of the eye. Halting the growth of blood vessels with laser (hot or cold) or injectable medications is best way "to put the fire out". Many patients will experience a stabilization or even improvement in vision with a treatment course, while others may still face a loss of vision. The use of injectable medications has revolutionized therapy and is currently the only proven therapy to provide the chance of stable or improved acuity from new and recent destructive wet ARMD. 

## 2020-02-19 ENCOUNTER — Other Ambulatory Visit (HOSPITAL_COMMUNITY)
Admission: RE | Admit: 2020-02-19 | Discharge: 2020-02-19 | Disposition: A | Discharge status: Home / Self Care | Payer: PPO | Source: Ambulatory Visit | Attending: Physician Assistant | Admitting: Physician Assistant

## 2020-02-19 ENCOUNTER — Telehealth: Payer: Self-pay

## 2020-02-19 ENCOUNTER — Other Ambulatory Visit: Payer: Self-pay

## 2020-02-19 DIAGNOSIS — Z79899 Other long term (current) drug therapy: Secondary | ICD-10-CM

## 2020-02-19 DIAGNOSIS — E875 Hyperkalemia: Secondary | ICD-10-CM | POA: Diagnosis not present

## 2020-02-19 LAB — BASIC METABOLIC PANEL
Anion gap: 9 (ref 5–15)
BUN: 24 mg/dL — ABNORMAL HIGH (ref 8–23)
CO2: 26 mmol/L (ref 22–32)
Calcium: 8.9 mg/dL (ref 8.9–10.3)
Chloride: 105 mmol/L (ref 98–111)
Creatinine, Ser: 1.67 mg/dL — ABNORMAL HIGH (ref 0.61–1.24)
GFR, Estimated: 43 mL/min — ABNORMAL LOW (ref >60)
Glucose, Bld: 98 mg/dL (ref 70–99)
Potassium: 4.4 mmol/L (ref 3.5–5.1)
Sodium: 140 mmol/L (ref 135–145)

## 2020-02-19 NOTE — Telephone Encounter (Signed)
Order for BMET placed, patient aware

## 2020-02-19 NOTE — Telephone Encounter (Signed)
-----   Message from Leone Brand, NP sent at 02/16/2020 10:17 AM EST ----- Pt needs repeat BMP on Monday the 27th.   For hyperkalemia.  I called and talked with him 02/16/20 - he no longer takes the lisinopril, discussed staying away from high K+ foods.  And recheck labs Monday and may need more Lokelma.  His kidney function is improving though.  Pt aware and will have labs done Monday, please place order

## 2020-02-20 ENCOUNTER — Other Ambulatory Visit: Payer: Self-pay | Admitting: Family Medicine

## 2020-02-20 DIAGNOSIS — I10 Essential (primary) hypertension: Secondary | ICD-10-CM

## 2020-02-26 ENCOUNTER — Telehealth: Payer: Self-pay | Admitting: *Deleted

## 2020-02-26 ENCOUNTER — Telehealth (INDEPENDENT_AMBULATORY_CARE_PROVIDER_SITE_OTHER): Payer: PPO | Admitting: Family Medicine

## 2020-02-26 ENCOUNTER — Other Ambulatory Visit: Payer: Self-pay

## 2020-02-26 ENCOUNTER — Encounter: Payer: Self-pay | Admitting: Family Medicine

## 2020-02-26 DIAGNOSIS — I129 Hypertensive chronic kidney disease with stage 1 through stage 4 chronic kidney disease, or unspecified chronic kidney disease: Secondary | ICD-10-CM | POA: Diagnosis not present

## 2020-02-26 DIAGNOSIS — J441 Chronic obstructive pulmonary disease with (acute) exacerbation: Secondary | ICD-10-CM | POA: Diagnosis not present

## 2020-02-26 DIAGNOSIS — N1832 Chronic kidney disease, stage 3b: Secondary | ICD-10-CM | POA: Diagnosis not present

## 2020-02-26 DIAGNOSIS — N2581 Secondary hyperparathyroidism of renal origin: Secondary | ICD-10-CM | POA: Diagnosis not present

## 2020-02-26 DIAGNOSIS — D631 Anemia in chronic kidney disease: Secondary | ICD-10-CM | POA: Diagnosis not present

## 2020-02-26 MED ORDER — AMOXICILLIN-POT CLAVULANATE 875-125 MG PO TABS: 1.0000 | ORAL_TABLET | Freq: Two times a day (BID) | ORAL | 0 refills | Status: DC

## 2020-02-26 MED ORDER — PREDNISONE 20 MG PO TABS: ORAL_TABLET | ORAL | 0 refills | Status: DC

## 2020-02-26 NOTE — Telephone Encounter (Signed)
[name], saber are scheduled for a virtual visit with your provider today.    Just as we do with appointments in the office, we must obtain your consent to participate.  Your consent will be active for this visit and any virtual visit you may have with one of our providers in the next 365 days.    If you have a MyChart account, I can also send a copy of this consent to you electronically.  All virtual visits are billed to your insurance company just like a traditional visit in the office.  As this is a virtual visit, video technology does not allow for your provider to perform a traditional examination.  This may limit your provider's ability to fully assess your condition.  If your provider identifies any concerns that need to be evaluated in person or the need to arrange testing such as labs, EKG, etc, we will make arrangements to do so.    Although advances in technology are sophisticated, we cannot ensure that it will always work on either your end or our end.  If the connection with a video visit is poor, we may have to switch to a telephone visit.  With either a video or telephone visit, we are not always able to ensure that we have a secure connection.   I need to obtain your verbal consent now.   Are you willing to proceed with your visit today?   Blake Cherry has provided verbal consent on 02/26/2020 for a virtual visit (video or telephone).   Kathleen Lime, RN 02/26/2020  10:14 AM

## 2020-02-26 NOTE — Progress Notes (Signed)
Patient ID: Blake Cherry, male    DOB: 1945-12-05, 75 y.o.   MRN: [id number]   Chief Complaint  Patient presents with  . Cough    Congestion since last week   Subjective:  CC; COPD exacerbation: congestion, cough, shortness of breath  This is a new problem.  Presents today via telephone visit with a complaint of COPD exacerbation.  Reports symptoms are congestion, cough, with sputum production, shortness of breath present x1 week.  T-max 100.0.  Shortness of breath is worse than his baseline with COPD.  His breathing was bad yesterday he was unable to make church, this is unusual for him to miss church.  He has had both of his Covid vaccines and booster.  Has not been Covid tested.  He is able to converse with me throughout the telephone visit today.    Medical History Marky has a past medical history of AAA (abdominal aortic aneurysm) (HCC), Carotid artery occlusion (2007), CKD (chronic kidney disease), stage III (HCC), COPD (chronic obstructive pulmonary disease) (HCC), Diverticulitis, Hypertension, Shortness of breath, Stroke (HCC) (05/2005), and SVT (supraventricular tachycardia) (HCC).   Outpatient Encounter Medications as of 02/26/2020  Medication Sig  . amoxicillin-clavulanate (AUGMENTIN) 875-125 MG tablet Take 1 tablet by mouth 2 (two) times daily.  . predniSONE (DELTASONE) 20 MG tablet Take 3 tablets by mouth for 4 days, then 2 tablets by mouth for 4 days, then one tablet by mouth for 4 days.  Marland Kitchen albuterol (PROVENTIL) (2.5 MG/3ML) 0.083% nebulizer solution USE 1 VIAL IN NEBULIZER EVERY 4 HOURS AS NEEDED FOR WHEEZING OR SHORTNESS OF BREATH  . apixaban (ELIQUIS) 5 MG TABS tablet Take 1 tablet (5 mg total) by mouth 2 (two) times daily.  . budesonide (PULMICORT) 0.5 MG/2ML nebulizer solution USE 1 VIAL IN NEBULIZER TWICE DAILY  . budesonide-formoterol (SYMBICORT) 160-4.5 MCG/ACT inhaler Inhale 2 puffs by mouth twice daily  . diltiazem (CARDIZEM CD) 300 MG 24 hr capsule Take 1  capsule (300 mg total) by mouth daily.  Marland Kitchen guaiFENesin (MUCINEX) 600 MG 12 hr tablet Take 1 tablet (600 mg total) by mouth 2 (two) times daily. (Patient taking differently: Take 600 mg by mouth as needed.)  . mometasone (ELOCON) 0.1 % cream Apply BID PRN  . PROAIR HFA 108 (90 Base) MCG/ACT inhaler INHALE 2 PUFFS BY MOUTH EVERY 6 HOURS AS NEEDED FOR WHEEZING FOR SHORTNESS OF BREATH  . sodium zirconium cyclosilicate (LOKELMA) 10 g PACK packet Take 10 g by mouth daily.   No facility-administered encounter medications on file as of 02/26/2020.     Review of Systems  Constitutional: Positive for chills and fever.       T-max 100.0  HENT: Positive for congestion.        Sputum production: green  Respiratory: Positive for cough and shortness of breath.        SOB worse than baseline.      Vitals There were no vitals taken for this visit.  Objective:   Physical Exam Able to converse throughout telephone call without needing to stop to catch breath.   Assessment and Plan   1. COPD exacerbation (HCC) - amoxicillin-clavulanate (AUGMENTIN) 875-125 MG tablet; Take 1 tablet by mouth 2 (two) times daily.  Dispense: 20 tablet; Refill: 0 - predniSONE (DELTASONE) 20 MG tablet; Take 3 tablets by mouth for 4 days, then 2 tablets by mouth for 4 days, then one tablet by mouth for 4 days.  Dispense: 24 tablet; Refill: 0   Due to history  of COPD, low threshold to treat congestion and low-grade fever. Worsening shortness of breath. Will extend prednisone taper to 12 days.   Agrees with plan of care discussed today. Understands warning signs to seek further care: worsening shortness of breath, chest pain, fever.  Understands to follow-up if symptoms do not improve or worsen. Understands the importance of close follow-up.  He will keep [place] informed if he is not making adequate improvement in just a few days.    Virtual Visit via Telephone Note  I connected with Zacchary Pompei Reardon on 02/26/20 at 11:20 AM  EST by telephone and verified that I am speaking with the correct person using two identifiers.  Location: Patient: home Provider: office   I discussed the limitations, risks, security and privacy concerns of performing an evaluation and management service by telephone and the availability of in person appointments. I also discussed with the patient that there may be a patient responsible charge related to this service. The patient expressed understanding and agreed to proceed.   History of Present Illness:    Observations/Objective:   Assessment and Plan:   Follow Up Instructions:    I discussed the assessment and treatment plan with the patient. The patient was provided an opportunity to ask questions and all were answered. The patient agreed with the plan and demonstrated an understanding of the instructions.   The patient was advised to call back or seek an in-person evaluation if the symptoms worsen or if the condition fails to improve as anticipated.  I provided 8 minutes of non-face-to-face time during this encounter.

## 2020-02-27 ENCOUNTER — Encounter: Payer: Self-pay | Admitting: Cardiology

## 2020-02-27 ENCOUNTER — Ambulatory Visit: Payer: PPO | Admitting: Cardiology

## 2020-02-27 VITALS — BP 110/56 | HR 144 | Ht 67.0 in | Wt 178.0 lb

## 2020-02-27 DIAGNOSIS — I4892 Unspecified atrial flutter: Secondary | ICD-10-CM | POA: Diagnosis not present

## 2020-02-27 DIAGNOSIS — Z7901 Long term (current) use of anticoagulants: Secondary | ICD-10-CM | POA: Diagnosis not present

## 2020-02-27 DIAGNOSIS — N183 Chronic kidney disease, stage 3 unspecified: Secondary | ICD-10-CM

## 2020-02-27 DIAGNOSIS — J441 Chronic obstructive pulmonary disease with (acute) exacerbation: Secondary | ICD-10-CM | POA: Diagnosis not present

## 2020-02-27 MED ORDER — DIGOXIN 125 MCG PO TABS: 0.1250 mg | ORAL_TABLET | Freq: Every day | ORAL | 3 refills | Status: DC

## 2020-02-27 NOTE — Assessment & Plan Note (Signed)
I reviewed EKGs going back to 2019- all show narrow complex tachycardia- I wonder if he hasn't been in atrial flutter with RVR for some time.  His rate is still fast in the midst of COPD exacerbation though he seems to be tolerating this well.

## 2020-02-27 NOTE — Assessment & Plan Note (Signed)
Recurrent problem- he just started steroids yesterday.

## 2020-02-27 NOTE — Patient Instructions (Signed)
Medication Instructions:  Your physician has recommended you make the following change in your medication:   Lanoxin 0.125 mg Daily   *If you need a refill on your cardiac medications before your next appointment, please call your pharmacy*   Lab Work: NONE   If you have labs (blood work) drawn today and your tests are completely normal, you will receive your results only by: Marland Kitchen MyChart Message (if you have MyChart) OR . A paper copy in the mail If you have any lab test that is abnormal or we need to change your treatment, we will call you to review the results.   Testing/Procedures: NONE   Follow-Up: At [facility], you and your health needs are our priority.  As part of our continuing mission to provide you with exceptional heart care, we have created designated Provider Care Teams.  These Care Teams include your primary Cardiologist (physician) and Advanced Practice Providers (APPs -  Physician Assistants and Nurse Practitioners) who all work together to provide you with the care you need, when you need it.  We recommend signing up for the patient portal called "MyChart".  Sign up information is provided on this After Visit Summary.  MyChart is used to connect with patients for Virtual Visits (Telemedicine).  Patients are able to view lab/test results, encounter notes, upcoming appointments, etc.  Non-urgent messages can be sent to your provider as well.   To learn more about what you can do with MyChart, go to [email or web address].    Your next appointment:   1 week(s)  The format for your next appointment:   In Person  Provider:   You will see one of the following Advanced Practice Providers on your designated Care Team:    Randall An, PA-C   Jacolyn Reedy, PA-C   Thank you for choosing [facility]!

## 2020-02-27 NOTE — Assessment & Plan Note (Signed)
He is on Eliquis.

## 2020-02-27 NOTE — Progress Notes (Signed)
Cardiology Office Note:    Date:  02/27/2020   ID:  Harrell Gave, DOB [birth year], MRN [id number]  PCP:  [name], MD  Cardiologist:  Prentice Docker, MD (Inactive)  Electrophysiologist:  None   Referring MD: [name], MD   No chief complaint on file.   History of Present Illness:    Blake Cherry is a 75 y.o. male with a hx of COPD, CRI3b, abdominal aortic aneurysm repair, and past PSVT.  He was seen in the office 02-08-2020 for 1 year follow-up.  He was found to be in atrial flutter.  His diltiazem was increased to 300 mg a day and he was placed on Eliquis 5 mg twice daily.  He was set up for an outpatient echocardiogram and a follow-up today to come up with a plan for long-term therapy.  Patient showed up for his echocardiogram but his heart rate was too fast and it was canceled.  Today in the office he is in atrial flutter with 2-1 conduction heart rate 140.  He is actually tolerating this pretty well.  He is also in the midst of a COPD exacerbation.  He just started his antibiotics and steroids yesterday.  He denies any chest pain.  He is unaware of his heart racing.  Past Medical History:  Diagnosis Date  . AAA (abdominal aortic aneurysm) (HCC)   . Carotid artery occlusion 2007   Left cea  . CKD (chronic kidney disease), stage III (HCC)   . COPD (chronic obstructive pulmonary disease) (HCC)   . Diverticulitis    last time 4 years ago  . Hypertension   . Shortness of breath    with exertion  . Stroke [facility]) 05/2005    2 in 2007.  Weakness and numbness in left arm and leg  . SVT (supraventricular tachycardia) (HCC)     Past Surgical History:  Procedure Laterality Date  . ABDOMINAL AORTAGRAM N/A 03/10/2013   Procedure: ABDOMINAL Ronny Flurry;  Surgeon: Sherren Kerns, MD;  Location: [facility] CATH LAB;  Service: Cardiovascular;  Laterality: N/A;  . ABDOMINAL AORTIC ENDOVASCULAR STENT GRAFT N/A 08/22/2012   Procedure: ABDOMINAL AORTIC ENDOVASCULAR STENT GRAFT;   Surgeon: Sherren Kerns, MD;  Location: [facility] OR;  Service: Vascular;  Laterality: N/A;  . CAROTID ENDARTERECTOMY Left 2007  . CAROTID STENT  2007   left internal carotid artery  . TESTICLE TORSION REDUCTION     age of 46    Current Medications: Current Meds  Medication Sig  . albuterol (PROVENTIL) (2.5 MG/3ML) 0.083% nebulizer solution USE 1 VIAL IN NEBULIZER EVERY 4 HOURS AS NEEDED FOR WHEEZING OR SHORTNESS OF BREATH  . amoxicillin-clavulanate (AUGMENTIN) 875-125 MG tablet Take 1 tablet by mouth 2 (two) times daily.  Marland Kitchen apixaban (ELIQUIS) 5 MG TABS tablet Take 1 tablet (5 mg total) by mouth 2 (two) times daily.  . budesonide (PULMICORT) 0.5 MG/2ML nebulizer solution USE 1 VIAL IN NEBULIZER TWICE DAILY  . budesonide-formoterol (SYMBICORT) 160-4.5 MCG/ACT inhaler Inhale 2 puffs by mouth twice daily  . digoxin (LANOXIN) 0.125 MG tablet Take 1 tablet (0.125 mg total) by mouth daily.  Marland Kitchen diltiazem (CARDIZEM CD) 300 MG 24 hr capsule Take 1 capsule (300 mg total) by mouth daily.  Marland Kitchen guaiFENesin (MUCINEX) 600 MG 12 hr tablet Take 1 tablet (600 mg total) by mouth 2 (two) times daily. (Patient taking differently: Take 600 mg by mouth as needed.)  . mometasone (ELOCON) 0.1 % cream Apply BID PRN  . predniSONE (DELTASONE)  20 MG tablet Take 3 tablets by mouth for 4 days, then 2 tablets by mouth for 4 days, then one tablet by mouth for 4 days.  Marland Kitchen PROAIR HFA 108 (90 Base) MCG/ACT inhaler INHALE 2 PUFFS BY MOUTH EVERY 6 HOURS AS NEEDED FOR WHEEZING FOR SHORTNESS OF BREATH  . sodium zirconium cyclosilicate (LOKELMA) 10 g PACK packet Take 10 g by mouth daily.     Allergies:   Patient has no known allergies.   Social History   Socioeconomic History  . Marital status: Married    Spouse name: Not on file  . Number of children: Not on file  . Years of education: Not on file  . Highest education level: Not on file  Occupational History  . Not on file  Tobacco Use  . Smoking status: Current Every Day  Smoker    Packs/day: 0.25    Years: 50.00    Pack years: 12.50    Types: Cigarettes    Last attempt to quit: 10/01/2017    Years since quitting: 2.4  . Smokeless tobacco: Former Neurosurgeon    Types: Snuff  Vaping Use  . Vaping Use: Never used  Substance and Sexual Activity  . Alcohol use: No    Alcohol/week: 0.0 standard drinks  . Drug use: No  . Sexual activity: Not on file  Other Topics Concern  . Not on file  Social History Narrative  . Not on file   Social Determinants of Health   Financial Resource Strain: Not on file  Food Insecurity: Not on file  Transportation Needs: Not on file  Physical Activity: Not on file  Stress: Not on file  Social Connections: Not on file     Family History: The patient's family history includes Cancer in his mother; Diabetes in his father and mother; Hypertension in his mother.  ROS:   Please see the history of present illness.     All other systems reviewed and are negative.  EKGs/Labs/Other Studies Reviewed:    The following studies were reviewed today:   EKG:  EKG is ordered today.  The ekg ordered today demonstrates Atrial flutter- VR 140  Recent Labs: 02/08/2020: ALT 10; Hemoglobin 14.1; Platelets 300; TSH 3.400 02/19/2020: BUN 24; Creatinine, Ser 1.67; Potassium 4.4; Sodium 140  Recent Lipid Panel    Component Value Date/Time   CHOL 138 02/08/2020 1456   CHOL 140 01/12/2019 0859   TRIG 64 02/08/2020 1456   HDL 48 02/08/2020 1456   HDL 50 01/12/2019 0859   CHOLHDL 2.9 02/08/2020 1456   VLDL 13 02/08/2020 1456   LDLCALC 77 02/08/2020 1456   LDLCALC 74 01/12/2019 0859    Physical Exam:    VS:  BP (!) 110/56   Pulse (!) 144   Ht 5\' 7"  (1.702 m)   Wt 178 lb (80.7 kg)   SpO2 98%   BMI 27.88 kg/m     Wt Readings from Last 3 Encounters:  02/27/20 178 lb (80.7 kg)  02/08/20 170 lb 12.8 oz (77.5 kg)  09/26/19 175 lb (79.4 kg)     GEN: Well nourished, well developed male in no acute distress- mild dyspnea with  conversation.  HEENT: Normal NECK: No JVD; No carotid bruits CARDIAC: decreased heart sounds, increased rate,, no murmurs, rubs, gallops RESPIRATORY:  Bilateral rhonchi and wheezing ABDOMEN: Soft, non-tender, non-distended MUSCULOSKELETAL:  No edema; No deformity  SKIN: Warm and dry NEUROLOGIC:  Alert and oriented x 3 PSYCHIATRIC:  Normal affect   ASSESSMENT:  Atrial flutter ([place]) I reviewed EKGs going back to 2019- all show narrow complex tachycardia- I wonder if he hasn't been in atrial flutter with RVR for some time.  His rate is still fast in the midst of COPD exacerbation though he seems to be tolerating this well.   Anticoagulated He is on Eliquis  COPD with acute exacerbation Recurrent problem- he just started steroids yesterday.  CRI (chronic renal insufficiency), stage 3 (moderate) (HCC) GFR 43  PLAN:     I discussed options with the patient including admission. He would like to avoid this. Despite his heart rate the patient seems to be tolerating atrial flutter well. I added Lanoxin 0.125 mg daily to his medications to see if this may help with rate control. I doubt he would be able to tolerate a beta-blocker with his COPD. Amiodarone also would be a poor choice with his chronic lung disease.   I suggested he go to the emergency room without delay if he develops increasing shortness of breath at home. He'll continue the antibiotics and steroids for his COPD as prescribed by his PCP. We will see him back next week. If his COPD is under better control and his rate has come down a little we could potentially plan on an outpatient cardioversion. He may ultimately need EP evaluation.   Medication Adjustments/Labs and Tests Ordered: Current medicines are reviewed at length with the patient today.  Concerns regarding medicines are outlined above.  Orders Placed This Encounter  Procedures  . EKG 12-Lead   Meds ordered this encounter  Medications  . digoxin (LANOXIN) 0.125  MG tablet    Sig: Take 1 tablet (0.125 mg total) by mouth daily.    Dispense:  90 tablet    Refill:  3    Patient Instructions  Medication Instructions:  Your physician has recommended you make the following change in your medication:   Lanoxin 0.125 mg Daily   *If you need a refill on your cardiac medications before your next appointment, please call your pharmacy*   Lab Work: NONE   If you have labs (blood work) drawn today and your tests are completely normal, you will receive your results only by: Marland Kitchen MyChart Message (if you have MyChart) OR . A paper copy in the mail If you have any lab test that is abnormal or we need to change your treatment, we will call you to review the results.   Testing/Procedures: NONE   Follow-Up: At [facility], you and your health needs are our priority.  As part of our continuing mission to provide you with exceptional heart care, we have created designated Provider Care Teams.  These Care Teams include your primary Cardiologist (physician) and Advanced Practice Providers (APPs -  Physician Assistants and Nurse Practitioners) who all work together to provide you with the care you need, when you need it.  We recommend signing up for the patient portal called "MyChart".  Sign up information is provided on this After Visit Summary.  MyChart is used to connect with patients for Virtual Visits (Telemedicine).  Patients are able to view lab/test results, encounter notes, upcoming appointments, etc.  Non-urgent messages can be sent to your provider as well.   To learn more about what you can do with MyChart, go to [email or web address].    Your next appointment:   1 week(s)  The format for your next appointment:   In Person  Provider:   You will see one of the following Advanced Practice Providers  on your designated Care Team:    Randall An, PA-C   Jacolyn Reedy, [place]   Thank you for choosing [facility]!         Signed, Corine Shelter, PA-C  02/27/2020 1:51 PM    [facility]

## 2020-02-27 NOTE — Assessment & Plan Note (Signed)
GFR 43 

## 2020-02-28 ENCOUNTER — Other Ambulatory Visit: Payer: Self-pay | Admitting: Internal Medicine

## 2020-02-28 DIAGNOSIS — N1832 Chronic kidney disease, stage 3b: Secondary | ICD-10-CM

## 2020-02-29 DIAGNOSIS — J440 Chronic obstructive pulmonary disease with acute lower respiratory infection: Secondary | ICD-10-CM | POA: Diagnosis not present

## 2020-03-04 NOTE — Progress Notes (Deleted)
Cardiology Office Note    Date:  03/04/2020   ID:  Blake Cherry, DOB 1945-04-17, MRN [id number]  PCP:  [name], MD  Cardiologist: Prentice Docker, MD (Inactive) EPS: None  No chief complaint on file.   History of Present Illness:  Blake Cherry is a 75 y.o. male  with a hx of COPD, CRI3b, abdominal aortic aneurysm repair, and past PSVT.  He was seen in the office 02-08-2020 for 1 year follow-up.  He was found to be in atrial flutter.  His diltiazem was increased to 300 mg a day and he was placed on Eliquis 5 mg twice daily.  He was set up for an outpatient echocardiogram and a follow-up.  Echo could not be done because heart rate was too fast he was in atrial flutter with 2-1 conduction at a rate of 140 bpm and was in the midst of a COPD exacerbation.  He saw Domenic Polite 02/27/2020 and had just been started on antibiotics and steroids the day before.  He was unaware of his heart racing.  Patient declined admission.  He discussed with Dr. Diona Browner and Lanoxin 0.125 mg is added to his medications.  He could not tolerate a beta-blocker with his COPD exacerbation and amiodarone was not a great choice with his COPD.  Plan was to see him back once his COPD exacerbation had improved and consider cardioversion.     Past Medical History:  Diagnosis Date  . AAA (abdominal aortic aneurysm) (HCC)   . Carotid artery occlusion 2007   Left cea  . CKD (chronic kidney disease), stage III (HCC)   . COPD (chronic obstructive pulmonary disease) (HCC)   . Diverticulitis    last time 4 years ago  . Hypertension   . Shortness of breath    with exertion  . Stroke [facility]) 05/2005    2 in 2007.  Weakness and numbness in left arm and leg  . SVT (supraventricular tachycardia) (HCC)     Past Surgical History:  Procedure Laterality Date  . ABDOMINAL AORTAGRAM N/A 03/10/2013   Procedure: ABDOMINAL Ronny Flurry;  Surgeon: Sherren Kerns, MD;  Location: [facility] CATH LAB;  Service: Cardiovascular;   Laterality: N/A;  . ABDOMINAL AORTIC ENDOVASCULAR STENT GRAFT N/A 08/22/2012   Procedure: ABDOMINAL AORTIC ENDOVASCULAR STENT GRAFT;  Surgeon: Sherren Kerns, MD;  Location: [facility] OR;  Service: Vascular;  Laterality: N/A;  . CAROTID ENDARTERECTOMY Left 2007  . CAROTID STENT  2007   left internal carotid artery  . TESTICLE TORSION REDUCTION     age of 62    Current Medications: No outpatient medications have been marked as taking for the 03/06/20 encounter (Appointment) with Dyann Kief, PA-C.     Allergies:   Patient has no known allergies.   Social History   Socioeconomic History  . Marital status: Married    Spouse name: Not on file  . Number of children: Not on file  . Years of education: Not on file  . Highest education level: Not on file  Occupational History  . Not on file  Tobacco Use  . Smoking status: Current Every Day Smoker    Packs/day: 0.25    Years: 50.00    Pack years: 12.50    Types: Cigarettes    Last attempt to quit: 10/01/2017    Years since quitting: 2.4  . Smokeless tobacco: Former Neurosurgeon    Types: Snuff  Vaping Use  . Vaping Use: Never used  Substance and  Sexual Activity  . Alcohol use: No    Alcohol/week: 0.0 standard drinks  . Drug use: No  . Sexual activity: Not on file  Other Topics Concern  . Not on file  Social History Narrative  . Not on file   Social Determinants of Health   Financial Resource Strain: Not on file  Food Insecurity: Not on file  Transportation Needs: Not on file  Physical Activity: Not on file  Stress: Not on file  Social Connections: Not on file     Family History:  The patient's ***family history includes Cancer in his mother; Diabetes in his father and mother; Hypertension in his mother.   ROS:   Please see the history of present illness.    ROS All other systems reviewed and are negative.   PHYSICAL EXAM:   VS:  There were no vitals taken for this visit.  Physical Exam  GEN: Well nourished, well  developed, in no acute distress  HEENT: normal  Neck: no JVD, carotid bruits, or masses Cardiac:RRR; no murmurs, rubs, or gallops  Respiratory:  clear to auscultation bilaterally, normal work of breathing GI: soft, nontender, nondistended, + BS Ext: without cyanosis, clubbing, or edema, Good distal pulses bilaterally MS: no deformity or atrophy  Skin: warm and dry, no rash Neuro:  Alert and Oriented x 3, Strength and sensation are intact Psych: euthymic mood, full affect  Wt Readings from Last 3 Encounters:  02/27/20 178 lb (80.7 kg)  02/08/20 170 lb 12.8 oz (77.5 kg)  09/26/19 175 lb (79.4 kg)      Studies/Labs Reviewed:   EKG:  EKG is*** ordered today.  The ekg ordered today demonstrates ***  Recent Labs: 02/08/2020: ALT 10; Hemoglobin 14.1; Platelets 300; TSH 3.400 02/19/2020: BUN 24; Creatinine, Ser 1.67; Potassium 4.4; Sodium 140   Lipid Panel    Component Value Date/Time   CHOL 138 02/08/2020 1456   CHOL 140 01/12/2019 0859   TRIG 64 02/08/2020 1456   HDL 48 02/08/2020 1456   HDL 50 01/12/2019 0859   CHOLHDL 2.9 02/08/2020 1456   VLDL 13 02/08/2020 1456   LDLCALC 77 02/08/2020 1456   LDLCALC 74 01/12/2019 0859    Additional studies/ records that were reviewed today include:  Echo 2016 Study Conclusions   - Left ventricle: The cavity size was normal. Wall thickness was    increased in a pattern of mild LVH. Systolic function was normal.    The estimated ejection fraction was in the range of 55% to 60%.    Indeterminant diastolic dysfunction. Wall motion was normal;    there were no regional wall motion abnormalities.  - Aortic valve: Mildly calcified annulus. Mildly thickened    leaflets. Valve area (VTI): 3.31 cm^2. Valve area (Vmax): 3.16    cm^2.  - Mitral valve: Mildly calcified annulus. Mildly thickened leaflets    .  - Atrial septum: There was increased thickness of the septum,    consistent with lipomatous hypertrophy. No defect or patent     foramen ovale was identified.  - Technically difficult study.   Transthoracic echocardiography.  M-mode, complete 2D, spectral  Doppler, and color Doppler.  Birthdate:  Patient birthdate:  August 26, 1945.  Age:  Patient is 75 yr old.  Sex:  Gender: male.  BMI: 31 kg/m^2.  Blood pressure:     124/71  Patient status:  Inpatient.  Study date:  Study date: 09/17/2014. Study time: 04:28  PM.  Location:  Bedside.     Risk Assessment/Calculations:   {  Does this patient have ATRIAL FIBRILLATION?[phone]}     ASSESSMENT:    1. Atrial flutter, unspecified type (HCC)   2. COPD with acute exacerbation (HCC)   3. CRI (chronic renal insufficiency), stage 3 (moderate) (HCC)   4. Other hyperlipidemia      PLAN:  In order of problems listed above:  Atrial flutter on Eliquis and diltiazem 300 mg daily and digoxin 0.125 mg daily.  COPD exacerbation  CKD stage III  HLD  Shared Decision Making/Informed Consent   {Are you ordering a CV Procedure (e.g. stress test, cath, DCCV, TEE, etc)?   Press F2        :[id number]}    Medication Adjustments/Labs and Tests Ordered: Current medicines are reviewed at length with the patient today.  Concerns regarding medicines are outlined above.  Medication changes, Labs and Tests ordered today are listed in the Patient Instructions below. There are no Patient Instructions on file for this visit.   Elson Clan, PA-C  03/04/2020 3:19 PM    [facility] [address] [place], [place], [place]  [address] Phone: [phone]; Fax: [phone]

## 2020-03-05 ENCOUNTER — Encounter: Payer: Self-pay | Admitting: Physician Assistant

## 2020-03-05 ENCOUNTER — Ambulatory Visit (HOSPITAL_COMMUNITY)
Admission: RE | Admit: 2020-03-05 | Discharge: 2020-03-05 | Disposition: A | Discharge status: Home / Self Care | Payer: PPO | Source: Ambulatory Visit | Attending: Acute Care | Admitting: Acute Care

## 2020-03-05 ENCOUNTER — Ambulatory Visit: Payer: PPO | Admitting: Physician Assistant

## 2020-03-05 ENCOUNTER — Other Ambulatory Visit: Payer: Self-pay

## 2020-03-05 VITALS — BP 102/42 | HR 88 | Resp 18 | Ht 67.0 in | Wt 176.0 lb

## 2020-03-05 DIAGNOSIS — N1832 Chronic kidney disease, stage 3b: Secondary | ICD-10-CM

## 2020-03-05 DIAGNOSIS — J441 Chronic obstructive pulmonary disease with (acute) exacerbation: Secondary | ICD-10-CM

## 2020-03-05 DIAGNOSIS — I4892 Unspecified atrial flutter: Secondary | ICD-10-CM | POA: Diagnosis not present

## 2020-03-05 DIAGNOSIS — F17210 Nicotine dependence, cigarettes, uncomplicated: Secondary | ICD-10-CM | POA: Insufficient documentation

## 2020-03-05 DIAGNOSIS — I10 Essential (primary) hypertension: Secondary | ICD-10-CM

## 2020-03-05 DIAGNOSIS — Z87891 Personal history of nicotine dependence: Secondary | ICD-10-CM | POA: Diagnosis not present

## 2020-03-05 DIAGNOSIS — I700 Atherosclerosis of aorta: Secondary | ICD-10-CM | POA: Diagnosis not present

## 2020-03-05 DIAGNOSIS — I714 Abdominal aortic aneurysm, without rupture, unspecified: Secondary | ICD-10-CM

## 2020-03-05 DIAGNOSIS — I2510 Atherosclerotic heart disease of native coronary artery without angina pectoris: Secondary | ICD-10-CM | POA: Diagnosis not present

## 2020-03-05 DIAGNOSIS — J479 Bronchiectasis, uncomplicated: Secondary | ICD-10-CM | POA: Diagnosis not present

## 2020-03-05 DIAGNOSIS — J432 Centrilobular emphysema: Secondary | ICD-10-CM | POA: Diagnosis not present

## 2020-03-05 NOTE — Progress Notes (Signed)
Cardiology Office Note    Date:  03/05/2020   ID:  Blake Cherry, DOB [birth year], MRN [id number]  PCP:  Blake Sciara, MD  Cardiologist: Blake Docker, MD (Inactive) EPS: None  Chief Complaint  Patient presents with  . Follow-up    History of Present Illness:  Blake Cherry is a 75 y.o. male with a hx of COPD, CRI3b, abdominal aortic aneurysm repair, and past PSVT.  He was seen in the office 02-08-2020 for 1 year follow-up.  He was found to be in atrial flutter.  His diltiazem was increased to 300 mg a day and he was placed on Eliquis 5 mg twice daily.  He was set up for an outpatient echocardiogram and a follow-up.  Echo could not be done because heart rate was too fast he was in atrial flutter with 2-1 conduction at a rate of 140 bpm and was in the midst of a COPD exacerbation.  He saw Blake Cherry 02/27/2020 and had just been started on antibiotics and steroids the day before.  He was unaware of his heart racing.  Patient declined admission.  He discussed with Dr. Diona Cherry and Lanoxin 0.125 mg is added to his medications.  He could not tolerate a beta-blocker with his COPD exacerbation and amiodarone was not a great choice with his COPD.  Plan was to see him back once his COPD exacerbation had improved and consider cardioversion.  Patient comes in for f/u. Breathing better. One more day of steroids. His HR has been 130-140/m at home but 102 here.   Past Medical History:  Diagnosis Date  . AAA (abdominal aortic aneurysm) (HCC)   . Carotid artery occlusion 2007   Left cea  . CKD (chronic kidney disease), stage III (HCC)   . COPD (chronic obstructive pulmonary disease) (HCC)   . Diverticulitis    last time 4 years ago  . Hypertension   . Shortness of breath    with exertion  . Stroke [facility]) 05/2005    2 in 2007.  Weakness and numbness in left arm and leg  . SVT (supraventricular tachycardia) (HCC)     Past Surgical History:  Procedure Laterality Date  . ABDOMINAL  AORTAGRAM N/A 03/10/2013   Procedure: ABDOMINAL Ronny Flurry;  Surgeon: Sherren Kerns, MD;  Location: [facility] CATH LAB;  Service: Cardiovascular;  Laterality: N/A;  . ABDOMINAL AORTIC ENDOVASCULAR STENT GRAFT N/A 08/22/2012   Procedure: ABDOMINAL AORTIC ENDOVASCULAR STENT GRAFT;  Surgeon: Sherren Kerns, MD;  Location: [facility] OR;  Service: Vascular;  Laterality: N/A;  . CAROTID ENDARTERECTOMY Left 2007  . CAROTID STENT  2007   left internal carotid artery  . TESTICLE TORSION REDUCTION     age of 4    Current Medications: Current Meds  Medication Sig  . albuterol (PROVENTIL) (2.5 MG/3ML) 0.083% nebulizer solution USE 1 VIAL IN NEBULIZER EVERY 4 HOURS AS NEEDED FOR WHEEZING OR SHORTNESS OF BREATH  . amoxicillin-clavulanate (AUGMENTIN) 875-125 MG tablet Take 1 tablet by mouth 2 (two) times daily.  Marland Kitchen apixaban (ELIQUIS) 5 MG TABS tablet Take 1 tablet (5 mg total) by mouth 2 (two) times daily.  . budesonide (PULMICORT) 0.5 MG/2ML nebulizer solution USE 1 VIAL IN NEBULIZER TWICE DAILY  . budesonide-formoterol (SYMBICORT) 160-4.5 MCG/ACT inhaler Inhale 2 puffs by mouth twice daily  . digoxin (LANOXIN) 0.125 MG tablet Take 1 tablet (0.125 mg total) by mouth daily.  Marland Kitchen diltiazem (CARDIZEM CD) 300 MG 24 hr capsule Take 1 capsule (300 mg total) by  mouth daily.  Marland Kitchen guaiFENesin (MUCINEX) 600 MG 12 hr tablet Take 1 tablet (600 mg total) by mouth 2 (two) times daily. (Patient taking differently: Take 600 mg by mouth as needed.)  . mometasone (ELOCON) 0.1 % cream Apply BID PRN  . predniSONE (DELTASONE) 20 MG tablet Take 3 tablets by mouth for 4 days, then 2 tablets by mouth for 4 days, then one tablet by mouth for 4 days.  Marland Kitchen PROAIR HFA 108 (90 Base) MCG/ACT inhaler INHALE 2 PUFFS BY MOUTH EVERY 6 HOURS AS NEEDED FOR WHEEZING FOR SHORTNESS OF BREATH  . sodium zirconium cyclosilicate (LOKELMA) 10 g PACK packet Take 10 g by mouth daily.     Allergies:   Patient has no known allergies.   Social History    Socioeconomic History  . Marital status: Married    Spouse name: Not on file  . Number of children: Not on file  . Years of education: Not on file  . Highest education level: Not on file  Occupational History  . Not on file  Tobacco Use  . Smoking status: Current Every Day Smoker    Packs/day: 0.25    Years: 50.00    Pack years: 12.50    Types: Cigarettes    Last attempt to quit: 10/01/2017    Years since quitting: 2.4  . Smokeless tobacco: Former Neurosurgeon    Types: Snuff  Vaping Use  . Vaping Use: Never used  Substance and Sexual Activity  . Alcohol use: No    Alcohol/week: 0.0 standard drinks  . Drug use: No  . Sexual activity: Not on file  Other Topics Concern  . Not on file  Social History Narrative  . Not on file   Social Determinants of Health   Financial Resource Strain: Not on file  Food Insecurity: Not on file  Transportation Needs: Not on file  Physical Activity: Not on file  Stress: Not on file  Social Connections: Not on file     Family History:  The patient's family history includes Cancer in his mother; Diabetes in his father and mother; Hypertension in his mother.   ROS:   Please see the history of present illness.    ROS All other systems reviewed and are negative.   PHYSICAL EXAM:   VS:  BP (!) 102/42   Pulse 88   Resp 18   Ht 5\' 7"  (1.702 m)   Wt 176 lb (79.8 kg)   SpO2 93%   BMI 27.57 kg/m   Physical Exam  GEN: Well nourished, well developed, in no acute distress  Neck: no JVD, carotid bruits, or masses Cardiac:irreg; no murmurs, rubs, or gallops  Respiratory: Decreased breath sounds with some wheezing  GI: soft, nontender, nondistended, + BS Ext: without cyanosis, clubbing, or edema, Good distal pulses bilaterally Neuro:  Alert and Oriented x 3 Psych: euthymic mood, full affect  Wt Readings from Last 3 Encounters:  03/05/20 176 lb (79.8 kg)  02/27/20 178 lb (80.7 kg)  02/08/20 170 lb 12.8 oz (77.5 kg)      Studies/Labs  Reviewed:   EKG:  EKG is  ordered today.  The ekg ordered today demonstrates atrial flutter at 102 bpm with PVC  Recent Labs: 02/08/2020: ALT 10; Hemoglobin 14.1; Platelets 300; TSH 3.400 02/19/2020: BUN 24; Creatinine, Ser 1.67; Potassium 4.4; Sodium 140   Lipid Panel    Component Value Date/Time   CHOL 138 02/08/2020 1456   CHOL 140 01/12/2019 0859   TRIG 64 02/08/2020  1456   HDL 48 02/08/2020 1456   HDL 50 01/12/2019 0859   CHOLHDL 2.9 02/08/2020 1456   VLDL 13 02/08/2020 1456   LDLCALC 77 02/08/2020 1456   LDLCALC 74 01/12/2019 0859    Additional studies/ records that were reviewed today include:   Echo 2016 Study Conclusions   - Left ventricle: The cavity size was normal. Wall thickness was    increased in a pattern of mild LVH. Systolic function was normal.    The estimated ejection fraction was in the range of 55% to 60%.    Indeterminant diastolic dysfunction. Wall motion was normal;    there were no regional wall motion abnormalities.  - Aortic valve: Mildly calcified annulus. Mildly thickened    leaflets. Valve area (VTI): 3.31 cm^2. Valve area (Vmax): 3.16    cm^2.  - Mitral valve: Mildly calcified annulus. Mildly thickened leaflets    .  - Atrial septum: There was increased thickness of the septum,    consistent with lipomatous hypertrophy. No defect or patent    foramen ovale was identified.  - Technically difficult study.   Transthoracic echocardiography.  M-mode, complete 2D, spectral  Doppler, and color Doppler.  Birthdate:  Patient birthdate:  07-08-1945.  Age:  Patient is 75 yr old.  Sex:  Gender: male.  BMI: 31 kg/m^2.  Blood pressure:     124/71  Patient status:  Inpatient.  Study date:  Study date: 09/17/2014. Study time: 04:28  PM.  Location:  Bedside.     Risk Assessment/Calculations:     CHA2DS2-VASc Score = 3  This indicates a 3.2% annual risk of stroke. The patient's score is based upon: CHF History: No HTN History: Yes Diabetes  History: No Stroke History: No Vascular Disease History: Yes Age Score: 1 Gender Score: 0        ASSESSMENT:    1. Atrial flutter, unspecified type (HCC)   2. Stage 3b chronic kidney disease (HCC)   3. COPD with acute exacerbation (HCC)   4. Essential hypertension   5. AAA (abdominal aortic aneurysm) without rupture (HCC)      PLAN:  In order of problems listed above:  Atrial flutter (HCC) EKGs reviewed with Dr. Diona Cherry dating back to 2019.  He previously was diagnosed with SVT but we wonder if he had atypical flutter back then.  Rate is better controlled today.  We will proceed with 2D echo and refer to A. fib clinic for further recommendations and treatment.   Anticoagulated He is on Eliquis and has not missed any doses.   COPD with acute exacerbation Improving with steroids and antibiotics almost finished.   CRI (chronic renal insufficiency), stage 3 (moderate) (HCC) Followed by renal   Shared Decision Making/Informed Consent        Medication Adjustments/Labs and Tests Ordered: Current medicines are reviewed at length with the patient today.  Concerns regarding medicines are outlined above.  Medication changes, Labs and Tests ordered today are listed in the Patient Instructions below. Patient Instructions  Medication Instructions:  Your physician recommends that you continue on your current medications as directed. Please refer to the Current Medication list given to you today.  *If you need a refill on your cardiac medications before your next appointment, please call your pharmacy*   Lab Work: NONE   If you have labs (blood work) drawn today and your tests are completely normal, you will receive your results only by: Marland Kitchen MyChart Message (if you have MyChart) OR . A paper  copy in the mail If you have any lab test that is abnormal or we need to change your treatment, we will call you to review the results.   Testing/Procedures: Your physician has  requested that you have an echocardiogram. Echocardiography is a painless test that uses sound waves to create images of your heart. It provides your doctor with information about the size and shape of your heart and how well your heart's chambers and valves are working. This procedure takes approximately one hour. There are no restrictions for this procedure.  Follow-Up: At [facility], you and your health needs are our priority.  As part of our continuing mission to provide you with exceptional heart care, we have created designated Provider Care Teams.  These Care Teams include your primary Cardiologist (physician) and Advanced Practice Providers (APPs -  Physician Assistants and Nurse Practitioners) who all work together to provide you with the care you need, when you need it.  We recommend signing up for the patient portal called "MyChart".  Sign up information is provided on this After Visit Summary.  MyChart is used to connect with patients for Virtual Visits (Telemedicine).  Patients are able to view lab/test results, encounter notes, upcoming appointments, etc.  Non-urgent messages can be sent to your provider as well.   To learn more about what you can do with MyChart, go to [email or web address].    Your next appointment:    Next available   The format for your next appointment:   In Person  Provider:   Nona DellSamuel McDowell, MD   Other Instructions Thank you for choosing [facility]!       Elson ClanSigned, Alysah Carton, PA-C  03/05/2020 2:01 PM    [facility] [address], [place], [place]  [address] Phone: [phone]) [phone]; Fax: [phone]([phone]

## 2020-03-05 NOTE — Patient Instructions (Signed)
Medication Instructions:  Your physician recommends that you continue on your current medications as directed. Please refer to the Current Medication list given to you today.  *If you need a refill on your cardiac medications before your next appointment, please call your pharmacy*   Lab Work: NONE   If you have labs (blood work) drawn today and your tests are completely normal, you will receive your results only by: MyChart Message (if you have MyChart) OR A paper copy in the mail If you have any lab test that is abnormal or we need to change your treatment, we will call you to review the results.   Testing/Procedures: Your physician has requested that you have an echocardiogram. Echocardiography is a painless test that uses sound waves to create images of your heart. It provides your doctor with information about the size and shape of your heart and how well your heart's chambers and valves are working. This procedure takes approximately one hour. There are no restrictions for this procedure.    Follow-Up: At [facility], you and your health needs are our priority.  As part of our continuing mission to provide you with exceptional heart care, we have created designated Provider Care Teams.  These Care Teams include your primary Cardiologist (physician) and Advanced Practice Providers (APPs -  Physician Assistants and Nurse Practitioners) who all work together to provide you with the care you need, when you need it.  We recommend signing up for the patient portal called "MyChart".  Sign up information is provided on this After Visit Summary.  MyChart is used to connect with patients for Virtual Visits (Telemedicine).  Patients are able to view lab/test results, encounter notes, upcoming appointments, etc.  Non-urgent messages can be sent to your provider as well.   To learn more about what you can do with MyChart, go to [email or web address].    Your next appointment:    Next  available   The format for your next appointment:   In Person  Provider:   Samuel McDowell, MD    Other Instructions Thank you for choosing [facility]!    

## 2020-03-06 ENCOUNTER — Other Ambulatory Visit (HOSPITAL_COMMUNITY): Payer: Self-pay | Admitting: Cardiology

## 2020-03-06 ENCOUNTER — Other Ambulatory Visit: Payer: Self-pay | Admitting: Family Medicine

## 2020-03-06 ENCOUNTER — Telehealth: Payer: Self-pay | Admitting: Acute Care

## 2020-03-06 ENCOUNTER — Ambulatory Visit: Payer: PPO | Admitting: Physician Assistant

## 2020-03-06 DIAGNOSIS — Z87891 Personal history of nicotine dependence: Secondary | ICD-10-CM

## 2020-03-06 DIAGNOSIS — F17210 Nicotine dependence, cigarettes, uncomplicated: Secondary | ICD-10-CM

## 2020-03-06 DIAGNOSIS — J441 Chronic obstructive pulmonary disease with (acute) exacerbation: Secondary | ICD-10-CM

## 2020-03-06 DIAGNOSIS — E7849 Other hyperlipidemia: Secondary | ICD-10-CM

## 2020-03-06 DIAGNOSIS — N183 Chronic kidney disease, stage 3 unspecified: Secondary | ICD-10-CM

## 2020-03-06 DIAGNOSIS — I4892 Unspecified atrial flutter: Secondary | ICD-10-CM

## 2020-03-06 NOTE — Telephone Encounter (Signed)
Received call report on lung CA screening CT  Exam date 03/05/20   Impression:   IMPRESSION: 1. 1. Multiple new nodular areas in the lung bases bilaterally, strongly favored to be of infectious or inflammatory etiology given the overall appearance in the presence of bronchiectasis. However, based on strict size criteria alone, this examination is technically categorized as Lung-RADS 4BS, suspicious. Follow up low-dose chest CT without contrast in 3 months (please use the following order, "CT CHEST LCS NODULE FOLLOW-UP W/O CM") is recommended after trial of antimicrobial therapy. 2. The "S" modifier above refers to potentially clinically significant non lung cancer related findings. Specifically, there is aortic atherosclerosis, in addition to left main and 3 vessel coronary artery disease. Please note that although the presence of coronary artery calcium documents the presence of coronary artery disease, the severity of this disease and any potential stenosis cannot be assessed on this non-gated CT examination. Assessment for potential risk factor modification, dietary therapy or pharmacologic therapy may be warranted, if clinically indicated. 3. Mild diffuse bronchial wall thickening with mild centrilobular and paraseptal emphysema; imaging findings suggestive of underlying COPD. 4. There are calcifications of the mitral annulus. Echocardiographic correlation for evaluation of potential valvular dysfunction may be warranted if clinically indicated.  Aortic Atherosclerosis (ICD10-I70.0) and Emphysema (ICD10-J43.9).   Electronically Signed   By: Trudie Reed M.D.   On: 03/06/2020 08:16  Forwarding to Maralyn Sago marked urgent

## 2020-03-06 NOTE — Telephone Encounter (Signed)
I have called the patient with the results of his low dose CT. It was read as a Lung RADS 4 B indicates suspicious findings for which additional diagnostic testing and or tissue sampling is recommended.However, radiology feels this is more infection/ inflammation. When I spoke with the patient , he has been having a COPD flare, and has just completed a  Prednisone taper and antibiotics.  We will repeat the low dose CT in 3 months Denise, please place order for 3 month follow up low dose Ct. Also please fax PCP and let them know the plan. Thanks so much.

## 2020-03-06 NOTE — Telephone Encounter (Signed)
This will be called through the screening program. Thanks so much. 

## 2020-03-07 NOTE — Addendum Note (Signed)
Addended by: Marlyn Corporal A on: 03/07/2020 09:17 AM   Modules accepted: Orders

## 2020-03-07 NOTE — Progress Notes (Signed)
See telephone note dated 03/06/2020.>> I have called the patient Needs a 3 month follow up.  Please fax results to PCP and let Dr. Gerda Diss know we are planning a 3 month follow up scan. Thanks so much.

## 2020-03-08 ENCOUNTER — Other Ambulatory Visit: Payer: Self-pay | Admitting: Family Medicine

## 2020-03-08 ENCOUNTER — Ambulatory Visit (HOSPITAL_COMMUNITY)
Admission: RE | Admit: 2020-03-08 | Discharge: 2020-03-08 | Disposition: A | Discharge status: Home / Self Care | Payer: PPO | Source: Ambulatory Visit | Attending: Family Medicine | Admitting: Family Medicine

## 2020-03-08 ENCOUNTER — Other Ambulatory Visit: Payer: Self-pay

## 2020-03-08 ENCOUNTER — Encounter (HOSPITAL_COMMUNITY): Payer: Self-pay

## 2020-03-08 DIAGNOSIS — I4892 Unspecified atrial flutter: Secondary | ICD-10-CM

## 2020-03-08 NOTE — Progress Notes (Signed)
Patient heart rate to high at present (152 bpm). Spoke with Dr. Wyline Mood he said to reschedule and he would let primary know.  Celesta Gentile, RCS

## 2020-03-12 ENCOUNTER — Encounter (HOSPITAL_COMMUNITY): Payer: Self-pay | Admitting: Nurse Practitioner

## 2020-03-12 ENCOUNTER — Other Ambulatory Visit: Payer: Self-pay

## 2020-03-12 ENCOUNTER — Ambulatory Visit (HOSPITAL_COMMUNITY)
Admission: RE | Admit: 2020-03-12 | Discharge: 2020-03-12 | Disposition: A | Discharge status: Home / Self Care | Payer: PPO | Source: Ambulatory Visit | Attending: Nurse Practitioner | Admitting: Nurse Practitioner

## 2020-03-12 VITALS — BP 120/50 | HR 105 | Ht 67.0 in | Wt 175.6 lb

## 2020-03-12 DIAGNOSIS — J449 Chronic obstructive pulmonary disease, unspecified: Secondary | ICD-10-CM | POA: Diagnosis not present

## 2020-03-12 DIAGNOSIS — Z95828 Presence of other vascular implants and grafts: Secondary | ICD-10-CM | POA: Diagnosis not present

## 2020-03-12 DIAGNOSIS — N183 Chronic kidney disease, stage 3 unspecified: Secondary | ICD-10-CM | POA: Diagnosis not present

## 2020-03-12 DIAGNOSIS — I129 Hypertensive chronic kidney disease with stage 1 through stage 4 chronic kidney disease, or unspecified chronic kidney disease: Secondary | ICD-10-CM | POA: Diagnosis not present

## 2020-03-12 DIAGNOSIS — Z79899 Other long term (current) drug therapy: Secondary | ICD-10-CM | POA: Insufficient documentation

## 2020-03-12 DIAGNOSIS — Z7901 Long term (current) use of anticoagulants: Secondary | ICD-10-CM | POA: Insufficient documentation

## 2020-03-12 DIAGNOSIS — Z8673 Personal history of transient ischemic attack (TIA), and cerebral infarction without residual deficits: Secondary | ICD-10-CM | POA: Insufficient documentation

## 2020-03-12 DIAGNOSIS — D6869 Other thrombophilia: Secondary | ICD-10-CM | POA: Diagnosis not present

## 2020-03-12 DIAGNOSIS — I779 Disorder of arteries and arterioles, unspecified: Secondary | ICD-10-CM | POA: Insufficient documentation

## 2020-03-12 DIAGNOSIS — Z7951 Long term (current) use of inhaled steroids: Secondary | ICD-10-CM | POA: Diagnosis not present

## 2020-03-12 DIAGNOSIS — I471 Supraventricular tachycardia: Secondary | ICD-10-CM | POA: Diagnosis not present

## 2020-03-12 DIAGNOSIS — F17210 Nicotine dependence, cigarettes, uncomplicated: Secondary | ICD-10-CM | POA: Insufficient documentation

## 2020-03-12 DIAGNOSIS — I10 Essential (primary) hypertension: Secondary | ICD-10-CM | POA: Insufficient documentation

## 2020-03-12 DIAGNOSIS — Z8679 Personal history of other diseases of the circulatory system: Secondary | ICD-10-CM | POA: Insufficient documentation

## 2020-03-12 DIAGNOSIS — I4891 Unspecified atrial fibrillation: Secondary | ICD-10-CM | POA: Insufficient documentation

## 2020-03-12 NOTE — Progress Notes (Addendum)
Primary Care Physician: [name], MD Referring Physician: Dayton Scrape, PA Cardiologist: Dr. Bedelia Person [name] is a 75 y.o. male with a h/o ongoing tobacco abuse, COPD,HTN, AAA s/p repair, carotid artery disease s/p L CEA,CKD, paroxysmal SVT, prior CVA,   new onset atrial flutter,  when seen by Blake Spies, PA, 02/08/20, for one year f/u. [name] was asymptomatic. Eliquis 5 mg bid was added for CHA2DS2VASc score of 5. Cardizem was increased. Echo was scheduled but it was cancelled when he showed up with RVR.at 140 bpm.  He also was on antibiotics and prednisone at the time for a COPD exacerbation. Again he was tolerating well.  He  then had f/u with Corine Shelter, PA, 02/27/20 for the RVR. He was started on digoxin and referred here.  Ekg today shows afib at 105 bpm. He has been on anticoagulation long enough to be considered for DCCV, but he has missed several doses. He is still tolerating well. He is over his COPD exacerbation. He has lasix  that he takes  for a few days if he notices his weight has increased. His  weight has been stable.   Today, he denies symptoms of palpitations, chest pain, shortness of breath, orthopnea, PND, lower extremity edema, dizziness, presyncope, syncope, or neurologic sequela. The patient is tolerating medications without difficulties and is otherwise without complaint today.   Past Medical History:  Diagnosis Date  . AAA (abdominal aortic aneurysm) (HCC)   . Carotid artery occlusion 2007   Left cea  . CKD (chronic kidney disease), stage III (HCC)   . COPD (chronic obstructive pulmonary disease) (HCC)   . Diverticulitis    last time 4 years ago  . Hypertension   . Shortness of breath    with exertion  . Stroke [facility]) 05/2005    2 in 2007.  Weakness and numbness in left arm and leg  . SVT (supraventricular tachycardia) (HCC)    Past Surgical History:  Procedure Laterality Date  . ABDOMINAL AORTAGRAM N/A 03/10/2013   Procedure: ABDOMINAL Ronny Flurry;   Surgeon: Sherren Kerns, MD;  Location: [facility] CATH LAB;  Service: Cardiovascular;  Laterality: N/A;  . ABDOMINAL AORTIC ENDOVASCULAR STENT GRAFT N/A 08/22/2012   Procedure: ABDOMINAL AORTIC ENDOVASCULAR STENT GRAFT;  Surgeon: Sherren Kerns, MD;  Location: [facility] OR;  Service: Vascular;  Laterality: N/A;  . CAROTID ENDARTERECTOMY Left 2007  . CAROTID STENT  2007   left internal carotid artery  . TESTICLE TORSION REDUCTION     age of 89    Current Outpatient Medications  Medication Sig Dispense Refill  . albuterol (PROVENTIL) (2.5 MG/3ML) 0.083% nebulizer solution USE 1 VIAL IN NEBULIZER EVERY 4 HOURS AS NEEDED FOR WHEEZING OR SHORTNESS OF BREATH 450 mL 5  . apixaban (ELIQUIS) 5 MG TABS tablet Take 1 tablet (5 mg total) by mouth 2 (two) times daily. 60 tablet 11  . budesonide (PULMICORT) 0.5 MG/2ML nebulizer solution USE 1 VIAL IN NEBULIZER TWICE DAILY 120 mL 0  . budesonide-formoterol (SYMBICORT) 160-4.5 MCG/ACT inhaler Inhale 2 puffs by mouth twice daily 33 g 6  . digoxin (LANOXIN) 0.125 MG tablet Take 1 tablet (0.125 mg total) by mouth daily. 90 tablet 3  . diltiazem (CARDIZEM CD) 300 MG 24 hr capsule Take 1 capsule (300 mg total) by mouth daily. 90 capsule 3  . furosemide (LASIX) 20 MG tablet Take by mouth.    Marland Kitchen guaiFENesin (MUCINEX) 600 MG 12 hr tablet Take 1 tablet (600  mg total) by mouth 2 (two) times daily. (Patient taking differently: Take 600 mg by mouth as needed.) 20 tablet 0  . mometasone (ELOCON) 0.1 % cream Apply BID PRN 45 g 1  . PROAIR HFA 108 (90 Base) MCG/ACT inhaler INHALE 2 PUFFS BY MOUTH EVERY 6 HOURS AS NEEDED FOR WHEEZING FOR SHORTNESS OF BREATH 9 g 5   No current facility-administered medications for this encounter.    No Known Allergies  Social History   Socioeconomic History  . Marital status: Married    Spouse name: Not on file  . Number of children: Not on file  . Years of education: Not on file  . Highest education level: Not on file  Occupational History   . Not on file  Tobacco Use  . Smoking status: Current Every Day Smoker    Packs/day: 0.25    Years: 50.00    Pack years: 12.50    Types: Cigarettes    Last attempt to quit: 10/01/2017    Years since quitting: 2.4  . Smokeless tobacco: Former Neurosurgeon    Types: Snuff  . Tobacco comment: 4-5 cigarettes daily  Vaping Use  . Vaping Use: Never used  Substance and Sexual Activity  . Alcohol use: No    Alcohol/week: 0.0 standard drinks  . Drug use: No  . Sexual activity: Not on file  Other Topics Concern  . Not on file  Social History Narrative  . Not on file   Social Determinants of Health   Financial Resource Strain: Not on file  Food Insecurity: Not on file  Transportation Needs: Not on file  Physical Activity: Not on file  Stress: Not on file  Social Connections: Not on file  Intimate Partner Violence: Not on file    Family History  Problem Relation Age of Onset  . Cancer Mother        Breast cancer  . Hypertension Mother   . Diabetes Mother   . Diabetes Father     ROS- All systems are reviewed and negative except as per the HPI above  Physical Exam: Vitals:   03/12/20 1358  BP: (!) 120/50  Pulse: (!) 105  Weight: 79.7 kg  Height: 5\' 7"  (1.702 m)   Wt Readings from Last 3 Encounters:  03/12/20 79.7 kg  03/05/20 79.8 kg  02/27/20 80.7 kg    Labs: Lab Results  Component Value Date   NA 140 02/19/2020   K 4.4 02/19/2020   CL 105 02/19/2020   CO2 26 02/19/2020   GLUCOSE 98 02/19/2020   BUN 24 (H) 02/19/2020   CREATININE 1.67 (H) 02/19/2020   CALCIUM 8.9 02/19/2020   MG 2.0 04/29/2018   Lab Results  Component Value Date   INR 1.02 08/22/2012   Lab Results  Component Value Date   CHOL 138 02/08/2020   HDL 48 02/08/2020   LDLCALC 77 02/08/2020   TRIG 64 02/08/2020     GEN- The patient is well appearing, alert and oriented x 3 today.   Head- normocephalic, atraumatic Eyes-  Sclera clear, conjunctiva pink Ears- hearing intact Oropharynx-  clear Neck- supple, no JVP Lymph- no cervical lymphadenopathy Lungs- Clear to ausculation bilaterally, normal work of breathing Heart- Regular rate and rhythm, no murmurs, rubs or gallops, PMI not laterally displaced GI- soft, NT, ND, + BS Extremities- no clubbing, cyanosis, or edema MS- no significant deformity or atrophy Skin- no rash or lesion Psych- euthymic mood, full affect Neuro- strength and sensation are intact  EKG-coarse  afib vrs flutter at 105 bpm, qrs int 96 ms, qtc 343 ms     Assessment and Plan: 1. New onset  Afib/flutter  12/18 General discussion re afib and triggers discussed  He will cut back on caffeine  We discussed DCCV but he has missed several doses of eliquis He was given the option of starting the 21 days now or DCCV with TEE He states that he is still fairly asymptomatic and he would prefer to start the clock over and wait the 21 days. He will be extra diligent to not miss Eliquis  Continue Cardizem/digoxin at current doses  2. CHA2DS2VASc score of 5 Continue eliquis 5 mg bid  I will bring back in 2 weeks to consider scheduling cardioversion at that  time    Lupita Leash [facility]. [facility] [address] [place], [place] [address] [phone]

## 2020-03-12 NOTE — Telephone Encounter (Signed)
CT results faxed to PCP. Order placed for 3 mth f/u low dose nodule f/u CT.  

## 2020-03-12 NOTE — Addendum Note (Signed)
Addended by: [name] D on: 03/12/2020 10:26 AM   Modules accepted: Orders

## 2020-03-13 ENCOUNTER — Encounter (HOSPITAL_COMMUNITY): Payer: Self-pay | Admitting: Nurse Practitioner

## 2020-03-21 DIAGNOSIS — I10 Essential (primary) hypertension: Secondary | ICD-10-CM | POA: Diagnosis not present

## 2020-03-22 LAB — BASIC METABOLIC PANEL
BUN/Creatinine Ratio: 14 (ref 10–24)
BUN: 22 mg/dL (ref 8–27)
CO2: 22 mmol/L (ref 20–29)
Calcium: 8.8 mg/dL (ref 8.6–10.2)
Chloride: 100 mmol/L (ref 96–106)
Creatinine, Ser: 1.55 mg/dL — ABNORMAL HIGH (ref 0.76–1.27)
GFR calc Af Amer: 51 mL/min/{1.73_m2} — ABNORMAL LOW (ref >59)
GFR calc non Af Amer: 44 mL/min/{1.73_m2} — ABNORMAL LOW (ref >59)
Glucose: 82 mg/dL (ref 65–99)
Potassium: 4.8 mmol/L (ref 3.5–5.2)
Sodium: 139 mmol/L (ref 134–144)

## 2020-03-26 ENCOUNTER — Encounter: Payer: Self-pay | Admitting: Family Medicine

## 2020-03-26 ENCOUNTER — Other Ambulatory Visit: Payer: Self-pay

## 2020-03-26 ENCOUNTER — Ambulatory Visit (INDEPENDENT_AMBULATORY_CARE_PROVIDER_SITE_OTHER): Payer: PPO | Admitting: Family Medicine

## 2020-03-26 VITALS — BP 134/56 | HR 96 | Temp 96.9°F | Wt 175.6 lb

## 2020-03-26 DIAGNOSIS — J42 Unspecified chronic bronchitis: Secondary | ICD-10-CM | POA: Diagnosis not present

## 2020-03-26 DIAGNOSIS — I10 Essential (primary) hypertension: Secondary | ICD-10-CM

## 2020-03-26 MED ORDER — TRELEGY ELLIPTA 100-62.5-25 MCG/INH IN AEPB: INHALATION_SPRAY | RESPIRATORY_TRACT | 12 refills | Status: DC

## 2020-03-26 MED ORDER — PROAIR HFA 108 (90 BASE) MCG/ACT IN AERS: INHALATION_SPRAY | RESPIRATORY_TRACT | 5 refills | Status: DC

## 2020-03-26 NOTE — Patient Instructions (Signed)
Please send to [facility] your prescription for Trelegy  Please start using this in place of the Symbicort  Should you feel you are having progressive troubles with your lungs please let me know  We will see you again in 4 months time  TakeCare-Blake Cherry

## 2020-03-26 NOTE — Progress Notes (Signed)
   Subjective:    Patient ID: Blake Cherry, male    DOB: 03-24-1945, 75 y.o.   MRN: [id number]  Hypertension This is a chronic problem. Associated symptoms include shortness of breath. Pertinent negatives include no chest pain or headaches. There are no compliance problems.   Pt tries to check blood pressure each day. Has had high readings and low readings.   Trelegy- prescribed by Dr.Hawkins-recently began to take it again and it has helped. Would like new script if PCP allows.  Patient has underlying COPD.  He had some trouble when he tries to move around.  O2 saturations today 93% No significant setbacks recently    Review of Systems  Constitutional: Negative for activity change.  HENT: Negative for congestion and rhinorrhea.   Respiratory: Positive for shortness of breath. Negative for cough.   Cardiovascular: Negative for chest pain.  Gastrointestinal: Negative for abdominal pain, diarrhea, nausea and vomiting.  Genitourinary: Negative for dysuria and hematuria.  Neurological: Negative for weakness and headaches.  Psychiatric/Behavioral: Negative for behavioral problems and confusion.       Objective:   Physical Exam Vitals reviewed.  Constitutional:      General: He is not in acute distress.    Appearance: He is well-nourished.  HENT:     Head: Normocephalic and atraumatic.  Eyes:     General:        Right eye: No discharge.        Left eye: No discharge.  Neck:     Trachea: No tracheal deviation.  Cardiovascular:     Rate and Rhythm: Normal rate.     Heart sounds: Normal heart sounds. No murmur heard.   Pulmonary:     Effort: Pulmonary effort is normal. No respiratory distress.     Breath sounds: Normal breath sounds.  Musculoskeletal:        General: No edema.  Lymphadenopathy:     Cervical: No cervical adenopathy.  Skin:    General: Skin is warm and dry.  Neurological:     Mental Status: He is alert.     Coordination: Coordination normal.   Psychiatric:        Mood and Affect: Mood and affect normal.        Behavior: Behavior normal.    Heart rate controlled currently Pedal edema noted 1-2+        Assessment & Plan:  COPD-we will utilize Trelegy he is used this in the past stated it worked really well for him stop the Symbicort albuterol as needed no need for any steroids currently on oral antibiotics  In the past he was under the care of Dr. Luan Pulling.  I told Blake Cherry that I am not opposed to him seeing a pulmonologist but currently right now it does seem that he is getting his needs met via the care we are giving I would like to see him back in 4 months  Atrial fibrillation with rapid ventricular response heart rate today is in the low 90s blood pressure slightly low diastolic but systolic looks reasonable  He does have some swelling in the lower legs but we will leave this to cardiology if they desire to bump up his diuretic some of this could be side effects from the diltiazem with his kidney function where it is currently I do not recommend increasing the dose

## 2020-03-27 ENCOUNTER — Ambulatory Visit (HOSPITAL_COMMUNITY)
Admission: RE | Admit: 2020-03-27 | Discharge: 2020-03-27 | Disposition: A | Discharge status: Home / Self Care | Payer: PPO | Source: Ambulatory Visit | Attending: Nurse Practitioner | Admitting: Nurse Practitioner

## 2020-03-27 ENCOUNTER — Encounter (HOSPITAL_COMMUNITY): Payer: Self-pay | Admitting: Nurse Practitioner

## 2020-03-27 VITALS — BP 110/60 | HR 77 | Ht 67.0 in | Wt 172.8 lb

## 2020-03-27 DIAGNOSIS — D6869 Other thrombophilia: Secondary | ICD-10-CM

## 2020-03-27 DIAGNOSIS — Z7901 Long term (current) use of anticoagulants: Secondary | ICD-10-CM | POA: Diagnosis not present

## 2020-03-27 DIAGNOSIS — Z79899 Other long term (current) drug therapy: Secondary | ICD-10-CM | POA: Insufficient documentation

## 2020-03-27 DIAGNOSIS — I4891 Unspecified atrial fibrillation: Secondary | ICD-10-CM | POA: Insufficient documentation

## 2020-03-27 DIAGNOSIS — F17210 Nicotine dependence, cigarettes, uncomplicated: Secondary | ICD-10-CM | POA: Insufficient documentation

## 2020-03-27 DIAGNOSIS — I4892 Unspecified atrial flutter: Secondary | ICD-10-CM | POA: Insufficient documentation

## 2020-03-27 DIAGNOSIS — Z8249 Family history of ischemic heart disease and other diseases of the circulatory system: Secondary | ICD-10-CM | POA: Insufficient documentation

## 2020-03-27 NOTE — Progress Notes (Signed)
Primary Care Physician: [name], MD Referring Physician: Dayton Scrape, PA Cardiologist: Dr. Bedelia Person Prout is a 75 y.o. male with a h/o ongoing tobacco abuse, COPD,HTN, AAA s/p repair, carotid artery disease s/p L CEA,CKD, paroxysmal SVT, prior CVA,   new onset atrial flutter,  when seen by [name], PA, 02/08/20, for one year f/u. [name] was asymptomatic. Eliquis 5 mg bid was added for CHA2DS2VASc score of 5. Cardizem was increased. Echo was scheduled but it was cancelled when he showed up with RVR.at 140 bpm.  He also was on antibiotics and prednisone at the time for a COPD exacerbation. Again he was tolerating well.  He  then had f/u with Corine Shelter, PA, 02/27/20 for the RVR. He was started on digoxin and referred here.  Ekg today shows afib at 105 bpm. He has been on anticoagulation long enough to be considered for DCCV, but he has missed several doses. He is still tolerating well. He is over his COPD exacerbation. He has lasix  that he takes  for a few days if he notices his weight has increased. His  weight has been stable.   F/u in the afib clinic, 03/27/20. Pt has spontaneously returned to SR. He has felt better recently and SR was confirmed at PCP's office yesterday. He has noted some LLE > than RLE, this has been noted by his kidney MD in the past and pt takes prn lasix for it. His echo was not able to be done in [place] for RVR so will get this rescheduled and he wants to establish with Dr. Diona Browner in Dr. Junius Argyle absence, to be seen in the [place] office. Dr. Diona Browner sees pt's wife as well.   Today, he denies symptoms of palpitations, chest pain, shortness of breath, orthopnea, PND, lower extremity edema, dizziness, presyncope, syncope, or neurologic sequela. The patient is tolerating medications without difficulties and is otherwise without complaint today.   Past Medical History:  Diagnosis Date  . AAA (abdominal aortic aneurysm) (HCC)   . Carotid artery  occlusion 2007   Left cea  . CKD (chronic kidney disease), stage III (HCC)   . COPD (chronic obstructive pulmonary disease) (HCC)   . Diverticulitis    last time 4 years ago  . Hypertension   . Shortness of breath    with exertion  . Stroke [facility]) 05/2005    2 in 2007.  Weakness and numbness in left arm and leg  . SVT (supraventricular tachycardia) (HCC)    Past Surgical History:  Procedure Laterality Date  . ABDOMINAL AORTAGRAM N/A 03/10/2013   Procedure: ABDOMINAL Ronny Flurry;  Surgeon: Sherren Kerns, MD;  Location: [facility] CATH LAB;  Service: Cardiovascular;  Laterality: N/A;  . ABDOMINAL AORTIC ENDOVASCULAR STENT GRAFT N/A 08/22/2012   Procedure: ABDOMINAL AORTIC ENDOVASCULAR STENT GRAFT;  Surgeon: Sherren Kerns, MD;  Location: [facility] OR;  Service: Vascular;  Laterality: N/A;  . CAROTID ENDARTERECTOMY Left 2007  . CAROTID STENT  2007   left internal carotid artery  . TESTICLE TORSION REDUCTION     age of 37    Current Outpatient Medications  Medication Sig Dispense Refill  . albuterol (PROVENTIL) (2.5 MG/3ML) 0.083% nebulizer solution USE 1 VIAL IN NEBULIZER EVERY 4 HOURS AS NEEDED FOR WHEEZING OR SHORTNESS OF BREATH 450 mL 5  . apixaban (ELIQUIS) 5 MG TABS tablet Take 1 tablet (5 mg total) by mouth 2 (two) times daily. 60 tablet 11  . budesonide (PULMICORT) 0.5  MG/2ML nebulizer solution USE 1 VIAL IN NEBULIZER TWICE DAILY 120 mL 0  . digoxin (LANOXIN) 0.125 MG tablet Take 1 tablet (0.125 mg total) by mouth daily. 90 tablet 3  . diltiazem (CARDIZEM CD) 300 MG 24 hr capsule Take 1 capsule (300 mg total) by mouth daily. 90 capsule 3  . Fluticasone-Umeclidin-Vilant (TRELEGY ELLIPTA) 100-62.5-25 MCG/INH AEPB 1 inhalation daily 1 each 12  . furosemide (LASIX) 20 MG tablet Take by mouth.    Marland Kitchen guaiFENesin (MUCINEX) 600 MG 12 hr tablet Take 1 tablet (600 mg total) by mouth 2 (two) times daily. (Patient taking differently: Take 600 mg by mouth as needed.) 20 tablet 0  . mometasone (ELOCON) 0.1  % cream Apply BID PRN 45 g 1  . PROAIR HFA 108 (90 Base) MCG/ACT inhaler INHALE 2 PUFFS BY MOUTH EVERY 6 HOURS AS NEEDED FOR WHEEZING FOR SHORTNESS OF BREATH 9 g 5   No current facility-administered medications for this encounter.    No Known Allergies  Social History   Socioeconomic History  . Marital status: Married    Spouse name: Not on file  . Number of children: Not on file  . Years of education: Not on file  . Highest education level: Not on file  Occupational History  . Not on file  Tobacco Use  . Smoking status: Current Every Day Smoker    Packs/day: 0.25    Years: 50.00    Pack years: 12.50    Types: Cigarettes    Last attempt to quit: 10/01/2017    Years since quitting: 2.4  . Smokeless tobacco: Former Neurosurgeon    Types: Snuff  . Tobacco comment: 4-5 cigarettes daily  Vaping Use  . Vaping Use: Never used  Substance and Sexual Activity  . Alcohol use: No    Alcohol/week: 0.0 standard drinks  . Drug use: No  . Sexual activity: Not on file  Other Topics Concern  . Not on file  Social History Narrative  . Not on file   Social Determinants of Health   Financial Resource Strain: Not on file  Food Insecurity: Not on file  Transportation Needs: Not on file  Physical Activity: Not on file  Stress: Not on file  Social Connections: Not on file  Intimate Partner Violence: Not on file    Family History  Problem Relation Age of Onset  . Cancer Mother        Breast cancer  . Hypertension Mother   . Diabetes Mother   . Diabetes Father     ROS- All systems are reviewed and negative except as per the HPI above  Physical Exam: Vitals:   03/27/20 1358  BP: 110/60  Pulse: 77  Weight: 78.4 kg  Height: 5\' 7"  (1.702 m)   Wt Readings from Last 3 Encounters:  03/27/20 78.4 kg  03/26/20 79.7 kg  03/12/20 79.7 kg    Labs: Lab Results  Component Value Date   NA 139 03/21/2020   K 4.8 03/21/2020   CL 100 03/21/2020   CO2 22 03/21/2020   GLUCOSE 82 03/21/2020    BUN 22 03/21/2020   CREATININE 1.55 (H) 03/21/2020   CALCIUM 8.8 03/21/2020   MG 2.0 04/29/2018   Lab Results  Component Value Date   INR 1.02 08/22/2012   Lab Results  Component Value Date   CHOL 138 02/08/2020   HDL 48 02/08/2020   LDLCALC 77 02/08/2020   TRIG 64 02/08/2020     GEN- The patient is well  appearing, alert and oriented x 3 today.   Head- normocephalic, atraumatic Eyes-  Sclera clear, conjunctiva pink Ears- hearing intact Oropharynx- clear Neck- supple, no JVP Lymph- no cervical lymphadenopathy Lungs- Clear to ausculation bilaterally, normal work of breathing Heart- Regular rate and rhythm, no murmurs, rubs or gallops, PMI not laterally displaced GI- soft, NT, ND, + BS Extremities- no clubbing, cyanosis, or edema MS- no significant deformity or atrophy Skin- no rash or lesion Psych- euthymic mood, full affect Neuro- strength and sensation are intact  EKG- Normal SInus rhythm at 77 bpm,  pr int 146 ms, qrs itn 78 ms, qtc 400 ms    Assessment and Plan: 1. New onset  Afib/flutter  12/18 He was preparing for cardioversion but has spontaneously  converted He feels improved  He has cut back on caffeine  Continue eliquis 5 mg bid with a CHA2DS2VASc of 5 Continue Cardizem/digoxin at current doses  2. CHA2DS2VASc score of 5 Continue eliquis 5 mg bid  Echo will be rescheduled at [facility] and will request to get established with Dr. Lilla Shook [facility] [address] [place], [place] [address] [phone]

## 2020-04-01 NOTE — Progress Notes (Signed)
Cardiology Office Note  Date: 04/04/2020   ID: Blake Cherry, DOB [birth year], MRN [id number]  PCP:  [name], MD  Cardiologist:  [name], MD Electrophysiologist:  None   Chief Complaint: Follow-up echocardiogram  History of Present Illness: Blake Cherry is a 75 y.o. male with a history of atrial flutter/fibrillation, AAA status post repair, carotid artery disease, CKD 3B, COPD with ongoing tobacco abuse, HTN, shortness of breath, CVA, SVT.  Last encounter with Rudi Coco nurse practitioner at atrial fibrillation clinic on 03/27/2020 CHA2DS2-VASc score 5.  Recent new onset atrial flutter and was seen by Lucile Crater, PA 02/08/2020 for 1 year follow-up.  He was asymptomatic.  Eliquis 5 mg p.o. twice daily was added.  Cardizem dosage was increased.  At clinic patient was noted to have spontaneously returned to sinus rhythm.  His sinus rhythm was confirmed by PCP the prior day.  At visit he requested to establish with Dr. Diona Browner.  At visit with Rudi Coco he denied any symptoms of palpitations, chest pain, shortness of breath, orthopnea, PND, lower extremity edema, dizziness, presyncope, syncope.  Tolerating medications well without difficulties.  Echo was rescheduled to 04/02/2020 at 10:30 AM due to patient being previously in atrial fibrillation with RVR when arriving for previous echo study scheduled.  He is here for follow-up for recent echocardiogram.  He denies any particular complaints.  States he has some mild pitting lower extremity edema.  He states his daily Lasix was changed to as needed by Dr. Allena Katz recently.  His weight has decreased since last check.  He weighs 170 today.  Previous weight on 03/26/2020 he weighed 175.  He denies any recent palpitations or arrhythmias.  He states recent visit at atrial fibrillation clinic he was in normal sinus rhythm.  He denies any orthostatic symptoms, current palpitations, CVA or TIA-like symptoms, bleeding issues, PND,  orthopnea, weight gain.  Past Medical History:  Diagnosis Date   AAA (abdominal aortic aneurysm) (HCC)    Carotid artery occlusion 2007   Left cea   CKD (chronic kidney disease), stage III (HCC)    COPD (chronic obstructive pulmonary disease) (HCC)    Diverticulitis    last time 4 years ago   Hypertension    Shortness of breath    with exertion   Stroke (HCC) 05/2005    2 in 2007.  Weakness and numbness in left arm and leg   SVT (supraventricular tachycardia) (HCC)     Past Surgical History:  Procedure Laterality Date   ABDOMINAL AORTAGRAM N/A 03/10/2013   Procedure: ABDOMINAL Ronny Flurry;  Surgeon: Sherren Kerns, MD;  Location: [facility] CATH LAB;  Service: Cardiovascular;  Laterality: N/A;   ABDOMINAL AORTIC ENDOVASCULAR STENT GRAFT N/A 08/22/2012   Procedure: ABDOMINAL AORTIC ENDOVASCULAR STENT GRAFT;  Surgeon: Sherren Kerns, MD;  Location: [facility] OR;  Service: Vascular;  Laterality: N/A;   CAROTID ENDARTERECTOMY Left 2007   CAROTID STENT  2007   left internal carotid artery   TESTICLE TORSION REDUCTION     age of 52    Current Outpatient Medications  Medication Sig Dispense Refill   albuterol (PROVENTIL) (2.5 MG/3ML) 0.083% nebulizer solution USE 1 VIAL IN NEBULIZER EVERY 4 HOURS AS NEEDED FOR WHEEZING OR SHORTNESS OF BREATH 450 mL 5   apixaban (ELIQUIS) 5 MG TABS tablet Take 1 tablet (5 mg total) by mouth 2 (two) times daily. 60 tablet 11   budesonide (PULMICORT) 0.5 MG/2ML nebulizer solution USE 1 VIAL IN NEBULIZER TWICE DAILY  120 mL 0   digoxin (LANOXIN) 0.125 MG tablet Take 1 tablet (0.125 mg total) by mouth daily. 90 tablet 3   diltiazem (CARDIZEM CD) 300 MG 24 hr capsule Take 1 capsule (300 mg total) by mouth daily. 90 capsule 3   Fluticasone-Umeclidin-Vilant (TRELEGY ELLIPTA) 100-62.5-25 MCG/INH AEPB 1 inhalation daily 1 each 12   furosemide (LASIX) 20 MG tablet Take by mouth.     guaiFENesin (MUCINEX) 600 MG 12 hr tablet Take 1 tablet (600 mg total)  by mouth 2 (two) times daily. (Patient taking differently: Take 600 mg by mouth as needed.) 20 tablet 0   mometasone (ELOCON) 0.1 % cream Apply BID PRN 45 g 1   PROAIR HFA 108 (90 Base) MCG/ACT inhaler INHALE 2 PUFFS BY MOUTH EVERY 6 HOURS AS NEEDED FOR WHEEZING FOR SHORTNESS OF BREATH 9 g 5   No current facility-administered medications for this visit.   Allergies:  Patient has no known allergies.   Social History: The patient  reports that he has been smoking cigarettes. He has a 12.50 pack-year smoking history. He has quit using smokeless tobacco.  His smokeless tobacco use included snuff. He reports that he does not drink alcohol and does not use drugs.   Family History: The patient's family history includes Cancer in his mother; Diabetes in his father and mother; Hypertension in his mother.   ROS:  Please see the history of present illness. Otherwise, complete review of systems is positive for none.  All other systems are reviewed and negative.   Physical Exam: VS:  BP (!) 122/58    Pulse 72    Ht 5\' 7"  (1.702 m)    Wt 170 lb 12.8 oz (77.5 kg)    SpO2 95%    BMI 26.75 kg/m , BMI Body mass index is 26.75 kg/m.  Wt Readings from Last 3 Encounters:  04/04/20 170 lb 12.8 oz (77.5 kg)  03/27/20 172 lb 12.8 oz (78.4 kg)  03/26/20 175 lb 9.6 oz (79.7 kg)    General: Patient appears comfortable at rest. Neck: Supple, no elevated JVP or carotid bruits, no thyromegaly. Lungs: Clear to auscultation, nonlabored breathing at rest. Cardiac: Regular rate and rhythm, no S3 or significant systolic murmur, no pericardial rub. Extremities: Mild pitting edema, distal pulses 2+. Skin: Warm and dry. Musculoskeletal: No kyphosis. Neuropsychiatric: Alert and oriented x3, affect grossly appropriate.  ECG:  March 27, 2020 EKG normal sinus rhythm rate of 77.  Recent Labwork: 02/08/2020: ALT 10; AST 17; Hemoglobin 14.1; Platelets 300; TSH 3.400 03/21/2020: BUN 22; Creatinine, Ser 1.55; Potassium  4.8; Sodium 139     Component Value Date/Time   CHOL 138 02/08/2020 1456   CHOL 140 01/12/2019 0859   TRIG 64 02/08/2020 1456   HDL 48 02/08/2020 1456   HDL 50 01/12/2019 0859   CHOLHDL 2.9 02/08/2020 1456   VLDL 13 02/08/2020 1456   LDLCALC 77 02/08/2020 1456   LDLCALC 74 01/12/2019 0859    Other Studies Reviewed Today:   Echocardiogram 04/02/2020  1. Left ventricular ejection fraction, by estimation, is 60 to 65%. The left ventricle has normal function. The left ventricle has no regional wall motion abnormalities. Left ventricular diastolic parameters are indeterminate. 2. Right ventricular systolic function is normal. The right ventricular size is normal. 3. Left atrial size was moderately dilated. 4. The mitral valve is normal in structure. Trivial mitral valve regurgitation. No evidence of mitral stenosis. 5. The aortic valve is tricuspid. Aortic valve regurgitation is not visualized. No  aortic stenosis is present. 6. The inferior vena cava is normal in size with <50% respiratory variability, suggesting right atrial pressure of 8 mmHg.  Echo 2016 Study Conclusions  - Left ventricle: The cavity size was normal. Wall thickness was  increased in a pattern of mild LVH. Systolic function was normal.  The estimated ejection fraction was in the range of 55% to 60%.  Indeterminant diastolic dysfunction. Wall motion was normal;  there were no regional wall motion abnormalities.  - Aortic valve: Mildly calcified annulus. Mildly thickened  leaflets. Valve area (VTI): 3.31 cm^2. Valve area (Vmax): 3.16  cm^2.  - Mitral valve: Mildly calcified annulus. Mildly thickened leaflets  .  - Atrial septum: There was increased thickness of the septum,  consistent with lipomatous hypertrophy. No defect or patent  foramen ovale was identified.  - Technically difficult study.   Transthoracic echocardiography. M-mode, complete 2D, spectral  Doppler, and color Doppler.  Birthdate: Patient birthdate:  May 27, 1945. Age: Patient is 75 yr old. Sex: Gender: male.  BMI: 31 kg/m^2. Blood pressure:   124/71 Patient status:  Inpatient. Study date: Study date: 09/17/2014. Study time: 04:28  PM. Location: Bedside.    Assessment and Plan:  1. Atrial flutter, unspecified type (HCC)   2. Chronic obstructive pulmonary disease, unspecified COPD type (HCC)   3. Stage 3 chronic kidney disease, unspecified whether stage 3a or 3b CKD (HCC)   4. Essential hypertension, benign    1. Atrial flutter, unspecified type [facility]) Recent visit with atrial fibrillation clinic.  His EKG showed he was in normal sinus rhythm.  Today his heart rate is regular with a rate of 72.  Continue diltiazem 300 mg daily.  Continue Eliquis 5 mg p.o. twice daily.  Recent follow-up echo demonstrated EF 60 to 65%, no WMA's, indeterminate diastolic function, trivial MR, moderately dilated LA  2. Chronic obstructive pulmonary disease, unspecified COPD type (HCC) Has a chronic loose cough.  Denies any current SOB or DOE.   3. Stage 3 chronic kidney disease, unspecified whether stage 3a or 3b CKD (HCC) Recent creatinine 1.55 and GFR 44.  Sees Dr. Allena Katz nephrology  4. Essential hypertension, benign Pressure well controlled today at 122/58.  Continue furosemide 20 mg as needed.  Patient states he was taking Lasix daily but nephrologist recently changed it to as needed.  Continue diltiazem 300 mg p.o. daily.  Medication Adjustments/Labs and Tests Ordered: Current medicines are reviewed at length with the patient today.  Concerns regarding medicines are outlined above.   Disposition: Follow-up with Dr. Diona Browner or APP 6 months  Signed, Rennis Harding, NP 04/04/2020 9:35 AM    [facility] [address] [place], [place], [place] [address] Phone: [phone]; Fax: [phone]

## 2020-04-02 ENCOUNTER — Other Ambulatory Visit: Payer: Self-pay

## 2020-04-02 ENCOUNTER — Ambulatory Visit (HOSPITAL_COMMUNITY)
Admission: RE | Admit: 2020-04-02 | Discharge: 2020-04-02 | Disposition: A | Discharge status: Home / Self Care | Payer: PPO | Source: Ambulatory Visit | Attending: Nurse Practitioner | Admitting: Nurse Practitioner

## 2020-04-02 DIAGNOSIS — I4892 Unspecified atrial flutter: Secondary | ICD-10-CM | POA: Diagnosis not present

## 2020-04-02 LAB — ECHOCARDIOGRAM COMPLETE
AR max vel: 2.77 cm2
AV Area VTI: 3.13 cm2
AV Area mean vel: 2.91 cm2
AV Mean grad: 5.1 mmHg
AV Peak grad: 9.4 mmHg
Ao pk vel: 1.53 m/s
Area-P 1/2: 4.33 cm2
S' Lateral: 2.9 cm

## 2020-04-02 NOTE — Progress Notes (Signed)
*  PRELIMINARY RESULTS* Echocardiogram 2D Echocardiogram has been performed.  Stacey Drain 04/02/2020, 11:20 AM

## 2020-04-03 ENCOUNTER — Encounter (HOSPITAL_COMMUNITY): Payer: Self-pay | Admitting: *Deleted

## 2020-04-04 ENCOUNTER — Other Ambulatory Visit: Payer: Self-pay

## 2020-04-04 ENCOUNTER — Ambulatory Visit: Payer: PPO | Admitting: Family Medicine

## 2020-04-04 ENCOUNTER — Encounter: Payer: Self-pay | Admitting: Family Medicine

## 2020-04-04 VITALS — BP 122/58 | HR 72 | Ht 67.0 in | Wt 170.8 lb

## 2020-04-04 DIAGNOSIS — I10 Essential (primary) hypertension: Secondary | ICD-10-CM | POA: Diagnosis not present

## 2020-04-04 DIAGNOSIS — I4892 Unspecified atrial flutter: Secondary | ICD-10-CM | POA: Diagnosis not present

## 2020-04-04 DIAGNOSIS — N183 Chronic kidney disease, stage 3 unspecified: Secondary | ICD-10-CM | POA: Diagnosis not present

## 2020-04-04 DIAGNOSIS — J449 Chronic obstructive pulmonary disease, unspecified: Secondary | ICD-10-CM

## 2020-04-04 MED ORDER — DILTIAZEM HCL ER COATED BEADS 300 MG PO CP24: 300.0000 mg | ORAL_CAPSULE | Freq: Every day | ORAL | 3 refills | Status: DC

## 2020-04-04 NOTE — Progress Notes (Signed)
Medication refill request approved.

## 2020-04-04 NOTE — Patient Instructions (Signed)
Medication Instructions:  Continue all current medications.   Labwork: none  Testing/Procedures: none  Follow-Up: 6 months   Any Other Special Instructions Will Be Listed Below (If Applicable).   If you need a refill on your cardiac medications before your next appointment, please call your pharmacy.  

## 2020-04-23 ENCOUNTER — Other Ambulatory Visit: Payer: Self-pay | Admitting: Family Medicine

## 2020-04-23 NOTE — Telephone Encounter (Signed)
04/04/20

## 2020-06-13 ENCOUNTER — Telehealth: Payer: Self-pay

## 2020-06-13 ENCOUNTER — Ambulatory Visit (HOSPITAL_COMMUNITY): Admission: RE | Admit: 2020-06-13 | Payer: PPO | Source: Ambulatory Visit

## 2020-06-13 MED ORDER — DOXYCYCLINE HYCLATE 100 MG PO TABS: ORAL_TABLET | ORAL | 0 refills | Status: DC

## 2020-06-13 NOTE — Telephone Encounter (Signed)
May call in doxycycline 100 mg twice daily for 10 days Please asked patient if he can check his O2 saturations and let us know what they are May need to work him into the schedule on Friday thank you

## 2020-06-13 NOTE — Telephone Encounter (Signed)
Pt contacted. Pt states he is on board with coming in at 11:40 am to have lungs checked. Pt placed on schedule.

## 2020-06-13 NOTE — Telephone Encounter (Signed)
I am glad his oxygen is 92%, please let patient know that I could work him in tomorrow at 1140 to listen to his lungs to make sure we do not hear pneumonia-if he is on board with that then Please go ahead and schedule (unfortunately rest of schedule very booked)

## 2020-06-13 NOTE — Telephone Encounter (Signed)
Pt contacted. Pt is having a lot of congestion, cough and short of breath. Started last week but yesterday did get worse. Pt is using inhalers ProAir, Budesonide, Symbicort, Albuterol. Please advise. Thank you  *informed pt that with short of breath we would recommend urgent care*

## 2020-06-13 NOTE — Telephone Encounter (Signed)
Pt is wanting to know if he can get a RX of Doxycycline he is having another flair up of COPD [facility] [place]   Pt call back [phone]

## 2020-06-13 NOTE — Telephone Encounter (Signed)
Please advise. Thank you

## 2020-06-13 NOTE — Telephone Encounter (Signed)
Pt contacted. Antibiotic sent to pharmacy. Pt O2 is 92%. Please advise. Thank you

## 2020-06-14 ENCOUNTER — Ambulatory Visit (INDEPENDENT_AMBULATORY_CARE_PROVIDER_SITE_OTHER): Payer: PPO | Admitting: Family Medicine

## 2020-06-14 ENCOUNTER — Other Ambulatory Visit: Payer: Self-pay

## 2020-06-14 VITALS — BP 104/58 | HR 61 | Temp 97.5°F | Ht 67.0 in | Wt 166.8 lb

## 2020-06-14 DIAGNOSIS — J441 Chronic obstructive pulmonary disease with (acute) exacerbation: Secondary | ICD-10-CM | POA: Diagnosis not present

## 2020-06-14 MED ORDER — PREDNISONE 20 MG PO TABS: ORAL_TABLET | ORAL | 0 refills | Status: DC

## 2020-06-14 NOTE — Progress Notes (Signed)
   Subjective:    Patient ID: Blake Cherry, male    DOB: [birth year], 75 y.o.   MRN: [id number]  HPI Follow up copd Very nice patient Has underlying COPD Having significant trouble coughing congestion shortness of breath O2 sats are staying in the mid 90s Using his albuterol   Review of Systems See above O2 sats good on current visit    Objective:   Physical Exam Vitals reviewed.  Constitutional:      General: He is not in acute distress. HENT:     Head: Normocephalic and atraumatic.  Eyes:     General:        Right eye: No discharge.        Left eye: No discharge.  Neck:     Trachea: No tracheal deviation.  Cardiovascular:     Rate and Rhythm: Normal rate and regular rhythm.     Heart sounds: Normal heart sounds. No murmur heard.   Pulmonary:     Effort: Pulmonary effort is normal. No respiratory distress.     Breath sounds: Normal breath sounds.  Lymphadenopathy:     Cervical: No cervical adenopathy.  Skin:    General: Skin is warm and dry.  Neurological:     Mental Status: He is alert.     Coordination: Coordination normal.  Psychiatric:        Behavior: Behavior normal.           Assessment & Plan:  COPD exacerbation Antibiotic and prednisone Warning signs discussed Follow-up ongoing trouble COVID testing recommended

## 2020-06-16 LAB — NOVEL CORONAVIRUS, NAA: SARS-CoV-2, NAA: NOT DETECTED

## 2020-06-16 LAB — SPECIMEN STATUS REPORT

## 2020-06-16 LAB — SARS-COV-2, NAA 2 DAY TAT

## 2020-06-24 DIAGNOSIS — N1832 Chronic kidney disease, stage 3b: Secondary | ICD-10-CM | POA: Diagnosis not present

## 2020-06-24 DIAGNOSIS — I129 Hypertensive chronic kidney disease with stage 1 through stage 4 chronic kidney disease, or unspecified chronic kidney disease: Secondary | ICD-10-CM | POA: Diagnosis not present

## 2020-06-24 DIAGNOSIS — D631 Anemia in chronic kidney disease: Secondary | ICD-10-CM | POA: Diagnosis not present

## 2020-06-24 DIAGNOSIS — N2581 Secondary hyperparathyroidism of renal origin: Secondary | ICD-10-CM | POA: Diagnosis not present

## 2020-07-09 ENCOUNTER — Other Ambulatory Visit: Payer: Self-pay

## 2020-07-09 ENCOUNTER — Ambulatory Visit (HOSPITAL_COMMUNITY)
Admission: RE | Admit: 2020-07-09 | Discharge: 2020-07-09 | Disposition: A | Discharge status: Home / Self Care | Payer: PPO | Source: Ambulatory Visit | Attending: Acute Care | Admitting: Acute Care

## 2020-07-09 DIAGNOSIS — J438 Other emphysema: Secondary | ICD-10-CM | POA: Diagnosis not present

## 2020-07-09 DIAGNOSIS — Z87891 Personal history of nicotine dependence: Secondary | ICD-10-CM | POA: Diagnosis not present

## 2020-07-09 DIAGNOSIS — I358 Other nonrheumatic aortic valve disorders: Secondary | ICD-10-CM | POA: Diagnosis not present

## 2020-07-09 DIAGNOSIS — F17210 Nicotine dependence, cigarettes, uncomplicated: Secondary | ICD-10-CM | POA: Diagnosis not present

## 2020-07-09 DIAGNOSIS — J432 Centrilobular emphysema: Secondary | ICD-10-CM | POA: Diagnosis not present

## 2020-07-09 DIAGNOSIS — I2510 Atherosclerotic heart disease of native coronary artery without angina pectoris: Secondary | ICD-10-CM | POA: Diagnosis not present

## 2020-07-12 ENCOUNTER — Encounter: Payer: Self-pay | Admitting: Internal Medicine

## 2020-07-12 DIAGNOSIS — R918 Other nonspecific abnormal finding of lung field: Secondary | ICD-10-CM | POA: Insufficient documentation

## 2020-07-15 NOTE — Progress Notes (Signed)
Subjective:   Blake Cherry is a 75 y.o. male who presents for Medicare Annual/Subsequent preventive examination.  I connected with Blake Cherry today by telephone and verified that I am speaking with the correct person using two identifiers. Location patient: home Location provider: work Persons participating in the virtual visit: patient, provider.   I discussed the limitations, risks, security and privacy concerns of performing an evaluation and management service by telephone and the availability of in person appointments. I also discussed with the patient that there may be a patient responsible charge related to this service. The patient expressed understanding and verbally consented to this telephonic visit.    Interactive audio and video telecommunications were attempted between this provider and patient, however failed, due to patient having technical difficulties OR patient did not have access to video capability.  We continued and completed visit with audio only.      Review of Systems    N/A  Cardiac Risk Factors include: advanced age (>59men, >62 women);male gender     Objective:    Today's Vitals   There is no height or weight on file to calculate BMI.  Advanced Directives 07/16/2020 04/29/2018 10/03/2017 10/03/2017 12/20/2014 09/16/2014 09/16/2014  Does Patient Have a Medical Advance Directive? Yes Yes Yes Yes Yes Yes No;Yes  Type of Estate agent of [place];Living will Healthcare Power of [place];Living will Healthcare Power of [place];Living will Healthcare Power of [place];Living will - Healthcare Power of [place];Living will -  Does patient want to make changes to medical advance directive? No - Patient declined - No - Patient declined - - No - Patient declined -  Copy of Healthcare Power of Attorney in Chart? No - copy requested No - copy requested Yes - - No - copy requested -  Would patient like information on creating a medical advance  directive? - No - Patient declined - - - - -  Pre-existing out of facility DNR order (yellow form or pink MOST form) - - - - - - -    Current Medications (verified) Outpatient Encounter Medications as of 07/16/2020  Medication Sig  . albuterol (PROVENTIL) (2.5 MG/3ML) 0.083% nebulizer solution USE 1 VIAL IN NEBULIZER EVERY 4 HOURS AS NEEDED FOR WHEEZING OR SHORTNESS OF BREATH  . Budeson-Glycopyrrol-Formoterol (BREZTRI AEROSPHERE) 160-9-4.8 MCG/ACT AERO Inhale 2 puffs into the lungs in the morning and at bedtime.  . digoxin (LANOXIN) 0.125 MG tablet Take 1 tablet (0.125 mg total) by mouth daily.  Marland Kitchen diltiazem (CARDIZEM CD) 300 MG 24 hr capsule Take 1 capsule (300 mg total) by mouth daily.  . furosemide (LASIX) 20 MG tablet Take 20 mg by mouth daily as needed.  Marland Kitchen guaiFENesin (MUCINEX) 600 MG 12 hr tablet Take 1 tablet (600 mg total) by mouth 2 (two) times daily. (Patient taking differently: Take 600 mg by mouth as needed.)  . mometasone (ELOCON) 0.1 % cream Apply BID PRN  . PROAIR HFA 108 (90 Base) MCG/ACT inhaler INHALE 2 PUFFS BY MOUTH EVERY 6 HOURS AS NEEDED FOR WHEEZING FOR SHORTNESS OF BREATH  . [DISCONTINUED] apixaban (ELIQUIS) 5 MG TABS tablet Take 1 tablet (5 mg total) by mouth 2 (two) times daily.  . [DISCONTINUED] budesonide (PULMICORT) 0.5 MG/2ML nebulizer solution USE 1 VIAL IN NEBULIZER TWICE DAILY  . [DISCONTINUED] doxycycline (VIBRA-TABS) 100 MG tablet Take one tablet po BID for 10 days  . [DISCONTINUED] Fluticasone-Umeclidin-Vilant (TRELEGY ELLIPTA) 100-62.5-25 MCG/INH AEPB 1 inhalation daily  . [DISCONTINUED] predniSONE (DELTASONE) 20 MG tablet 3 po  qd x 3 d then 2 po qd x 3 d then 1 po qd x 3 d   No facility-administered encounter medications on file as of 07/16/2020.    Allergies (verified) Patient has no known allergies.   History: Past Medical History:  Diagnosis Date  . AAA (abdominal aortic aneurysm) (HCC)   . Carotid artery occlusion 2007   Left cea  . CKD (chronic  kidney disease), stage III (HCC)   . COPD (chronic obstructive pulmonary disease) (HCC)   . Diverticulitis    last time 4 years ago  . Hypertension   . Shortness of breath    with exertion  . Stroke [facility](HCC) 05/2005    2 in 2007.  Weakness and numbness in left arm and leg  . SVT (supraventricular tachycardia) (HCC)    Past Surgical History:  Procedure Laterality Date  . ABDOMINAL AORTAGRAM N/A 03/10/2013   Procedure: ABDOMINAL Ronny FlurryAORTAGRAM;  Surgeon: Sherren Kernsharles E Fields, MD;  [facility];  Service: Cardiovascular;  Laterality: N/A;  . ABDOMINAL AORTIC ENDOVASCULAR STENT GRAFT N/A 08/22/2012   Procedure: ABDOMINAL AORTIC ENDOVASCULAR STENT GRAFT;  Surgeon: Sherren Kernsharles E Fields, MD;  [facility];  Service: Vascular;  Laterality: N/A;  . CAROTID ENDARTERECTOMY Left 2007  . CAROTID STENT  2007   left internal carotid artery  . TESTICLE TORSION REDUCTION     age of 75   Family History  Problem Relation Age of Onset  . Cancer Mother        Breast cancer  . Hypertension Mother   . Diabetes Mother   . Diabetes Father    Social History   Socioeconomic History  . Marital status: Married    Spouse name: Not on file  . Number of children: Not on file  . Years of education: Not on file  . Highest education level: Not on file  Occupational History  . Not on file  Tobacco Use  . Smoking status: Current Every Day Smoker    Packs/day: 1.00    Years: 60.00    Pack years: 60.00    Types: Cigarettes  . Smokeless tobacco: Former NeurosurgeonUser    Types: Snuff  . Tobacco comment: 4-5 cogs per day 07/16/20//lmr  Vaping Use  . Vaping Use: Never used  Substance and Sexual Activity  . Alcohol use: No    Alcohol/week: 0.0 standard drinks  . Drug use: No  . Sexual activity: Not on file  Other Topics Concern  . Not on file  Social History Narrative  . Not on file   Social Determinants of Health   Financial Resource Strain: Low Risk   . Difficulty of Paying Living Expenses: Not hard at all   Food Insecurity: No Food Insecurity  . Worried About Programme researcher, broadcasting/film/videounning Out of Food in the Last Year: Never true  . Ran Out of Food in the Last Year: Never true  Transportation Needs: No Transportation Needs  . Lack of Transportation (Medical): No  . Lack of Transportation (Non-Medical): No  Physical Activity: Inactive  . Days of Exercise per Week: 0 days  . Minutes of Exercise per Session: 0 min  Stress: No Stress Concern Present  . Feeling of Stress : Not at all  Social Connections: Moderately Integrated  . Frequency of Communication with Friends and Family: More than three times a week  . Frequency of Social Gatherings with Friends and Family: More than three times a week  . Attends Religious Services: More than 4 times per year  .  Active Member of Clubs or Organizations: No  . Attends Banker Meetings: Never  . Marital Status: Married    Tobacco Counseling Ready to quit: Not Answered Counseling given: Not Answered Comment: 4-5 cogs per day 07/16/20//lmr   Clinical Intake:  Pre-visit preparation completed: Yes  Pain : No/denies pain     Nutritional Risks: None Diabetes: No  How often do you need to have someone help you when you read instructions, pamphlets, or other written materials from your doctor or pharmacy?: 1 - Never  Diabetic?No  Interpreter Needed?: No  Information entered by :: SCrews,LPN   Activities of Daily Living In your present state of health, do you have any difficulty performing the following activities: 07/16/2020  Hearing? N  Vision? Y  Difficulty concentrating or making decisions? N  Walking or climbing stairs? N  Dressing or bathing? N  Doing errands, shopping? N  Preparing Food and eating ? N  Using the Toilet? N  In the past six months, have you accidently leaked urine? N  Do you have problems with loss of bowel control? N  Managing your Medications? N  Managing your Finances? N  Housekeeping or managing your Housekeeping? N   Some recent data might be hidden    Patient Care Team: [name], MD as PCP - General (Family Medicine) Jonelle Sidle, MD as PCP - Cardiology (Cardiology)  Indicate any recent Medical Services you may have received from other than [facility] providers in the past year (date may be approximate).     Assessment:   This is a routine wellness examination for Donne.  Hearing/Vision screen  Hearing Screening   125Hz  250Hz  500Hz  1000Hz  2000Hz  3000Hz  4000Hz  6000Hz  8000Hz   Right ear:           Left ear:           Vision Screening Comments: Patient states gets eye exams once per year. Has macular degeneration  Dietary issues and exercise activities discussed: Current Exercise Habits: The patient does not participate in regular exercise at present, Exercise limited by: respiratory conditions(s)  Goals Addressed            This Visit's Progress   . Patient Stated       I would like to maintain my current health      Depression Screen PHQ 2/9 Scores 07/16/2020 06/14/2020 03/26/2020 02/26/2020 11/07/2018 11/03/2017 05/06/2016  PHQ - 2 Score 0 0 0 0 0 0 0  PHQ- 9 Score - - - - - 4 -    Fall Risk Fall Risk  07/16/2020 06/14/2020 03/26/2020 02/26/2020 12/14/2018  Falls in the past year? 0 0 0 0 0  Number falls in past yr: 0 0 0 - -  Injury with Fall? 0 0 0 - -  Risk for fall due to : No Fall Risks - - - -  Follow up Falls evaluation completed;Falls prevention discussed Falls evaluation completed Falls evaluation completed Falls evaluation completed Falls evaluation completed    FALL RISK PREVENTION PERTAINING TO THE HOME:  Any stairs in or around the home? No  If so, are there any without handrails? No  Home free of loose throw rugs in walkways, pet beds, electrical cords, etc? Yes  Adequate lighting in your home to reduce risk of falls? Yes   ASSISTIVE DEVICES UTILIZED TO PREVENT FALLS:  Life alert? No  Use of a cane, walker or w/c? No  Grab bars in the bathroom? Yes  Shower  chair or  bench in shower? Yes  Elevated toilet seat or a handicapped toilet? No     Cognitive Function:   Normal cognitive status assessed by direct observation by this Nurse Health Advisor. No abnormalities found.        Immunizations Immunization History  Administered Date(s) Administered  . Fluad Quad(high Dose 65+) 11/18/2018, 11/14/2019  . Influenza, Seasonal, Injecte, Preservative Fre 11/07/2015  . Influenza,inj,Quad PF,6+ Mos 11/05/2016, 11/03/2017  . Influenza-Unspecified 10/24/2012  . PFIZER(Purple Top)SARS-COV-2 Vaccination 04/16/2019, 05/10/2019, 11/30/2019  . Pneumococcal Conjugate-13 02/13/2014  . Pneumococcal Polysaccharide-23 11/24/2007, 01/11/2013  . Td 12/25/2010  . Zoster Recombinat (Shingrix) 11/18/2018, 01/21/2019    TDAP status: Up to date  Flu Vaccine status: Up to date  Pneumococcal vaccine status: Up to date  Covid-19 vaccine status: Completed vaccines  Qualifies for Shingles Vaccine? Yes   Zostavax completed No   Shingrix Completed?: Yes  Screening Tests Health Maintenance  Topic Date Due  . COLON CANCER SCREENING ANNUAL FOBT  11/23/2020 (Originally 11/20/2017)  . COLONOSCOPY (Pts 45-24yrs Insurance coverage will need to be confirmed)  03/26/2021 (Originally 09/24/2015)  . INFLUENZA VACCINE  09/23/2020  . TETANUS/TDAP  12/24/2020  . COVID-19 Vaccine  Completed  . Hepatitis C Screening  Completed  . PNA vac Low Risk Adult  Completed  . HPV VACCINES  Aged Out    Health Maintenance  There are no preventive care reminders to display for this patient.  Colorectal cancer screening: Type of screening: FOBT/FIT. Completed 11/20/2016. Repeat every 1 years  Lung Cancer Screening: (Low Dose CT Chest recommended if Age 6-80 years, 30 pack-year currently smoking OR have quit w/in 15years.) does not qualify.   Lung Cancer Screening Referral: N/A  Additional Screening:  Hepatitis C Screening: does qualify; Completed 05/24/2017  Vision Screening:  Recommended annual ophthalmology exams for early detection of glaucoma and other disorders of the eye. Is the patient up to date with their annual eye exam?  Yes  Who is the provider or what is the name of the office in which the patient attends annual eye exams? Dr. Nile Riggs If pt is not established with a provider, would they like to be referred to a provider to establish care? No .   Dental Screening: Recommended annual dental exams for proper oral hygiene  Community Resource Referral / Chronic Care Management: CRR required this visit?  No   CCM required this visit?  No      Plan:     I have personally reviewed and noted the following in the patient's chart:   . Medical and social history . Use of alcohol, tobacco or illicit drugs  . Current medications and supplements including opioid prescriptions. Patient is not currently taking opioid prescriptions. . Functional ability and status . Nutritional status . Physical activity . Advanced directives . List of other physicians . Hospitalizations, surgeries, and ER visits in previous 12 months . Vitals . Screenings to include cognitive, depression, and falls . Referrals and appointments  In addition, I have reviewed and discussed with patient certain preventive protocols, quality metrics, and best practice recommendations. A written personalized care plan for preventive services as well as general preventive health recommendations were provided to patient.     Theodora Blow, LPN   [id number]/[id number]   Nurse Notes: Patient would like to have FOBT orderded

## 2020-07-16 ENCOUNTER — Encounter: Payer: Self-pay | Admitting: Internal Medicine

## 2020-07-16 ENCOUNTER — Other Ambulatory Visit: Payer: Self-pay

## 2020-07-16 ENCOUNTER — Ambulatory Visit (INDEPENDENT_AMBULATORY_CARE_PROVIDER_SITE_OTHER): Payer: PPO

## 2020-07-16 ENCOUNTER — Ambulatory Visit: Payer: PPO | Admitting: Internal Medicine

## 2020-07-16 DIAGNOSIS — J449 Chronic obstructive pulmonary disease, unspecified: Secondary | ICD-10-CM

## 2020-07-16 DIAGNOSIS — R918 Other nonspecific abnormal finding of lung field: Secondary | ICD-10-CM

## 2020-07-16 DIAGNOSIS — F17210 Nicotine dependence, cigarettes, uncomplicated: Secondary | ICD-10-CM | POA: Insufficient documentation

## 2020-07-16 DIAGNOSIS — Z0000 Encounter for general adult medical examination without abnormal findings: Secondary | ICD-10-CM | POA: Diagnosis not present

## 2020-07-16 MED ORDER — BREZTRI AEROSPHERE 160-9-4.8 MCG/ACT IN AERO: 2.0000 | INHALATION_SPRAY | Freq: Two times a day (BID) | RESPIRATORY_TRACT | 0 refills | Status: DC

## 2020-07-16 NOTE — Assessment & Plan Note (Signed)
Counseled re importance of smoking cessation but did not meet time criteria for separate billing   °

## 2020-07-16 NOTE — Progress Notes (Addendum)
Blake Cherry, male    DOB: [birth year]   MRN: [id number]   Brief patient profile:  88 yowm active smoker with confirmed  COPD III by pfts 05/07/15  referred to pulmonary clinic 07/16/2020 by Kandice Robinsons for abn LDSC ? 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      History of Present Illness  07/16/2020  Pulmonary/ 1st office eval/Brayen Bunn  Chief Complaint  Patient presents with  . Pulmonary Consult    Referral from Kandice Robinsons, NP- eval of abnormal lung ca screening done on 07/11/20- MPN. Pt states that he used to see Dr Juanetta Gosling for COPD. He feels like his breathing is baseline. He has occ cough with gray sputum.   Dyspnea:  MMRC1 = can walk nl pace, flat grade, can't hurry or go uphills or steps s sob   Cough: am mucus / chronically sltly  greenish never bloody/ no assoc cp  Sleep: on side/ bed is flat  SABA use: neb each am followed by budesonide and  Then symbicort 2 every 12 hours  covid 19 status  vax x 3   No obvious day to day or daytime variability or assoc excess/ purulent sputum or mucus plugs or hemoptysis or cp or chest tightness, subjective wheeze or overt sinus or hb symptoms.   Sleeping  without nocturnal  or early am exacerbation  of respiratory  c/o's or need for noct saba. Also denies any obvious fluctuation of symptoms with weather or environmental changes or other aggravating or alleviating factors except as outlined above   No unusual exposure hx or h/o childhood pna/ asthma or knowledge of premature birth.  Current Allergies, Complete Past Medical History, Past Surgical History, Family History, and Social History were reviewed in [employer] record.  ROS  The following are not active complaints unless bolded Hoarseness, sore throat, dysphagia, dental problems, itching, sneezing,  nasal congestion or discharge of excess mucus or purulent secretions,  ear ache,   fever, chills, sweats, unintended wt loss or wt gain, classically pleuritic or exertional cp,  orthopnea pnd or arm/hand swelling  or leg swelling, presyncope, palpitations, abdominal pain, anorexia, nausea, vomiting, diarrhea  or change in bowel habits or change in bladder habits, change in stools or change in urine, dysuria, hematuria,  rash, arthralgias, visual complaints, headache, numbness, weakness or ataxia or problems with walking or coordination,  change in mood or  memory.            Past Medical History:  Diagnosis Date  . AAA (abdominal aortic aneurysm) (HCC)   . Carotid artery occlusion 2007   Left cea  . CKD (chronic kidney disease), stage III (HCC)   . COPD (chronic obstructive pulmonary disease) (HCC)   . Diverticulitis    last time 4 years ago  . Hypertension   . Shortness of breath    with exertion  . Stroke [facility]) 05/2005    2 in 2007.  Weakness and numbness in left arm and leg  . SVT (supraventricular tachycardia) (HCC)     Outpatient Medications Prior to Visit  Medication Sig Dispense Refill  . albuterol (PROVENTIL) (2.5 MG/3ML) 0.083% nebulizer solution USE 1 VIAL IN NEBULIZER EVERY 4 HOURS AS NEEDED FOR WHEEZING OR SHORTNESS OF BREATH 450 mL 5  . budesonide (PULMICORT) 0.5 MG/2ML nebulizer solution USE 1 VIAL IN NEBULIZER TWICE DAILY 120 mL 4  . budesonide-formoterol (  SYMBICORT) 160-4.5 MCG/ACT inhaler Inhale 2 puffs into the lungs 2 (two) times daily.    . digoxin (LANOXIN) 0.125 MG tablet Take 1 tablet (0.125 mg total) by mouth daily. 90 tablet 3  . diltiazem (CARDIZEM CD) 300 MG 24 hr capsule Take 1 capsule (300 mg total) by mouth daily. 90 capsule 3  . furosemide (LASIX) 20 MG tablet Take 20 mg by mouth daily as needed.    Marland Kitchen guaiFENesin (MUCINEX) 600 MG 12 hr tablet Take 1 tablet (600 mg total) by mouth 2 (two) times daily. (Patient taking differently: Take 600 mg by mouth as needed.) 20 tablet 0  . mometasone (ELOCON) 0.1 % cream Apply BID PRN 45  g 1  . PROAIR HFA 108 (90 Base) MCG/ACT inhaler INHALE 2 PUFFS BY MOUTH EVERY 6 HOURS AS NEEDED FOR WHEEZING FOR SHORTNESS OF BREATH 9 g 5  . apixaban (ELIQUIS) 5 MG TABS tablet Take 1 tablet (5 mg total) by mouth 2 (two) times daily. 60 tablet 11  . doxycycline (VIBRA-TABS) 100 MG tablet Take one tablet po BID for 10 days 20 tablet 0  . Fluticasone-Umeclidin-Vilant (TRELEGY ELLIPTA) 100-62.5-25 MCG/INH AEPB 1 inhalation daily 1 each 12  . predniSONE (DELTASONE) 20 MG tablet 3 po qd x 3 d then 2 po qd x 3 d then 1 po qd x 3 d 18 tablet 0   No facility-administered medications prior to visit.     Objective:     BP (!) 110/54 (BP Location: Left Arm, Cuff Size: Normal)   Pulse 83   Temp (!) 97.4 F (36.3 C) (Temporal)   Ht 5\' 7"  (1.702 m)   Wt 167 lb 6.4 oz (75.9 kg)   SpO2 96% Comment: on RA  BMI 26.22 kg/m   SpO2: 96 % (on RA)   Wt Readings from Last 3 Encounters:  07/16/20 167 lb 6.4 oz (75.9 kg)  06/14/20 166 lb 12.8 oz (75.7 kg)  04/04/20 170 lb 12.8 oz (77.5 kg)     General appearance:   amb wm nad  HEENT : pt wearing mask not removed for exam due to covid -19 concerns.    NECK :  without JVD/Nodes/TM/ nl carotid upstrokes bilaterally   LUNGS: no acc muscle use,  Mod barrel  contour chest wall with bilateral exp junky rhonchi   and  without cough on insp or exp maneuvers and mod  Hyperresonant  to  percussion bilaterally     CV:  RRR  no s3 or murmur or increase in P2, and no edema   ABD:  soft and nontender with pos mid insp Hoover's  in the supine position. No bruits or organomegaly appreciated, bowel sounds nl  MS:     ext warm without deformities, calf tenderness, cyanosis or clubbing No obvious joint restrictions   SKIN: warm and dry without lesions    NEURO:  alert, approp, nl sensorium with  no motor or cerebellar deficits apparent.          I personally reviewed images and agree with radiology impression as follows:   Chest LDSCT  07/09/20 1.  Although many of the previously noted pulmonary nodules have resolved, there is progressive nodularity in the anterior aspect of the left upper lobe. Although this may simply represent developing rounded atelectasis, particularly in light of the chronic left-sided pleural effusion, the possibility of neoplasm warrants consideration and this is accordingly characterized as Lung-RADS 4BS, suspicious. Additional imaging evaluation or consultation with Pulmonology or Thoracic Surgery recommended.  Specifically, further evaluation with PET-CT should be considered. 2. Aortic atherosclerosis, in addition to left main and 3 vessel coronary artery disease. Assessment for potential risk factor modification, dietary therapy or pharmacologic therapy may be warranted, if clinically indicated. 3. There are calcifications of the aortic valve and mitral annulus. Echocardiographic correlation for evaluation of potential valvular dysfunction may be warranted if clinically indicated. 4. Diffuse bronchial wall thickening with mild centrilobular and paraseptal emphysema; imaging findings suggestive of underlying COPD    Assessment   Multiple pulmonary nodules CT 03/06/20 with new L  Effusion / MPNs CT 5/17/2 :  Progressive nodularity in the anterior aspect of the left upper lobe. Although this may simply represent developing rounded atelectasis, particularly in light of the chronic left-sided pleural effusion, the possibility of neoplasm warrants consideration  suspicious. - PET ordered 07/16/2020   Most likely these lesions and the effusion are post inflammatory but there is no hx to suggest any acute infection so best to do the PET as rec by radiology/ [employer] guidelines and go from there.   Discussed in detail all the  indications, usual  risks and alternatives  relative to the benefits with patient who agrees to proceed with w/u as outlined.       COPD severe GOLD III/ active  smoker Active smoker -  PFT's   05/29/15  FEV1 1.06 (34 % ) ratio 0.48  p 6 % improvement from saba p symbicort/spiriva prior to study  and FV curve classically concave  - 07/16/2020  After extensive coaching inhaler device,  effectiveness =    75% with hfa > breztri trial x 2 weeks    Most likely  Group D in terms of symptom/risk and laba/lama/ICS  therefore appropriate rx at this point >>> change symbicort to   breztri x 2 week trial pending determination of whether or not needs lung bx        Cigarette smoker Counseled re importance of smoking cessation but did not meet time criteria for separate billing     Each maintenance medication was reviewed in detail including emphasizing most importantly the difference between maintenance and prns and under what circumstances the prns are to be triggered using an action plan format where appropriate.  Total time for H and P, chart review, counseling, reviewing hfa device(s) and generating customized AVS unique to this office visit / same day charting =           Sandrea Hughs, MD 07/16/2020

## 2020-07-16 NOTE — Patient Instructions (Signed)
[name] , Thank you for taking time to come for your Medicare Wellness Visit. I appreciate your ongoing commitment to your health goals. Please review the following plan we discussed and let me know if I can assist you in the future.   Screening recommendations/referrals: Colonoscopy: Currently due for FOBT, we will order this for you  Recommended yearly ophthalmology/optometry visit for glaucoma screening and checkup Recommended yearly dental visit for hygiene and checkup  Vaccinations: Influenza vaccine: Up to date, next due fall 2022  Pneumococcal vaccine: Completed series  Tdap vaccine: Up to date, next due 12/24/2020 Shingles vaccine: Completed series     Advanced directives: Please bring in a copy of your advanced medical directives so that we may scan into your chart.   Conditions/risks identified: None   Next appointment: 07/24/2020 @ 9:00 am with Dr.Scott   Preventive Care 65 Years and Older, Male Preventive care refers to lifestyle choices and visits with your health care provider that can promote health and wellness. What does preventive care include?  A yearly physical exam. This is also called an annual well check.  Dental exams once or twice a year.  Routine eye exams. Ask your health care provider how often you should have your eyes checked.  Personal lifestyle choices, including:  Daily care of your teeth and gums.  Regular physical activity.  Eating a healthy diet.  Avoiding tobacco and drug use.  Limiting alcohol use.  Practicing safe sex.  Taking low doses of aspirin every day.  Taking vitamin and mineral supplements as recommended by your health care provider. What happens during an annual well check? The services and screenings done by your health care provider during your annual well check will depend on your age, overall health, lifestyle risk factors, and family history of disease. Counseling  Your health care provider may ask you questions  about your:  Alcohol use.  Tobacco use.  Drug use.  Emotional well-being.  Home and relationship well-being.  Sexual activity.  Eating habits.  History of falls.  Memory and ability to understand (cognition).  Work and work Astronomer. Screening  You may have the following tests or measurements:  Height, weight, and BMI.  Blood pressure.  Lipid and cholesterol levels. These may be checked every 5 years, or more frequently if you are over 54 years old.  Skin check.  Lung cancer screening. You may have this screening every year starting at age 4 if you have a 30-pack-year history of smoking and currently smoke or have quit within the past 15 years.  Fecal occult blood test (FOBT) of the stool. You may have this test every year starting at age 41.  Flexible sigmoidoscopy or colonoscopy. You may have a sigmoidoscopy every 5 years or a colonoscopy every 10 years starting at age 71.  Prostate cancer screening. Recommendations will vary depending on your family history and other risks.  Hepatitis C blood test.  Hepatitis B blood test.  Sexually transmitted disease (STD) testing.  Diabetes screening. This is done by checking your blood sugar (glucose) after you have not eaten for a while (fasting). You may have this done every 1-3 years.  Abdominal aortic aneurysm (AAA) screening. You may need this if you are a current or former smoker.  Osteoporosis. You may be screened starting at age 43 if you are at high risk. Talk with your health care provider about your test results, treatment options, and if necessary, the need for more tests. Vaccines  Your health care  provider may recommend certain vaccines, such as:  Influenza vaccine. This is recommended every year.  Tetanus, diphtheria, and acellular pertussis (Tdap, Td) vaccine. You may need a Td booster every 10 years.  Zoster vaccine. You may need this after age 55.  Pneumococcal 13-valent conjugate (PCV13)  vaccine. One dose is recommended after age 29.  Pneumococcal polysaccharide (PPSV23) vaccine. One dose is recommended after age 88. Talk to your health care provider about which screenings and vaccines you need and how often you need them. This information is not intended to replace advice given to you by your health care provider. Make sure you discuss any questions you have with your health care provider. Document Released: 03/08/2015 Document Revised: 10/30/2015 Document Reviewed: 12/11/2014 Elsevier Interactive Patient Education  2017 [place] Prevention in the Home Falls can cause injuries. They can happen to people of all ages. There are many things you can do to make your home safe and to help prevent falls. What can I do on the outside of my home?  Regularly fix the edges of walkways and driveways and fix any cracks.  Remove anything that might make you trip as you walk through a door, such as a raised step or threshold.  Trim any bushes or trees on the path to your home.  Use bright outdoor lighting.  Clear any walking paths of anything that might make someone trip, such as rocks or tools.  Regularly check to see if handrails are loose or broken. Make sure that both sides of any steps have handrails.  Any raised decks and porches should have guardrails on the edges.  Have any leaves, snow, or ice cleared regularly.  Use sand or salt on walking paths during winter.  Clean up any spills in your garage right away. This includes oil or grease spills. What can I do in the bathroom?  Use night lights.  Install grab bars by the toilet and in the tub and shower. Do not use towel bars as grab bars.  Use non-skid mats or decals in the tub or shower.  If you need to sit down in the shower, use a plastic, non-slip stool.  Keep the floor dry. Clean up any water that spills on the floor as soon as it happens.  Remove soap buildup in the tub or shower  regularly.  Attach bath mats securely with double-sided non-slip rug tape.  Do not have throw rugs and other things on the floor that can make you trip. What can I do in the bedroom?  Use night lights.  Make sure that you have a light by your bed that is easy to reach.  Do not use any sheets or blankets that are too big for your bed. They should not hang down onto the floor.  Have a firm chair that has side arms. You can use this for support while you get dressed.  Do not have throw rugs and other things on the floor that can make you trip. What can I do in the kitchen?  Clean up any spills right away.  Avoid walking on wet floors.  Keep items that you use a lot in easy-to-reach places.  If you need to reach something above you, use a strong step stool that has a grab bar.  Keep electrical cords out of the way.  Do not use floor polish or wax that makes floors slippery. If you must use wax, use non-skid floor wax.  Do not have throw  rugs and other things on the floor that can make you trip. What can I do with my stairs?  Do not leave any items on the stairs.  Make sure that there are handrails on both sides of the stairs and use them. Fix handrails that are broken or loose. Make sure that handrails are as long as the stairways.  Check any carpeting to make sure that it is firmly attached to the stairs. Fix any carpet that is loose or worn.  Avoid having throw rugs at the top or bottom of the stairs. If you do have throw rugs, attach them to the floor with carpet tape.  Make sure that you have a light switch at the top of the stairs and the bottom of the stairs. If you do not have them, ask someone to add them for you. What else can I do to help prevent falls?  Wear shoes that:  Do not have high heels.  Have rubber bottoms.  Are comfortable and fit you well.  Are closed at the toe. Do not wear sandals.  If you use a stepladder:  Make sure that it is fully  opened. Do not climb a closed stepladder.  Make sure that both sides of the stepladder are locked into place.  Ask someone to hold it for you, if possible.  Clearly mark and make sure that you can see:  Any grab bars or handrails.  First and last steps.  Where the edge of each step is.  Use tools that help you move around (mobility aids) if they are needed. These include:  Canes.  Walkers.  Scooters.  Crutches.  Turn on the lights when you go into a dark area. Replace any light bulbs as soon as they burn out.  Set up your furniture so you have a clear path. Avoid moving your furniture around.  If any of your floors are uneven, fix them.  If there are any pets around you, be aware of where they are.  Review your medicines with your doctor. Some medicines can make you feel dizzy. This can increase your chance of falling. Ask your doctor what other things that you can do to help prevent falls. This information is not intended to replace advice given to you by your health care provider. Make sure you discuss any questions you have with your health care provider. Document Released: 12/06/2008 Document Revised: 07/18/2015 Document Reviewed: 03/16/2014 Elsevier Interactive Patient Education  2017 [employer].

## 2020-07-16 NOTE — Assessment & Plan Note (Signed)
Active smoker -  PFT's   05/29/15  FEV1 1.06 (34 % ) ratio 0.48  p 6 % improvement from saba p symbicort/spiriva prior to study  and FV curve classically concave  - 07/16/2020  After extensive coaching inhaler device,  effectiveness =    75% with hfa > breztri trial x 2 weeks    Most likely  Group D in terms of symptom/risk and laba/lama/ICS  therefore appropriate rx at this point >>> change symbicort to   breztri x 2 week trial pending determination of whether or not needs lung bx          Each maintenance medication was reviewed in detail including emphasizing most importantly the difference between maintenance and prns and under what circumstances the prns are to be triggered using an action plan format where appropriate.  Total time for H and P, chart review, counseling, reviewing hfa device(s) and generating customized AVS unique to this office visit / same day charting = 

## 2020-07-16 NOTE — Patient Instructions (Addendum)
Plan A = Automatic = Always=    Breztri Take 2 puffs first thing in am and then another 2 puffs about 12 hours later.   Work on inhaler technique:  relax and gently blow all the way out then take a nice smooth deep breath back in, triggering the inhaler at same time you start breathing in.  Hold for up to 5 seconds if you can. Blow out thru nose. Rinse and gargle with water when done        Plan B = Backup (to supplement plan A, not to replace it) Only use your albuterol inhaler as a rescue medication to be used if you can't catch your breath by resting or doing a relaxed purse lip breathing pattern.  - The less you use it, the better it will work when you need it. - Ok to use the inhaler up to 2 puffs  every 4 hours if you must but call for appointment if use goes up over your usual need - Don't leave home without it !!  (think of it like the spare tire for your car)   Plan C = Crisis (instead of Plan B but only if Plan B stops working) - only use your albuterol nebulizer if you first try Plan B and it fails to help > ok to use the nebulizer up to every 4 hours but if start needing it regularly call for immediate appointment   We will call to schedule a PET scan   The key is to stop smoking completely before smoking completely stops you!    Office visit in [place] in 4 weeks

## 2020-07-16 NOTE — Assessment & Plan Note (Addendum)
CT 03/06/20 with new L  Effusion / MPNs CT 5/17/2 :  Progressive nodularity in the anterior aspect of the left upper lobe. Although this may simply represent developing rounded atelectasis, particularly in light of the chronic left-sided pleural effusion, the possibility of neoplasm warrants consideration  suspicious. - PET ordered 07/16/2020    Most likely these lesions and the effusion are post inflammatory but there is no hx to suggest any acute infection so best to do the PET as rec by radiology/ [employer] guidelines and go from there.   Discussed in detail all the  indications, usual  risks and alternatives  relative to the benefits with patient who agrees to proceed with w/u as outlined.

## 2020-07-17 ENCOUNTER — Other Ambulatory Visit: Payer: Self-pay | Admitting: Family Medicine

## 2020-07-24 ENCOUNTER — Ambulatory Visit (INDEPENDENT_AMBULATORY_CARE_PROVIDER_SITE_OTHER): Payer: PPO | Admitting: Family Medicine

## 2020-07-24 ENCOUNTER — Other Ambulatory Visit: Payer: Self-pay

## 2020-07-24 ENCOUNTER — Encounter: Payer: Self-pay | Admitting: Family Medicine

## 2020-07-24 VITALS — BP 138/86 | HR 56 | Temp 98.1°F | Ht 67.0 in | Wt 167.0 lb

## 2020-07-24 DIAGNOSIS — Z79899 Other long term (current) drug therapy: Secondary | ICD-10-CM | POA: Diagnosis not present

## 2020-07-24 DIAGNOSIS — E785 Hyperlipidemia, unspecified: Secondary | ICD-10-CM | POA: Diagnosis not present

## 2020-07-24 DIAGNOSIS — Z125 Encounter for screening for malignant neoplasm of prostate: Secondary | ICD-10-CM

## 2020-07-24 DIAGNOSIS — J42 Unspecified chronic bronchitis: Secondary | ICD-10-CM

## 2020-07-24 DIAGNOSIS — I10 Essential (primary) hypertension: Secondary | ICD-10-CM

## 2020-07-24 DIAGNOSIS — I700 Atherosclerosis of aorta: Secondary | ICD-10-CM

## 2020-07-24 MED ORDER — BUDESONIDE 0.5 MG/2ML IN SUSP: RESPIRATORY_TRACT | 11 refills | Status: AC

## 2020-07-24 MED ORDER — ROSUVASTATIN CALCIUM 5 MG PO TABS: 5.0000 mg | ORAL_TABLET | Freq: Every day | ORAL | 3 refills | Status: AC

## 2020-07-24 MED ORDER — BREZTRI AEROSPHERE 160-9-4.8 MCG/ACT IN AERO: 2.0000 | INHALATION_SPRAY | Freq: Two times a day (BID) | RESPIRATORY_TRACT | 4 refills | Status: AC

## 2020-07-24 NOTE — Progress Notes (Signed)
   Subjective:    Patient ID: Blake Cherry, male    DOB: [birth year], 75 y.o.   MRN: [id number]  HPImed check up.  Needs refill on breztri. Pt states he will run out of samples before he goes back to see Dr. Sherene Sires on June 24th.   Pt would like a refill on budesonide. This med is no longer on med list but pt states he would like to have some to keep on hand in case he needs it.   Patient had a recent CT scan which showed increased area of concern developing that may well need to have further intervention CAT scan coming up hopefully this will come out looking good  Patient is debilitated by his lung condition gets out of breath quickly oxygen staying in the low 90s  Denies any chest tightness or pressure pain    Review of Systems     Objective:   Physical Exam Lungs are listened to closely have some bilateral wheezing no rales or rhonchi in addition to this heart rate is controlled extremities no edema skin warm dry       Assessment & Plan:  Very nice patient Severe COPD patient was encouraged to stay away from smoking No sign of heart failure Refills given on inhaler CAT scan reviewed PET scan pending Patient will follow-up in approximately 3 to 4 months labs before that visit

## 2020-07-29 ENCOUNTER — Encounter (HOSPITAL_COMMUNITY)
Admission: RE | Admit: 2020-07-29 | Discharge: 2020-07-29 | Disposition: A | Discharge status: Home / Self Care | Payer: PPO | Source: Ambulatory Visit | Attending: Internal Medicine | Admitting: Internal Medicine

## 2020-07-29 ENCOUNTER — Other Ambulatory Visit: Payer: Self-pay

## 2020-07-29 DIAGNOSIS — J90 Pleural effusion, not elsewhere classified: Secondary | ICD-10-CM | POA: Insufficient documentation

## 2020-07-29 DIAGNOSIS — I517 Cardiomegaly: Secondary | ICD-10-CM | POA: Diagnosis not present

## 2020-07-29 DIAGNOSIS — I700 Atherosclerosis of aorta: Secondary | ICD-10-CM | POA: Diagnosis not present

## 2020-07-29 DIAGNOSIS — J439 Emphysema, unspecified: Secondary | ICD-10-CM | POA: Diagnosis not present

## 2020-07-29 DIAGNOSIS — I2510 Atherosclerotic heart disease of native coronary artery without angina pectoris: Secondary | ICD-10-CM | POA: Diagnosis not present

## 2020-07-29 DIAGNOSIS — R918 Other nonspecific abnormal finding of lung field: Secondary | ICD-10-CM | POA: Insufficient documentation

## 2020-07-29 DIAGNOSIS — K5730 Diverticulosis of large intestine without perforation or abscess without bleeding: Secondary | ICD-10-CM | POA: Diagnosis not present

## 2020-07-29 DIAGNOSIS — R911 Solitary pulmonary nodule: Secondary | ICD-10-CM | POA: Diagnosis not present

## 2020-07-29 LAB — GLUCOSE, CAPILLARY: Glucose-Capillary: 92 mg/dL (ref 70–99)

## 2020-07-29 MED ORDER — FLUDEOXYGLUCOSE F - 18 (FDG) INJECTION
8.3000 | Freq: Once | INTRAVENOUS | Status: AC | PRN
  Administered 2020-07-29: 8.3 via INTRAVENOUS

## 2020-07-30 NOTE — Progress Notes (Signed)
Spoke with pt and notified of results per Dr. Wert. Pt verbalized understanding and denied any questions. 

## 2020-08-15 ENCOUNTER — Encounter (INDEPENDENT_AMBULATORY_CARE_PROVIDER_SITE_OTHER): Payer: PPO | Admitting: Ophthalmology

## 2020-08-16 ENCOUNTER — Encounter: Payer: Self-pay | Admitting: Internal Medicine

## 2020-08-16 ENCOUNTER — Ambulatory Visit: Payer: PPO | Admitting: Internal Medicine

## 2020-08-16 ENCOUNTER — Other Ambulatory Visit: Payer: Self-pay

## 2020-08-16 DIAGNOSIS — J449 Chronic obstructive pulmonary disease, unspecified: Secondary | ICD-10-CM | POA: Diagnosis not present

## 2020-08-16 DIAGNOSIS — F17210 Nicotine dependence, cigarettes, uncomplicated: Secondary | ICD-10-CM

## 2020-08-16 MED ORDER — PREDNISONE 10 MG PO TABS: ORAL_TABLET | ORAL | 0 refills | Status: DC

## 2020-08-16 NOTE — Progress Notes (Signed)
Blake Cherry, male    DOB: [birth year]   MRN: [id number]   Brief patient profile:  34 yowm active smoker with confirmed  COPD III by pfts 05/07/15  referred to pulmonary clinic 07/16/2020 by Blake Cherry for abn LDSC ? 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      History of Present Illness  07/16/2020  Pulmonary/ 1st office eval/Blake Cherry  Chief Complaint  Patient presents with   Pulmonary Consult    Referral from Blake Robinsons, NP- eval of abnormal lung ca screening done on 07/11/20- [facility]. Pt states that he used to see Blake Cherry for COPD. He feels like his breathing is baseline. He has occ cough with gray sputum.   Dyspnea:  MMRC1 = can walk nl pace, flat grade, can't hurry or go uphills or steps s sob   Cough: am mucus / chronically sltly  greenish never bloody/ no assoc cp  Sleep: on side/ bed is flat  SABA use: neb each am followed by budesonide and  Then symbicort 2 every 12 hours  covid 19 status  vax x 3  Rec Plan A = Automatic = Always=    Breztri Take 2 puffs first thing in am and then another 2 puffs about 12 hours later.  Work on inhaler technique:     Plan B = Backup (to supplement plan A, not to replace it) Only use your albuterol inhaler as a rescue medication  Plan C = Crisis (instead of Plan B but only if Plan B stops working) - only use your albuterol nebulizer if you first try Plan B and it fails to help > ok to use the nebulizer up to every 4 hours but if start needing it regularly call for immediate appointment We will call to schedule a PET scan  The key is to stop smoking completely before smoking completely stops you! Office visit in [place] in 4 weeks     08/16/2020  f/u ov/[place] office/Blake Cherry re: GOLD IV COPD / still smoking Chief Complaint  Patient presents with   Follow-up    Breathing is overall doing well. Cough is unchanged. He is using his albuterol  inhaler 3-4 x per day and uses abuterol and budesonide nebs about every other day.    Dyspnea:  maybe a little better  Cough: same esp in am p sits up assoc nasal congestion/ hoarseness / mucoid only Sleeping: flat bed side SABA use: p activity and neb bid 02: none  Covid status: x 4  Lung cancer screening: already enrolled   No obvious day to day or daytime variability or assoc  purulent sputum or mucus plugs or hemoptysis or cp or chest tightness, subjective wheeze or overt sinus or hb symptoms.   Sleeping  without nocturnal  or early am exacerbation  of respiratory  c/o's or need for noct saba. Also denies any obvious fluctuation of symptoms with weather or environmental changes or other aggravating or alleviating factors except as outlined above   No unusual exposure hx or h/o childhood pna/ asthma or knowledge of premature birth.  Current Allergies, Complete Past Medical History, Past Surgical History, Family History, and Social History were reviewed in [employer] record.  ROS  The following are not active complaints unless bolded Hoarseness, sore throat, dysphagia, dental problems, itching, sneezing,  nasal congestion or discharge of excess mucus or purulent secretions, ear ache,  fever, chills, sweats, unintended wt loss or wt gain, classically pleuritic or exertional cp,  orthopnea pnd or arm/hand swelling  or leg swelling, presyncope, palpitations, abdominal pain, anorexia, nausea, vomiting, diarrhea  or change in bowel habits or change in bladder habits, change in stools or change in urine, dysuria, hematuria,  rash, arthralgias, visual complaints, headache, numbness, weakness or ataxia or problems with walking or coordination,  change in mood or  memory.                 Past Medical History:  Diagnosis Date   AAA (abdominal aortic aneurysm) (HCC)    Carotid artery occlusion 2007   Left cea   CKD (chronic kidney disease), stage III (HCC)    COPD  (chronic obstructive pulmonary disease) (HCC)    Diverticulitis    last time 4 years ago   Hypertension    Shortness of breath    with exertion   Stroke (HCC) 05/2005    2 in 2007.  Weakness and numbness in left arm and leg   SVT (supraventricular tachycardia) (HCC)        Objective:        08/16/2020       165 07/16/20 167 lb 6.4 oz (75.9 kg)  06/14/20 166 lb 12.8 oz (75.7 kg)  04/04/20 170 lb 12.8 oz (77.5 kg)    Vital signs reviewed  08/16/2020  - Note at rest 02 sats  97% on RA   General appearance:    amb wm nad    HEENT : pt wearing mask not removed for exam due to covid -19 concerns.    NECK :  without JVD/Nodes/TM/ nl carotid upstrokes bilaterally   LUNGS: no acc muscle use,  Mod barrel  contour chest wall with bilateral  insp exp rhonchi and  without cough on insp or exp maneuvers and mod  Hyperresonant  to  percussion bilaterally     CV:  RRR  no s3 or murmur or increase in P2, and no edema   ABD:  soft and nontender with pos mid insp Hoover's  in the supine position. No bruits or organomegaly appreciated, bowel sounds nl  MS:     ext warm without deformities, calf tenderness, cyanosis or clubbing No obvious joint restrictions   SKIN: warm and dry without lesions    NEURO:  alert, approp, nl sensorium with  no motor or cerebellar deficits apparent.           Assessment

## 2020-08-16 NOTE — Patient Instructions (Addendum)
Plan A = Automatic = Always=    Breztri Take 2 puffs first thing in am and then another 2 puffs about 12 hours later.    Work on inhaler technique:  relax and gently blow all the way out then take a nice smooth full deep breath back in, triggering the inhaler at same time you start breathing in.  Hold for up to 5 seconds if you can. Blow out thru nose. Rinse and gargle with water when done.  If mouth or throat bother you at all,  try brushing teeth/gums/tongue with arm and hammer toothpaste/ make a slurry and gargle and spit out.   Plan B = Backup (to supplement plan A, not to replace it) Only use your albuterol inhaler as a rescue medication to be used if you can't catch your breath by resting or doing a relaxed purse lip breathing pattern.  - The less you use it, the better it will work when you need it. - Ok to use the inhaler up to 2 puffs  every 4 hours if you must but call for appointment if use goes up over your usual need - Don't leave home without it !!  (think of it like the spare tire for your car)   Plan C = Crisis (instead of Plan B but only if Plan B stops working) - only use your albuterol nebulizer if you first try Plan B and it fails to help > ok to use the nebulizer up to every 4 hours but if start needing it regularly call for immediate appointment  Ok also to Try albuterol 15 min before an activity (on alternating days)  that you know would make you short of breath and see if it makes any difference and if makes none then don't take albuterol after activity unless you can't catch your breath as this means it's the resting that helps, not the albuterol.     Plan D = Deltasone (Prednisone) Prednisone 10 mg take  4 each am x 2 days,   2 each am x 2 days,  1 each am x 2 days and stop      Please schedule a follow up visit in 3 months but call sooner if needed

## 2020-08-18 ENCOUNTER — Encounter: Payer: Self-pay | Admitting: Internal Medicine

## 2020-08-18 NOTE — Assessment & Plan Note (Addendum)
4-5 min discussion re active cigarette smoking in addition to office E&M  Ask about tobacco use:   ongoing  Advise quitting   I took an extended  opportunity with this patient to outline the consequences of continued cigarette use  in airway disorders based on all the data we have from the multiple national lung health studies (perfomed over decades at millions of dollars in cost)  indicating that smoking cessation, not choice of inhalers or physicians, is the most important aspect of his care.   Assess willingness:  Not fully committed at this point Assist in quit attempt:  Per PCP when ready Arrange follow up:   Follow up per Primary Care planned    

## 2020-08-18 NOTE — Assessment & Plan Note (Addendum)
Active smoker -  PFT's   05/29/15  FEV1 1.06 (34 % ) ratio 0.48  p 6 % improvement from saba p symbicort/spiriva prior to study  and FV curve classically concave  - 07/16/2020   breztri trial x 2 weeks   - 08/16/2020  After extensive coaching inhaler device,  effectiveness =    90%    Group D in terms of symptom/risk and laba/lama/ICS  therefore appropriate rx at this point >>>  breztri plus albuterol prn plus Prednisone as Plan D on ABCD action plans see avs for instructions unique to this ov    Re saba: I spent extra time with pt today reviewing appropriate use of albuterol for prn use on exertion with the following points: 1) saba is for relief of sob that does not improve by walking a slower pace or resting but rather if the pt does not improve after trying this first. 2) If the pt is convinced, as many are, that saba helps recover from activity faster then it's easy to tell if this is the case by re-challenging : ie stop, take the inhaler, then p 5 minutes try the exact same activity (intensity of workload) that just caused the symptoms and see if they are substantially diminished or not after saba 3) if there is an activity that reproducibly causes the symptoms, try the saba 15 min before the activity on alternate days   If in fact the saba really does help, then fine to continue to use it prn but advised may need to look closer at the maintenance regimen being used to achieve better control of airways disease with exertion.      Each maintenance medication was reviewed in detail including emphasizing most importantly the difference between maintenance and prns and under what circumstances the prns are to be triggered using an action plan format where appropriate.  Total time for H and P, chart review, counseling, reviewing hfa  device(s) and generating customized AVS unique to this office visit / same day charting = 20  min

## 2020-10-03 ENCOUNTER — Encounter (INDEPENDENT_AMBULATORY_CARE_PROVIDER_SITE_OTHER): Payer: PPO | Admitting: Ophthalmology

## 2020-10-07 ENCOUNTER — Other Ambulatory Visit: Payer: Self-pay

## 2020-10-07 ENCOUNTER — Encounter (INDEPENDENT_AMBULATORY_CARE_PROVIDER_SITE_OTHER): Payer: Self-pay | Admitting: Ophthalmology

## 2020-10-07 ENCOUNTER — Ambulatory Visit (INDEPENDENT_AMBULATORY_CARE_PROVIDER_SITE_OTHER): Payer: PPO | Admitting: Ophthalmology

## 2020-10-07 DIAGNOSIS — H353124 Nonexudative age-related macular degeneration, left eye, advanced atrophic with subfoveal involvement: Secondary | ICD-10-CM | POA: Diagnosis not present

## 2020-10-07 DIAGNOSIS — H353113 Nonexudative age-related macular degeneration, right eye, advanced atrophic without subfoveal involvement: Secondary | ICD-10-CM | POA: Diagnosis not present

## 2020-10-07 NOTE — Assessment & Plan Note (Signed)
No signs of CNVM OS today 

## 2020-10-07 NOTE — Assessment & Plan Note (Signed)
Progressive atrophy OD nearing center vision.  No CNVM

## 2020-10-07 NOTE — Progress Notes (Signed)
10/07/2020     CHIEF COMPLAINT Patient presents for Retina Follow Up   HISTORY OF PRESENT ILLNESS: Blake Cherry is a 75 y.o. male who presents to the clinic today for:   HPI     Retina Follow Up           Diagnosis: Dry AMD   Laterality: both eyes   Onset: 7 months ago   Severity: mild   Duration: 7 months   Course: stable         Comments   7 month fu OU and OCT Pt states VA OU stable since last visit. Pt denies FOL, floaters, or ocular pain OU.  Pt states, "My left eye still has problems focusing but that seems to be about the same since last time."      Last edited by Demetrios Loll, COA on 10/07/2020 10:25 AM.      Referring physician: [name], MD [address] [place],  [place] [address]  HISTORICAL INFORMATION:   Selected notes from the MEDICAL RECORD NUMBER    Lab Results  Component Value Date   HGBA1C 6.3 (H) 09/17/2014     CURRENT MEDICATIONS: No current outpatient medications on file. (Ophthalmic Drugs)   No current facility-administered medications for this visit. (Ophthalmic Drugs)   Current Outpatient Medications (Other)  Medication Sig   albuterol (PROVENTIL) (2.5 MG/3ML) 0.083% nebulizer solution USE 1 VIAL IN NEBULIZER EVERY 4 HOURS AS NEEDED FOR WHEEZING OR SHORTNESS OF BREATH   Budeson-Glycopyrrol-Formoterol (BREZTRI AEROSPHERE) 160-9-4.8 MCG/ACT AERO Inhale 2 puffs into the lungs in the morning and at bedtime.   budesonide (PULMICORT) 0.5 MG/2ML nebulizer solution USE ONE VIAL IN NEBULIZER TWICE DAILY   digoxin (LANOXIN) 0.125 MG tablet Take 1 tablet (0.125 mg total) by mouth daily.   diltiazem (CARDIZEM CD) 300 MG 24 hr capsule Take 1 capsule (300 mg total) by mouth daily.   furosemide (LASIX) 20 MG tablet Take 20 mg by mouth daily as needed.   guaiFENesin (MUCINEX) 600 MG 12 hr tablet Take 1 tablet (600 mg total) by mouth 2 (two) times daily. (Patient taking differently: Take 600 mg by mouth as needed.)    mometasone (ELOCON) 0.1 % cream Apply BID PRN   predniSONE (DELTASONE) 10 MG tablet Take  4 each am x 2 days,   2 each am x 2 days,  1 each am x 2 days and stop   PROAIR HFA 108 (90 Base) MCG/ACT inhaler INHALE 2 PUFFS BY MOUTH EVERY 6 HOURS AS NEEDED FOR WHEEZING FOR SHORTNESS OF BREATH   rosuvastatin (CRESTOR) 5 MG tablet Take 1 tablet (5 mg total) by mouth daily.   No current facility-administered medications for this visit. (Other)      REVIEW OF SYSTEMS:    ALLERGIES No Known Allergies  PAST MEDICAL HISTORY Past Medical History:  Diagnosis Date   AAA (abdominal aortic aneurysm) (HCC)    Carotid artery occlusion 2007   Left cea   CKD (chronic kidney disease), stage III (HCC)    COPD (chronic obstructive pulmonary disease) (HCC)    Diverticulitis    last time 4 years ago   Hypertension    Shortness of breath    with exertion   Stroke (HCC) 05/2005    2 in 2007.  Weakness and numbness in left arm and leg   SVT (supraventricular tachycardia) (HCC)    Past Surgical History:  Procedure Laterality Date   ABDOMINAL AORTAGRAM N/A 03/10/2013  Procedure: ABDOMINAL AORTAGRAM;  Surgeon: Sherren Kerns, MD;  Location: [facility] CATH LAB;  Service: Cardiovascular;  Laterality: N/A;   ABDOMINAL AORTIC ENDOVASCULAR STENT GRAFT N/A 08/22/2012   Procedure: ABDOMINAL AORTIC ENDOVASCULAR STENT GRAFT;  Surgeon: Sherren Kerns, MD;  Location: [facility] OR;  Service: Vascular;  Laterality: N/A;   CAROTID ENDARTERECTOMY Left 2007   CAROTID STENT  2007   left internal carotid artery   TESTICLE TORSION REDUCTION     age of 48    FAMILY HISTORY Family History  Problem Relation Age of Onset   Cancer Mother        Breast cancer   Hypertension Mother    Diabetes Mother    Diabetes Father     SOCIAL HISTORY Social History   Tobacco Use   Smoking status: Every Day    Packs/day: 1.00    Years: 60.00    Pack years: 60.00    Types: Cigarettes   Smokeless tobacco: Former    Types: Snuff    Tobacco comments:    4-5 cigs per day 08/16/20//lmr  Vaping Use   Vaping Use: Never used  Substance Use Topics   Alcohol use: No    Alcohol/week: 0.0 standard drinks   Drug use: No         OPHTHALMIC EXAM:  Base Eye Exam     Visual Acuity (ETDRS)       Right Left   Dist cc 20/50 20/50 -2   Dist ph cc NI NI         Tonometry (Tonopen, 10:29 AM)       Right Left   Pressure 15 15         Pupils       Pupils Dark Light Shape React APD   Right PERRL 4 3 Round Brisk None   Left PERRL 3 2 Round Brisk None         Visual Fields (Counting fingers)       Left Right     Full         Extraocular Movement       Right Left    Full Full         Neuro/Psych     Oriented x3: Yes   Mood/Affect: Normal         Dilation     Both eyes: 1.0% Mydriacyl, 2.5% Phenylephrine @ 10:29 AM           Slit Lamp and Fundus Exam     External Exam       Right Left   External Normal Normal         Slit Lamp Exam       Right Left   Lids/Lashes Normal Normal   Conjunctiva/Sclera White and quiet White and quiet   Cornea Clear Clear   Anterior Chamber Deep and quiet Deep and quiet   Iris Round and reactive Round and reactive   Lens Centered posterior chamber intraocular lens, PC Clear Centered posterior chamber intraocular lens, PC Clear   Anterior Vitreous Normal Normal         Fundus Exam       Right Left   Posterior Vitreous Normal Normal   Disc Normal Normal   C/D Ratio 0.25 0.35   Macula Geographic atrophy, Pigmented atrophy, Splits the FAZ, no exudates, no hemorrhage, no macular thickening Geographic atrophy, Pigmented atrophy, In the FAZ, no macular thickening, no hemorrhage, no exudates   Vessels Normal Normal   Periphery  Normal Normal            IMAGING AND PROCEDURES  Imaging and Procedures for 10/07/20  OCT, Retina - OU - Both Eyes       Right Eye Quality was good. Scan locations included subfoveal. Central Foveal Thickness:  286. Progression has no prior data. Findings include outer retinal atrophy, central retinal atrophy, subretinal hyper-reflective material.   Left Eye Quality was good. Scan locations included subfoveal. Central Foveal Thickness: 273. Progression has no prior data. Findings include subretinal hyper-reflective material.   Notes No signs of active CNVM OU, extensive outer retinal disruption by RPE migration, drusenoid appearance and RPE choriocapillaris atrophy.               ASSESSMENT/PLAN:  Advanced nonexudative age-related macular degeneration of left eye with subfoveal involvement No signs of CNVM OS today  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 Progressive atrophy OD nearing center vision.  No CNVM     ICD-10-CM   1. 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  H35.3113 OCT, Retina - OU - Both Eyes    2. Advanced nonexudative age-related macular degeneration of left eye with subfoveal involvement  H35.3124 OCT, Retina - OU - Both Eyes      1.  No sign of CNVM OU  2.  Progressive geographic atrophy OU, OD accounts for acuity change  3.  Ophthalmic Meds Ordered this visit:  No orders of the defined types were placed in this encounter.      Return in about 7 months (around 05/07/2021) for DILATE OU, OCT.  There are no Patient Instructions on file for this visit.   Explained the diagnoses, plan, and follow up with the patient and they expressed understanding.  Patient expressed understanding of the importance of proper follow up care.   Alford Highland Arelene Moroni M.D. Diseases & Surgery of the Retina and Vitreous [facility] 10/07/20     Abbreviations: M myopia (nearsighted); A astigmatism; H hyperopia (farsighted); P presbyopia; Mrx spectacle prescription;  CTL contact lenses; OD right eye; OS left eye; OU both eyes  XT exotropia; ET  esotropia; PEK punctate epithelial keratitis; PEE punctate epithelial erosions; DES dry eye syndrome; MGD meibomian gland dysfunction; ATs artificial tears; PFAT's preservative free artificial tears; NSC nuclear sclerotic cataract; PSC posterior subcapsular cataract; ERM epi-retinal membrane; PVD posterior vitreous detachment; RD retinal detachment; DM diabetes mellitus; DR diabetic retinopathy; NPDR non-proliferative diabetic retinopathy; PDR proliferative diabetic retinopathy; CSME clinically significant macular edema; DME diabetic macular edema; dbh dot blot hemorrhages; CWS cotton wool spot; POAG primary open angle glaucoma; C/D cup-to-disc ratio; HVF humphrey visual field; GVF goldmann visual field; OCT optical coherence tomography; IOP intraocular pressure; BRVO Branch retinal vein occlusion; CRVO central retinal vein occlusion; CRAO central retinal artery occlusion; BRAO branch retinal artery occlusion; RT retinal tear; SB scleral buckle; PPV pars plana vitrectomy; VH Vitreous hemorrhage; PRP panretinal laser photocoagulation; IVK intravitreal kenalog; VMT vitreomacular traction; MH Macular hole;  NVD neovascularization of the disc; NVE neovascularization elsewhere; AREDS age related eye disease study; ARMD age related macular degeneration; POAG primary open angle glaucoma; EBMD epithelial/anterior basement membrane dystrophy; ACIOL anterior chamber intraocular lens; IOL intraocular lens; PCIOL posterior chamber intraocular lens; Phaco/IOL phacoemulsification with intraocular lens placement; PRK photorefractive keratectomy; LASIK laser assisted in situ keratomileusis; HTN hypertension; DM diabetes mellitus; COPD chronic obstructive pulmonary disease

## 2020-10-16 ENCOUNTER — Encounter: Payer: Self-pay | Admitting: Cardiology

## 2020-10-16 NOTE — Progress Notes (Signed)
Cardiology Office Note  Date: 10/17/2020   ID: Blake Cherry, DOB 1945-07-09, MRN [id number]  PCP:  Blake Sciara, MD  Cardiologist:  Blake Dell, MD Electrophysiologist:  None   Chief Complaint  Patient presents with   Cardiac follow-up     History of Present Illness: Blake Cherry is a 75 y.o. male former patient of Blake Cherry now presenting to establish follow-up with me.  I reviewed his records and updated the chart.  He was last seen in February by Blake Cherry.  His wife is a patient of mine.  He reports no palpitations or chest pain since last encounter.  We went over his medications, and he tells me that he decided to stop Eliquis due to significant cost once he went into the donut hole.  We did discuss other options today and will look into assistance.  His CHA2DS2-VASc score is 5 would benefit from long-term anticoagulation.  He has not had recent carotid Dopplers, previous history of left CEA.  No follow-up with vascular surgery.  He remains on Crestor, LDL 77 at last check.   Past Medical History:  Diagnosis Date   AAA (abdominal aortic aneurysm) (HCC)    Endovascular stent repair 2014   Atrial fibrillation and flutter (HCC)    Carotid artery disease (HCC) 2007   Left CEA   CKD (chronic kidney disease), stage III (HCC)    COPD (chronic obstructive pulmonary disease) (HCC)    Diverticulitis    Essential hypertension    Stroke (HCC) 2007   SVT (supraventricular tachycardia) (HCC)     Past Surgical History:  Procedure Laterality Date   ABDOMINAL AORTAGRAM N/A 03/10/2013   Procedure: ABDOMINAL Ronny Flurry;  Surgeon: Sherren Kerns, MD;  Location: [facility] CATH LAB;  Service: Cardiovascular;  Laterality: N/A;   ABDOMINAL AORTIC ENDOVASCULAR STENT GRAFT N/A 08/22/2012   Procedure: ABDOMINAL AORTIC ENDOVASCULAR STENT GRAFT;  Surgeon: Sherren Kerns, MD;  Location: [facility] OR;  Service: Vascular;  Laterality: N/A;   CAROTID ENDARTERECTOMY Left 2007   CAROTID  STENT  2007   left internal carotid artery   TESTICLE TORSION REDUCTION     age of 12    Current Outpatient Medications  Medication Sig Dispense Refill   albuterol (PROVENTIL) (2.5 MG/3ML) 0.083% nebulizer solution USE 1 VIAL IN NEBULIZER EVERY 4 HOURS AS NEEDED FOR WHEEZING OR SHORTNESS OF BREATH 450 mL 5   apixaban (ELIQUIS) 5 MG TABS tablet Take 1 tablet (5 mg total) by mouth 2 (two) times daily. 180 tablet 3   Budeson-Glycopyrrol-Formoterol (BREZTRI AEROSPHERE) 160-9-4.8 MCG/ACT AERO Inhale 2 puffs into the lungs in the morning and at bedtime. 10.7 g 4   budesonide (PULMICORT) 0.5 MG/2ML nebulizer solution USE ONE VIAL IN NEBULIZER TWICE DAILY 120 mL 11   digoxin (LANOXIN) 0.125 MG tablet Take 1 tablet (0.125 mg total) by mouth daily. 90 tablet 3   diltiazem (CARDIZEM CD) 300 MG 24 hr capsule Take 1 capsule (300 mg total) by mouth daily. 90 capsule 3   furosemide (LASIX) 20 MG tablet Take 20 mg by mouth daily as needed.     guaiFENesin (MUCINEX) 600 MG 12 hr tablet Take 1 tablet (600 mg total) by mouth 2 (two) times daily. (Patient taking differently: Take 600 mg by mouth as needed.) 20 tablet 0   mometasone (ELOCON) 0.1 % cream Apply BID PRN 45 g 1   PROAIR HFA 108 (90 Base) MCG/ACT inhaler INHALE 2 PUFFS BY MOUTH EVERY 6 HOURS  AS NEEDED FOR WHEEZING FOR SHORTNESS OF BREATH 9 g 3   rosuvastatin (CRESTOR) 5 MG tablet Take 1 tablet (5 mg total) by mouth daily. 90 tablet 3   predniSONE (DELTASONE) 10 MG tablet Take  4 each am x 2 days,   2 each am x 2 days,  1 each am x 2 days and stop (Patient not taking: Reported on 10/17/2020) 14 tablet 0   No current facility-administered medications for this visit.   Allergies:  Patient has no known allergies.   ROS: No dizziness or syncope.  Physical Exam: VS:  BP (!) 130/50   Pulse 80   Ht 5\' 8"  (1.727 m)   Wt 164 lb 11.2 oz (74.7 kg)   SpO2 98%   BMI 25.04 kg/m , BMI Body mass index is 25.04 kg/m.  Wt Readings from Last 3 Encounters:   10/17/20 164 lb 11.2 oz (74.7 kg)  08/16/20 165 lb (74.8 kg)  07/24/20 167 lb (75.8 kg)    General: Patient appears comfortable at rest. HEENT: Conjunctiva and lids normal, wearing a mask. Neck: Supple, no elevated JVP, left carotid bruit, no thyromegaly. Lungs: Clear to auscultation, nonlabored breathing at rest. Cardiac: Regular rate and rhythm, S3, 1/6 systolic murmur, no pericardial rub. Extremities: No pitting edema.  ECG:  An ECG dated 03/27/2020 was personally reviewed today and demonstrated:  Sinus rhythm.  Recent Labwork: 02/08/2020: ALT 10; AST 17; Hemoglobin 14.1; Platelets 300; TSH 3.400 03/21/2020: BUN 22; Creatinine, Ser 1.55; Potassium 4.8; Sodium 139     Component Value Date/Time   CHOL 138 02/08/2020 1456   CHOL 140 01/12/2019 0859   TRIG 64 02/08/2020 1456   HDL 48 02/08/2020 1456   HDL 50 01/12/2019 0859   CHOLHDL 2.9 02/08/2020 1456   VLDL 13 02/08/2020 1456   LDLCALC 77 02/08/2020 1456   LDLCALC 74 01/12/2019 0859    Other Studies Reviewed Today:  01/14/2019 Myoview 11/04/2017: There was no ST segment deviation noted during stress. The study is normal. There are no perfusion defects consistent with prior infarct or current ischemia. This is a low risk study. The left ventricular ejection fraction is hyperdynamic (>65%).  Echocardiogram 04/02/2020:  1. Left ventricular ejection fraction, by estimation, is 60 to 65%. The  left ventricle has normal function. The left ventricle has no regional  wall motion abnormalities. Left ventricular diastolic parameters are  indeterminate.   2. Right ventricular systolic function is normal. The right ventricular  size is normal.   3. Left atrial size was moderately dilated.   4. The mitral valve is normal in structure. Trivial mitral valve  regurgitation. No evidence of mitral stenosis.   5. The aortic valve is tricuspid. Aortic valve regurgitation is not  visualized. No aortic stenosis is present.   6. The inferior  vena cava is normal in size with <50% respiratory  variability, suggesting right atrial pressure of 8 mmHg.   Assessment and Plan:  1.  Paroxysmal atrial fibrillation/flutter with CHA2DS2-VASc score of 5.  He has been symptomatically stable without active palpitations and heart rate is regular today.  Continues on Cardizem CD and Lanoxin.  He did stop Eliquis in the interim concerned about high cost in the donut hole.  We will give him samples today and look into other assistance options.  2.  Carotid artery disease status post left CEA as well as left carotid stent in 2007.  Follow-up carotid Dopplers.  He is on Crestor.  3.  Mixed hyperlipidemia on Crestor,  LDL 77.  Medication Adjustments/Labs and Tests Ordered: Current medicines are reviewed at length with the patient today.  Concerns regarding medicines are outlined above.   Tests Ordered: Orders Placed This Encounter  Procedures   US Carotid Bilateral     Medication Changes: Meds ordered this encounter  Medications   apixaban (ELIQUIS) 5 MG TABS tablet    Sig: Take 1 tablet (5 mg total) by mouth 2 (two) times daily.    Dispense:  180 tablet    Refill:  3     Disposition:  Follow up  6 months.  Signed, Jonelle Sidle, MD, [facility] 10/17/2020 10:39 AM    [facility] at [facility] [address] [place], [place], [place] [address] Phone: [phone]; Fax: [phone]

## 2020-10-17 ENCOUNTER — Encounter: Payer: Self-pay | Admitting: *Deleted

## 2020-10-17 ENCOUNTER — Ambulatory Visit: Payer: PPO | Admitting: Cardiology

## 2020-10-17 ENCOUNTER — Encounter: Payer: Self-pay | Admitting: Cardiology

## 2020-10-17 VITALS — BP 130/50 | HR 80 | Ht 68.0 in | Wt 164.7 lb

## 2020-10-17 DIAGNOSIS — I779 Disorder of arteries and arterioles, unspecified: Secondary | ICD-10-CM

## 2020-10-17 DIAGNOSIS — I4891 Unspecified atrial fibrillation: Secondary | ICD-10-CM

## 2020-10-17 DIAGNOSIS — E782 Mixed hyperlipidemia: Secondary | ICD-10-CM | POA: Diagnosis not present

## 2020-10-17 DIAGNOSIS — I4892 Unspecified atrial flutter: Secondary | ICD-10-CM | POA: Diagnosis not present

## 2020-10-17 MED ORDER — APIXABAN 5 MG PO TABS: 5.0000 mg | ORAL_TABLET | Freq: Two times a day (BID) | ORAL | 3 refills | Status: DC

## 2020-10-17 MED ORDER — APIXABAN 5 MG PO TABS: 5.0000 mg | ORAL_TABLET | Freq: Two times a day (BID) | ORAL | 0 refills | Status: DC

## 2020-10-17 NOTE — Addendum Note (Signed)
Addended by: Eustace Moore on: 10/17/2020 10:57 AM   Modules accepted: Orders

## 2020-10-17 NOTE — Patient Instructions (Addendum)
Medication Instructions:  Your physician has recommended you make the following change in your medication:  Restart eliquis 5 mg twice daily Continue other medications the same  Labwork: none  Testing/Procedures: Your physician has requested that you have a carotid duplex. This test is an ultrasound of the carotid arteries in your neck. It looks at blood flow through these arteries that supply the brain with blood. Allow one hour for this exam. There are no restrictions or special instructions.  Follow-Up: Your physician recommends that you schedule a follow-up appointment in: 6 months in [place]  Any Other Special Instructions Will Be Listed Below (If Applicable).  If you need a refill on your cardiac medications before your next appointment, please call your pharmacy.

## 2020-11-04 DIAGNOSIS — N189 Chronic kidney disease, unspecified: Secondary | ICD-10-CM | POA: Diagnosis not present

## 2020-11-04 DIAGNOSIS — N1832 Chronic kidney disease, stage 3b: Secondary | ICD-10-CM | POA: Diagnosis not present

## 2020-11-04 DIAGNOSIS — N2581 Secondary hyperparathyroidism of renal origin: Secondary | ICD-10-CM | POA: Diagnosis not present

## 2020-11-04 DIAGNOSIS — I129 Hypertensive chronic kidney disease with stage 1 through stage 4 chronic kidney disease, or unspecified chronic kidney disease: Secondary | ICD-10-CM | POA: Diagnosis not present

## 2020-11-04 DIAGNOSIS — D631 Anemia in chronic kidney disease: Secondary | ICD-10-CM | POA: Diagnosis not present

## 2020-11-06 ENCOUNTER — Ambulatory Visit (INDEPENDENT_AMBULATORY_CARE_PROVIDER_SITE_OTHER): Payer: PPO

## 2020-11-06 ENCOUNTER — Other Ambulatory Visit: Payer: Self-pay

## 2020-11-06 DIAGNOSIS — R0989 Other specified symptoms and signs involving the circulatory and respiratory systems: Secondary | ICD-10-CM

## 2020-11-06 DIAGNOSIS — I779 Disorder of arteries and arterioles, unspecified: Secondary | ICD-10-CM | POA: Diagnosis not present

## 2020-11-07 ENCOUNTER — Telehealth: Payer: Self-pay | Admitting: *Deleted

## 2020-11-07 NOTE — Telephone Encounter (Signed)
Patient informed. Copy sent to PCP °

## 2020-11-07 NOTE — Telephone Encounter (Signed)
-----   Message from Jonelle Sidle, MD sent at 11/07/2020  9:52 AM EDT ----- Results reviewed.  Follow-up carotid Dopplers show mild RICA stenosis and moderate LICA stenosis.  Continue statin therapy and check follow-up carotid Doppler in 1 year.

## 2020-11-15 ENCOUNTER — Other Ambulatory Visit: Payer: Self-pay

## 2020-11-15 ENCOUNTER — Encounter: Payer: Self-pay | Admitting: Internal Medicine

## 2020-11-15 ENCOUNTER — Ambulatory Visit: Payer: PPO | Admitting: Internal Medicine

## 2020-11-15 ENCOUNTER — Telehealth: Payer: Self-pay | Admitting: Internal Medicine

## 2020-11-15 VITALS — BP 112/58 | HR 84 | Temp 98.6°F | Ht 67.0 in | Wt 166.2 lb

## 2020-11-15 DIAGNOSIS — J449 Chronic obstructive pulmonary disease, unspecified: Secondary | ICD-10-CM

## 2020-11-15 DIAGNOSIS — Z23 Encounter for immunization: Secondary | ICD-10-CM | POA: Diagnosis not present

## 2020-11-15 DIAGNOSIS — F17210 Nicotine dependence, cigarettes, uncomplicated: Secondary | ICD-10-CM | POA: Diagnosis not present

## 2020-11-15 DIAGNOSIS — R918 Other nonspecific abnormal finding of lung field: Secondary | ICD-10-CM

## 2020-11-15 MED ORDER — PREDNISONE 10 MG PO TABS: ORAL_TABLET | ORAL | 6 refills | Status: DC

## 2020-11-15 MED ORDER — AZITHROMYCIN 250 MG PO TABS: ORAL_TABLET | ORAL | 0 refills | Status: DC

## 2020-11-15 MED ORDER — DOXYCYCLINE HYCLATE 100 MG PO TABS: 100.0000 mg | ORAL_TABLET | Freq: Two times a day (BID) | ORAL | 0 refills | Status: DC

## 2020-11-15 NOTE — Telephone Encounter (Signed)
"  ZPak sent in has drug interaction with Digoxin."  Dr. Sherene Sires please advise.

## 2020-11-15 NOTE — Progress Notes (Signed)
Blake Cherry, male    DOB: 1945/06/19   MRN: [id number]   Brief patient profile:  2 yowm active smoker with confirmed  COPD III by pfts 05/07/15  referred to pulmonary clinic 07/16/2020 by Blake Cherry for abn LDSC ? 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 Pt states that he used to see Blake Cherry for COPD. He feels like his breathing is baseline. He has occ cough with gray sputum.   Dyspnea:  MMRC1 = can walk nl pace, flat grade, can't hurry or go uphills or steps s sob   Cough: am mucus / chronically sltly  greenish never bloody/ no assoc cp  Sleep: on side/ bed is flat  SABA use: neb each am followed by budesonide and  Then symbicort 2 every 12 hours  covid 19 status  vax x 3  Rec Plan A = Automatic = Always=    Breztri Take 2 puffs first thing in am and then another 2 puffs about 12 hours later.  Work on inhaler technique:     Plan B = Backup (to supplement plan A, not to replace it) Only use your albuterol inhaler as a rescue medication  Plan C = Crisis (instead of Plan B but only if Plan B stops working) - only use your albuterol nebulizer if you first try Plan B and it fails to help > ok to use the nebulizer up to every 4 hours but if start needing it regularly call for immediate appointment We will call to schedule a PET scan  The key is to stop smoking completely before smoking completely stops you! Cherry visit in Blake Cherry in 4 weeks     08/16/2020  f/u ov/Blake Cherry/Blake Cherry re: GOLD 3Bre COPD / still smoking Chief Complaint  Patient presents with   Follow-up    Breathing is  overall doing well. Cough is unchanged. He is using his albuterol inhaler 3-4 x per day and uses abuterol and budesonide nebs about every other day.    Dyspnea:  maybe a little better  Cough: same esp in am p sits up assoc nasal congestion/ hoarseness / mucoid only Sleeping: flat bed side SABA use: p activity and neb bid 02: none  Covid status: x 4  Lung cancer screening: already enrolled Rec Plan A = Automatic = Always=    Breztri Take 2 puffs first thing in am and then another 2 puffs about 12 hours later.   Work on inhaler technique:   Plan B = Backup (to supplement plan A, not to replace it) Only use your albuterol inhaler as a rescue medication  Plan C = Crisis (instead of Plan B but only if Plan B stops working) - only use your albuterol nebulizer if you first try Plan B and it fails to help > ok to use the nebulizer up to every 4 hours but if start needing it regularly call for immediate appointment Ok also to Try albuterol 15 min before an activity (on alternating days)  that you know would make you short of breath   Plan  D = Deltasone (Prednisone) Prednisone 10 mg take  4 each am x 2 days,   2 each am x 2 days,  1 each am x 2 days and stop         11/15/2020  f/u ov/Blake Cherry/Blake Cherry re: GOLD 3/ active smoker maint on [employer] Complaint  Patient presents with   Follow-up    Patient states he's been "feeling up and down." Coughing up mucus sometimes but unsure of the color. A little SOB with exertion. Pt states he doesn't have a lot of energy and seems to tire out easily.     Dyspnea:  MMRC1 = can walk nl pace, flat grade, can't hurry or go uphills or steps s sob   Cough: worse x one week first thing in am /  Sleeping: bed flat/ one pillow no cc SABA use: using neb 2-3 x per week  02: none  Covid status: x 4 vax      No obvious day to day or daytime variability or assoc excess/ purulent sputum or mucus plugs or hemoptysis or cp or chest tightness, subjective  wheeze or overt sinus or hb symptoms.   Sleeping  without nocturnal  or early am exacerbation  of respiratory  c/o's or need for noct saba. Also denies any obvious fluctuation of symptoms with weather or environmental changes or other aggravating or alleviating factors except as outlined above   No unusual exposure hx or h/o childhood pna/ asthma or knowledge of premature birth.  Current Allergies, Complete Past Medical History, Past Surgical History, Family History, and Social History were reviewed in Blake Cherry record.  ROS  The following are not active complaints unless bolded Hoarseness, sore throat, dysphagia, dental problems, itching, sneezing,  nasal congestion or discharge of excess mucus or purulent secretions, ear ache,   fever, chills, sweats, unintended wt loss or wt gain, classically pleuritic or exertional cp,  orthopnea pnd or arm/hand swelling  or leg swelling, presyncope, palpitations, abdominal pain, anorexia, nausea, vomiting, diarrhea  or change in bowel habits or change in bladder habits, change in stools or change in urine, dysuria, hematuria,  rash, arthralgias, visual complaints, headache, numbness, weakness or ataxia or problems with walking or coordination,  change in mood or  memory.        Current Meds  Medication Sig   albuterol (PROVENTIL) (2.5 MG/3ML) 0.083% nebulizer solution USE 1 VIAL IN NEBULIZER EVERY 4 HOURS AS NEEDED FOR WHEEZING OR SHORTNESS OF BREATH   apixaban (ELIQUIS) 5 MG TABS tablet Take 1 tablet (5 mg total) by mouth 2 (two) times daily.   Budeson-Glycopyrrol-Formoterol (BREZTRI AEROSPHERE) 160-9-4.8 MCG/ACT AERO Inhale 2 puffs into the lungs in the morning and at bedtime.   budesonide (PULMICORT) 0.5 MG/2ML nebulizer solution USE ONE VIAL IN NEBULIZER TWICE DAILY   digoxin (LANOXIN) 0.125 MG tablet Take 1 tablet (0.125 mg total) by mouth daily.   diltiazem (CARDIZEM CD) 300 MG 24 hr capsule Take 1 capsule (300 mg total) by  mouth daily.   furosemide (LASIX) 20 MG tablet Take 20 mg by mouth daily as needed.   guaiFENesin (MUCINEX) 600 MG 12 hr tablet Take 1 tablet (600 mg total) by mouth 2 (two) times daily. (Patient taking differently: Take 600 mg by mouth as needed.)   mometasone (ELOCON) 0.1 % cream Apply BID PRN   predniSONE (DELTASONE) 10 MG tablet Take  4 each am x 2 days,   2 each am x 2 days,  1 each am x 2 days and stop (Patient taking differently: Take  4 each am x 2 days,   2 each am x 2 days,  1 each am x 2 days and stop)   PROAIR HFA 108 (90 Base) MCG/ACT inhaler INHALE 2 PUFFS BY MOUTH EVERY 6 HOURS AS NEEDED FOR WHEEZING FOR SHORTNESS OF BREATH   rosuvastatin (CRESTOR) 5 MG tablet Take 1 tablet (5 mg total) by mouth daily.                  Past Medical History:  Diagnosis Date   AAA (abdominal aortic aneurysm) (HCC)    Carotid artery occlusion 2007   Left cea   CKD (chronic kidney disease), stage III (HCC)    COPD (chronic obstructive pulmonary disease) (HCC)    Diverticulitis    last time 4 years ago   Hypertension    Shortness of breath    with exertion   Stroke (HCC) 05/2005    2 in 2007.  Weakness and numbness in left arm and leg   SVT (supraventricular tachycardia) (HCC)        Objective:      11/15/2020       166   08/16/2020       165 07/16/20 167 lb 6.4 oz (75.9 kg)  06/14/20 166 lb 12.8 oz (75.7 kg)  04/04/20 170 lb 12.8 oz (77.5 kg)     Vital signs reviewed  11/15/2020  - Note at rest 02 sats  97% on RA   General appearance:    amb slt hoarse wm nad    HEENT : pt wearing mask not removed for exam due to covid -19 concerns.    NECK :  without JVD/Nodes/TM/ nl carotid upstrokes bilaterally   LUNGS: no acc muscle use,  Mod barrel  contour chest wall with bilateral  insp/exp rhonchi  and  without cough on insp or exp maneuvers and mod  Hyperresonant  to  percussion bilaterally     CV:  RRR  no s3 or murmur or increase in P2, and no edema   ABD:  soft and  nontender with pos mid insp Hoover's  in the supine position. No bruits or organomegaly appreciated, bowel sounds nl  MS:     ext warm without deformities, calf tenderness, cyanosis or clubbing No obvious joint restrictions   SKIN: warm and dry without lesions    NEURO:  alert, approp, nl sensorium with  no motor or cerebellar deficits apparent.          CXR PA and Lateral:   11/15/2020 :    I personally reviewed images and agree with radiology impression as follows:   Declined to have on day of ov "will return"     Assessment

## 2020-11-15 NOTE — Patient Instructions (Addendum)
For cough/ congestion>>> Mucinex up to 1200 mg every 12 hours as needed   Zpak   Stop all smoking if possible   Plan A = Automatic = Always=    Breztri Take 2 puffs first thing in am and then another 2 puffs about 12 hours later.    Work on inhaler technique:  relax and gently blow all the way out then take a nice smooth full deep breath back in, triggering the inhaler at same time you start breathing in.  Hold for up to 5 seconds if you can. Blow out thru nose. Rinse and gargle with water when done.  If mouth or throat bother you at all,  try brushing teeth/gums/tongue with arm and hammer toothpaste/ make a slurry and gargle and spit out.   Plan B = Backup (to supplement plan A, not to replace it) Only use your albuterol inhaler as a rescue medication to be used if you can't catch your breath by resting or doing a relaxed purse lip breathing pattern.  - The less you use it, the better it will work when you need it. - Ok to use the inhaler up to 2 puffs  every 4 hours if you must but call for appointment if use goes up over your usual need - Don't leave home without it !!  (think of it like the spare tire for your car)   Plan C = Crisis (instead of Plan B but only if Plan B stops working) - only use your albuterol nebulizer if you first try Plan B and it fails to help > ok to use the nebulizer up to every 4 hours but if start needing it regularly call for immediate appointment  Ok also to Try albuterol 15 min before an activity (on alternating days)  that you know would make you short of breath and see if it makes any difference and if makes none then don't take albuterol after activity unless you can't catch your breath as this means it's the resting that helps, not the albuterol.     Plan D = Deltasone (Prednisone/ refillable)  Prednisone 10 mg take  4 each am x 2 days,   2 each am x 2 days,  1 each am x 2 days and stop    Please remember to go to the  x-ray department  for your tests -  we will call you with the results when they are available     Please schedule a follow up visit in 3 months but call sooner if needed

## 2020-11-16 ENCOUNTER — Encounter: Payer: Self-pay | Admitting: Internal Medicine

## 2020-11-16 NOTE — Assessment & Plan Note (Signed)
Counseled re importance of smoking cessation but did not meet time criteria for separate billing   °

## 2020-11-16 NOTE — Assessment & Plan Note (Addendum)
Active smoker -  PFT's   05/29/15  FEV1 1.06 (34 % ) ratio 0.48  p 6 % improvement from saba p symbicort/spiriva prior to study  and FV curve classically concave  - 07/16/2020   breztri trial x 2 weeks   - 08/16/2020 added pred as plan D  - 11/15/2020  After extensive coaching inhaler device,  effectiveness =    90% > continue breztri   Having mild flare: zpak/ Prednisone 10 mg take  4 each am x 2 days,   2 each am x 2 days,  1 each am x 2 days and stop    Group D in terms of symptom/risk and laba/lama/ICS  therefore appropriate rx at this point >>>  breztri and approp saba:  Re SABA :  I spent extra time with pt today reviewing appropriate use of albuterol for prn use on exertion with the following points: 1) saba is for relief of sob that does not improve by walking a slower pace or resting but rather if the pt does not improve after trying this first. 2) If the pt is convinced, as many are, that saba helps recover from activity faster then it's easy to tell if this is the case by re-challenging : ie stop, take the inhaler, then p 5 minutes try the exact same activity (intensity of workload) that just caused the symptoms and see if they are substantially diminished or not after saba 3) if there is an activity that reproducibly causes the symptoms, try the saba 15 min before the activity on alternate days   If in fact the saba really does help, then fine to continue to use it prn but advised may need to look closer at the maintenance regimen being used to achieve better control of airways disease with exertion.

## 2020-11-16 NOTE — Assessment & Plan Note (Signed)
CT 03/06/20 with new L  Effusion / MPNs CT 5/17/2 :  Progressive nodularity in the anterior aspect of the left upper lobe. Although this may simply represent developing rounded atelectasis, particularly in light of the chronic left-sided pleural effusion, the possibility of neoplasm warrants consideration  suspicious. - PET 07/29/20  Neg c/w rounded atx> rec f/u CT chest @  6 m   May have had significant asbestos exp as child leading to rounded atx finding but either way due to smoking hx rec f/u at 6 m = 01/28/21 placed in reminder file          Each maintenance medication was reviewed in detail including emphasizing most importantly the difference between maintenance and prns and under what circumstances the prns are to be triggered using an action plan format where appropriate.  Total time for H and P, chart review, counseling, reviewing hfa/neb device(s) and generating customized AVS unique to this office visit / same day charting > 30 min

## 2020-11-21 DIAGNOSIS — J42 Unspecified chronic bronchitis: Secondary | ICD-10-CM | POA: Diagnosis not present

## 2020-11-21 DIAGNOSIS — I10 Essential (primary) hypertension: Secondary | ICD-10-CM | POA: Diagnosis not present

## 2020-11-21 DIAGNOSIS — E785 Hyperlipidemia, unspecified: Secondary | ICD-10-CM | POA: Diagnosis not present

## 2020-11-21 DIAGNOSIS — I700 Atherosclerosis of aorta: Secondary | ICD-10-CM | POA: Diagnosis not present

## 2020-11-21 DIAGNOSIS — Z79899 Other long term (current) drug therapy: Secondary | ICD-10-CM | POA: Diagnosis not present

## 2020-11-21 DIAGNOSIS — Z125 Encounter for screening for malignant neoplasm of prostate: Secondary | ICD-10-CM | POA: Diagnosis not present

## 2020-11-22 LAB — BASIC METABOLIC PANEL
BUN/Creatinine Ratio: 17 (ref 10–24)
BUN: 23 mg/dL (ref 8–27)
CO2: 26 mmol/L (ref 20–29)
Calcium: 9.3 mg/dL (ref 8.6–10.2)
Chloride: 105 mmol/L (ref 96–106)
Creatinine, Ser: 1.37 mg/dL — ABNORMAL HIGH (ref 0.76–1.27)
Glucose: 99 mg/dL (ref 70–99)
Potassium: 4.3 mmol/L (ref 3.5–5.2)
Sodium: 142 mmol/L (ref 134–144)
eGFR: 54 mL/min/{1.73_m2} — ABNORMAL LOW (ref >59)

## 2020-11-22 LAB — LIPID PANEL
Chol/HDL Ratio: 1.7 ratio (ref 0.0–5.0)
Cholesterol, Total: 91 mg/dL — ABNORMAL LOW (ref 100–199)
HDL: 55 mg/dL (ref >39)
LDL Chol Calc (NIH): 21 mg/dL (ref 0–99)
Triglycerides: 71 mg/dL (ref 0–149)
VLDL Cholesterol Cal: 15 mg/dL (ref 5–40)

## 2020-11-22 LAB — PSA: Prostate Specific Ag, Serum: 1 ng/mL (ref 0.0–4.0)

## 2020-11-22 LAB — HEPATIC FUNCTION PANEL
ALT: 8 IU/L (ref 0–44)
AST: 13 IU/L (ref 0–40)
Albumin: 3.9 g/dL (ref 3.7–4.7)
Alkaline Phosphatase: 71 IU/L (ref 44–121)
Bilirubin Total: <0.2 mg/dL (ref 0.0–1.2)
Bilirubin, Direct: <0.1 mg/dL (ref 0.00–0.40)
Total Protein: 6.3 g/dL (ref 6.0–8.5)

## 2020-11-22 LAB — DIGOXIN LEVEL: Digoxin, Serum: 0.5 ng/mL (ref 0.5–0.9)

## 2020-11-25 ENCOUNTER — Encounter: Payer: Self-pay | Admitting: Family Medicine

## 2020-11-25 ENCOUNTER — Ambulatory Visit (INDEPENDENT_AMBULATORY_CARE_PROVIDER_SITE_OTHER): Payer: PPO | Admitting: Family Medicine

## 2020-11-25 ENCOUNTER — Other Ambulatory Visit: Payer: Self-pay

## 2020-11-25 VITALS — BP 128/52 | HR 61 | Temp 97.9°F | Ht 67.0 in | Wt 168.0 lb

## 2020-11-25 DIAGNOSIS — Z1211 Encounter for screening for malignant neoplasm of colon: Secondary | ICD-10-CM | POA: Diagnosis not present

## 2020-11-25 DIAGNOSIS — I10 Essential (primary) hypertension: Secondary | ICD-10-CM

## 2020-11-25 NOTE — Progress Notes (Signed)
   Subjective:    Patient ID: Blake Cherry, male    DB: Jul 10, 1945, 75 y.o.   MRN: [id number]  HPI 4 month follow up - HTN, hyperlipidemia, aortic atherosclerosis Recent COPD exacerbation - completed z pack and prednisone Very nice gentleman Recently had COPD exacerbation but doing much better now Takes his medication keeps blood pressure under good control Does need stool testing but not a great candidate for colonoscopy because the prep would be hard on him so therefore start with stool testing first  Review of Systems     Objective:   Physical Exam  General-in no acute distress Eyes-no discharge Lungs-respiratory rate normal, CTA CV-no murmurs,RRR Extremities skin warm dry no edema Neuro grossly normal Behavior normal, alert       Assessment & Plan:  1. Essential hypertension, benign Blood pressure good control continue current measures.  Follow-up if progressive troubles otherwise recheck in 6 months  2. Colon cancer screening Stool testing for blood. - IFOBT POC (occult bld, rslt in office); Future  COVID-vaccine booster recommended  Patient does have severe underlying COPD  Patient had CT scan of the lungs and also a PET scan previously we will send Dr. Sherene Sires a message to see when they recommend a follow-up CT scan of the lungs

## 2020-12-03 ENCOUNTER — Other Ambulatory Visit (HOSPITAL_COMMUNITY): Payer: Self-pay | Admitting: Cardiology

## 2020-12-03 DIAGNOSIS — I6523 Occlusion and stenosis of bilateral carotid arteries: Secondary | ICD-10-CM

## 2021-01-15 ENCOUNTER — Other Ambulatory Visit: Payer: Self-pay | Admitting: Internal Medicine

## 2021-01-15 DIAGNOSIS — R918 Other nonspecific abnormal finding of lung field: Secondary | ICD-10-CM

## 2021-01-21 ENCOUNTER — Other Ambulatory Visit: Payer: Self-pay

## 2021-01-21 ENCOUNTER — Encounter: Payer: Self-pay | Admitting: Nurse Practitioner

## 2021-01-21 ENCOUNTER — Ambulatory Visit (INDEPENDENT_AMBULATORY_CARE_PROVIDER_SITE_OTHER): Payer: PPO | Admitting: Nurse Practitioner

## 2021-01-21 VITALS — BP 128/86 | HR 89 | Temp 97.1°F | Wt 172.2 lb

## 2021-01-21 DIAGNOSIS — H6122 Impacted cerumen, left ear: Secondary | ICD-10-CM

## 2021-01-21 DIAGNOSIS — J441 Chronic obstructive pulmonary disease with (acute) exacerbation: Secondary | ICD-10-CM

## 2021-01-21 MED ORDER — PREDNISONE 10 MG PO TABS: ORAL_TABLET | ORAL | 6 refills | Status: DC

## 2021-01-21 MED ORDER — PREDNISONE 10 MG PO TABS
ORAL_TABLET | ORAL | 1 refills | Status: DC
Start: 2021-01-21 — End: 2021-02-19

## 2021-01-21 MED ORDER — DOXYCYCLINE HYCLATE 100 MG PO TABS: 100.0000 mg | ORAL_TABLET | Freq: Two times a day (BID) | ORAL | 0 refills | Status: AC

## 2021-01-21 NOTE — Progress Notes (Signed)
Subjective:    Patient ID: Blake Cherry, male    DOB: March 22, 1945, 75 y.o.   MRN: [id number]  HPI Patient here for SOB, fatigue, productive cough, and wheezing x1 week.  Patient also admits to sinus pressure, sinus congestion, and ear pressure. Patient currently using albuterol nebulizer x3 a day, Pulmicort as prescribed, and Symbicort which help temporarily. Patient prescribed Breztri, however has not had the medication refilled due to cost.  Patient denies LOC, headache, runny nose, fever, chills.   Patient has COPD with last exacerbation ~11/2020. Patient states that he has had about 3-4 COPD exacerbation this year.  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   Patient is currently smoker, ~3-4 cigarettes a day.   Review of Systems  Constitutional:  Positive for activity change and fatigue. Negative for chills and fever.       Activity change r/t SOB  HENT:  Positive for congestion and postnasal drip. Negative for ear discharge, ear pain, hearing loss, rhinorrhea, sinus pressure and sinus pain.   Respiratory:  Positive for cough and shortness of breath.   Cardiovascular:  Negative for chest pain and palpitations.  Neurological:  Negative for weakness, light-headedness and headaches.      Objective:   Physical Exam Vitals (Afebrile) reviewed.  Constitutional:      General: He is not in acute distress.    Appearance: Normal appearance. He is not ill-appearing or toxic-appearing.  HENT:     Right Ear: Tympanic membrane, ear canal and external ear normal. There is no impacted cerumen.     Left Ear: There is impacted cerumen.     Nose: Nose normal. No congestion or rhinorrhea.     Mouth/Throat:     Mouth: Mucous membranes are moist.     Pharynx: No oropharyngeal exudate or posterior oropharyngeal erythema.  Cardiovascular:     Rate and Rhythm: Normal rate and regular rhythm.     Pulses: Normal pulses.     Heart sounds: Normal heart sounds. No murmur  heard. Pulmonary:     Effort: Pulmonary effort is normal. No accessory muscle usage, prolonged expiration or respiratory distress.     Breath sounds: Examination of the right-upper field reveals wheezing. Examination of the left-upper field reveals wheezing. Examination of the right-middle field reveals wheezing. Examination of the left-middle field reveals wheezing. Examination of the right-lower field reveals wheezing. Examination of the left-lower field reveals wheezing. Wheezing present. No decreased breath sounds, rhonchi or rales.     Comments: Patient walked one lap through clinic, pulse ox 91% on exertion but rapidly comes up to 94% at rest.  Neurological:     Mental Status: He is alert.          Assessment & Plan:  1. COPD exacerbation (HCC) - predniSONE (DELTASONE) 10 MG tablet; Take  4 each am x 2 days,   2 each am x 2 days,  1 each am x 2 days and stop  Dispense: 14 tablet; Refill: 1 - doxycycline (VIBRA-TABS) 100 MG tablet; Take 1 tablet (100 mg total) by mouth 2 (two) times daily for 7 days.  Dispense: 14 tablet; Refill: 0 - Smoking cessation aids offered. Patient declined at this time. - Discussed dangers of smoking with COPD diagnosis. Patient stated unerstanding - F/u with pulmonologist as scheduled - If develop fever or if SOB is worse, RTC or go to Urgent Care or ED. - Follow up in 2 weeks to re-assess breathing  2.  Left ear cerumen impaction - may use OTC debrox - Follow up in 2 weeks - May consider ENT referral if impaction not cleared with debrox

## 2021-01-23 DIAGNOSIS — Z7951 Long term (current) use of inhaled steroids: Secondary | ICD-10-CM | POA: Diagnosis not present

## 2021-01-23 DIAGNOSIS — F17200 Nicotine dependence, unspecified, uncomplicated: Secondary | ICD-10-CM | POA: Diagnosis not present

## 2021-01-23 DIAGNOSIS — J441 Chronic obstructive pulmonary disease with (acute) exacerbation: Secondary | ICD-10-CM | POA: Diagnosis not present

## 2021-01-28 ENCOUNTER — Encounter (HOSPITAL_COMMUNITY): Payer: Self-pay

## 2021-01-28 ENCOUNTER — Emergency Department (HOSPITAL_COMMUNITY): Payer: PPO

## 2021-01-28 ENCOUNTER — Other Ambulatory Visit: Payer: Self-pay

## 2021-01-28 ENCOUNTER — Emergency Department (HOSPITAL_COMMUNITY)
Admission: EM | Admit: 2021-01-28 | Discharge: 2021-01-28 | Disposition: A | Discharge status: Home / Self Care | Payer: PPO | Attending: Emergency Medicine | Admitting: Emergency Medicine

## 2021-01-28 DIAGNOSIS — R002 Palpitations: Secondary | ICD-10-CM | POA: Diagnosis not present

## 2021-01-28 DIAGNOSIS — J449 Chronic obstructive pulmonary disease, unspecified: Secondary | ICD-10-CM | POA: Diagnosis not present

## 2021-01-28 DIAGNOSIS — Z7951 Long term (current) use of inhaled steroids: Secondary | ICD-10-CM | POA: Diagnosis not present

## 2021-01-28 DIAGNOSIS — R079 Chest pain, unspecified: Secondary | ICD-10-CM | POA: Diagnosis not present

## 2021-01-28 DIAGNOSIS — Z79899 Other long term (current) drug therapy: Secondary | ICD-10-CM | POA: Diagnosis not present

## 2021-01-28 DIAGNOSIS — R000 Tachycardia, unspecified: Secondary | ICD-10-CM | POA: Insufficient documentation

## 2021-01-28 DIAGNOSIS — R0602 Shortness of breath: Secondary | ICD-10-CM | POA: Diagnosis not present

## 2021-01-28 DIAGNOSIS — N183 Chronic kidney disease, stage 3 unspecified: Secondary | ICD-10-CM | POA: Insufficient documentation

## 2021-01-28 DIAGNOSIS — F17210 Nicotine dependence, cigarettes, uncomplicated: Secondary | ICD-10-CM | POA: Diagnosis not present

## 2021-01-28 DIAGNOSIS — I129 Hypertensive chronic kidney disease with stage 1 through stage 4 chronic kidney disease, or unspecified chronic kidney disease: Secondary | ICD-10-CM | POA: Insufficient documentation

## 2021-01-28 DIAGNOSIS — E1122 Type 2 diabetes mellitus with diabetic chronic kidney disease: Secondary | ICD-10-CM | POA: Diagnosis not present

## 2021-01-28 DIAGNOSIS — J90 Pleural effusion, not elsewhere classified: Secondary | ICD-10-CM | POA: Diagnosis not present

## 2021-01-28 DIAGNOSIS — Z7901 Long term (current) use of anticoagulants: Secondary | ICD-10-CM | POA: Diagnosis not present

## 2021-01-28 LAB — BASIC METABOLIC PANEL
Anion gap: 6 (ref 5–15)
BUN: 25 mg/dL — ABNORMAL HIGH (ref 8–23)
CO2: 26 mmol/L (ref 22–32)
Calcium: 7.9 mg/dL — ABNORMAL LOW (ref 8.9–10.3)
Chloride: 108 mmol/L (ref 98–111)
Creatinine, Ser: 1.41 mg/dL — ABNORMAL HIGH (ref 0.61–1.24)
GFR, Estimated: 52 mL/min — ABNORMAL LOW (ref >60)
Glucose, Bld: 104 mg/dL — ABNORMAL HIGH (ref 70–99)
Potassium: 4.5 mmol/L (ref 3.5–5.1)
Sodium: 140 mmol/L (ref 135–145)

## 2021-01-28 LAB — HEPATIC FUNCTION PANEL
ALT: 10 U/L (ref 0–44)
AST: 17 U/L (ref 15–41)
Albumin: 3.3 g/dL — ABNORMAL LOW (ref 3.5–5.0)
Alkaline Phosphatase: 59 U/L (ref 38–126)
Bilirubin, Direct: 0.1 mg/dL (ref 0.0–0.2)
Indirect Bilirubin: 0 mg/dL — ABNORMAL LOW (ref 0.3–0.9)
Total Bilirubin: 0.1 mg/dL — ABNORMAL LOW (ref 0.3–1.2)
Total Protein: 5.6 g/dL — ABNORMAL LOW (ref 6.5–8.1)

## 2021-01-28 LAB — CBC
HCT: 34.3 % — ABNORMAL LOW (ref 39.0–52.0)
Hemoglobin: 10.6 g/dL — ABNORMAL LOW (ref 13.0–17.0)
MCH: 31.3 pg (ref 26.0–34.0)
MCHC: 30.9 g/dL (ref 30.0–36.0)
MCV: 101.2 fL — ABNORMAL HIGH (ref 80.0–100.0)
Platelets: 358 10*3/uL (ref 150–400)
RBC: 3.39 MIL/uL — ABNORMAL LOW (ref 4.22–5.81)
RDW: 14.6 % (ref 11.5–15.5)
WBC: 16.2 10*3/uL — ABNORMAL HIGH (ref 4.0–10.5)
nRBC: 0 % (ref 0.0–0.2)

## 2021-01-28 LAB — BRAIN NATRIURETIC PEPTIDE: B Natriuretic Peptide: 370 pg/mL — ABNORMAL HIGH (ref 0.0–100.0)

## 2021-01-28 LAB — TROPONIN I (HIGH SENSITIVITY)
Troponin I (High Sensitivity): 47 ng/L — ABNORMAL HIGH (ref <18)
Troponin I (High Sensitivity): 56 ng/L — ABNORMAL HIGH (ref <18)

## 2021-01-28 LAB — DIGOXIN LEVEL: Digoxin Level: 0.8 ng/mL (ref 0.8–2.0)

## 2021-01-28 MED ORDER — LACTATED RINGERS IV BOLUS
1000.0000 mL | Freq: Once | INTRAVENOUS | Status: AC
  Administered 2021-01-28: 1000 mL via INTRAVENOUS

## 2021-01-28 MED ORDER — LEVALBUTEROL HCL 1.25 MG/0.5ML IN NEBU
INHALATION_SOLUTION | RESPIRATORY_TRACT | Status: AC
  Filled 2021-01-28: qty 0.5

## 2021-01-28 MED ORDER — IOHEXOL 350 MG/ML SOLN
75.0000 mL | Freq: Once | INTRAVENOUS | Status: AC | PRN
  Administered 2021-01-28: 75 mL via INTRAVENOUS

## 2021-01-28 MED ORDER — DILTIAZEM HCL 25 MG/5ML IV SOLN
INTRAVENOUS | Status: AC
  Filled 2021-01-28: qty 5

## 2021-01-28 MED ORDER — LEVALBUTEROL HCL 1.25 MG/0.5ML IN NEBU
1.2500 mg | INHALATION_SOLUTION | Freq: Once | RESPIRATORY_TRACT | Status: AC
  Administered 2021-01-28: 1.25 mg via RESPIRATORY_TRACT

## 2021-01-28 MED ORDER — DILTIAZEM HCL 25 MG/5ML IV SOLN
20.0000 mg | Freq: Once | INTRAVENOUS | Status: AC
  Administered 2021-01-28: 20 mg via INTRAVENOUS

## 2021-01-28 NOTE — ED Provider Notes (Signed)
[facility] EMERGENCY DEPARTMENT Provider Note   CSN: [id number] Arrival date & time: 01/28/21  9509     History Chief Complaint  Patient presents with   Chest Pain    Blake Cherry is a 75 y.o. male.  75 year old male with multi medical problems documented below to include A. fib/flutter and history of SVT.  He is on diltiazem, digoxin and Eliquis at home.  Patient states that he had acute cute onset of left-sided chest discomfort with palpitations earlier tonight.  States this feels similar to when he had SVT in the past.  Presents here for further evaluation.  Patient was using his albuterol prior to this happening.  No missed medications.  No recent illnesses otherwise   Chest Pain     Past Medical History:  Diagnosis Date   AAA (abdominal aortic aneurysm)    Endovascular stent repair 2014   Atrial fibrillation and flutter (HCC)    Carotid artery disease (HCC) 2007   Left CEA   CKD (chronic kidney disease), stage III (HCC)    COPD (chronic obstructive pulmonary disease) (HCC)    Diverticulitis    Essential hypertension    Stroke (HCC) 2007   SVT (supraventricular tachycardia) (HCC)     Patient Active Problem List   Diagnosis Date Noted   Cigarette smoker 07/16/2020   Multiple pulmonary nodules 07/12/2020   Atrial flutter (HCC) 02/27/2020   Anticoagulated 02/27/2020   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 02/15/2020   Advanced nonexudative age-related macular degeneration of left eye with subfoveal involvement 02/15/2020   Chest pain of uncertain etiology 10/27/2017   SVT (supraventricular tachycardia) (HCC) 10/03/2017   Leukocytosis 11/24/2016   COPD with acute exacerbation (HCC) 09/16/2014   AKI (acute kidney injury) (HCC) 09/16/2014   CRI (chronic renal insufficiency), stage 3 (moderate) (HCC) 09/16/2014   Hyperglycemia, drug-induced 09/16/2014   COPD severe GOLD III/ active smoker 07/18/2014    Obesity    Aftercare following surgery of the circulatory system, NEC 09/22/2012   Essential hypertension, benign 09/02/2012   Hyperlipidemia 09/02/2012   Tobacco abuse 09/02/2012   Abdominal aneurysm without mention of rupture 10/20/2011   Carotid artery disease (HCC) 2007    Past Surgical History:  Procedure Laterality Date   ABDOMINAL AORTAGRAM N/A 03/10/2013   Procedure: ABDOMINAL Ronny Flurry;  Surgeon: Sherren Kerns, MD;  Location: [facility] CATH LAB;  Service: Cardiovascular;  Laterality: N/A;   ABDOMINAL AORTIC ENDOVASCULAR STENT GRAFT N/A 08/22/2012   Procedure: ABDOMINAL AORTIC ENDOVASCULAR STENT GRAFT;  Surgeon: Sherren Kerns, MD;  Location: [facility] OR;  Service: Vascular;  Laterality: N/A;   CAROTID ENDARTERECTOMY Left 2007   CAROTID STENT  2007   left internal carotid artery   TESTICLE TORSION REDUCTION     age of 80       Family History  Problem Relation Age of Onset   Cancer Mother        Breast cancer   Hypertension Mother    Diabetes Mother    Diabetes Father     Social History   Tobacco Use   Smoking status: Every Day    Packs/day: 1.00    Years: 60.00    Pack years: 60.00    Types: Cigarettes   Smokeless tobacco: Former    Types: Snuff   Tobacco comments:    4-5 cigs per day 08/16/20//lmr  Vaping Use   Vaping Use: Never used  Substance Use Topics   Alcohol use: No  Alcohol/week: 0.0 standard drinks   Drug use: No    Home Medications Prior to Admission medications   Medication Sig Start Date End Date Taking? Authorizing Provider  albuterol (PROVENTIL) (2.5 MG/3ML) 0.083% nebulizer solution USE 1 VIAL IN NEBULIZER EVERY 4 HOURS AS NEEDED FOR WHEEZING OR SHORTNESS OF BREATH 02/07/20   Kathyrn Drown, MD  apixaban (ELIQUIS) 5 MG TABS tablet Take 1 tablet (5 mg total) by mouth 2 (two) times daily. 10/17/20   Satira Sark, MD  Budeson-Glycopyrrol-Formoterol (BREZTRI AEROSPHERE) 160-9-4.8 MCG/ACT AERO Inhale 2 puffs into the lungs in the morning and  at bedtime. 07/24/20   Kathyrn Drown, MD  budesonide (PULMICORT) 0.5 MG/2ML nebulizer solution USE ONE VIAL IN NEBULIZER TWICE DAILY 07/24/20   Kathyrn Drown, MD  digoxin (LANOXIN) 0.125 MG tablet Take 1 tablet (0.125 mg total) by mouth daily. 02/27/20   Erlene Quan, PA-C  diltiazem (CARDIZEM CD) 300 MG 24 hr capsule Take 1 capsule (300 mg total) by mouth daily. 04/04/20   Verta Ellen., NP  doxycycline (VIBRA-TABS) 100 MG tablet Take 1 tablet (100 mg total) by mouth 2 (two) times daily for 7 days. 01/21/21 01/28/21  [employer]med[employer], Trenton Gammon, NP  furosemide (LASIX) 20 MG tablet Take 20 mg by mouth daily as needed. 02/26/20   [provider]  guaiFENesin (MUCINEX) 600 MG 12 hr tablet Take 1 tablet (600 mg total) by mouth 2 (two) times daily. Patient taking differently: Take 600 mg by mouth as needed. 09/19/14   Kathie Dike, MD  mometasone (ELOCON) 0.1 % cream Apply BID PRN 09/26/19   Kathyrn Drown, MD  predniSONE (DELTASONE) 10 MG tablet Take  4 each am x 2 days,   2 each am x 2 days,  1 each am x 2 days and stop 01/21/21   [employer]med[employer], Trenton Gammon, NP  PROAIR HFA 108 (90 Base) MCG/ACT inhaler INHALE 2 PUFFS BY MOUTH EVERY 6 HOURS AS NEEDED FOR WHEEZING FOR SHORTNESS OF BREATH 07/19/20   Kathyrn Drown, MD  rosuvastatin (CRESTOR) 5 MG tablet Take 1 tablet (5 mg total) by mouth daily. 07/24/20   Kathyrn Drown, MD    Allergies    Patient has no known allergies.  Review of Systems   Review of Systems  Cardiovascular:  Positive for chest pain.  All other systems reviewed and are negative.  Physical Exam Updated Vital Signs BP 104/60   Pulse 80   Temp 97.9 F (36.6 C) (Oral)   Resp (!) 23   SpO2 94%   Physical Exam Vitals and nursing note reviewed.  Constitutional:      Appearance: He is well-developed.  HENT:     Head: Normocephalic and atraumatic.     Mouth/Throat:     Mouth: Mucous membranes are moist.     Pharynx: Oropharynx is clear.  Eyes:     Pupils: Pupils are equal,  round, and reactive to light.  Cardiovascular:     Rate and Rhythm: Tachycardia present.  Pulmonary:     Effort: Pulmonary effort is normal. No respiratory distress.     Breath sounds: Decreased breath sounds and wheezing present.  Abdominal:     General: There is no distension.  Musculoskeletal:        General: Normal range of motion.     Cervical back: Normal range of motion.  Skin:    General: Skin is warm and dry.  Neurological:     General: No focal deficit present.  Mental Status: He is alert.    ED Results / Procedures / Treatments   Labs (all labs ordered are listed, but only abnormal results are displayed) Labs Reviewed  BASIC METABOLIC PANEL - Abnormal; Notable for the following components:      Result Value   Glucose, Bld 104 (*)    BUN 25 (*)    Creatinine, Ser 1.41 (*)    Calcium 7.9 (*)    GFR, Estimated 52 (*)    All other components within normal limits  CBC - Abnormal; Notable for the following components:   WBC 16.2 (*)    RBC 3.39 (*)    Hemoglobin 10.6 (*)    HCT 34.3 (*)    MCV 101.2 (*)    All other components within normal limits  BRAIN NATRIURETIC PEPTIDE - Abnormal; Notable for the following components:   B Natriuretic Peptide 370.0 (*)    All other components within normal limits  HEPATIC FUNCTION PANEL - Abnormal; Notable for the following components:   Total Protein 5.6 (*)    Albumin 3.3 (*)    Total Bilirubin 0.1 (*)    Indirect Bilirubin 0.0 (*)    All other components within normal limits  TROPONIN I (HIGH SENSITIVITY) - Abnormal; Notable for the following components:   Troponin I (High Sensitivity) 47 (*)    All other components within normal limits  TROPONIN I (HIGH SENSITIVITY) - Abnormal; Notable for the following components:   Troponin I (High Sensitivity) 56 (*)    All other components within normal limits  DIGOXIN LEVEL    EKG EKG Interpretation  Date/Time:  Tuesday January 28 2021 01:33:20 EST Ventricular Rate:   130 PR Interval:  143 QRS Duration: 77 QT Interval:  365 QTC Calculation: 537 R Axis:   -29 Text Interpretation: Sinus tachycardia Probable left atrial enlargement Borderline left axis deviation Probable anterior infarct, age indeterminate Prolonged QT interval Baseline wander in lead(s) V2 Since last tracing of earlier today QT has lengthened Confirmed by Daleen Bo [phone]) on 01/28/2021 1:53:16 AM  Radiology DG Chest 2 View  Result Date: 01/28/2021 CLINICAL DATA:  Chest pain EXAM: CHEST - 2 VIEW COMPARISON:  CT 07/09/2020 FINDINGS: Left-sided volume loss, peripheral mid lung parenchymal scarring, and laterally loculated pleural effusion within the left hemithorax is unchanged. Interstitial coarsening is again noted. No superimposed focal pulmonary infiltrate or nodule. Mild right apical pleuroparenchymal scarring again noted. No pneumothorax. No pleural effusion on the right. Cardiac size within normal limits. No acute bone abnormality. IMPRESSION: Stable parenchymal scarring and laterally loculated left-sided pleural effusion. No acute abnormality. Electronically Signed   By: Fidela Salisbury M.D.   On: 01/28/2021 00:58   CT Angio Chest PE W and/or Wo Contrast  Result Date: 01/28/2021 CLINICAL DATA:  Chest pain and shortness of breath for 1 hour EXAM: CT ANGIOGRAPHY CHEST WITH CONTRAST TECHNIQUE: Multidetector CT imaging of the chest was performed using the standard protocol during bolus administration of intravenous contrast. Multiplanar CT image reconstructions and MIPs were obtained to evaluate the vascular anatomy. CONTRAST:  55mL OMNIPAQUE IOHEXOL 350 MG/ML SOLN COMPARISON:  Chest x-ray from earlier in the same day, PET-CT from 07/29/2020. FINDINGS: Cardiovascular: Diffuse atherosclerotic calcifications of the thoracic aorta and its branches are noted. No aneurysmal dilatation or dissection is noted. Heart is mildly enlarged in size. Pulmonary artery shows a normal branching pattern. No fill  coil filling defect to suggest pulmonary embolism is noted. Coronary calcifications are noted. Mediastinum/Nodes: Thoracic inlet is within normal  limits. No sizable hilar or mediastinal adenopathy is noted. The esophagus as visualized is within normal limits. Lungs/Pleura: Emphysematous changes are identified particularly in the upper lobes bilaterally. The right lung is well aerated without focal infiltrate or effusion. Mild bronchial wall thickening is noted particularly in the lower lobe similar to that seen on prior PET-CT. Mild atelectatic changes are noted in the right lower lobe. Loculated pleural effusion is noted on the left stable in appearance from the prior PET-CT. Lingular somewhat bilobed nodular density is again identified similar to that seen on prior PET-CT. No new nodules are seen. Bronchial thickening is noted in the lower lobe on the left as well Upper Abdomen: Visualized upper abdomen is within normal limits. Musculoskeletal: Degenerative changes of the thoracic spine are seen. No acute rib abnormality is noted. No compression deformity is seen. Review of the MIP images confirms the above findings. IMPRESSION: No evidence of pulmonary emboli. Loculated left pleural effusion stable in appearance from prior PET-CT. Lingular somewhat bilobed nodular density is again identified similar to that seen on prior PET-CT. Bronchial thickening in the lower lobes bilaterally with mild atelectatic changes. Aortic Atherosclerosis (ICD10-I70.0) and Emphysema (ICD10-J43.9). Electronically Signed   By: Alcide Clever M.D.   On: 01/28/2021 03:59    Procedures Procedures   Medications Ordered in ED Medications  levalbuterol (XOPENEX) nebulizer solution 1.25 mg (1.25 mg Nebulization Given 01/28/21 0259)  lactated ringers bolus 1,000 mL (0 mLs Intravenous Stopped 01/28/21 0422)  iohexol (OMNIPAQUE) 350 MG/ML injection 75 mL (75 mLs Intravenous Contrast Given 01/28/21 0325)  diltiazem (CARDIZEM) injection 20 mg  (20 mg Intravenous Given 01/28/21 0419)    ED Course  I have reviewed the triage vital signs and the nursing notes.  Pertinent labs & imaging results that were available during my care of the patient were reviewed by me and considered in my medical decision making (see chart for details).    MDM Rules/Calculators/A&P                         Initially appeared to be sinus tachycardia however did not seem to get better with fluids and rest.  Xopenex did not change anything either.  The monitor started to look more like SVT however with his shortness of breath and mildly low oxygen got a PE scan to ensure this was not a blood clot, pneumonia or other new abnormality.  This was unchanged from previous.  As he states this felt like previous episodes of SVT I gave him a dose of diltiazem.  This almost immediately converted him into a sinus rhythm in the 70-80 range.  He held there for over an hour and had continued significant improvement in his symptoms.  He requested discharge.  I felt like this was okay. Suggested follow up with Dr. Diona Browner  Final Clinical Impression(s) / ED Diagnoses Final diagnoses:  Tachycardia  Palpitations    Rx / DC Orders ED Discharge Orders     None        Aaron Boeh, Barbara Cower, MD 01/28/21 [phone]

## 2021-01-28 NOTE — ED Triage Notes (Signed)
Pov from home with cc SOB and chest pain that started an hour ago.  "Feels like my heart has gotten out of rhythm"  finished course of prednisone this am and will finish abx tomorrow. For an URI. Also has COPD

## 2021-01-30 ENCOUNTER — Other Ambulatory Visit: Payer: Self-pay

## 2021-01-30 ENCOUNTER — Ambulatory Visit (INDEPENDENT_AMBULATORY_CARE_PROVIDER_SITE_OTHER): Payer: PPO | Admitting: Cardiology

## 2021-01-30 ENCOUNTER — Encounter: Payer: Self-pay | Admitting: Cardiology

## 2021-01-30 ENCOUNTER — Telehealth: Payer: Self-pay | Admitting: *Deleted

## 2021-01-30 VITALS — BP 94/60 | HR 136 | Ht 67.0 in | Wt 163.8 lb

## 2021-01-30 DIAGNOSIS — I471 Supraventricular tachycardia: Secondary | ICD-10-CM

## 2021-01-30 DIAGNOSIS — I6523 Occlusion and stenosis of bilateral carotid arteries: Secondary | ICD-10-CM | POA: Diagnosis not present

## 2021-01-30 DIAGNOSIS — I484 Atypical atrial flutter: Secondary | ICD-10-CM

## 2021-01-30 MED ORDER — MULTAQ 400 MG PO TABS: 400.0000 mg | ORAL_TABLET | Freq: Two times a day (BID) | ORAL | 2 refills | Status: AC

## 2021-01-30 MED ORDER — DILTIAZEM HCL ER COATED BEADS 180 MG PO CP24: 180.0000 mg | ORAL_CAPSULE | Freq: Every day | ORAL | 1 refills | Status: AC

## 2021-01-30 MED ORDER — APIXABAN 5 MG PO TABS: 5.0000 mg | ORAL_TABLET | Freq: Two times a day (BID) | ORAL | 0 refills | Status: AC

## 2021-01-30 NOTE — Telephone Encounter (Signed)
Advised that digoxin was discontinued today

## 2021-01-30 NOTE — Patient Instructions (Addendum)
Medication Instructions:  Your physician has recommended you make the following change in your medication:  Stop digoxin Decrease diltiazem to 180 mg daily Start multaq 400 mg twice daily Continue other medications the same  Labwork: none  Testing/Procedures: none  Follow-Up: Your physician recommends that you schedule a follow-up appointment in: 1 month  Any Other Special Instructions Will Be Listed Below (If Applicable).  If you need a refill on your cardiac medications before your next appointment, please call your pharmacy.

## 2021-01-30 NOTE — Progress Notes (Signed)
Cardiology Office Note  Date: 01/30/2021   ID: Blake Cherry, DOB 1945/02/24, MRN [id number]  PCP:  Blake Sciara, MD  Cardiologist:  Blake Dell, MD Electrophysiologist:  None     History of Present Illness: Blake Cherry is a 75 y.o. male last seen in August.  He presents for a follow-up visit.  Per chart review he was just recently seen in the ER at [facility] on December 6 with chest discomfort and palpitations.  He was felt to be in sinus tachycardia, however my review of the ECG from that encounter is more consistent with atypical atrial flutter or atrial tachycardia with 2:1 block.  I see that he was given intravenous diltiazem and it looks like he converted to sinus rhythm thereafter.  He has a known history of paroxysmal atrial fibrillation/flutter.  He is here today with his wife.  States that he once again has developed a sense of palpitations and is in fact back in the same rhythm that he presented with the other day, looks to be atypical atrial flutter or ectopic atrial tachycardia with 2:1 block.  He reports compliance with his medications.  Today we discussed antiarrhythmic therapy.  He does have COPD.  LVEF was 60 to 65% by assessment earlier this year.  We discussed initiation of Multaq with concurrent discontinuation of digoxin and decrease in Cardizem CD dose 180 mg daily.  He remains on Eliquis for stroke prophylaxis.  Past Medical History:  Diagnosis Date   AAA (abdominal aortic aneurysm)    Endovascular stent repair 2014   Atrial fibrillation and flutter (HCC)    Carotid artery disease (HCC) 2007   Left CEA   CKD (chronic kidney disease), stage III (HCC)    COPD (chronic obstructive pulmonary disease) (HCC)    Diverticulitis    Essential hypertension    Stroke (HCC) 2007   SVT (supraventricular tachycardia) (HCC)     Past Surgical History:  Procedure Laterality Date   ABDOMINAL AORTAGRAM N/A 03/10/2013   Procedure: ABDOMINAL Ronny Flurry;  Surgeon:  Blake Kerns, MD;  Location: [facility] CATH LAB;  Service: Cardiovascular;  Laterality: N/A;   ABDOMINAL AORTIC ENDOVASCULAR STENT GRAFT N/A 08/22/2012   Procedure: ABDOMINAL AORTIC ENDOVASCULAR STENT GRAFT;  Surgeon: Blake Kerns, MD;  Location: [facility] OR;  Service: Vascular;  Laterality: N/A;   CAROTID ENDARTERECTOMY Left 2007   CAROTID STENT  2007   left internal carotid artery   TESTICLE TORSION REDUCTION     age of 47    Current Outpatient Medications  Medication Sig Dispense Refill   albuterol (PROVENTIL) (2.5 MG/3ML) 0.083% nebulizer solution USE 1 VIAL IN NEBULIZER EVERY 4 HOURS AS NEEDED FOR WHEEZING OR SHORTNESS OF BREATH 450 mL 5   Budeson-Glycopyrrol-Formoterol (BREZTRI AEROSPHERE) 160-9-4.8 MCG/ACT AERO Inhale 2 puffs into the lungs in the morning and at bedtime. 10.7 g 4   budesonide (PULMICORT) 0.5 MG/2ML nebulizer solution USE ONE VIAL IN NEBULIZER TWICE DAILY 120 mL 11   diltiazem (CARDIZEM CD) 180 MG 24 hr capsule Take 1 capsule (180 mg total) by mouth daily. 90 capsule 1   dronedarone (MULTAQ) 400 MG tablet Take 1 tablet (400 mg total) by mouth 2 (two) times daily with a meal. 60 tablet 2   furosemide (LASIX) 20 MG tablet Take 20 mg by mouth daily as needed.     guaiFENesin (MUCINEX) 600 MG 12 hr tablet Take 1 tablet (600 mg total) by mouth 2 (two) times daily. (Patient taking differently: Take  600 mg by mouth as needed.) 20 tablet 0   mometasone (ELOCON) 0.1 % cream Apply BID PRN 45 g 1   predniSONE (DELTASONE) 10 MG tablet Take  4 each am x 2 days,   2 each am x 2 days,  1 each am x 2 days and stop 14 tablet 1   PROAIR HFA 108 (90 Base) MCG/ACT inhaler INHALE 2 PUFFS BY MOUTH EVERY 6 HOURS AS NEEDED FOR WHEEZING FOR SHORTNESS OF BREATH 9 g 3   rosuvastatin (CRESTOR) 5 MG tablet Take 1 tablet (5 mg total) by mouth daily. 90 tablet 3   apixaban (ELIQUIS) 5 MG TABS tablet Take 1 tablet (5 mg total) by mouth 2 (two) times daily. 56 tablet 0   No current facility-administered  medications for this visit.   Allergies:  Patient has no known allergies.   Social History: The patient  reports that he has been smoking cigarettes. He has a 60.00 pack-year smoking history. He has quit using smokeless tobacco.  His smokeless tobacco use included snuff. He reports that he does not drink alcohol and does not use drugs.   Family History: The patient's family history includes Cancer in his mother; Diabetes in his father and mother; Hypertension in his mother.   ROS: No syncope.  Physical Exam: VS:  BP 94/60   Pulse (!) 136   Ht 5\' 7"  (1.702 m)   Wt 163 lb 12.8 oz (74.3 kg)   SpO2 94%   BMI 25.65 kg/m , BMI Body mass index is 25.65 kg/m.  Wt Readings from Last 3 Encounters:  01/30/21 163 lb 12.8 oz (74.3 kg)  01/21/21 172 lb 3.2 oz (78.1 kg)  11/25/20 168 lb (76.2 kg)    General: Patient appears comfortable at rest. HEENT: Conjunctiva and lids normal, wearing a mask. Neck: Supple, no elevated JVP or carotid bruits, no thyromegaly. Lungs: Mild expiratory wheeze, nonlabored. Cardiac: Rapid regular rhythm, no S3 or significant systolic murmur, no pericardial rub. Abdomen: Soft, nontender, bowel sounds present. Extremities: No pitting edema, distal pulses 2+. Skin: Warm and dry. Musculoskeletal: No kyphosis. Neuropsychiatric: Alert and oriented x3, affect grossly appropriate.  ECG:  An ECG dated 01/28/2021 was personally reviewed today and demonstrated:  Probable atrial tachycardia or atypical atrial flutter with 2:1 block.  Recent Labwork: 02/08/2020: TSH 3.400 01/28/2021: ALT 10; AST 17; B Natriuretic Peptide 370.0; BUN 25; Creatinine, Ser 1.41; Hemoglobin 10.6; Platelets 358; Potassium 4.5; Sodium 140     Component Value Date/Time   CHOL 91 (L) 11/21/2020 0823   TRIG 71 11/21/2020 0823   HDL 55 11/21/2020 0823   CHOLHDL 1.7 11/21/2020 0823   CHOLHDL 2.9 02/08/2020 1456   VLDL 13 02/08/2020 1456   LDLCALC 21 11/21/2020 0823    Other Studies Reviewed  Today:  Blake Cherry 11/04/2017: There was no ST segment deviation noted during stress. The study is normal. There are no perfusion defects consistent with prior infarct or current ischemia. This is a low risk study. The left ventricular ejection fraction is hyperdynamic (>65%).   Echocardiogram 04/02/2020:  1. Left ventricular ejection fraction, by estimation, is 60 to 65%. The  left ventricle has normal function. The left ventricle has no regional  wall motion abnormalities. Left ventricular diastolic parameters are  indeterminate.   2. Right ventricular systolic function is normal. The right ventricular  size is normal.   3. Left atrial size was moderately dilated.   4. The mitral valve is normal in structure. Trivial mitral valve  regurgitation. No evidence of mitral stenosis.   5. The aortic valve is tricuspid. Aortic valve regurgitation is not  visualized. No aortic stenosis is present.   6. The inferior vena cava is normal in size with <50% respiratory  variability, suggesting right atrial pressure of 8 mmHg.   Carotid Dopplers 11/06/2020: Summary:  Right Carotid: Velocities in the right ICA are consistent with a 1-39%  stenosis.                 Non-hemodynamically significant plaque <50% noted in the  CCA. The                 ECA appears <50% stenosed.   Left Carotid: Velocities in the left ICA are consistent with a 40-59%  stenosis.                Non-hemodynamically significant plaque <50% noted in the  CCA. The                ECA appears <50% stenosed.   Vertebrals:  Bilateral vertebral arteries demonstrate antegrade flow.  Subclavians: Normal flow hemodynamics were seen in bilateral subclavian               arteries.   Assessment and Plan:  1.  Paroxysmal atrial fibrillation/atypical atrial flutter as discussed above.  CHA2DS2-VASc score is 5.  He has had two recurrent events within the last week.  Plan is to initiate Multaq 400 mg twice daily, stop digoxin, and  decrease Cardizem CD to 180 mg daily.  Continue Eliquis for stroke prophylaxis.  If this does not provide adequate rhythm control we will get him evaluated in the atrial fibrillation clinic.  2.  Nonobstructive carotid artery disease with history of left CEA and left carotid stent intervention.  Dopplers from September are noted above.  He is on statin therapy with last LDL 21 not on aspirin given use of Eliquis.  Medication Adjustments/Labs and Tests Ordered: Current medicines are reviewed at length with the patient today.  Concerns regarding medicines are outlined above.   Tests Ordered: Orders Placed This Encounter  Procedures   EKG 12-Lead    Medication Changes: Meds ordered this encounter  Medications   diltiazem (CARDIZEM CD) 180 MG 24 hr capsule    Sig: Take 1 capsule (180 mg total) by mouth daily.    Dispense:  90 capsule    Refill:  1    01/30/2021 dose decrease   dronedarone (MULTAQ) 400 MG tablet    Sig: Take 1 tablet (400 mg total) by mouth 2 (two) times daily with a meal.    Dispense:  60 tablet    Refill:  2    01/30/2021 NEW   apixaban (ELIQUIS) 5 MG TABS tablet    Sig: Take 1 tablet (5 mg total) by mouth 2 (two) times daily.    Dispense:  56 tablet    Refill:  0    Lot # [id number] Exp 11//2024    Disposition:  Follow up  1 month.  Signed, Satira Sark, MD, [facility] 01/30/2021 4:14 PM    [facility], [place], [place] [address] Phone: [phone]; Fax: [phone]

## 2021-02-03 ENCOUNTER — Telehealth: Payer: Self-pay | Admitting: Internal Medicine

## 2021-02-03 DIAGNOSIS — R918 Other nonspecific abnormal finding of lung field: Secondary | ICD-10-CM

## 2021-02-03 NOTE — Telephone Encounter (Signed)
I spoke with pt. He states that he had a CT Angio Chest on 01/28/21 and the Dr at the hospital told him that he wouldn't need the Super D Chest Ct that he has scheduled for 02/11/21. Please advise if you want pt to have this scan or not.

## 2021-02-03 NOTE — Telephone Encounter (Signed)
He's right so I'll talk to him about this at f/u ov but needs   Super D chest cT in 07/29/21 now

## 2021-02-04 NOTE — Telephone Encounter (Signed)
Spoke with Blake Cherry and advised per Dr Sherene Sires that we are cancelling the CT scheduled for 02/11/21. Blake Cherry verbalized understanding and is aware to keep appt with Dr Sherene Sires on 02/20/21.

## 2021-02-11 ENCOUNTER — Ambulatory Visit: Payer: PPO | Admitting: Nurse Practitioner

## 2021-02-11 ENCOUNTER — Other Ambulatory Visit: Payer: Self-pay

## 2021-02-11 ENCOUNTER — Other Ambulatory Visit (HOSPITAL_COMMUNITY): Payer: PPO

## 2021-02-11 ENCOUNTER — Encounter (HOSPITAL_COMMUNITY): Payer: Self-pay

## 2021-02-11 ENCOUNTER — Emergency Department (HOSPITAL_COMMUNITY): Payer: PPO

## 2021-02-11 ENCOUNTER — Inpatient Hospital Stay (HOSPITAL_COMMUNITY)
Admission: EM | Admit: 2021-02-11 | Discharge: 2021-02-19 | DRG: 193 | Disposition: A | Discharge status: Home Health | Payer: PPO | Attending: Internal Medicine | Admitting: Internal Medicine

## 2021-02-11 ENCOUNTER — Observation Stay (HOSPITAL_COMMUNITY): Payer: PPO

## 2021-02-11 DIAGNOSIS — Z7901 Long term (current) use of anticoagulants: Secondary | ICD-10-CM | POA: Diagnosis not present

## 2021-02-11 DIAGNOSIS — I482 Chronic atrial fibrillation, unspecified: Secondary | ICD-10-CM | POA: Diagnosis present

## 2021-02-11 DIAGNOSIS — J9601 Acute respiratory failure with hypoxia: Secondary | ICD-10-CM

## 2021-02-11 DIAGNOSIS — J440 Chronic obstructive pulmonary disease with acute lower respiratory infection: Secondary | ICD-10-CM | POA: Diagnosis not present

## 2021-02-11 DIAGNOSIS — J441 Chronic obstructive pulmonary disease with (acute) exacerbation: Secondary | ICD-10-CM | POA: Diagnosis present

## 2021-02-11 DIAGNOSIS — N1832 Chronic kidney disease, stage 3b: Secondary | ICD-10-CM | POA: Diagnosis present

## 2021-02-11 DIAGNOSIS — J9620 Acute and chronic respiratory failure, unspecified whether with hypoxia or hypercapnia: Secondary | ICD-10-CM

## 2021-02-11 DIAGNOSIS — I4892 Unspecified atrial flutter: Secondary | ICD-10-CM | POA: Diagnosis not present

## 2021-02-11 DIAGNOSIS — Z833 Family history of diabetes mellitus: Secondary | ICD-10-CM | POA: Diagnosis not present

## 2021-02-11 DIAGNOSIS — I10 Essential (primary) hypertension: Secondary | ICD-10-CM | POA: Diagnosis present

## 2021-02-11 DIAGNOSIS — Z803 Family history of malignant neoplasm of breast: Secondary | ICD-10-CM | POA: Diagnosis not present

## 2021-02-11 DIAGNOSIS — Z8249 Family history of ischemic heart disease and other diseases of the circulatory system: Secondary | ICD-10-CM | POA: Diagnosis not present

## 2021-02-11 DIAGNOSIS — R059 Cough, unspecified: Secondary | ICD-10-CM | POA: Diagnosis not present

## 2021-02-11 DIAGNOSIS — D509 Iron deficiency anemia, unspecified: Secondary | ICD-10-CM | POA: Diagnosis present

## 2021-02-11 DIAGNOSIS — R0602 Shortness of breath: Secondary | ICD-10-CM

## 2021-02-11 DIAGNOSIS — I129 Hypertensive chronic kidney disease with stage 1 through stage 4 chronic kidney disease, or unspecified chronic kidney disease: Secondary | ICD-10-CM | POA: Diagnosis not present

## 2021-02-11 DIAGNOSIS — Z8679 Personal history of other diseases of the circulatory system: Secondary | ICD-10-CM

## 2021-02-11 DIAGNOSIS — J449 Chronic obstructive pulmonary disease, unspecified: Secondary | ICD-10-CM | POA: Diagnosis present

## 2021-02-11 DIAGNOSIS — J9811 Atelectasis: Secondary | ICD-10-CM | POA: Diagnosis not present

## 2021-02-11 DIAGNOSIS — N183 Chronic kidney disease, stage 3 unspecified: Secondary | ICD-10-CM | POA: Diagnosis not present

## 2021-02-11 DIAGNOSIS — R0600 Dyspnea, unspecified: Secondary | ICD-10-CM | POA: Diagnosis not present

## 2021-02-11 DIAGNOSIS — I2721 Secondary pulmonary arterial hypertension: Secondary | ICD-10-CM | POA: Diagnosis not present

## 2021-02-11 DIAGNOSIS — J90 Pleural effusion, not elsewhere classified: Secondary | ICD-10-CM | POA: Diagnosis not present

## 2021-02-11 DIAGNOSIS — Z77090 Contact with and (suspected) exposure to asbestos: Secondary | ICD-10-CM | POA: Diagnosis not present

## 2021-02-11 DIAGNOSIS — Z8673 Personal history of transient ischemic attack (TIA), and cerebral infarction without residual deficits: Secondary | ICD-10-CM | POA: Diagnosis not present

## 2021-02-11 DIAGNOSIS — F17210 Nicotine dependence, cigarettes, uncomplicated: Secondary | ICD-10-CM | POA: Diagnosis not present

## 2021-02-11 DIAGNOSIS — J189 Pneumonia, unspecified organism: Secondary | ICD-10-CM | POA: Diagnosis not present

## 2021-02-11 DIAGNOSIS — Z20822 Contact with and (suspected) exposure to covid-19: Secondary | ICD-10-CM | POA: Diagnosis present

## 2021-02-11 DIAGNOSIS — J9621 Acute and chronic respiratory failure with hypoxia: Secondary | ICD-10-CM | POA: Diagnosis not present

## 2021-02-11 DIAGNOSIS — D649 Anemia, unspecified: Secondary | ICD-10-CM

## 2021-02-11 DIAGNOSIS — J471 Bronchiectasis with (acute) exacerbation: Secondary | ICD-10-CM | POA: Diagnosis not present

## 2021-02-11 DIAGNOSIS — I959 Hypotension, unspecified: Secondary | ICD-10-CM | POA: Diagnosis not present

## 2021-02-11 DIAGNOSIS — R079 Chest pain, unspecified: Secondary | ICD-10-CM | POA: Diagnosis not present

## 2021-02-11 DIAGNOSIS — D62 Acute posthemorrhagic anemia: Secondary | ICD-10-CM | POA: Diagnosis not present

## 2021-02-11 DIAGNOSIS — J948 Other specified pleural conditions: Secondary | ICD-10-CM | POA: Diagnosis not present

## 2021-02-11 DIAGNOSIS — J9690 Respiratory failure, unspecified, unspecified whether with hypoxia or hypercapnia: Secondary | ICD-10-CM | POA: Diagnosis not present

## 2021-02-11 DIAGNOSIS — E538 Deficiency of other specified B group vitamins: Secondary | ICD-10-CM | POA: Diagnosis not present

## 2021-02-11 DIAGNOSIS — Z79899 Other long term (current) drug therapy: Secondary | ICD-10-CM

## 2021-02-11 DIAGNOSIS — R062 Wheezing: Secondary | ICD-10-CM | POA: Diagnosis not present

## 2021-02-11 DIAGNOSIS — R0902 Hypoxemia: Secondary | ICD-10-CM | POA: Diagnosis not present

## 2021-02-11 DIAGNOSIS — Z9889 Other specified postprocedural states: Secondary | ICD-10-CM

## 2021-02-11 DIAGNOSIS — I517 Cardiomegaly: Secondary | ICD-10-CM | POA: Diagnosis not present

## 2021-02-11 DIAGNOSIS — Z7982 Long term (current) use of aspirin: Secondary | ICD-10-CM

## 2021-02-11 DIAGNOSIS — R069 Unspecified abnormalities of breathing: Secondary | ICD-10-CM | POA: Diagnosis not present

## 2021-02-11 LAB — URINALYSIS, ROUTINE W REFLEX MICROSCOPIC
Bilirubin Urine: NEGATIVE
Glucose, UA: NEGATIVE mg/dL
Hgb urine dipstick: NEGATIVE
Ketones, ur: NEGATIVE mg/dL
Leukocytes,Ua: NEGATIVE
Nitrite: NEGATIVE
Protein, ur: NEGATIVE mg/dL
Specific Gravity, Urine: 1.02 (ref 1.005–1.030)
pH: 5.5 (ref 5.0–8.0)

## 2021-02-11 LAB — COMPREHENSIVE METABOLIC PANEL
ALT: 8 U/L (ref 0–44)
AST: 17 U/L (ref 15–41)
Albumin: 2.9 g/dL — ABNORMAL LOW (ref 3.5–5.0)
Alkaline Phosphatase: 57 U/L (ref 38–126)
Anion gap: 5 (ref 5–15)
BUN: 32 mg/dL — ABNORMAL HIGH (ref 8–23)
CO2: 28 mmol/L (ref 22–32)
Calcium: 8.2 mg/dL — ABNORMAL LOW (ref 8.9–10.3)
Chloride: 105 mmol/L (ref 98–111)
Creatinine, Ser: 1.46 mg/dL — ABNORMAL HIGH (ref 0.61–1.24)
GFR, Estimated: 50 mL/min — ABNORMAL LOW (ref >60)
Glucose, Bld: 93 mg/dL (ref 70–99)
Potassium: 5.1 mmol/L (ref 3.5–5.1)
Sodium: 138 mmol/L (ref 135–145)
Total Bilirubin: 0.4 mg/dL (ref 0.3–1.2)
Total Protein: 6.3 g/dL — ABNORMAL LOW (ref 6.5–8.1)

## 2021-02-11 LAB — CBC WITH DIFFERENTIAL/PLATELET
Abs Immature Granulocytes: 0.04 10*3/uL (ref 0.00–0.07)
Basophils Absolute: 0.1 10*3/uL (ref 0.0–0.1)
Basophils Relative: 1 %
Eosinophils Absolute: 0.6 10*3/uL — ABNORMAL HIGH (ref 0.0–0.5)
Eosinophils Relative: 5 %
HCT: 26.6 % — ABNORMAL LOW (ref 39.0–52.0)
Hemoglobin: 7.8 g/dL — ABNORMAL LOW (ref 13.0–17.0)
Immature Granulocytes: 0 %
Lymphocytes Relative: 11 %
Lymphs Abs: 1.2 10*3/uL (ref 0.7–4.0)
MCH: 30.1 pg (ref 26.0–34.0)
MCHC: 29.3 g/dL — ABNORMAL LOW (ref 30.0–36.0)
MCV: 102.7 fL — ABNORMAL HIGH (ref 80.0–100.0)
Monocytes Absolute: 1.2 10*3/uL — ABNORMAL HIGH (ref 0.1–1.0)
Monocytes Relative: 11 %
Neutro Abs: 7.8 10*3/uL — ABNORMAL HIGH (ref 1.7–7.7)
Neutrophils Relative %: 72 %
Platelets: 354 10*3/uL (ref 150–400)
RBC: 2.59 MIL/uL — ABNORMAL LOW (ref 4.22–5.81)
RDW: 14.1 % (ref 11.5–15.5)
WBC: 10.9 10*3/uL — ABNORMAL HIGH (ref 4.0–10.5)
nRBC: 0 % (ref 0.0–0.2)

## 2021-02-11 LAB — BRAIN NATRIURETIC PEPTIDE: B Natriuretic Peptide: 376 pg/mL — ABNORMAL HIGH (ref 0.0–100.0)

## 2021-02-11 LAB — LACTIC ACID, PLASMA
Lactic Acid, Venous: 0.7 mmol/L (ref 0.5–1.9)
Lactic Acid, Venous: 1.3 mmol/L (ref 0.5–1.9)

## 2021-02-11 LAB — PROTIME-INR
INR: 1.2 (ref 0.8–1.2)
Prothrombin Time: 15.3 seconds — ABNORMAL HIGH (ref 11.4–15.2)

## 2021-02-11 LAB — PROCALCITONIN: Procalcitonin: <0.1 ng/mL

## 2021-02-11 LAB — RESP PANEL BY RT-PCR (FLU A&B, COVID) ARPGX2
Influenza A by PCR: NEGATIVE
Influenza B by PCR: NEGATIVE
SARS Coronavirus 2 by RT PCR: NEGATIVE

## 2021-02-11 LAB — APTT: aPTT: 39 seconds — ABNORMAL HIGH (ref 24–36)

## 2021-02-11 MED ORDER — IPRATROPIUM-ALBUTEROL 0.5-2.5 (3) MG/3ML IN SOLN
3.0000 mL | Freq: Three times a day (TID) | RESPIRATORY_TRACT | Status: DC
  Administered 2021-02-12 – 2021-02-17 (×17): 3 mL via RESPIRATORY_TRACT
  Filled 2021-02-11 (×17): qty 3

## 2021-02-11 MED ORDER — ACETAMINOPHEN 650 MG RE SUPP: 650.0000 mg | Freq: Four times a day (QID) | RECTAL | Status: DC | PRN

## 2021-02-11 MED ORDER — ACETAMINOPHEN 325 MG PO TABS
650.0000 mg | ORAL_TABLET | Freq: Four times a day (QID) | ORAL | Status: DC | PRN
  Administered 2021-02-19: 07:00:00 650 mg via ORAL
  Filled 2021-02-11: qty 2

## 2021-02-11 MED ORDER — SODIUM CHLORIDE 0.9 % IV SOLN
500.0000 mg | INTRAVENOUS | Status: DC
  Administered 2021-02-11 – 2021-02-13 (×3): 500 mg via INTRAVENOUS
  Filled 2021-02-11 (×3): qty 5

## 2021-02-11 MED ORDER — DRONEDARONE HCL 400 MG PO TABS
400.0000 mg | ORAL_TABLET | Freq: Two times a day (BID) | ORAL | Status: DC
  Administered 2021-02-12 – 2021-02-19 (×14): 400 mg via ORAL
  Filled 2021-02-11 (×19): qty 1

## 2021-02-11 MED ORDER — BUDESONIDE 0.5 MG/2ML IN SUSP
0.5000 mg | Freq: Two times a day (BID) | RESPIRATORY_TRACT | Status: DC
  Administered 2021-02-11 – 2021-02-19 (×16): 0.5 mg via RESPIRATORY_TRACT
  Filled 2021-02-11 (×15): qty 2

## 2021-02-11 MED ORDER — POLYETHYLENE GLYCOL 3350 17 G PO PACK: 17.0000 g | PACK | Freq: Every day | ORAL | Status: DC | PRN

## 2021-02-11 MED ORDER — ALBUTEROL SULFATE (2.5 MG/3ML) 0.083% IN NEBU: 2.5000 mg | INHALATION_SOLUTION | RESPIRATORY_TRACT | Status: DC | PRN

## 2021-02-11 MED ORDER — ROSUVASTATIN CALCIUM 10 MG PO TABS
5.0000 mg | ORAL_TABLET | Freq: Every day | ORAL | Status: DC
  Administered 2021-02-12 – 2021-02-19 (×8): 5 mg via ORAL
  Filled 2021-02-11 (×8): qty 1

## 2021-02-11 MED ORDER — ALBUTEROL SULFATE (2.5 MG/3ML) 0.083% IN NEBU
10.0000 mg | INHALATION_SOLUTION | Freq: Once | RESPIRATORY_TRACT | Status: AC
Start: 2021-02-11 — End: 2021-02-11
  Administered 2021-02-11: 12:00:00 10 mg via RESPIRATORY_TRACT
  Filled 2021-02-11: qty 12

## 2021-02-11 MED ORDER — ONDANSETRON HCL 4 MG/2ML IJ SOLN: 4.0000 mg | Freq: Four times a day (QID) | INTRAMUSCULAR | Status: DC | PRN

## 2021-02-11 MED ORDER — SODIUM CHLORIDE 0.9 % IV SOLN
2.0000 g | INTRAVENOUS | Status: DC
  Administered 2021-02-11: 13:00:00 2 g via INTRAVENOUS
  Filled 2021-02-11: qty 20

## 2021-02-11 MED ORDER — SODIUM CHLORIDE 0.9 % IV SOLN
2.0000 g | INTRAVENOUS | Status: AC
  Administered 2021-02-12 – 2021-02-16 (×5): 2 g via INTRAVENOUS
  Filled 2021-02-11 (×5): qty 20

## 2021-02-11 MED ORDER — PANTOPRAZOLE SODIUM 40 MG IV SOLR
80.0000 mg | Freq: Once | INTRAVENOUS | Status: AC
  Administered 2021-02-11: 16:00:00 80 mg via INTRAVENOUS
  Filled 2021-02-11: qty 80

## 2021-02-11 MED ORDER — LACTATED RINGERS IV SOLN: INTRAVENOUS | Status: DC

## 2021-02-11 MED ORDER — DILTIAZEM HCL ER COATED BEADS 180 MG PO CP24
180.0000 mg | ORAL_CAPSULE | Freq: Every day | ORAL | Status: DC
  Administered 2021-02-11 – 2021-02-19 (×8): 180 mg via ORAL
  Filled 2021-02-11 (×9): qty 1

## 2021-02-11 MED ORDER — ONDANSETRON HCL 4 MG PO TABS: 4.0000 mg | ORAL_TABLET | Freq: Four times a day (QID) | ORAL | Status: DC | PRN

## 2021-02-11 MED ORDER — PANTOPRAZOLE SODIUM 40 MG IV SOLR
40.0000 mg | Freq: Once | INTRAVENOUS | Status: DC
  Filled 2021-02-11: qty 40

## 2021-02-11 MED ORDER — IOHEXOL 350 MG/ML SOLN
100.0000 mL | Freq: Once | INTRAVENOUS | Status: AC | PRN
  Administered 2021-02-11: 19:00:00 75 mL via INTRAVENOUS

## 2021-02-11 MED ORDER — IPRATROPIUM-ALBUTEROL 0.5-2.5 (3) MG/3ML IN SOLN
3.0000 mL | Freq: Three times a day (TID) | RESPIRATORY_TRACT | Status: DC
  Administered 2021-02-11: 20:00:00 3 mL via RESPIRATORY_TRACT
  Filled 2021-02-11: qty 3

## 2021-02-11 NOTE — ED Notes (Signed)
Pt put on 4 liter 02 ok per RN

## 2021-02-11 NOTE — Sepsis Progress Note (Signed)
Sepsis protocol followed by eLink 

## 2021-02-11 NOTE — ED Notes (Signed)
Pt c/o sob and wet cough x 3 days. Denies pain. A/o. Color wnl. Pt now on 3L [place] at sats are 100%. Denies headache/n/v/d.

## 2021-02-11 NOTE — ED Triage Notes (Signed)
Pt via EMS from home for Shortness of breath for the past 2 to 3 days. Upon EMS arrival O2 in the 80s on room air.

## 2021-02-11 NOTE — ED Provider Notes (Signed)
[facility] EMERGENCY DEPARTMENT Provider Note   CSN: [id number] Arrival date & time: 02/11/21  1028     History Chief Complaint  Patient presents with   Shortness of Breath    Blake Cherry is a 75 y.o. male.  HPI    75 year old male with history of CKD, COPD, stroke, A. fib/flutter on Eliquis comes in with chief complaint of shortness of breath.  Patient reports that his been having some shortness of breath for the last 2 or 3 days.  When EMS arrived, they noted that his oxygen was in the 80s.  Patient's wife is at the bedside, reports that over the last few days they have been sharing oxygen.  Patient normally is not on any oxygen at home.  Patient has history of COPD, he reports having some mild nasal congestion and dry cough.  He has had episodes of palpitations.  Our records indicate that patient was seen in the ER on 12-6 with chest pain and had a CT scan which revealed some loculated fluid.  Patient subsequently has seen cardiology and was cleared by them.  Patient unsure if he is having any fevers.   Past Medical History:  Diagnosis Date   AAA (abdominal aortic aneurysm)    Endovascular stent repair 2014   Atrial fibrillation and flutter (HCC)    Carotid artery disease ([place]) 2007   Left CEA   CKD (chronic kidney disease), stage III ([place])    COPD (chronic obstructive pulmonary disease) ([place])    Diverticulitis    Essential hypertension    Stroke ([place]) 2007   SVT (supraventricular tachycardia) ([place])     Patient Active Problem List   Diagnosis Date Noted   Cigarette smoker 07/16/2020   Multiple pulmonary nodules 07/12/2020   Atrial flutter ([place]) 02/27/2020   Anticoagulated 02/27/2020   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 02/15/2020   Advanced nonexudative age-related macular degeneration of left eye with subfoveal involvement 02/15/2020   Chest pain of uncertain etiology  0000000   SVT (supraventricular tachycardia) ([place]) 10/03/2017   Leukocytosis 11/24/2016   COPD with acute exacerbation ([place]) 09/16/2014   AKI (acute kidney injury) ([place]) 09/16/2014   CRI (chronic renal insufficiency), stage 3 (moderate) ([place]) 09/16/2014   Hyperglycemia, drug-induced 09/16/2014   COPD severe GOLD III/ active smoker 07/18/2014   Obesity    Aftercare following surgery of the circulatory system, NEC 09/22/2012   Essential hypertension, benign 09/02/2012   Hyperlipidemia 09/02/2012   Tobacco abuse 09/02/2012   Abdominal aneurysm without mention of rupture 10/20/2011   Carotid artery disease ([place]) 2007    Past Surgical History:  Procedure Laterality Date   ABDOMINAL AORTAGRAM N/A 03/10/2013   Procedure: ABDOMINAL Maxcine Ham;  Surgeon: Elam Dutch, MD;  Location: [facility] CATH LAB;  Service: Cardiovascular;  Laterality: N/A;   ABDOMINAL AORTIC ENDOVASCULAR STENT GRAFT N/A 08/22/2012   Procedure: ABDOMINAL AORTIC ENDOVASCULAR STENT GRAFT;  Surgeon: Elam Dutch, MD;  Location: [facility] OR;  Service: Vascular;  Laterality: N/A;   CAROTID ENDARTERECTOMY Left 2007   CAROTID STENT  2007   left internal carotid artery   TESTICLE TORSION REDUCTION     age of 77       Family History  Problem Relation Age of Onset   Cancer Mother        Breast cancer   Hypertension Mother    Diabetes Mother    Diabetes Father     Social History   Tobacco Use  Smoking status: Every Day    Packs/day: 1.00    Years: 60.00    Pack years: 60.00    Types: Cigarettes   Smokeless tobacco: Former    Types: Snuff   Tobacco comments:    4-5 cigs per day 08/16/20//lmr  Vaping Use   Vaping Use: Never used  Substance Use Topics   Alcohol use: No    Alcohol/week: 0.0 standard drinks   Drug use: No    Home Medications Prior to Admission medications   Medication Sig Start Date End Date Taking? Authorizing Provider  albuterol (PROVENTIL) (2.5 MG/3ML) 0.083%  nebulizer solution USE 1 VIAL IN NEBULIZER EVERY 4 HOURS AS NEEDED FOR WHEEZING OR SHORTNESS OF BREATH 02/07/20  Yes Luking, Elayne Snare, MD  apixaban (ELIQUIS) 5 MG TABS tablet Take 1 tablet (5 mg total) by mouth 2 (two) times daily. 01/30/21  Yes Satira Sark, MD  budesonide (PULMICORT) 0.5 MG/2ML nebulizer solution USE ONE VIAL IN NEBULIZER TWICE DAILY 07/24/20  Yes Luking, Elayne Snare, MD  diltiazem (CARDIZEM CD) 180 MG 24 hr capsule Take 1 capsule (180 mg total) by mouth daily. 01/30/21  Yes Satira Sark, MD  dronedarone (MULTAQ) 400 MG tablet Take 1 tablet (400 mg total) by mouth 2 (two) times daily with a meal. 01/30/21  Yes Satira Sark, MD  guaiFENesin (MUCINEX) 600 MG 12 hr tablet Take 1 tablet (600 mg total) by mouth 2 (two) times daily. Patient taking differently: Take 600 mg by mouth as needed. 09/19/14  Yes Kathie Dike, MD  PROAIR HFA 108 (90 Base) MCG/ACT inhaler INHALE 2 PUFFS BY MOUTH EVERY 6 HOURS AS NEEDED FOR WHEEZING FOR SHORTNESS OF BREATH 07/19/20  Yes Luking, Elayne Snare, MD  rosuvastatin (CRESTOR) 5 MG tablet Take 1 tablet (5 mg total) by mouth daily. 07/24/20  Yes Luking, Elayne Snare, MD  Budeson-Glycopyrrol-Formoterol (BREZTRI AEROSPHERE) 160-9-4.8 MCG/ACT AERO Inhale 2 puffs into the lungs in the morning and at bedtime. Patient not taking: Reported on 02/11/2021 07/24/20   Kathyrn Drown, MD  furosemide (LASIX) 20 MG tablet Take 20 mg by mouth daily as needed. Patient not taking: Reported on 02/11/2021 02/26/20   [provider]  mometasone (ELOCON) 0.1 % cream Apply BID PRN Patient not taking: Reported on 02/11/2021 09/26/19   Kathyrn Drown, MD  predniSONE (DELTASONE) 10 MG tablet Take  4 each am x 2 days,   2 each am x 2 days,  1 each am x 2 days and stop Patient not taking: Reported on 02/11/2021 01/21/21   Ameduite, Trenton Gammon, NP    Allergies    Patient has no known allergies.  Review of Systems   Review of Systems  Constitutional:  Positive for activity  change.  HENT:  Positive for congestion.   Respiratory:  Positive for cough and shortness of breath.   Cardiovascular:  Negative for chest pain.  Gastrointestinal:  Negative for nausea and vomiting.  Allergic/Immunologic: Negative for immunocompromised state.  Hematological:  Bruises/bleeds easily.  All other systems reviewed and are negative.  Physical Exam Updated Vital Signs BP (!) 119/44    Pulse 73    Temp 98.3 F (36.8 C) (Oral)    Resp (!) 24    Ht 5\' 7"  (1.702 m)    Wt 73.9 kg    SpO2 100%    BMI 25.53 kg/m   Physical Exam Vitals and nursing note reviewed.  Constitutional:      Appearance: He is well-developed.  HENT:  Head: Atraumatic.  Cardiovascular:     Rate and Rhythm: Normal rate.  Pulmonary:     Effort: Pulmonary effort is normal.     Breath sounds: Wheezing and rhonchi present.  Musculoskeletal:     Cervical back: Neck supple.     Right lower leg: No edema.     Left lower leg: No edema.  Skin:    General: Skin is warm.  Neurological:     Mental Status: He is alert and oriented to person, place, and time.    ED Results / Procedures / Treatments   Labs (all labs ordered are listed, but only abnormal results are displayed) Labs Reviewed  PROTIME-INR - Abnormal; Notable for the following components:      Result Value   Prothrombin Time 15.3 (*)    All other components within normal limits  APTT - Abnormal; Notable for the following components:   aPTT 39 (*)    All other components within normal limits  RESP PANEL BY RT-PCR (FLU A&B, COVID) ARPGX2  CULTURE, BLOOD (ROUTINE X 2)  CULTURE, BLOOD (ROUTINE X 2)  URINALYSIS, ROUTINE W REFLEX MICROSCOPIC  BRAIN NATRIURETIC PEPTIDE  LACTIC ACID, PLASMA  LACTIC ACID, PLASMA  COMPREHENSIVE METABOLIC PANEL  CBC WITH DIFFERENTIAL/PLATELET  PROCALCITONIN    EKG EKG Interpretation  Date/Time:  Tuesday February 11 2021 10:50:11 EST Ventricular Rate:  65 PR Interval:  164 QRS Duration: 77 QT  Interval:  366 QTC Calculation: 381 R Axis:   -21 Text Interpretation: Sinus rhythm Borderline left axis deviation Borderline T abnormalities, inferior leads No acute changes Confirmed by Varney Biles [id number]) on 02/11/2021 11:25:57 AM  Radiology DG Chest Port 1 View  Result Date: 02/11/2021 CLINICAL DATA:  Dyspnea, COPD, smoker EXAM: PORTABLE CHEST 1 VIEW COMPARISON:  01/28/2021 chest radiograph. FINDINGS: Stable cardiomediastinal silhouette with top normal heart size. No pneumothorax. Loculated small to moderate lateral basilar left pleural effusion/thickening, mildly increased. Slight blunting of the right costophrenic angle. Cephalization of the pulmonary vasculature without overt pulmonary edema. Patchy and streaky opacities in the mid to lower left lung appear unchanged. IMPRESSION: 1. Loculated small to moderate lateral basilar left pleural effusion/thickening, mildly increased. 2. Chronic patchy and streaky opacities in the mid to lower left lung, favor nonspecific scarring. 3. Slight blunting of the right costophrenic angle, possible small right pleural effusion. Electronically Signed   By: Ilona Sorrel M.D.   On: 02/11/2021 11:26    Procedures .Critical Care Performed by: Varney Biles, MD Authorized by: Varney Biles, MD   Critical care provider statement:    Critical care time (minutes):  48   Critical care was necessary to treat or prevent imminent or life-threatening deterioration of the following conditions:  Respiratory failure and sepsis   Critical care was time spent personally by me on the following activities:  Development of treatment plan with patient or surrogate, discussions with consultants, evaluation of patient's response to treatment, examination of patient, ordering and review of laboratory studies, ordering and review of radiographic studies, ordering and performing treatments and interventions, pulse oximetry, re-evaluation of patient's condition and review of  old charts   Medications Ordered in ED Medications  lactated ringers infusion ( Intravenous New Bag/Given 02/11/21 1229)  cefTRIAXone (ROCEPHIN) 2 g in sodium chloride 0.9 % 100 mL IVPB (2 g Intravenous New Bag/Given 02/11/21 1232)  azithromycin (ZITHROMAX) 500 mg in sodium chloride 0.9 % 250 mL IVPB (has no administration in time range)  albuterol (PROVENTIL) (2.5 MG/3ML) 0.083% nebulizer solution 10 mg (  10 mg Nebulization Given 02/11/21 1146)    ED Course  I have reviewed the triage vital signs and the nursing notes.  Pertinent labs & imaging results that were available during my care of the patient were reviewed by me and considered in my medical decision making (see chart for details).  Clinical Course as of 02/11/21 1630  Tue Feb 11, 2021  1552 Hemoglobin(!): 7.8 Hemoglobin is dropped from over 12 last year to over 10 earlier this month to 7.8. And this discovery was made at the time of admission conversation with the hospitalist.  They will be happy to take over management and diagnostic work-up for anemia.  I will give IV Protonix.   I had paged pulmonary team to see if patient would be a good candidate for evaluation of loculated fluid [AN]    Clinical Course User Index [AN] Derwood Kaplan, MD   MDM Rules/Calculators/A&P                         75 year old male comes in with chief complaint of shortness of breath with associated cough  Symptoms have been present for about 3 days now.  He has acute hypoxic respiratory failure associated with his current symptoms.  I reviewed patient's recent work-up.  He has preserved EF with his A. fib and had a CT scan with contrast that did not show any PE but did indicate some loculated fluid.  X-rays today confirms worsening left-sided pleural effusion.  Differential diagnosis includes empyema, pleural effusion, viral pneumonia, bacterial pneumonia.  Based on the initial work-up, I suspect that he might have empyema/complicated  pneumonia.  We will start sepsis work-up at this time. Likely will need pulmonary consultation and admission to the hospital for new hypoxic respiratory failure     Final Clinical Impression(s) / ED Diagnoses Final diagnoses:  Acute hypoxemic respiratory failure [facility])    Rx / DC Orders ED Discharge Orders     None        Derwood Kaplan, MD 02/11/21 1245

## 2021-02-11 NOTE — ED Notes (Signed)
02 lead not picking up well changed 02 cord and placed on pt ear

## 2021-02-11 NOTE — H&P (Addendum)
History and Physical    Blake Cherry MPN:[id number] DOB: 03-17-1945 DOA: 02/11/2021  PCP: [name], MD   Patient coming from: Home  I have personally briefly reviewed patient's old medical records in [facility]  Chief Complaint: Difficulty breathing  HPI: Blake Cherry is a 75 y.o. male with medical history significant for COPD, atrial flutter, CKD 3, hypertension, stroke. Patient presented to the ED with complaints of difficulty breathing of 2 to 3 days.  Reports difficulty breathing at rest, that worsens with exertion.  Reports he is unable to lie back to sleep due to difficulty breathing.,  And sleeps sitting up.  He also reports a cough that is dry, and nasal congestion.  He has chronic cough that has not significantly changed from his baseline.  He denies chest pain.  No lower extremity swelling.  He is on Eliquis and reports compliance.  He denies black stools, no blood in stools.  No vomiting.  No abdominal pain.  He takes 400 mg of ibuprofen very occasionally, on the average 2-3 times a month.  Patient's wife is on home O2, over the past few days, he has had to share his wife's oxygen.  ED Course: Respiratory rate 13-26.  O2 sats down to 82% on room air, currently on 2-1/2 L sats 95 to 100%.  Temperature 98.3.  Heart rate 60s to 90s. Hemoglobin 7.8.  WBC 10.9.  Procalcitonin less than 0.1.  BNP 376. Portable chest x-ray shows loculated small to moderate lateral basilar left pleural effusion/thickening mildly increased from prior.  Also shows chronic patchy/streaky opacities favoring scarring. Hospitalist to admit for acute respiratory failure.  Review of Systems: As per HPI all other systems reviewed and negative.  Past Medical History:  Diagnosis Date   AAA (abdominal aortic aneurysm)    Endovascular stent repair 2014   Atrial fibrillation and flutter (HCC)    Carotid artery disease (HCC) 2007   Left CEA   CKD (chronic kidney disease), stage III (HCC)     COPD (chronic obstructive pulmonary disease) (HCC)    Diverticulitis    Essential hypertension    Stroke (HCC) 2007   SVT (supraventricular tachycardia) (HCC)     Past Surgical History:  Procedure Laterality Date   ABDOMINAL AORTAGRAM N/A 03/10/2013   Procedure: ABDOMINAL Ronny Flurry;  Surgeon: Sherren Kerns, MD;  Location: [facility] CATH LAB;  Service: Cardiovascular;  Laterality: N/A;   ABDOMINAL AORTIC ENDOVASCULAR STENT GRAFT N/A 08/22/2012   Procedure: ABDOMINAL AORTIC ENDOVASCULAR STENT GRAFT;  Surgeon: Sherren Kerns, MD;  Location: [facility] OR;  Service: Vascular;  Laterality: N/A;   CAROTID ENDARTERECTOMY Left 2007   CAROTID STENT  2007   left internal carotid artery   TESTICLE TORSION REDUCTION     age of 26     reports that he has been smoking cigarettes. He has a 60.00 pack-year smoking history. He has quit using smokeless tobacco.  His smokeless tobacco use included snuff. He reports that he does not drink alcohol and does not use drugs.  No Known Allergies  Family History  Problem Relation Age of Onset   Cancer Mother        Breast cancer   Hypertension Mother    Diabetes Mother    Diabetes Father     Prior to Admission medications   Medication Sig Start Date End Date Taking? Authorizing Provider  albuterol (PROVENTIL) (2.5 MG/3ML) 0.083% nebulizer solution USE 1 VIAL IN NEBULIZER EVERY 4 HOURS AS NEEDED FOR WHEEZING  OR SHORTNESS OF BREATH 02/07/20  Yes Luking, Elayne Snare, MD  apixaban (ELIQUIS) 5 MG TABS tablet Take 1 tablet (5 mg total) by mouth 2 (two) times daily. 01/30/21  Yes Satira Sark, MD  budesonide (PULMICORT) 0.5 MG/2ML nebulizer solution USE ONE VIAL IN NEBULIZER TWICE DAILY 07/24/20  Yes Luking, Elayne Snare, MD  diltiazem (CARDIZEM CD) 180 MG 24 hr capsule Take 1 capsule (180 mg total) by mouth daily. 01/30/21  Yes Satira Sark, MD  dronedarone (MULTAQ) 400 MG tablet Take 1 tablet (400 mg total) by mouth 2 (two) times daily with a meal. 01/30/21  Yes Satira Sark, MD  guaiFENesin (MUCINEX) 600 MG 12 hr tablet Take 1 tablet (600 mg total) by mouth 2 (two) times daily. Patient taking differently: Take 600 mg by mouth as needed. 09/19/14  Yes Kathie Dike, MD  PROAIR HFA 108 (90 Base) MCG/ACT inhaler INHALE 2 PUFFS BY MOUTH EVERY 6 HOURS AS NEEDED FOR WHEEZING FOR SHORTNESS OF BREATH 07/19/20  Yes Luking, Elayne Snare, MD  rosuvastatin (CRESTOR) 5 MG tablet Take 1 tablet (5 mg total) by mouth daily. 07/24/20  Yes Luking, Elayne Snare, MD  Budeson-Glycopyrrol-Formoterol (BREZTRI AEROSPHERE) 160-9-4.8 MCG/ACT AERO Inhale 2 puffs into the lungs in the morning and at bedtime. Patient not taking: Reported on 02/11/2021 07/24/20   Kathyrn Drown, MD  furosemide (LASIX) 20 MG tablet Take 20 mg by mouth daily as needed. Patient not taking: Reported on 02/11/2021 02/26/20   [provider]  mometasone (ELOCON) 0.1 % cream Apply BID PRN Patient not taking: Reported on 02/11/2021 09/26/19   Kathyrn Drown, MD  predniSONE (DELTASONE) 10 MG tablet Take  4 each am x 2 days,   2 each am x 2 days,  1 each am x 2 days and stop Patient not taking: Reported on 02/11/2021 01/21/21   Ameduite, Trenton Gammon, NP    Physical Exam: Vitals:   02/11/21 1500 02/11/21 1530 02/11/21 1623 02/11/21 1732  BP: (!) 111/53 (!) 117/47 (!) 107/48 (!) 122/56  Pulse: 85 80 83 87  Resp: 18 18 (!) 21   Temp:   98.5 F (36.9 C) 97.8 F (36.6 C)  TempSrc:   Oral Oral  SpO2: 95% 100% 95% 98%  Weight:      Height:        Constitutional: NAD, calm, comfortable Vitals:   02/11/21 1500 02/11/21 1530 02/11/21 1623 02/11/21 1732  BP: (!) 111/53 (!) 117/47 (!) 107/48 (!) 122/56  Pulse: 85 80 83 87  Resp: 18 18 (!) 21   Temp:   98.5 F (36.9 C) 97.8 F (36.6 C)  TempSrc:   Oral Oral  SpO2: 95% 100% 95% 98%  Weight:      Height:       Eyes: PERRL, lids and conjunctivae normal ENMT: Mucous membranes are moist  Neck: normal, supple, no masses, no thyromegaly Respiratory: Faint expiratory  wheezing bilaterally, normal work of breathing. Cardiovascular: Regular rate and rhythm, no murmurs / rubs / gallops. No extremity edema.  Lower extremities warm and well-perfused. Abdomen: no tenderness, no masses palpated. No hepatosplenomegaly. Bowel sounds positive.  Musculoskeletal: no clubbing / cyanosis. No joint deformity upper and lower extremities. Good ROM, no contractures. Normal muscle tone.  Skin: no rashes, lesions, ulcers. No induration Neurologic: No apparent cranial nerve abnormality, moving all extremities spontaneously. Psychiatric: Normal judgment and insight. Alert and oriented x 3. Normal mood.   Labs on Admission: I have personally reviewed following labs  and imaging studies  CBC: Recent Labs  Lab 02/11/21 1208  WBC 10.9*  NEUTROABS 7.8*  HGB 7.8*  HCT 26.6*  MCV 102.7*  PLT [id number]   Basic Metabolic Panel: Recent Labs  Lab 02/11/21 1208  NA 138  K 5.1  CL 105  CO2 28  GLUCOSE 93  BUN 32*  CREATININE 1.46*  CALCIUM 8.2*   GFR: Estimated Creatinine Clearance: 41.5 mL/min (A) (by C-G formula based on SCr of 1.46 mg/dL (H)). Liver Function Tests: Recent Labs  Lab 02/11/21 1208  AST 17  ALT 8  ALKPHOS 57  BILITOT 0.4  PROT 6.3*  ALBUMIN 2.9*   Coagulation Profile: Recent Labs  Lab 02/11/21 1208  INR 1.2    Urine analysis:    Component Value Date/Time   COLORURINE YELLOW 02/11/2021 1225   APPEARANCEUR CLEAR 02/11/2021 1225   LABSPEC 1.020 02/11/2021 1225   PHURINE 5.5 02/11/2021 1225   GLUCOSEU NEGATIVE 02/11/2021 1225   HGBUR NEGATIVE 02/11/2021 [place] 02/11/2021 [place] 02/11/2021 1225   PROTEINUR NEGATIVE 02/11/2021 1225   UROBILINOGEN 0.2 08/16/2012 1545   NITRITE NEGATIVE 02/11/2021 [place] 02/11/2021 1225    Radiological Exams on Admission: DG Chest Port 1 View  Result Date: 02/11/2021 CLINICAL DATA:  Dyspnea, COPD, smoker EXAM: PORTABLE CHEST 1 VIEW COMPARISON:   01/28/2021 chest radiograph. FINDINGS: Stable cardiomediastinal silhouette with top normal heart size. No pneumothorax. Loculated small to moderate lateral basilar left pleural effusion/thickening, mildly increased. Slight blunting of the right costophrenic angle. Cephalization of the pulmonary vasculature without overt pulmonary edema. Patchy and streaky opacities in the mid to lower left lung appear unchanged. IMPRESSION: 1. Loculated small to moderate lateral basilar left pleural effusion/thickening, mildly increased. 2. Chronic patchy and streaky opacities in the mid to lower left lung, favor nonspecific scarring. 3. Slight blunting of the right costophrenic angle, possible small right pleural effusion. Electronically Signed   By: Ilona Sorrel M.D.   On: 02/11/2021 11:26    EKG: Independently reviewed.  Sinus rhythm rate 65.  QTc 381.  No significant change from prior.  Assessment/Plan Principal Problem:   Acute respiratory failure with hypoxia (HCC) Active Problems:   Essential hypertension, benign   COPD severe GOLD III/ active smoker   COPD with acute exacerbation (HCC)   CRI (chronic renal insufficiency), stage 3 (moderate) (HCC)   Atrial flutter (HCC)   Anticoagulated   Acute hypoxic respiratory failure-O2 sats down to 82% on room air.  Currently on 2-1/2 L.  Sats 95 to 100%.  Will obtain CTA chest, but likely due to combination of COPD exacerbation and possibly contribution of acute anemia.  Small to moderate lateral basilar left pleural effusion/thickening mildly increased.  Reports compliance with Eliquis. -Supplemental oxygen -Follow-up CTA chest -May need pulmonary evaluation, for loculated effusion.  COPD exacerbation-faint expiratory wheezing, chronic cough.  Chest x-ray showing chronic patchy and streaky opacities favoring nonspecific scarring. -DuoNebs as needed and scheduled -Solu-Medrol 60 twice daily - Mucolytics as needed -Follow-up CTA chest - IV azithromycin  -  Addendum-CTA chest suggesting superimposed acute inflammatory/infectious process, no PE,  will add IV ceftriaxone to azithromycin.  Acute anemia- Hgb 7.8, last check 2 weeks ago hemoglobin was 10.6, baseline appears to be about 13-14.  No obvious GI blood loss.  On Eliquis -Check anemia panel - Stool FOBT. -Check CBC in the morning transfuse if hemoglobin drops further -GI consult in the morning -Hold Eliquis for now -N.p.o. midnight  Atrial fibrillation-rate controlled and on anticoagulation with Eliquis. -Resume Cardizem -Hold Eliquis with acute anemia of undetermined etiology  CKD 3-creatinine 1.46, at baseline.  DVT prophylaxis:  SCDS Code Status: Full code Family Communication: Spouse at bedside Disposition Plan: ~ 2 days Consults called: GI Admission status:  Inpt, tele  I certify that at the point of admission it is my clinical judgment that the patient will require inpatient hospital care spanning beyond 2 midnights from the point of admission due to high intensity of service, high risk for further deterioration and high frequency of surveillance required.   Bethena Roys MD [facility]  02/11/2021, 7:20 PM

## 2021-02-11 NOTE — ED Notes (Signed)
RT in room with pt.

## 2021-02-12 DIAGNOSIS — J9601 Acute respiratory failure with hypoxia: Secondary | ICD-10-CM | POA: Diagnosis not present

## 2021-02-12 DIAGNOSIS — E538 Deficiency of other specified B group vitamins: Secondary | ICD-10-CM | POA: Diagnosis present

## 2021-02-12 DIAGNOSIS — Z7901 Long term (current) use of anticoagulants: Secondary | ICD-10-CM

## 2021-02-12 DIAGNOSIS — D649 Anemia, unspecified: Secondary | ICD-10-CM

## 2021-02-12 DIAGNOSIS — N183 Chronic kidney disease, stage 3 unspecified: Secondary | ICD-10-CM

## 2021-02-12 LAB — CBC
HCT: 23.8 % — ABNORMAL LOW (ref 39.0–52.0)
Hemoglobin: 7 g/dL — ABNORMAL LOW (ref 13.0–17.0)
MCH: 30 pg (ref 26.0–34.0)
MCHC: 29.4 g/dL — ABNORMAL LOW (ref 30.0–36.0)
MCV: 102.1 fL — ABNORMAL HIGH (ref 80.0–100.0)
Platelets: 338 10*3/uL (ref 150–400)
RBC: 2.33 MIL/uL — ABNORMAL LOW (ref 4.22–5.81)
RDW: 14.3 % (ref 11.5–15.5)
WBC: 12 10*3/uL — ABNORMAL HIGH (ref 4.0–10.5)
nRBC: 0 % (ref 0.0–0.2)

## 2021-02-12 LAB — IRON AND TIBC
Iron: 34 ug/dL — ABNORMAL LOW (ref 45–182)
Saturation Ratios: 11 % — ABNORMAL LOW (ref 17.9–39.5)
TIBC: 319 ug/dL (ref 250–450)
UIBC: 285 ug/dL

## 2021-02-12 LAB — BASIC METABOLIC PANEL
Anion gap: 7 (ref 5–15)
BUN: 33 mg/dL — ABNORMAL HIGH (ref 8–23)
CO2: 27 mmol/L (ref 22–32)
Calcium: 8.3 mg/dL — ABNORMAL LOW (ref 8.9–10.3)
Chloride: 105 mmol/L (ref 98–111)
Creatinine, Ser: 1.51 mg/dL — ABNORMAL HIGH (ref 0.61–1.24)
GFR, Estimated: 48 mL/min — ABNORMAL LOW (ref >60)
Glucose, Bld: 106 mg/dL — ABNORMAL HIGH (ref 70–99)
Potassium: 5.1 mmol/L (ref 3.5–5.1)
Sodium: 139 mmol/L (ref 135–145)

## 2021-02-12 LAB — RETICULOCYTES
Immature Retic Fract: 23.9 % — ABNORMAL HIGH (ref 2.3–15.9)
RBC.: 2.36 MIL/uL — ABNORMAL LOW (ref 4.22–5.81)
Retic Count, Absolute: 88.3 10*3/uL (ref 19.0–186.0)
Retic Ct Pct: 3.7 % — ABNORMAL HIGH (ref 0.4–3.1)

## 2021-02-12 LAB — FERRITIN: Ferritin: 12 ng/mL — ABNORMAL LOW (ref 24–336)

## 2021-02-12 LAB — VITAMIN B12: Vitamin B-12: 83 pg/mL — ABNORMAL LOW (ref 180–914)

## 2021-02-12 LAB — FOLATE: Folate: 5.4 ng/mL — ABNORMAL LOW (ref >5.9)

## 2021-02-12 MED ORDER — VITAMIN B-12 1000 MCG PO TABS
1000.0000 ug | ORAL_TABLET | Freq: Every day | ORAL | Status: DC
  Administered 2021-02-15 – 2021-02-19 (×5): 1000 ug via ORAL
  Filled 2021-02-12 (×5): qty 1

## 2021-02-12 MED ORDER — CYANOCOBALAMIN 1000 MCG/ML IJ SOLN
1000.0000 ug | Freq: Every day | INTRAMUSCULAR | Status: AC
  Administered 2021-02-12 – 2021-02-14 (×3): 1000 ug via INTRAMUSCULAR
  Filled 2021-02-12 (×3): qty 1

## 2021-02-12 MED ORDER — FOLIC ACID 1 MG PO TABS
1.0000 mg | ORAL_TABLET | Freq: Every day | ORAL | Status: DC
  Administered 2021-02-12 – 2021-02-19 (×8): 1 mg via ORAL
  Filled 2021-02-12 (×8): qty 1

## 2021-02-12 NOTE — Consult Note (Addendum)
Referring Provider: No ref. provider found Primary Care Physician:  Kathyrn Drown, MD Primary Gastroenterologist:  not previously established  Date of Admission: 02/11/21 Date of Consultation: 02/12/21  Reason for Consultation:  acute anemia on anticoagulation  HPI:  Blake Cherry is a 75 y.o. year old male with past medical history of a fib on Eliquis, COPD, A flutter, CKD III, HTN and previous CVA, Who presented to the ED via EMS for c/o SOB for the past 2-3 days, O2 sats were 80% on room air upon EMS arrival. Patient reported orthopnea with the need to sit up to sleep, he has chronic cough at baseline, he denied CP, LE edema. He denied melena, rectal bleeding, vomiting, abdominal pain or frequent NSAID use. Over the past few days he has required his wife's home O2 to help him breathe.   ED Course: O2 sats 82% on RA, improvement to 95-100% on 2.5L O2, afebrile, HR WNL, RR 13-26.  Hgb 7.8, WBC 10.0, BNP 376, c xray with loculated small to moderate lateral basilar left pleural effusion/thickening, mildly increased from prior study, also with chronic patchy/streaky opacities, favoring scarring. GI consulted to anemia. CTA chest negative for PE but suspicious for superimposed acute infectious or inflammatory process with progressive bronchiolar inflammation, and scatter airway impaction.  Consult: Hgb down to 7 this morning, with MCV 102.1, WBC 12, B12 83, Folate 5.4, Iron 34, TIBC 319, Saturation 11, ferritin 12. Creatnine 1.51, BUN 33, calcium 8.3, FOBT pending  Patient continues to have significant SOB, especially with exertion, he is currently on 4L supplemental O2 with O2 sat 98-100%. He walked to the restroom this morning and was unable to make it all th way back to his bed due to shortness of breath. He reports that he has been feeling very sob and fatigued over the past few weeks. He reports that his appetite was good prior to acute illness onset, though he has lost approx 15-20 lbs  since the beginning of the year. His wife at bedside reports that patient typically may eat a good breakfast but otherwise he eats a lot of junk foods/snacks throughout the rest of the day, averaging usually only 1 meal per day. He denies any abdominal pain, nausea, vomiting, diarrhea, constipation, melena, or hematochezia. He reports that he takes aleve maybe 1-2x week, as well as a baby asa daily, along with his elqiuis. He denies any early satiety or postprandial abdominal pain. He denies any issues with acid reflux, has very rare dysphagia.   Last colonoscopy: 2007 tortuous sigmoid colon, 19mm flat sigmoid colon polyp, pancolonic diverticulosis, normal view of rectum, Biopsy results?  Last [id number] years ago, unsure of reason he had it or who performed this  Past Medical History:  Diagnosis Date   AAA (abdominal aortic aneurysm)    Endovascular stent repair 2014   Atrial fibrillation and flutter ([place])    Carotid artery disease ([place]) 2007   Left CEA   CKD (chronic kidney disease), stage III ([place])    COPD (chronic obstructive pulmonary disease) ([place])    Diverticulitis    Essential hypertension    Stroke ([place]) 2007   SVT (supraventricular tachycardia) ([place])     Past Surgical History:  Procedure Laterality Date   ABDOMINAL AORTAGRAM N/A 03/10/2013   Procedure: ABDOMINAL Maxcine Ham;  Surgeon: Elam Dutch, MD;  Location: [facility] C[facility] LAB;  Service: Cardiovascular;  Laterality: N/A;   ABDOMINAL AORTIC ENDOVASCULAR STENT GRAFT N/A 08/22/2012   Procedure: ABDOMINAL AORTIC ENDOVASCULAR STENT GRAFT;  Surgeon: Elam Dutch, MD;  Location: [place];  Service: Vascular;  Laterality: N/A;   CAROTID ENDARTERECTOMY Left 2007   CAROTID STENT  2007   left internal carotid artery   TESTICLE TORSION REDUCTION     age of 72    Prior to Admission medications   Medication Sig Start Date End Date Taking? Authorizing Provider  albuterol (PROVENTIL) (2.5 MG/3ML) 0.083% nebulizer solution USE 1 VIAL IN  NEBULIZER EVERY 4 HOURS AS NEEDED FOR WHEEZING OR SHORTNESS OF BREATH 02/07/20  Yes Luking, Elayne Snare, MD  apixaban (ELIQUIS) 5 MG TABS tablet Take 1 tablet (5 mg total) by mouth 2 (two) times daily. 01/30/21  Yes Satira Sark, MD  budesonide (PULMICORT) 0.5 MG/2ML nebulizer solution USE ONE VIAL IN NEBULIZER TWICE DAILY 07/24/20  Yes Luking, Elayne Snare, MD  diltiazem (CARDIZEM CD) 180 MG 24 hr capsule Take 1 capsule (180 mg total) by mouth daily. 01/30/21  Yes Satira Sark, MD  dronedarone (MULTAQ) 400 MG tablet Take 1 tablet (400 mg total) by mouth 2 (two) times daily with a meal. 01/30/21  Yes Satira Sark, MD  guaiFENesin (MUCINEX) 600 MG 12 hr tablet Take 1 tablet (600 mg total) by mouth 2 (two) times daily. Patient taking differently: Take 600 mg by mouth as needed. 09/19/14  Yes Kathie Dike, MD  PROAIR HFA 108 (90 Base) MCG/ACT inhaler INHALE 2 PUFFS BY MOUTH EVERY 6 HOURS AS NEEDED FOR WHEEZING FOR SHORTNESS OF BREATH 07/19/20  Yes Luking, Elayne Snare, MD  rosuvastatin (CRESTOR) 5 MG tablet Take 1 tablet (5 mg total) by mouth daily. 07/24/20  Yes Luking, Elayne Snare, MD  Budeson-Glycopyrrol-Formoterol (BREZTRI AEROSPHERE) 160-9-4.8 MCG/ACT AERO Inhale 2 puffs into the lungs in the morning and at bedtime. Patient not taking: Reported on 02/11/2021 07/24/20   Kathyrn Drown, MD  furosemide (LASIX) 20 MG tablet Take 20 mg by mouth daily as needed. Patient not taking: Reported on 02/11/2021 02/26/20   [provider]  mometasone (ELOCON) 0.1 % cream Apply BID PRN Patient not taking: Reported on 02/11/2021 09/26/19   Kathyrn Drown, MD  predniSONE (DELTASONE) 10 MG tablet Take  4 each am x 2 days,   2 each am x 2 days,  1 each am x 2 days and stop Patient not taking: Reported on 02/11/2021 01/21/21   Ameduite, Trenton Gammon, NP    Current Facility-Administered Medications  Medication Dose Route Frequency Provider Last Rate Last Admin   acetaminophen (TYLENOL) tablet 650 mg  650 mg Oral Q6H  PRN Emokpae, Ejiroghene E, MD       Or   acetaminophen (TYLENOL) suppository 650 mg  650 mg Rectal Q6H PRN Emokpae, Ejiroghene E, MD       albuterol (PROVENTIL) (2.5 MG/3ML) 0.083% nebulizer solution 2.5 mg  2.5 mg Nebulization Q4H PRN Emokpae, Ejiroghene E, MD       azithromycin (ZITHROMAX) 500 mg in sodium chloride 0.9 % 250 mL IVPB  500 mg Intravenous Q24H Kathrynn Humble, Ankit, MD   Stopped at 02/11/21 1430   budesonide (PULMICORT) nebulizer solution 0.5 mg  0.5 mg Nebulization BID Emokpae, Ejiroghene E, MD   0.5 mg at 02/12/21 0735   cefTRIAXone (ROCEPHIN) 2 g in sodium chloride 0.9 % 100 mL IVPB  2 g Intravenous Q24H Emokpae, Ejiroghene E, MD       cyanocobalamin ((VITAMIN B-12)) injection 1,000 mcg  1,000 mcg Intramuscular Daily Johnson, Clanford L, MD       Followed by   [  START ON 02/15/2021] vitamin B-12 (CYANOCOBALAMIN) tablet 1,000 mcg  1,000 mcg Oral Daily Johnson, Clanford L, MD       diltiazem (CARDIZEM CD) 24 hr capsule 180 mg  180 mg Oral Daily Emokpae, Ejiroghene E, MD   180 mg at 02/11/21 2043   dronedarone (MULTAQ) tablet 400 mg  400 mg Oral BID WC Emokpae, Ejiroghene E, MD       folic acid (FOLVITE) tablet 1 mg  1 mg Oral Daily Johnson, Clanford L, MD       ipratropium-albuterol (DUONEB) 0.5-2.5 (3) MG/3ML nebulizer solution 3 mL  3 mL Nebulization TID Denton Brick, Courage, MD   3 mL at 02/12/21 0735   ondansetron (ZOFRAN) tablet 4 mg  4 mg Oral Q6H PRN Emokpae, Ejiroghene E, MD       Or   ondansetron (ZOFRAN) injection 4 mg  4 mg Intravenous Q6H PRN Emokpae, Ejiroghene E, MD       polyethylene glycol (MIRALAX / GLYCOLAX) packet 17 g  17 g Oral Daily PRN Emokpae, Ejiroghene E, MD       rosuvastatin (CRESTOR) tablet 5 mg  5 mg Oral Daily Emokpae, Ejiroghene E, MD        Allergies as of 02/11/2021   (No Known Allergies)    Family History  Problem Relation Age of Onset   Cancer Mother        Breast cancer   Hypertension Mother    Diabetes Mother    Diabetes Father     Social  History   Socioeconomic History   Marital status: Married    Spouse name: Not on file   Number of children: Not on file   Years of education: Not on file   Highest education level: Not on file  Occupational History   Not on file  Tobacco Use   Smoking status: Every Day    Packs/day: 1.00    Years: 60.00    Pack years: 60.00    Types: Cigarettes   Smokeless tobacco: Former    Types: Snuff   Tobacco comments:    4-5 cigs per day 08/16/20//lmr  Vaping Use   Vaping Use: Never used  Substance and Sexual Activity   Alcohol use: No    Alcohol/week: 0.0 standard drinks   Drug use: No   Sexual activity: Not on file  Other Topics Concern   Not on file  Social History Narrative   Not on file   Social Determinants of Health   Financial Resource Strain: Low Risk    Difficulty of Paying Living Expenses: Not hard at all  Food Insecurity: No Food Insecurity   Worried About Charity fundraiser in the Last Year: Never true   Arboriculturist in the Last Year: Never true  Transportation Needs: No Transportation Needs   Lack of Transportation (Medical): No   Lack of Transportation (Non-Medical): No  Physical Activity: Inactive   Days of Exercise per Week: 0 days   Minutes of Exercise per Session: 0 min  Stress: No Stress Concern Present   Feeling of Stress : Not at all  Social Connections: Moderately Integrated   Frequency of Communication with Friends and Family: More than three times a week   Frequency of Social Gatherings with Friends and Family: More than three times a week   Attends Religious Services: More than 4 times per year   Active Member of [employer] or Organizations: No   Attends Archivist Meetings: Never   Marital  Status: Married  Human resources officer Violence: Not At Risk   Fear of Current or Ex-Partner: No   Emotionally Abused: No   Physically Abused: No   Sexually Abused: No   Review of Systems: Gen: Denies fever, chills, loss of appetite, change in weight  or +weight loss CV: Denies chest pain, heart palpitations, syncope, edema  Resp: +SOB with rest and exertion, +cough +wheezing GI: denies melena, hematochezia, nausea, vomiting, diarrhea, constipation, dysphagia, odyonophagia, early satiety. +weight loss GU : Denies urinary burning, urinary frequency, urinary incontinence.  MS: Denies joint pain,swelling, cramping Derm: Denies rash, itching, dry skin Psych: Denies depression, anxiety,confusion, or memory loss Heme: Denies bruising, bleeding, and enlarged lymph nodes.  Physical Exam: Vital signs in last 24 hours: Temp:  [97.8 F (36.6 C)-98.5 F (36.9 C)] 98 F (36.7 C) (12/21 0541) Pulse Rate:  [64-93] 85 (12/21 0541) Resp:  [13-26] 18 (12/21 0541) BP: (95-122)/(42-56) 108/44 (12/21 0541) SpO2:  [76 %-100 %] 99 % (12/21 0735) FiO2 (%):  [30 %] 30 % (12/20 1917) Weight:  [73.9 kg] 73.9 kg (12/20 1038) Last BM Date: 02/10/21 General:   Alert,  Well-developed, well-nourished, pleasant and cooperative in NAD Head:  Normocephalic and atraumatic. Eyes:  Sclera clear, no icterus.   Conjunctiva pink. Ears:  Normal auditory acuity. Nose:  No deformity, discharge,  or lesions. Mouth:  No deformity or lesions, dentition normal. Neck:  Supple; no masses or thyromegaly. Lungs:  Rhonchi and wheezing throughout, respirations mildly labored even at rest Heart:  Regular rate and rhythm; no murmurs, clicks, rubs,  or gallops. Abdomen:  Soft, nontender and nondistended. No masses, hepatosplenomegaly or hernias noted. Normal bowel sounds, without guarding, and without rebound.   Rectal:  Deferred until time of colonoscopy.   Msk:  Symmetrical without gross deformities. Normal posture. Pulses:  Normal pulses noted. Extremities:  Without clubbing or edema. Neurologic:  Alert and  oriented x4;  grossly normal neurologically. Skin:  Intact without significant lesions or rashes. Psych:  Alert and cooperative. Normal mood and affect.  Intake/Output  from previous day: 12/20 0701 - 12/21 0700 In: 2318.4 [P.O.:200; I.V.:1771.6; IV Piggyback:346.8] Out: 100 [Urine:100] Intake/Output this shift: No intake/output data recorded.  Lab Results: Recent Labs    02/11/21 1208 02/12/21 0503  WBC 10.9* 12.0*  HGB 7.8* 7.0*  HCT 26.6* 23.8*  PLT 354 338   BMET Recent Labs    02/11/21 1208 02/12/21 0503  NA 138 139  K 5.1 5.1  CL 105 105  CO2 28 27  GLUCOSE 93 106*  BUN 32* 33*  CREATININE 1.46* 1.51*  CALCIUM 8.2* 8.3*   LFT Recent Labs    02/11/21 1208  PROT 6.3*  ALBUMIN 2.9*  AST 17  ALT 8  ALKPHOS 57  BILITOT 0.4   PT/INR Recent Labs    02/11/21 1208  LABPROT 15.3*  INR 1.2   Studies/Results: CT Angio Chest Pulmonary Embolism (PE) W or WO Contrast  Result Date: 02/11/2021 CLINICAL DATA:  Dyspnea, hypoxia, COPD EXAM: CT ANGIOGRAPHY CHEST WITH CONTRAST TECHNIQUE: Multidetector CT imaging of the chest was performed using the standard protocol during bolus administration of intravenous contrast. Multiplanar CT image reconstructions and MIPs were obtained to evaluate the vascular anatomy. CONTRAST:  49mL OMNIPAQUE IOHEXOL 350 MG/ML SOLN COMPARISON:  01/28/2021 FINDINGS: Cardiovascular: There is adequate opacification of the pulmonary arterial tree. No intraluminal filling defect identified to suggest acute pulmonary embolism. The central pulmonary arteries are mildly enlarged in keeping with changes of pulmonary arterial hypertension. Extensive  multi-vessel coronary artery calcification. Global cardiac size is within normal limits. No pericardial effusion. Moderate atherosclerotic calcification within the thoracic aorta. Small superimposed atheromatous plaque within the descending thoracic aorta without ulceration. No aortic aneurysm. Mediastinum/Nodes: Shotty precarinal adenopathy is stable. No additional pathologic thoracic adenopathy is identified. Esophagus unremarkable. Thyroid unremarkable. Lungs/Pleura: Mild  emphysema. Parenchymal scarring with rounded atelectasis noted within the basilar lingula and left-sided volume loss is again noted. Bibasilar cylindrical bronchiectasis again noted. Since the prior examination, there has developed scattered ground-glass infiltrate within the posterior segment of the right upper lobe and lateral segment of the right middle lobe. Additionally, there has developed progressive bronchial wall thickening and airway impaction. Together, the findings may reflect changes of superimposed acute inflammation or infection. Small right pleural effusion has developed. No pneumothorax. Upper Abdomen: No acute abnormality. Musculoskeletal: No chest wall abnormality. No acute or significant osseous findings. Review of the MIP images confirms the above findings. IMPRESSION: No pulmonary embolism. Interval development of scattered parenchymal infiltrate, progressive bronchiolar inflammation, and scattered airway impaction suggesting a superimposed acute infectious or inflammatory process. Interval development of a small right pleural effusion. Mild emphysema. Extensive multi-vessel coronary artery calcification. Morphologic changes in keeping with pulmonary arterial hypertension. Aortic Atherosclerosis (ICD10-I70.0) and Emphysema (ICD10-J43.9). Electronically Signed   By: Fidela Salisbury M.D.   On: 02/11/2021 19:42   DG Chest Port 1 View  Result Date: 02/11/2021 CLINICAL DATA:  Dyspnea, COPD, smoker EXAM: PORTABLE CHEST 1 VIEW COMPARISON:  01/28/2021 chest radiograph. FINDINGS: Stable cardiomediastinal silhouette with top normal heart size. No pneumothorax. Loculated small to moderate lateral basilar left pleural effusion/thickening, mildly increased. Slight blunting of the right costophrenic angle. Cephalization of the pulmonary vasculature without overt pulmonary edema. Patchy and streaky opacities in the mid to lower left lung appear unchanged. IMPRESSION: 1. Loculated small to moderate lateral  basilar left pleural effusion/thickening, mildly increased. 2. Chronic patchy and streaky opacities in the mid to lower left lung, favor nonspecific scarring. 3. Slight blunting of the right costophrenic angle, possible small right pleural effusion. Electronically Signed   By: Ilona Sorrel M.D.   On: 02/11/2021 11:26    Impression: LORANZA LASKER is a 75 y.o. year old male with past medical history of a fib on Eliquis, COPD, A flutter, CKD III, HTN and previous CVA, Who presented to the ED yesterday via EMS for c/o SOB for the past 2-3 days, O2 sats were 80% on room air upon EMS arrival, he denied nausea, vomiting, diarrhea, melena or rectal bleeding, Found to be anemic with hemoglobin of 7.8, GI consulted for further evaluation. Patient continues to remain on supplemental O2 at 4L/min with significant sob with any mild exertion. He denies any rectal bleeding, melena or other GI symptoms.   Anemia: hgb down to 7 today, down from 7.8 on admission and 10.6 two weeks ago, though baseline appears to be around 14-15. Folate 5.4, B12 83 and being supplemented per hospitalist, Iron 34, TIBC 319, Ferritin 12, Saturation 11. BUN elevated at 33, WNL at 23 two weeks ago with baseline appearing around 23, this raises suspicion for an UGIB source, though given he is chronically anticoagulated, on 81mg  ASA daily and taking naproxen 1-2x/week, it is possible he could have a source of bleeding most anywhere in the GI tract, we cannot rule out PUD, Gastritis, AVMs, bleeding polyps, duodenitis, and less likely malignancy. Last Colonoscopy was in 2007 and EGD likely prior to that, as patient states it was many years ago, no records available  for review. He denies any dizziness or syncopal episodes,  Reassuringly he is without overt Rectal bleeding, melena or hematemesis. He would benefit from further evaluation of his anemia endoscopically with EGD and Colonoscopy, however At this time, given his respiratory status, it is not  safe for [place] to proceed with any endoscopic procedures. Should continue to trend H&H, transfuse as needed for hgb <7, GI will follow peripherally at this time unless patient develops overt GI bleeding.   Plan: Trend H&H, transfuse less than 7 Hold Eliquis Plan for EGD+Colonoscopy once respiratory status is more stable Avoid NSAIDs.  Monitor for overt GI bleeding, consider endoscopic procedures sooner if this develops GI will follow peripherally at this time Can advance to soft diet as long as patient tolerates it, as Endoscopic procedures are on hold for now   LOS: 1 day    02/12/2021, 9:45 AM   Azariya Freeman L. Jeanmarie Hubert, MSN, APRN, AGNP-C Adult-Gerontology Nurse Practitioner [facility]

## 2021-02-12 NOTE — TOC Progression Note (Signed)
Transition of Care [facility]) - Progression Note    Patient Details  Name: Blake Cherry MRN: [id number] Date of Birth: Nov 24, 1945  Transition of Care [facility]) CM/SW Contact  Karn Cassis, [place] Phone Number: 02/12/2021, 10:41 AM  Clinical Narrative:    Transition of Care [facility]) Screening Note   Patient Details  Name: Blake Cherry Date of Birth: [birth year]   Transition of Care [facility]) CM/SW Contact:    Karn Cassis, LCSW Phone Number: 02/12/2021, 10:41 AM    Transition of Care Department [facility]) has reviewed patient and no TOC needs have been identified at this time. We will continue to monitor patient advancement through interdisciplinary progression rounds. If new patient transition needs arise, please place a TOC consult.           Expected Discharge Plan and Services                                                 Social Determinants of Health (SDOH) Interventions    Readmission Risk Interventions No flowsheet data found.

## 2021-02-12 NOTE — Progress Notes (Signed)
Patient NPO this am for GI consult. Discussed with Doylene Bode, NP and she stated okay to administer am meds as scheduled with sip of water.

## 2021-02-12 NOTE — Plan of Care (Signed)
°  Problem: Acute Rehab PT Goals(only PT should resolve) Goal: Pt Will Go Supine/Side To Sit Outcome: Progressing Flowsheets (Taken 02/12/2021 1534) Pt will go Supine/Side to Sit: Independently Goal: Patient Will Transfer Sit To/From Stand Outcome: Progressing Flowsheets (Taken 02/12/2021 1534) Patient will transfer sit to/from stand: Independently Goal: Pt Will Transfer Bed To Chair/Chair To Bed Outcome: Progressing Flowsheets (Taken 02/12/2021 1534) Pt will Transfer Bed to Chair/Chair to Bed:  Independently  with modified independence Goal: Pt Will Ambulate Outcome: Progressing Flowsheets (Taken 02/12/2021 1534) Pt will Ambulate:  75 feet  with supervision  with modified independence  with least restrictive assistive device  3:35 PM, 02/12/21 Ocie Bob, MPT Physical Therapist with [facility] [phone] office [phone] mobile phone

## 2021-02-12 NOTE — TOC Progression Note (Signed)
Transition of Care [facility]) - Progression Note    Patient Details  Name: Blake Cherry MRN: [id number] Date of Birth: 1945-05-14  Transition of Care [facility]) CM/SW Contact  Karn Cassis, [place] Phone Number: 02/12/2021, 3:16 PM  Clinical Narrative:  PT evaluated pt and recommend HHPT. Discussed with pt who is not interested in any home health services. He states he has a walker at home. Pt requests home O2. Discussed with pt that he will need to qualify for home O2 and will evaluate closer to d/c. TOC will continue to follow and assist with d/c planning needs.         Barriers to Discharge: Continued Medical Work up  Expected Discharge Plan and Services                                                 Social Determinants of Health (SDOH) Interventions    Readmission Risk Interventions No flowsheet data found.

## 2021-02-12 NOTE — Evaluation (Signed)
Physical Therapy Evaluation Patient Details Name: Blake Cherry MRN: [id number] DOB: [birth year] Today's Date: 02/12/2021  History of Present Illness  Blake Cherry is a 75 y.o. male with medical history significant for COPD, atrial flutter, CKD 3, hypertension, stroke.  Patient presented to the ED with complaints of difficulty breathing of 2 to 3 days.  Reports difficulty breathing at rest, that worsens with exertion.  Reports he is unable to lie back to sleep due to difficulty breathing.,  And sleeps sitting up.  He also reports a cough that is dry, and nasal congestion.  He has chronic cough that has not significantly changed from his baseline.  He denies chest pain.  No lower extremity swelling.  He is on Eliquis and reports compliance.   He denies black stools, no blood in stools.  No vomiting.  No abdominal pain.  He takes 400 mg of ibuprofen very occasionally, on the average 2-3 times a month.     Patient's wife is on home O2, over the past few days, he has had to share his wife's oxygen.   Clinical Impression  Patient functioning near baseline for functional mobility and gait other than becoming SOB easily and limited to ambulation in room.  Patient demonstrates good return for ambulating to bathroom to urinate into commode and tolerated sitting up in chair after therapy while on 6 LPM with his spouse present in room.  Patient will benefit from continued skilled physical therapy in hospital and recommended venue below to increase strength, balance, endurance for safe ADLs and gait.         Recommendations for follow up therapy are one component of a multi-disciplinary discharge planning process, led by the attending physician.  Recommendations may be updated based on patient status, additional functional criteria and insurance authorization.  Follow Up Recommendations Home health PT    Assistance Recommended at Discharge PRN  Functional Status Assessment Patient has had a recent  decline in their functional status and demonstrates the ability to make significant improvements in function in a reasonable and predictable amount of time.  Equipment Recommendations  None recommended by PT    Recommendations for Other Services       Precautions / Restrictions Precautions Precautions: Fall Restrictions Weight Bearing Restrictions: No      Mobility  Bed Mobility Overal bed mobility: Modified Independent                  Transfers Overall transfer level: Modified independent                      Ambulation/Gait Ambulation/Gait assistance: Supervision;Min guard Gait Distance (Feet): 25 Feet Assistive device: None Gait Pattern/deviations: Decreased step length - right;Decreased step length - left;Decreased stride length Gait velocity: decreased     General Gait Details: slow labored cadence with occasional leaning on nearby objects for suppor once fatigued and limited mostly due to SOB  Stairs            Wheelchair Mobility    Modified Rankin (Stroke Patients Only)       Balance Overall balance assessment: Needs assistance Sitting-balance support: Feet supported;No upper extremity supported Sitting balance-Leahy Scale: Good Sitting balance - Comments: seated at EOB   Standing balance support: No upper extremity supported;During functional activity Standing balance-Leahy Scale: Fair Standing balance comment: fair/good  Pertinent Vitals/Pain Pain Assessment: No/denies pain    Home Living Family/patient expects to be discharged to:: Private residence Living Arrangements: Spouse/significant other Available Help at Discharge: Family;Available 24 hours/day Type of Home: House Home Access: Stairs to enter Entrance Stairs-Rails: Left Entrance Stairs-Number of Steps: 2   Home Layout: One level Home Equipment: Conservation officer, nature (2 wheels);Cane - single point;Shower seat - built in;Grab bars  - tub/shower      Prior Function Prior Level of Function : Independent/Modified Independent             Mobility Comments: household and short distanced community ambulator without AD, on 6 LPM home O2 ADLs Comments: Independent     Hand Dominance        Extremity/Trunk Assessment   Upper Extremity Assessment Upper Extremity Assessment: Overall WFL for tasks assessed    Lower Extremity Assessment Lower Extremity Assessment: Generalized weakness    Cervical / Trunk Assessment Cervical / Trunk Assessment: Normal  Communication   Communication: No difficulties  Cognition Arousal/Alertness: Awake/alert Behavior During Therapy: WFL for tasks assessed/performed Overall Cognitive Status: Within Functional Limits for tasks assessed                                          General Comments      Exercises     Assessment/Plan    PT Assessment Patient needs continued PT services  PT Problem List Decreased strength;Decreased activity tolerance;Decreased balance;Decreased mobility;Cardiopulmonary status limiting activity       PT Treatment Interventions DME instruction;Gait training;Stair training;Functional mobility training;Therapeutic activities;Therapeutic exercise;Patient/family education;Balance training    PT Goals (Current goals can be found in the Care Plan section)  Acute Rehab PT Goals Patient Stated Goal: return home with family to assist PT Goal Formulation: With patient/family Time For Goal Achievement: 02/17/21 Potential to Achieve Goals: Good    Frequency Min 2X/week   Barriers to discharge        Co-evaluation               AM-PAC PT "6 Clicks" Mobility  Outcome Measure Help needed turning from your back to your side while in a flat bed without using bedrails?: None Help needed moving from lying on your back to sitting on the side of a flat bed without using bedrails?: None Help needed moving to and from a bed to a  chair (including a wheelchair)?: None Help needed standing up from a chair using your arms (e.g., wheelchair or bedside chair)?: None Help needed to walk in hospital room?: A Little Help needed climbing 3-5 steps with a railing? : A Little 6 Click Score: 22    End of Session Equipment Utilized During Treatment: Oxygen Activity Tolerance: Patient tolerated treatment well;Patient limited by fatigue Patient left: in chair;with call bell/phone within reach;with family/visitor present Nurse Communication: Mobility status PT Visit Diagnosis: Unsteadiness on feet (R26.81);Other abnormalities of gait and mobility (R26.89);Muscle weakness (generalized) (M62.81)    Time: 1430-1450 PT Time Calculation (min) (ACUTE ONLY): 20 min   Charges:   PT Evaluation $PT Eval Moderate Complexity: 1 Mod PT Treatments $Therapeutic Activity: 8-22 mins        3:33 PM, 02/12/21 Lonell Grandchild, MPT Physical Therapist with [facility] [phone] office [phone] mobile phone

## 2021-02-12 NOTE — Progress Notes (Signed)
PROGRESS NOTE   Blake Cherry  [id number] DOB: 05-03-1945 DOA: 02/11/2021 PCP: Kathyrn Drown, MD   Chief Complaint  Patient presents with   Shortness of Breath   Level of care: Telemetry  Brief Admission History:  75 y.o. male with medical history significant for COPD, atrial flutter, CKD 3, hypertension, stroke.  presented to the ED with complaints of difficulty breathing of 2 to 3 days.  Reports difficulty breathing at rest, that worsens with exertion.  Reports he is unable to lie back to sleep due to difficulty breathing.,  And sleeps sitting up.  He also reports a cough that is dry, and nasal congestion.  He has chronic cough that has not significantly changed from his baseline.  He denies chest pain.  No lower extremity swelling.  He is on Eliquis and reports compliance.   He denies black stools, no blood in stools.  No vomiting.  No abdominal pain.  He takes 400 mg of ibuprofen very occasionally, on the average 2-3 times a month.  He was admitted with acute respiratory failure with hypoxia and acute blood loss anemia.    Assessment & Plan:   Principal Problem:   Acute respiratory failure with hypoxia (HCC) Active Problems:   B12 deficiency   Essential hypertension, benign   COPD severe GOLD III/ active smoker   COPD with acute exacerbation (HCC)   CRI (chronic renal insufficiency), stage 3 (moderate) (HCC)   Atrial flutter (HCC)   Anticoagulated   Acute anemia   Acute respiratory failure with hypoxia  -Multifactorial given pneumonia findings and acute anemia -Continue supplemental oxygen -CTA with findings of pneumonia but no PE  COPD exacerbation -Treating with scheduled IV steroids and bronchodilators and mucolytic's -Continue antibiotics as ordered  Acute anemia -Noted to have low 123456 and folic acid levels -Follow FOBT -GI consultation appreciated -Temporarily holding apixaban -Supplementing 123456 and folic acid  123456 and folic acid  deficiency -Supplementation orders written to start 02/12/2021  Chronic atrial fibrillation -Temporarily holding apixaban in setting of acute anemia -Cardizem ordered for heart rate control  Stage IIIb CKD -Stable and following closely   DVT prophylaxis: SCDs Code Status: full  Family Communication: spouse  Disposition: anticipate DC home  Status is: Inpatient  Remains inpatient appropriate because: IV antibiotics required, GI bleed work up in progress    Consultants:  N/a  Procedures:  N/a  Antimicrobials:  Ceftriaxone and azithromycin 12/20>>  Subjective: Patient reports ongoing shortness of breath cough chest congestion but denies fever and chills and chest pain.  Objective: Vitals:   02/12/21 0127 02/12/21 0541 02/12/21 0735 02/12/21 1000  BP: (!) 108/42 (!) 108/44    Pulse: 89 85    Resp: 20 18    Temp: 98.4 F (36.9 C) 98 F (36.7 C)    TempSrc: Oral Oral    SpO2: 96% 99% 99% 98%  Weight:      Height:        Intake/Output Summary (Last 24 hours) at 02/12/2021 1133 Last data filed at 02/12/2021 0700 Gross per 24 hour  Intake 2318.38 ml  Output 100 ml  Net 2218.38 ml   Filed Weights   02/11/21 1038  Weight: 73.9 kg    Examination:  General exam: Sitting up on the side of the bed, awake, alert appears calm and comfortable  Respiratory system: Poor air movement bilaterally, moderate increased work of breathing. Cardiovascular system: normal S1 & S2 heard. No JVD, murmurs, rubs, gallops or clicks. No pedal edema. Gastrointestinal  system: Abdomen is nondistended, soft and nontender. No organomegaly or masses felt. Normal bowel sounds heard. Central nervous system: Alert and oriented. No focal neurological deficits. Extremities: Symmetric 5 x 5 power. Skin: No rashes, lesions or ulcers Psychiatry: Judgement and insight appear normal. Mood & affect appropriate.   Data Reviewed: I have personally reviewed following labs and imaging  studies  CBC: Recent Labs  Lab 02/11/21 1208 02/12/21 0503  WBC 10.9* 12.0*  NEUTROABS 7.8*  --   HGB 7.8* 7.0*  HCT 26.6* 23.8*  MCV 102.7* 102.1*  PLT 354 [id number]    Basic Metabolic Panel: Recent Labs  Lab 02/11/21 1208 02/12/21 0503  NA 138 139  K 5.1 5.1  CL 105 105  CO2 28 27  GLUCOSE 93 106*  BUN 32* 33*  CREATININE 1.46* 1.51*  CALCIUM 8.2* 8.3*    GFR: Estimated Creatinine Clearance: 40.1 mL/min (A) (by C-G formula based on SCr of 1.51 mg/dL (H)).  Liver Function Tests: Recent Labs  Lab 02/11/21 1208  AST 17  ALT 8  ALKPHOS 57  BILITOT 0.4  PROT 6.3*  ALBUMIN 2.9*    CBG: No results for input(s): GLUCAP in the last 168 hours.  Recent Results (from the past 240 hour(s))  Blood Culture (routine x 2)     Status: None (Preliminary result)   Collection Time: 02/11/21 12:15 PM   Specimen: Left Antecubital; Blood  Result Value Ref Range Status   Specimen Description LEFT ANTECUBITAL  Final   Special Requests   Final    BOTTLES DRAWN AEROBIC AND ANAEROBIC Blood Culture results may not be optimal due to an excessive volume of blood received in culture bottles Performed at [facility], [address] [place], [place] [address]    Culture PENDING  Incomplete   Report Status PENDING  Incomplete  Blood Culture (routine x 2)     Status: None (Preliminary result)   Collection Time: 02/11/21 12:19 PM   Specimen: Right Antecubital; Blood  Result Value Ref Range Status   Specimen Description RIGHT ANTECUBITAL  Final   Special Requests   Final    BOTTLES DRAWN AEROBIC AND ANAEROBIC Blood Culture adequate volume Performed at [facility], [address] [place], [place] [address]    Culture PENDING  Incomplete   Report Status PENDING  Incomplete  Resp Panel by RT-PCR (Flu A&B, Covid) Nasopharyngeal Swab     Status: None   Collection Time: 02/11/21 12:25 PM   Specimen: Nasopharyngeal Swab; Nasopharyngeal(NP) swabs in vial transport medium  Result Value Ref  Range Status   SARS Coronavirus 2 by RT PCR NEGATIVE NEGATIVE Final    Comment: (NOTE) SARS-CoV-2 target nucleic acids are NOT DETECTED.  The SARS-CoV-2 RNA is generally detectable in upper respiratory specimens during the acute phase of infection. The lowest concentration of SARS-CoV-2 viral copies this assay can detect is 138 copies/mL. A negative result does not preclude SARS-Cov-2 infection and should not be used as the sole basis for treatment or other patient management decisions. A negative result may occur with  improper specimen collection/handling, submission of specimen other than nasopharyngeal swab, presence of viral mutation(s) within the areas targeted by this assay, and inadequate number of viral copies(<138 copies/mL). A negative result must be combined with clinical observations, patient history, and epidemiological information. The expected result is Negative.  Fact Sheet for Patients:  EntrepreneurPulse.com.au  Fact Sheet for Healthcare Providers:  IncredibleEmployment.be  This test is no t yet approved or cleared by the [place]  FDA and  has been authorized for detection and/or diagnosis of SARS-CoV-2 by FDA under an Emergency Use Authorization (EUA). This EUA will remain  in effect (meaning this test can be used) for the duration of the COVID-19 declaration under Section 564(b)(1) of the Act, 21 U.S.C.section 360bbb-3(b)(1), unless the authorization is terminated  or revoked sooner.       Influenza A by PCR NEGATIVE NEGATIVE Final   Influenza B by PCR NEGATIVE NEGATIVE Final    Comment: (NOTE) The Xpert Xpress SARS-CoV-2/FLU/RSV plus assay is intended as an aid in the diagnosis of influenza from Nasopharyngeal swab specimens and should not be used as a sole basis for treatment. Nasal washings and aspirates are unacceptable for Xpert Xpress SARS-CoV-2/FLU/RSV testing.  Fact Sheet for  Patients: EntrepreneurPulse.com.au  Fact Sheet for Healthcare Providers: IncredibleEmployment.be  This test is not yet approved or cleared by the [place] FDA and has been authorized for detection and/or diagnosis of SARS-CoV-2 by FDA under an Emergency Use Authorization (EUA). This EUA will remain in effect (meaning this test can be used) for the duration of the COVID-19 declaration under Section 564(b)(1) of the Act, 21 U.S.C. section 360bbb-3(b)(1), unless the authorization is terminated or revoked.  Performed at [facility], [address], [place], [place] [address]      Radiology Studies: CT Angio Chest Pulmonary Embolism (PE) W or WO Contrast  Result Date: 02/11/2021 CLINICAL DATA:  Dyspnea, hypoxia, COPD EXAM: CT ANGIOGRAPHY CHEST WITH CONTRAST TECHNIQUE: Multidetector CT imaging of the chest was performed using the standard protocol during bolus administration of intravenous contrast. Multiplanar CT image reconstructions and MIPs were obtained to evaluate the vascular anatomy. CONTRAST:  31mL OMNIPAQUE IOHEXOL 350 MG/ML SOLN COMPARISON:  01/28/2021 FINDINGS: Cardiovascular: There is adequate opacification of the pulmonary arterial tree. No intraluminal filling defect identified to suggest acute pulmonary embolism. The central pulmonary arteries are mildly enlarged in keeping with changes of pulmonary arterial hypertension. Extensive multi-vessel coronary artery calcification. Global cardiac size is within normal limits. No pericardial effusion. Moderate atherosclerotic calcification within the thoracic aorta. Small superimposed atheromatous plaque within the descending thoracic aorta without ulceration. No aortic aneurysm. Mediastinum/Nodes: Shotty precarinal adenopathy is stable. No additional pathologic thoracic adenopathy is identified. Esophagus unremarkable. Thyroid unremarkable. Lungs/Pleura: Mild emphysema. Parenchymal scarring with  rounded atelectasis noted within the basilar lingula and left-sided volume loss is again noted. Bibasilar cylindrical bronchiectasis again noted. Since the prior examination, there has developed scattered ground-glass infiltrate within the posterior segment of the right upper lobe and lateral segment of the right middle lobe. Additionally, there has developed progressive bronchial wall thickening and airway impaction. Together, the findings may reflect changes of superimposed acute inflammation or infection. Small right pleural effusion has developed. No pneumothorax. Upper Abdomen: No acute abnormality. Musculoskeletal: No chest wall abnormality. No acute or significant osseous findings. Review of the MIP images confirms the above findings. IMPRESSION: No pulmonary embolism. Interval development of scattered parenchymal infiltrate, progressive bronchiolar inflammation, and scattered airway impaction suggesting a superimposed acute infectious or inflammatory process. Interval development of a small right pleural effusion. Mild emphysema. Extensive multi-vessel coronary artery calcification. Morphologic changes in keeping with pulmonary arterial hypertension. Aortic Atherosclerosis (ICD10-I70.0) and Emphysema (ICD10-J43.9). Electronically Signed   By: Fidela Salisbury M.D.   On: 02/11/2021 19:42   DG Chest Port 1 View  Result Date: 02/11/2021 CLINICAL DATA:  Dyspnea, COPD, smoker EXAM: PORTABLE CHEST 1 VIEW COMPARISON:  01/28/2021 chest radiograph. FINDINGS: Stable cardiomediastinal silhouette with top normal heart size. No  pneumothorax. Loculated small to moderate lateral basilar left pleural effusion/thickening, mildly increased. Slight blunting of the right costophrenic angle. Cephalization of the pulmonary vasculature without overt pulmonary edema. Patchy and streaky opacities in the mid to lower left lung appear unchanged. IMPRESSION: 1. Loculated small to moderate lateral basilar left pleural  effusion/thickening, mildly increased. 2. Chronic patchy and streaky opacities in the mid to lower left lung, favor nonspecific scarring. 3. Slight blunting of the right costophrenic angle, possible small right pleural effusion. Electronically Signed   By: Delbert Phenix M.D.   On: 02/11/2021 11:26    Scheduled Meds:  budesonide  0.5 mg Nebulization BID   cyanocobalamin  1,000 mcg Intramuscular Daily   Followed by   Melene Muller ON 02/15/2021] vitamin B-12  1,000 mcg Oral Daily   diltiazem  180 mg Oral Daily   dronedarone  400 mg Oral BID WC   folic acid  1 mg Oral Daily   ipratropium-albuterol  3 mL Nebulization TID   rosuvastatin  5 mg Oral Daily   Continuous Infusions:  azithromycin Stopped (02/11/21 1430)   cefTRIAXone (ROCEPHIN)  IV      LOS: 1 day   Time spent: 40 mins  Laruen Risser Laural Benes, MD How to contact the [facility] Attending or Consulting provider 7A - 7P or covering provider during after hours 7P -7A, for this patient?  Check the care team in [facility] and look for a) attending/consulting TR[facility] provider listed and b) the [facility] team listed Log into [email or web address] and use [place]'s universal password to access. If you do not have the password, please contact the hospital operator. Locate the [facility] provider you are looking for under [facility] and page to a number that you can be directly reached. If you still have difficulty reaching the provider, please page the [facility] (Director on Call) for the Hospitalists listed on amion for assistance.  02/12/2021, 11:33 AM

## 2021-02-13 ENCOUNTER — Encounter (HOSPITAL_COMMUNITY): Payer: Self-pay | Admitting: Internal Medicine

## 2021-02-13 ENCOUNTER — Inpatient Hospital Stay (HOSPITAL_COMMUNITY): Payer: PPO

## 2021-02-13 DIAGNOSIS — E538 Deficiency of other specified B group vitamins: Secondary | ICD-10-CM | POA: Diagnosis not present

## 2021-02-13 DIAGNOSIS — R079 Chest pain, unspecified: Secondary | ICD-10-CM | POA: Diagnosis not present

## 2021-02-13 DIAGNOSIS — J90 Pleural effusion, not elsewhere classified: Secondary | ICD-10-CM | POA: Diagnosis not present

## 2021-02-13 DIAGNOSIS — Z7901 Long term (current) use of anticoagulants: Secondary | ICD-10-CM | POA: Diagnosis not present

## 2021-02-13 DIAGNOSIS — D649 Anemia, unspecified: Secondary | ICD-10-CM | POA: Diagnosis not present

## 2021-02-13 DIAGNOSIS — J9811 Atelectasis: Secondary | ICD-10-CM | POA: Diagnosis not present

## 2021-02-13 DIAGNOSIS — R0602 Shortness of breath: Secondary | ICD-10-CM | POA: Diagnosis not present

## 2021-02-13 DIAGNOSIS — I517 Cardiomegaly: Secondary | ICD-10-CM | POA: Diagnosis not present

## 2021-02-13 DIAGNOSIS — J9601 Acute respiratory failure with hypoxia: Secondary | ICD-10-CM | POA: Diagnosis not present

## 2021-02-13 LAB — GRAM STAIN

## 2021-02-13 LAB — GLUCOSE, PLEURAL OR PERITONEAL FLUID: Glucose, Fluid: 121 mg/dL

## 2021-02-13 LAB — CBC
HCT: 23.8 % — ABNORMAL LOW (ref 39.0–52.0)
Hemoglobin: 7 g/dL — ABNORMAL LOW (ref 13.0–17.0)
MCH: 29.3 pg (ref 26.0–34.0)
MCHC: 29.4 g/dL — ABNORMAL LOW (ref 30.0–36.0)
MCV: 99.6 fL (ref 80.0–100.0)
Platelets: 345 10*3/uL (ref 150–400)
RBC: 2.39 MIL/uL — ABNORMAL LOW (ref 4.22–5.81)
RDW: 14.1 % (ref 11.5–15.5)
WBC: 12.7 10*3/uL — ABNORMAL HIGH (ref 4.0–10.5)
nRBC: 0 % (ref 0.0–0.2)

## 2021-02-13 LAB — COMPREHENSIVE METABOLIC PANEL
ALT: 8 U/L (ref 0–44)
AST: 29 U/L (ref 15–41)
Albumin: 2.8 g/dL — ABNORMAL LOW (ref 3.5–5.0)
Alkaline Phosphatase: 56 U/L (ref 38–126)
Anion gap: 6 (ref 5–15)
BUN: 34 mg/dL — ABNORMAL HIGH (ref 8–23)
CO2: 27 mmol/L (ref 22–32)
Calcium: 8.6 mg/dL — ABNORMAL LOW (ref 8.9–10.3)
Chloride: 106 mmol/L (ref 98–111)
Creatinine, Ser: 1.32 mg/dL — ABNORMAL HIGH (ref 0.61–1.24)
GFR, Estimated: 57 mL/min — ABNORMAL LOW (ref >60)
Glucose, Bld: 115 mg/dL — ABNORMAL HIGH (ref 70–99)
Potassium: 5.2 mmol/L — ABNORMAL HIGH (ref 3.5–5.1)
Sodium: 139 mmol/L (ref 135–145)
Total Bilirubin: 0.5 mg/dL (ref 0.3–1.2)
Total Protein: 5.9 g/dL — ABNORMAL LOW (ref 6.5–8.1)

## 2021-02-13 LAB — BODY FLUID CELL COUNT WITH DIFFERENTIAL
Eos, Fluid: 5 %
Lymphs, Fluid: 28 %
Monocyte-Macrophage-Serous Fluid: 20 % — ABNORMAL LOW (ref 50–90)
Neutrophil Count, Fluid: 47 % — ABNORMAL HIGH (ref 0–25)
Total Nucleated Cell Count, Fluid: 783 cu mm (ref 0–1000)

## 2021-02-13 LAB — PROTEIN, PLEURAL OR PERITONEAL FLUID: Total protein, fluid: <3 g/dL

## 2021-02-13 LAB — PREPARE RBC (CROSSMATCH)

## 2021-02-13 LAB — MAGNESIUM: Magnesium: 2.4 mg/dL (ref 1.7–2.4)

## 2021-02-13 LAB — POTASSIUM: Potassium: 4.8 mmol/L (ref 3.5–5.1)

## 2021-02-13 MED ORDER — FUROSEMIDE 10 MG/ML IJ SOLN
30.0000 mg | Freq: Once | INTRAMUSCULAR | Status: AC
  Administered 2021-02-13: 10:00:00 30 mg via INTRAVENOUS
  Filled 2021-02-13: qty 4

## 2021-02-13 MED ORDER — PANTOPRAZOLE SODIUM 40 MG PO TBEC
40.0000 mg | DELAYED_RELEASE_TABLET | Freq: Two times a day (BID) | ORAL | Status: DC
  Administered 2021-02-13 – 2021-02-19 (×13): 40 mg via ORAL
  Filled 2021-02-13 (×13): qty 1

## 2021-02-13 MED ORDER — SODIUM CHLORIDE 0.9% IV SOLUTION: Freq: Once | INTRAVENOUS | Status: AC

## 2021-02-13 MED ORDER — MELATONIN 3 MG PO TABS
6.0000 mg | ORAL_TABLET | Freq: Every day | ORAL | Status: DC
  Administered 2021-02-14: 6 mg via ORAL
  Filled 2021-02-13: qty 2

## 2021-02-13 NOTE — Progress Notes (Signed)
PT tolerated right sided thoracentesis procedure well today and 600 mL of pleural fluid removed and labs sent for processing. PT verbalized understanding of post procedure instructions. PT left via stretcher to xray department at this with NAD noted.

## 2021-02-13 NOTE — Progress Notes (Signed)
Started A negative PRBC's at 1345, pre vitals recorded and stable. Pt laying in the bed and tolerating transfusion well. Pt monitored for the first 15 min. Vitals recorded and stable. Wife at bedside. Blood transfusion education provided.

## 2021-02-13 NOTE — Procedures (Signed)
° °  [place] of chest Revealing moderate Right pleural effusion Very small left pleural effusion  US guided Rt thoracentesis 600 cc yellow fluid  Tolerated well CXR: No ptx per Dr Tyron Russell

## 2021-02-13 NOTE — Progress Notes (Signed)
PROGRESS NOTE   Blake Cherry  WGN:[id number] DOB: [birth year] DOA: 02/11/2021 PCP: [name], MD   Chief Complaint  Patient presents with   Shortness of Breath   Level of care: Telemetry  Brief Admission History:  75 y.o. male with medical history significant for COPD, atrial flutter, CKD 3, hypertension, stroke.  presented to the ED with complaints of difficulty breathing of 2 to 3 days.  Reports difficulty breathing at rest, that worsens with exertion.  Reports he is unable to lie back to sleep due to difficulty breathing.,  And sleeps sitting up.  He also reports a cough that is dry, and nasal congestion.  He has chronic cough that has not significantly changed from his baseline.  He denies chest pain.  No lower extremity swelling.  He is on Eliquis and reports compliance.   He denies black stools, no blood in stools.  No vomiting.  No abdominal pain.  He takes 400 mg of ibuprofen very occasionally, on the average 2-3 times a month.  He was admitted with acute respiratory failure with hypoxia and acute blood loss anemia.    Assessment & Plan:   Principal Problem:   Acute respiratory failure with hypoxia (HCC) Active Problems:   B12 deficiency   Essential hypertension, benign   COPD severe GOLD III/ active smoker   COPD with acute exacerbation (HCC)   CRI (chronic renal insufficiency), stage 3 (moderate) (HCC)   Atrial flutter (HCC)   Anticoagulated   Acute anemia  Acute respiratory failure with hypoxia  -Multifactorial given pneumonia findings and acute anemia -Continue supplemental oxygen -CTA with findings of pneumonia but no PE  COPD exacerbation -Treating with scheduled IV steroids and bronchodilators and mucolytic's -Continue antibiotics as ordered  Moderate right pleural effusion  - s/p US thoracentesis 12/22, fluid sent for testing, cytology, gram stain, etc.   Acute anemia -Noted to have low B12 and folic acid levels -Follow FOBT -GI consultation  appreciated -Temporarily holding apixaban -Supplementing B12 and folic acid -Transfuse 1 unit PRBC 12/22   B12 and folic acid deficiency -Supplementation orders written to start 02/12/2021  Chronic atrial fibrillation -Temporarily holding apixaban in setting of acute anemia -Cardizem ordered for heart rate control  Stage IIIb CKD -Stable and following closely  DVT prophylaxis: SCDs Code Status: full  Family Communication: spouse  Disposition: anticipate DC home  Status is: Inpatient  Remains inpatient appropriate because: IV antibiotics required, GI bleed evaluation, transfuse PRBC    Consultants:  GI   Procedures:  N/a  Antimicrobials:  Ceftriaxone and azithromycin 12/20>>  Subjective: Patient reports ongoing shortness of breath, poor exercise tolerance, no chest pain today.  Objective: Vitals:   02/13/21 1405 02/13/21 1420 02/13/21 1430 02/13/21 1431  BP: (!) 104/52  (!) 110/56 (!) 110/56  Pulse: 86  76 76  Resp: 18  18 18   Temp: 97.8 F (36.6 C)  98.7 F (37.1 C) 98.7 F (37.1 C)  TempSrc: Oral   Oral  SpO2: 100% 98%  97%  Weight:      Height:        Intake/Output Summary (Last 24 hours) at 02/13/2021 1623 Last data filed at 02/13/2021 1500 Gross per 24 hour  Intake 252.5 ml  Output 250 ml  Net 2.5 ml   Filed Weights   02/11/21 1038  Weight: 73.9 kg    Examination:  General exam: Sitting up on the side of the bed, awake, alert appears calm and comfortable  Respiratory system: Poor air movement  bilaterally, moderate increased work of breathing. Diminshed BS RLL.  Cardiovascular system: normal S1 & S2 heard. No JVD, murmurs, rubs, gallops or clicks. No pedal edema. Gastrointestinal system: Abdomen is nondistended, soft and nontender. No organomegaly or masses felt. Normal bowel sounds heard. Central nervous system: Alert and oriented. No focal neurological deficits. Extremities: Symmetric 5 x 5 power. Skin: No rashes, lesions or  ulcers Psychiatry: Judgement and insight appear normal. Mood & affect appropriate.   Data Reviewed: I have personally reviewed following labs and imaging studies  CBC: Recent Labs  Lab 02/11/21 1208 02/12/21 0503 02/13/21 0457  WBC 10.9* 12.0* 12.7*  NEUTROABS 7.8*  --   --   HGB 7.8* 7.0* 7.0*  HCT 26.6* 23.8* 23.8*  MCV 102.7* 102.1* 99.6  PLT 354 338 [id number]    Basic Metabolic Panel: Recent Labs  Lab 02/11/21 1208 02/12/21 0503 02/13/21 0457 02/13/21 1031  NA 138 139 139  --   K 5.1 5.1 5.2* 4.8  CL 105 105 106  --   CO2 28 27 27   --   GLUCOSE 93 106* 115*  --   BUN 32* 33* 34*  --   CREATININE 1.46* 1.51* 1.32*  --   CALCIUM 8.2* 8.3* 8.6*  --   MG  --   --  2.4  --     GFR: Estimated Creatinine Clearance: 45.9 mL/min (A) (by C-G formula based on SCr of 1.32 mg/dL (H)).  Liver Function Tests: Recent Labs  Lab 02/11/21 1208 02/13/21 0457  AST 17 29  ALT 8 8  ALKPHOS 57 56  BILITOT 0.4 0.5  PROT 6.3* 5.9*  ALBUMIN 2.9* 2.8*    CBG: No results for input(s): GLUCAP in the last 168 hours.  Recent Results (from the past 240 hour(s))  Blood Culture (routine x 2)     Status: None (Preliminary result)   Collection Time: 02/11/21 12:15 PM   Specimen: Left Antecubital; Blood  Result Value Ref Range Status   Specimen Description LEFT ANTECUBITAL  Final   Special Requests   Final    BOTTLES DRAWN AEROBIC AND ANAEROBIC Blood Culture results may not be optimal due to an excessive volume of blood received in culture bottles   Culture   Final    NO GROWTH 2 DAYS Performed at [facility], [address], [place], [place] [address]    Report Status PENDING  Incomplete  Blood Culture (routine x 2)     Status: None (Preliminary result)   Collection Time: 02/11/21 12:19 PM   Specimen: Right Antecubital; Blood  Result Value Ref Range Status   Specimen Description RIGHT ANTECUBITAL  Final   Special Requests   Final    BOTTLES DRAWN AEROBIC AND ANAEROBIC Blood  Culture adequate volume   Culture   Final    NO GROWTH 2 DAYS Performed at [facility], [address], [place], [place] [address]    Report Status PENDING  Incomplete  Resp Panel by RT-PCR (Flu A&B, Covid) Nasopharyngeal Swab     Status: None   Collection Time: 02/11/21 12:25 PM   Specimen: Nasopharyngeal Swab; Nasopharyngeal(NP) swabs in vial transport medium  Result Value Ref Range Status   SARS Coronavirus 2 by RT PCR NEGATIVE NEGATIVE Final    Comment: (NOTE) SARS-CoV-2 target nucleic acids are NOT DETECTED.  The SARS-CoV-2 RNA is generally detectable in upper respiratory specimens during the acute phase of infection. The lowest concentration of SARS-CoV-2 viral copies this assay can detect is 138 copies/mL. A  negative result does not preclude SARS-Cov-2 infection and should not be used as the sole basis for treatment or other patient management decisions. A negative result may occur with  improper specimen collection/handling, submission of specimen other than nasopharyngeal swab, presence of viral mutation(s) within the areas targeted by this assay, and inadequate number of viral copies(<138 copies/mL). A negative result must be combined with clinical observations, patient history, and epidemiological information. The expected result is Negative.  Fact Sheet for Patients:  EntrepreneurPulse.com.au  Fact Sheet for Healthcare Providers:  IncredibleEmployment.be  This test is no t yet approved or cleared by the [place] FDA and  has been authorized for detection and/or diagnosis of SARS-CoV-2 by FDA under an Emergency Use Authorization (EUA). This EUA will remain  in effect (meaning this test can be used) for the duration of the COVID-19 declaration under Section 564(b)(1) of the Act, 21 U.S.C.section 360bbb-3(b)(1), unless the authorization is terminated  or revoked sooner.       Influenza A by PCR NEGATIVE NEGATIVE Final    Influenza B by PCR NEGATIVE NEGATIVE Final    Comment: (NOTE) The Xpert Xpress SARS-CoV-2/FLU/RSV plus assay is intended as an aid in the diagnosis of influenza from Nasopharyngeal swab specimens and should not be used as a sole basis for treatment. Nasal washings and aspirates are unacceptable for Xpert Xpress SARS-CoV-2/FLU/RSV testing.  Fact Sheet for Patients: EntrepreneurPulse.com.au  Fact Sheet for Healthcare Providers: IncredibleEmployment.be  This test is not yet approved or cleared by the [place] FDA and has been authorized for detection and/or diagnosis of SARS-CoV-2 by FDA under an Emergency Use Authorization (EUA). This EUA will remain in effect (meaning this test can be used) for the duration of the COVID-19 declaration under Section 564(b)(1) of the Act, 21 U.S.C. section 360bbb-3(b)(1), unless the authorization is terminated or revoked.  Performed at [facility], [address] [place], [place] [address]   Gram stain     Status: None   Collection Time: 02/13/21 11:35 AM   Specimen: Pleura  Result Value Ref Range Status   Specimen Description PLEURAL  Final   Special Requests NONE  Final   Gram Stain   Final    NO ORGANISMS SEEN WBC PRESENT,BOTH PMN AND MONONUCLEAR Gram Stain Report Called to,Read Back By and Verified With: A CRADDOCK RN [phone] Marcos Eke Performed at [facility], [address], [place], [place] [address]    Report Status 02/13/2021 FINAL  Final  Culture, body fluid w Gram Stain-bottle     Status: None (Preliminary result)   Collection Time: 02/13/21 11:35 AM   Specimen: Pleura  Result Value Ref Range Status   Specimen Description PLEURAL  Final   Special Requests   Final    BOTTLES DRAWN AEROBIC AND ANAEROBIC Blood Culture adequate volume Performed at [facility], [address] [place], [place] [address]    Culture PENDING  Incomplete   Report Status PENDING  Incomplete     Radiology  Studies: DG Chest 1 View  Result Date: 02/13/2021 CLINICAL DATA:  RIGHT pleural effusion post pneumothorax EXAM: CHEST  1 VIEW COMPARISON:  Earlier study of 02/13/2021 FINDINGS: Enlargement of cardiac silhouette with vascular congestion. Atherosclerotic calcification aorta. Loculated LEFT pleural effusion unchanged. Decreased RIGHT pleural effusion and basilar atelectasis. Persistent pulmonary infiltrates. RIGHT apical scarring. No definite pneumothorax or acute osseous findings. IMPRESSION: No pneumothorax following RIGHT thoracentesis. Remainder of exam unchanged. Electronically Signed   By: Lavonia Dana M.D.   On: 02/13/2021 12:06  CT Angio Chest Pulmonary Embolism (PE) W or WO Contrast  Result Date: 02/11/2021 CLINICAL DATA:  Dyspnea, hypoxia, COPD EXAM: CT ANGIOGRAPHY CHEST WITH CONTRAST TECHNIQUE: Multidetector CT imaging of the chest was performed using the standard protocol during bolus administration of intravenous contrast. Multiplanar CT image reconstructions and MIPs were obtained to evaluate the vascular anatomy. CONTRAST:  55mL OMNIPAQUE IOHEXOL 350 MG/ML SOLN COMPARISON:  01/28/2021 FINDINGS: Cardiovascular: There is adequate opacification of the pulmonary arterial tree. No intraluminal filling defect identified to suggest acute pulmonary embolism. The central pulmonary arteries are mildly enlarged in keeping with changes of pulmonary arterial hypertension. Extensive multi-vessel coronary artery calcification. Global cardiac size is within normal limits. No pericardial effusion. Moderate atherosclerotic calcification within the thoracic aorta. Small superimposed atheromatous plaque within the descending thoracic aorta without ulceration. No aortic aneurysm. Mediastinum/Nodes: Shotty precarinal adenopathy is stable. No additional pathologic thoracic adenopathy is identified. Esophagus unremarkable. Thyroid unremarkable. Lungs/Pleura: Mild emphysema. Parenchymal scarring with rounded  atelectasis noted within the basilar lingula and left-sided volume loss is again noted. Bibasilar cylindrical bronchiectasis again noted. Since the prior examination, there has developed scattered ground-glass infiltrate within the posterior segment of the right upper lobe and lateral segment of the right middle lobe. Additionally, there has developed progressive bronchial wall thickening and airway impaction. Together, the findings may reflect changes of superimposed acute inflammation or infection. Small right pleural effusion has developed. No pneumothorax. Upper Abdomen: No acute abnormality. Musculoskeletal: No chest wall abnormality. No acute or significant osseous findings. Review of the MIP images confirms the above findings. IMPRESSION: No pulmonary embolism. Interval development of scattered parenchymal infiltrate, progressive bronchiolar inflammation, and scattered airway impaction suggesting a superimposed acute infectious or inflammatory process. Interval development of a small right pleural effusion. Mild emphysema. Extensive multi-vessel coronary artery calcification. Morphologic changes in keeping with pulmonary arterial hypertension. Aortic Atherosclerosis (ICD10-I70.0) and Emphysema (ICD10-J43.9). Electronically Signed   By: Fidela Salisbury M.D.   On: 02/11/2021 19:42   DG CHEST PORT 1 VIEW  Result Date: 02/13/2021 CLINICAL DATA:  Shortness of breath, chest pain EXAM: PORTABLE CHEST 1 VIEW COMPARISON:  02/11/2021 FINDINGS: No significant change in AP portable chest radiograph with a moderate, loculated appearing left pleural effusion and a small right pleural effusion. Unchanged diffuse bilateral interstitial pulmonary opacity. Cardiomegaly. IMPRESSION: 1. No significant change in AP portable chest radiograph with a moderate, loculated appearing left pleural effusion and a small right pleural effusion. 2. Unchanged diffuse bilateral interstitial pulmonary opacity, consistent with edema or  infection. 3.  Cardiomegaly. Electronically Signed   By: Delanna Ahmadi M.D.   On: 02/13/2021 08:52   US THORACENTESIS ASP PLEURAL SPACE W/IMG GUIDE  Result Date: 02/13/2021 INDICATION: Moderate right pleural effusion Very small- left effusion EXAM: ULTRASOUND GUIDED right THORACENTESIS MEDICATIONS: 10 cc 1% lidocaine. COMPLICATIONS: None immediate. PROCEDURE: An ultrasound guided thoracentesis was thoroughly discussed with the patient and questions answered. The benefits, risks, alternatives and complications were also discussed. The patient understands and wishes to proceed with the procedure. Written consent was obtained. Ultrasound was performed to localize and mark an adequate pocket of fluid in the right chest. The area was then prepped and draped in the normal sterile fashion. 1% Lidocaine was used for local anesthesia. Under ultrasound guidance a Yueh catheter was introduced. Thoracentesis was performed. The catheter was removed and a dressing applied. FINDINGS: A total of approximately 600 cc of yellow fluid was removed. Samples were sent to the laboratory as requested by the clinical team. IMPRESSION: Successful ultrasound guided  right thoracentesis yielding 600 cc yellow fluid of pleural fluid. CXR: No ptx per Dr Thornton Papas Read by Lavonia Drafts [facility] Electronically Signed   By: Lavonia Dana M.D.   On: 02/13/2021 13:18    Scheduled Meds:  budesonide  0.5 mg Nebulization BID   cyanocobalamin  1,000 mcg Intramuscular Daily   Followed by   Derrill Memo ON 02/15/2021] vitamin B-12  1,000 mcg Oral Daily   diltiazem  180 mg Oral Daily   dronedarone  400 mg Oral BID WC   folic acid  1 mg Oral Daily   ipratropium-albuterol  3 mL Nebulization TID   pantoprazole  40 mg Oral BID   rosuvastatin  5 mg Oral Daily   Continuous Infusions:  azithromycin 500 mg (02/12/21 1305)   cefTRIAXone (ROCEPHIN)  IV 2 g (02/13/21 1243)    LOS: 2 days   Time spent: 35 mins  Rashan Rounsaville Wynetta Emery, MD How to contact the [facility]  Attending or Consulting provider [place] or covering provider during after hours [place], for this patient?  Check the care team in [facility] and look for a) attending/consulting [facility] provider listed and b) the [facility] team listed Log into [email or web address] and use [place]'s universal password to access. If you do not have the password, please contact the hospital operator. Locate the [facility] provider you are looking for under [facility] and page to a number that you can be directly reached. If you still have difficulty reaching the provider, please page the [facility] (Director on Call) for the Hospitalists listed on amion for assistance.  02/13/2021, 4:23 PM

## 2021-02-13 NOTE — Progress Notes (Signed)
Subjective:  Still significantly short of breath. Cannot speak in complete sentences. Denies abdominal pain. No overt GI bleeding. Has had some substernal chest pain overnight/this morning but states it is feeling better.   Objective: Vital signs in last 24 hours: Temp:  [97.8 F (36.6 C)-98.2 F (36.8 C)] 97.8 F (36.6 C) (12/22 0510) Pulse Rate:  [69-85] 85 (12/22 0510) Resp:  [18-19] 19 (12/22 0510) BP: (104-132)/(41-61) 132/61 (12/22 0510) SpO2:  [98 %-100 %] 98 % (12/22 0728) Last BM Date: 02/11/21 General:   Alert,  SOB. Cannot complete sentences without pausing to breath.  Head:  Normocephalic and atraumatic. Eyes:  Sclera clear, no icterus.  Chest: rhonchi in bases Heart:  Regular rate and rhythm; no murmurs, clicks, rubs,  or gallops. Abdomen:  Soft, nontender and nondistended.  Normal bowel sounds, without guarding, and without rebound.   Extremities:  Without clubbing, deformity. Trace to 1+ bilateral edema. Neurologic:  Alert and  oriented x4;  grossly normal neurologically. Skin:  Intact without significant lesions or rashes. Psych:  Alert and cooperative. Normal mood and affect.  Intake/Output from previous day: 12/21 0701 - 12/22 0700 In: 590 [P.O.:240; IV Piggyback:350] Out: 250 [Urine:250] Intake/Output this shift: No intake/output data recorded.  Lab Results: CBC Recent Labs    02/11/21 1208 02/12/21 0503 02/13/21 0457  WBC 10.9* 12.0* 12.7*  HGB 7.8* 7.0* 7.0*  HCT 26.6* 23.8* 23.8*  MCV 102.7* 102.1* 99.6  PLT 354 338 345   BMET Recent Labs    02/11/21 1208 02/12/21 0503 02/13/21 0457  NA 138 139 139  K 5.1 5.1 5.2*  CL 105 105 106  CO2 28 27 27   GLUCOSE 93 106* 115*  BUN 32* 33* 34*  CREATININE 1.46* 1.51* 1.32*  CALCIUM 8.2* 8.3* 8.6*   LFTs Recent Labs    02/11/21 1208 02/13/21 0457  BILITOT 0.4 0.5  ALKPHOS 57 56  AST 17 29  ALT 8 8  PROT 6.3* 5.9*  ALBUMIN 2.9* 2.8*   No results for input(s): LIPASE in the last 72  hours. PT/INR Recent Labs    02/11/21 1208  LABPROT 15.3*  INR 1.2   Lab Results  Component Value Date   FOLATE 5.4 (L) 02/12/2021   Lab Results  Component Value Date   IRON 34 (L) 02/12/2021   TIBC 319 02/12/2021   FERRITIN 12 (L) 02/12/2021   Lab Results  Component Value Date   VITAMINB12 83 (L) 02/12/2021       Imaging Studies: DG Chest 2 View  Result Date: 01/28/2021 CLINICAL DATA:  Chest pain EXAM: CHEST - 2 VIEW COMPARISON:  CT 07/09/2020 FINDINGS: Left-sided volume loss, peripheral mid lung parenchymal scarring, and laterally loculated pleural effusion within the left hemithorax is unchanged. Interstitial coarsening is again noted. No superimposed focal pulmonary infiltrate or nodule. Mild right apical pleuroparenchymal scarring again noted. No pneumothorax. No pleural effusion on the right. Cardiac size within normal limits. No acute bone abnormality. IMPRESSION: Stable parenchymal scarring and laterally loculated left-sided pleural effusion. No acute abnormality. Electronically Signed   By: 07/11/2020 M.D.   On: 01/28/2021 00:58   CT Angio Chest Pulmonary Embolism (PE) W or WO Contrast  Result Date: 02/11/2021 CLINICAL DATA:  Dyspnea, hypoxia, COPD EXAM: CT ANGIOGRAPHY CHEST WITH CONTRAST TECHNIQUE: Multidetector CT imaging of the chest was performed using the standard protocol during bolus administration of intravenous contrast. Multiplanar CT image reconstructions and MIPs were obtained to evaluate the vascular anatomy. CONTRAST:  22mL OMNIPAQUE IOHEXOL 350  MG/ML SOLN COMPARISON:  01/28/2021 FINDINGS: Cardiovascular: There is adequate opacification of the pulmonary arterial tree. No intraluminal filling defect identified to suggest acute pulmonary embolism. The central pulmonary arteries are mildly enlarged in keeping with changes of pulmonary arterial hypertension. Extensive multi-vessel coronary artery calcification. Global cardiac size is within normal limits. No  pericardial effusion. Moderate atherosclerotic calcification within the thoracic aorta. Small superimposed atheromatous plaque within the descending thoracic aorta without ulceration. No aortic aneurysm. Mediastinum/Nodes: Shotty precarinal adenopathy is stable. No additional pathologic thoracic adenopathy is identified. Esophagus unremarkable. Thyroid unremarkable. Lungs/Pleura: Mild emphysema. Parenchymal scarring with rounded atelectasis noted within the basilar lingula and left-sided volume loss is again noted. Bibasilar cylindrical bronchiectasis again noted. Since the prior examination, there has developed scattered ground-glass infiltrate within the posterior segment of the right upper lobe and lateral segment of the right middle lobe. Additionally, there has developed progressive bronchial wall thickening and airway impaction. Together, the findings may reflect changes of superimposed acute inflammation or infection. Small right pleural effusion has developed. No pneumothorax. Upper Abdomen: No acute abnormality. Musculoskeletal: No chest wall abnormality. No acute or significant osseous findings. Review of the MIP images confirms the above findings. IMPRESSION: No pulmonary embolism. Interval development of scattered parenchymal infiltrate, progressive bronchiolar inflammation, and scattered airway impaction suggesting a superimposed acute infectious or inflammatory process. Interval development of a small right pleural effusion. Mild emphysema. Extensive multi-vessel coronary artery calcification. Morphologic changes in keeping with pulmonary arterial hypertension. Aortic Atherosclerosis (ICD10-I70.0) and Emphysema (ICD10-J43.9). Electronically Signed   By: Fidela Salisbury M.D.   On: 02/11/2021 19:42   CT Angio Chest PE W and/or Wo Contrast  Result Date: 01/28/2021 CLINICAL DATA:  Chest pain and shortness of breath for 1 hour EXAM: CT ANGIOGRAPHY CHEST WITH CONTRAST TECHNIQUE: Multidetector CT imaging  of the chest was performed using the standard protocol during bolus administration of intravenous contrast. Multiplanar CT image reconstructions and MIPs were obtained to evaluate the vascular anatomy. CONTRAST:  53mL OMNIPAQUE IOHEXOL 350 MG/ML SOLN COMPARISON:  Chest x-ray from earlier in the same day, PET-CT from 07/29/2020. FINDINGS: Cardiovascular: Diffuse atherosclerotic calcifications of the thoracic aorta and its branches are noted. No aneurysmal dilatation or dissection is noted. Heart is mildly enlarged in size. Pulmonary artery shows a normal branching pattern. No fill coil filling defect to suggest pulmonary embolism is noted. Coronary calcifications are noted. Mediastinum/Nodes: Thoracic inlet is within normal limits. No sizable hilar or mediastinal adenopathy is noted. The esophagus as visualized is within normal limits. Lungs/Pleura: Emphysematous changes are identified particularly in the upper lobes bilaterally. The right lung is well aerated without focal infiltrate or effusion. Mild bronchial wall thickening is noted particularly in the lower lobe similar to that seen on prior PET-CT. Mild atelectatic changes are noted in the right lower lobe. Loculated pleural effusion is noted on the left stable in appearance from the prior PET-CT. Lingular somewhat bilobed nodular density is again identified similar to that seen on prior PET-CT. No new nodules are seen. Bronchial thickening is noted in the lower lobe on the left as well Upper Abdomen: Visualized upper abdomen is within normal limits. Musculoskeletal: Degenerative changes of the thoracic spine are seen. No acute rib abnormality is noted. No compression deformity is seen. Review of the MIP images confirms the above findings. IMPRESSION: No evidence of pulmonary emboli. Loculated left pleural effusion stable in appearance from prior PET-CT. Lingular somewhat bilobed nodular density is again identified similar to that seen on prior PET-CT.  Bronchial thickening in  the lower lobes bilaterally with mild atelectatic changes. Aortic Atherosclerosis (ICD10-I70.0) and Emphysema (ICD10-J43.9). Electronically Signed   By: Inez Catalina M.D.   On: 01/28/2021 03:59   DG Chest Port 1 View  Result Date: 02/11/2021 CLINICAL DATA:  Dyspnea, COPD, smoker EXAM: PORTABLE CHEST 1 VIEW COMPARISON:  01/28/2021 chest radiograph. FINDINGS: Stable cardiomediastinal silhouette with top normal heart size. No pneumothorax. Loculated small to moderate lateral basilar left pleural effusion/thickening, mildly increased. Slight blunting of the right costophrenic angle. Cephalization of the pulmonary vasculature without overt pulmonary edema. Patchy and streaky opacities in the mid to lower left lung appear unchanged. IMPRESSION: 1. Loculated small to moderate lateral basilar left pleural effusion/thickening, mildly increased. 2. Chronic patchy and streaky opacities in the mid to lower left lung, favor nonspecific scarring. 3. Slight blunting of the right costophrenic angle, possible small right pleural effusion. Electronically Signed   By: Ilona Sorrel M.D.   On: 02/11/2021 11:26  [2 weeks]   Assessment:  75 y/o male with PMH of Afib on Eliquis, COPD, Aflutter, CKD III, HTN, previous CVA presenting to the ED with 2-3 day h/o worsening SOB. Noted to have hemoglobin of 7.8 on presentation.   Anemia: Hgb 7.8 on admission, 10.6 two weeks ago, but baseline Hgb of 14.1 one year ago. Folate 5.4, B12 83, iron 34, ferritin 12. BUN elevated at 33. Patient chronically anticoagulated on Eliquis and takes ASA 81 mg daily, naproxen 1-2 per week. Last colonoscopy 2007, EGD before that. He denies melena, brbpr, other overt bleeding. He would benefit from EGD and colonoscopy once respiratory status improves.  Would be concerned about occult GI bleeding.    Plan: B12 and folate replacement per attending.  He would benefit from unit of prbcs given significant decline in Hgb recently  and overall poor respiratory status.  Would benefit from EGD + colonoscopy once respiratory status is more stable.  Avoid NSAIDs.  Add PPI.  Monitor for overt GI bleeding/significant decline in Hgb.  Will follow peripherally for now, please contact GI with any acute changes in Hgb or signs of overt bleeding in the interim.   Laureen Ochs. Bernarda Caffey [facility] [phone] 12/22/202210:53 AM    LOS: 2 days

## 2021-02-14 DIAGNOSIS — J9601 Acute respiratory failure with hypoxia: Secondary | ICD-10-CM | POA: Diagnosis not present

## 2021-02-14 DIAGNOSIS — Z7901 Long term (current) use of anticoagulants: Secondary | ICD-10-CM | POA: Diagnosis not present

## 2021-02-14 DIAGNOSIS — E538 Deficiency of other specified B group vitamins: Secondary | ICD-10-CM | POA: Diagnosis not present

## 2021-02-14 DIAGNOSIS — J441 Chronic obstructive pulmonary disease with (acute) exacerbation: Secondary | ICD-10-CM | POA: Diagnosis not present

## 2021-02-14 LAB — TYPE AND SCREEN
ABO/RH(D): A POS
Antibody Screen: NEGATIVE
Unit division: 0

## 2021-02-14 LAB — BPAM RBC
Blood Product Expiration Date: 202212282359
ISSUE DATE / TIME: 202212221405
Unit Type and Rh: 600

## 2021-02-14 LAB — MAGNESIUM: Magnesium: 2.2 mg/dL (ref 1.7–2.4)

## 2021-02-14 LAB — COMPREHENSIVE METABOLIC PANEL
ALT: 9 U/L (ref 0–44)
AST: 23 U/L (ref 15–41)
Albumin: 2.8 g/dL — ABNORMAL LOW (ref 3.5–5.0)
Alkaline Phosphatase: 57 U/L (ref 38–126)
Anion gap: 9 (ref 5–15)
BUN: 26 mg/dL — ABNORMAL HIGH (ref 8–23)
CO2: 28 mmol/L (ref 22–32)
Calcium: 8.4 mg/dL — ABNORMAL LOW (ref 8.9–10.3)
Chloride: 101 mmol/L (ref 98–111)
Creatinine, Ser: 1.43 mg/dL — ABNORMAL HIGH (ref 0.61–1.24)
GFR, Estimated: 51 mL/min — ABNORMAL LOW (ref >60)
Glucose, Bld: 95 mg/dL (ref 70–99)
Potassium: 4 mmol/L (ref 3.5–5.1)
Sodium: 138 mmol/L (ref 135–145)
Total Bilirubin: 0.7 mg/dL (ref 0.3–1.2)
Total Protein: 6.1 g/dL — ABNORMAL LOW (ref 6.5–8.1)

## 2021-02-14 LAB — CBC
HCT: 26.5 % — ABNORMAL LOW (ref 39.0–52.0)
Hemoglobin: 8.4 g/dL — ABNORMAL LOW (ref 13.0–17.0)
MCH: 30.7 pg (ref 26.0–34.0)
MCHC: 31.7 g/dL (ref 30.0–36.0)
MCV: 96.7 fL (ref 80.0–100.0)
Platelets: 368 10*3/uL (ref 150–400)
RBC: 2.74 MIL/uL — ABNORMAL LOW (ref 4.22–5.81)
RDW: 15.3 % (ref 11.5–15.5)
WBC: 14.2 10*3/uL — ABNORMAL HIGH (ref 4.0–10.5)
nRBC: 0 % (ref 0.0–0.2)

## 2021-02-14 LAB — AMYLASE, BODY FLUID (OTHER): Amylase, Body Fluid: 31 U/L

## 2021-02-14 LAB — CYTOLOGY - NON PAP

## 2021-02-14 MED ORDER — AZITHROMYCIN 250 MG PO TABS
500.0000 mg | ORAL_TABLET | Freq: Every day | ORAL | Status: AC
  Administered 2021-02-14 – 2021-02-15 (×2): 500 mg via ORAL
  Filled 2021-02-14 (×2): qty 2

## 2021-02-14 MED ORDER — MELATONIN 3 MG PO TABS
6.0000 mg | ORAL_TABLET | Freq: Every day | ORAL | Status: DC
  Administered 2021-02-14 – 2021-02-18 (×5): 6 mg via ORAL
  Filled 2021-02-14 (×5): qty 2

## 2021-02-14 MED ORDER — GUAIFENESIN ER 600 MG PO TB12
1200.0000 mg | ORAL_TABLET | Freq: Two times a day (BID) | ORAL | Status: DC
  Administered 2021-02-14 – 2021-02-19 (×10): 1200 mg via ORAL
  Filled 2021-02-14 (×10): qty 2

## 2021-02-14 NOTE — Progress Notes (Signed)
PROGRESS NOTE   Blake Cherry  [id number] DOB: 01/27/1946 DOA: 02/11/2021 PCP: Kathyrn Drown, MD   Chief Complaint  Patient presents with   Shortness of Breath   Level of care: Telemetry  Brief Admission History:  75 y.o. male with medical history significant for COPD, atrial flutter, CKD 3, hypertension, stroke.  presented to the ED with complaints of difficulty breathing of 2 to 3 days.  Reports difficulty breathing at rest, that worsens with exertion.  Reports he is unable to lie back to sleep due to difficulty breathing.,  And sleeps sitting up.  He also reports a cough that is dry, and nasal congestion.  He has chronic cough that has not significantly changed from his baseline.  He denies chest pain.  No lower extremity swelling.  He is on Eliquis and reports compliance.   He denies black stools, no blood in stools.  No vomiting.  No abdominal pain.  He takes 400 mg of ibuprofen very occasionally, on the average 2-3 times a month.  He was admitted with acute respiratory failure with hypoxia and acute blood loss anemia.    Assessment & Plan:   Principal Problem:   Acute respiratory failure with hypoxia (HCC) Active Problems:   B12 deficiency   Essential hypertension, benign   COPD severe GOLD III/ active smoker   COPD with acute exacerbation (HCC)   CRI (chronic renal insufficiency), stage 3 (moderate) (HCC)   Atrial flutter (HCC)   Anticoagulated   Acute anemia  Acute respiratory failure with hypoxia  -Multifactorial given pneumonia findings and acute anemia -Continue supplemental oxygen -CTA with findings of pneumonia but no PE  COPD exacerbation -Treating with scheduled IV steroids and bronchodilators and mucolytic's -Continue antibiotics as ordered  Moderate right pleural effusion  - s/p US thoracentesis 12/22, fluid sent for testing, cytology, gram stain, etc.   Loculated left pleural effusion  - outpatient pulmonology follow up   Acute anemia -Noted to  have low 123456 and folic acid levels -Follow FOBT -GI consultation appreciated -Temporarily holding apixaban -Supplementing 123456 and folic acid -Transfuse 1 unit PRBC [id number]   123456 and folic acid deficiency -Supplementation orders written to start 02/12/2021  Chronic atrial fibrillation -Temporarily holding apixaban in setting of acute anemia -Cardizem ordered for heart rate control  Stage IIIb CKD -Stable and following closely  DVT prophylaxis: SCDs Code Status: full  Family Communication: spouse  Disposition: anticipate DC home  Status is: Inpatient  Remains inpatient appropriate because: IV antibiotics required, GI bleed evaluation, transfuse PRBC    Consultants:  GI   Procedures:  N/a  Antimicrobials:  Ceftriaxone and azithromycin 12/20>>  Subjective: Patient says breathing slightly improved after the thoracentesis 12/22   Objective: Vitals:   02/14/21 0827 02/14/21 0832 02/14/21 1207 02/14/21 1434  BP:   (!) 93/49 (!) 110/57  Pulse:   82 83  Resp:   20 20  Temp:   98 F (36.7 C)   TempSrc:   Oral   SpO2: 95% 100% 97% 95%  Weight:      Height:        Intake/Output Summary (Last 24 hours) at 02/14/2021 1540 Last data filed at 02/14/2021 1100 Gross per 24 hour  Intake 830 ml  Output 1250 ml  Net -420 ml   Filed Weights   02/11/21 1038  Weight: 73.9 kg    Examination:  General exam: Sitting up on the side of the bed, awake, alert appears calm and comfortable  Respiratory system: Poor  air movement bilaterally, moderate increased work of breathing. Diminshed BS LLL.  Cardiovascular system: normal S1 & S2 heard. No JVD, murmurs, rubs, gallops or clicks. No pedal edema. Gastrointestinal system: Abdomen is nondistended, soft and nontender. No organomegaly or masses felt. Normal bowel sounds heard. Central nervous system: Alert and oriented. No focal neurological deficits. Extremities: Symmetric 5 x 5 power. Skin: No rashes, lesions or ulcers Psychiatry:  Judgement and insight appear normal. Mood & affect appropriate.   Data Reviewed: I have personally reviewed following labs and imaging studies  CBC: Recent Labs  Lab 02/11/21 1208 02/12/21 0503 02/13/21 0457 02/14/21 0508  WBC 10.9* 12.0* 12.7* 14.2*  NEUTROABS 7.8*  --   --   --   HGB 7.8* 7.0* 7.0* 8.4*  HCT 26.6* 23.8* 23.8* 26.5*  MCV 102.7* 102.1* 99.6 96.7  PLT 354 338 345 368    Basic Metabolic Panel: Recent Labs  Lab 02/11/21 1208 02/12/21 0503 02/13/21 0457 02/13/21 1031 02/14/21 0508  NA 138 139 139  --  138  K 5.1 5.1 5.2* 4.8 4.0  CL 105 105 106  --  101  CO2 28 27 27   --  28  GLUCOSE 93 106* 115*  --  95  BUN 32* 33* 34*  --  26*  CREATININE 1.46* 1.51* 1.32*  --  1.43*  CALCIUM 8.2* 8.3* 8.6*  --  8.4*  MG  --   --  2.4  --  2.2    GFR: Estimated Creatinine Clearance: 42.4 mL/min (A) (by C-G formula based on SCr of 1.43 mg/dL (H)).  Liver Function Tests: Recent Labs  Lab 02/11/21 1208 02/13/21 0457 02/14/21 0508  AST 17 29 23   ALT 8 8 9   ALKPHOS 57 56 57  BILITOT 0.4 0.5 0.7  PROT 6.3* 5.9* 6.1*  ALBUMIN 2.9* 2.8* 2.8*    CBG: No results for input(s): GLUCAP in the last 168 hours.  Recent Results (from the past 240 hour(s))  Blood Culture (routine x 2)     Status: None (Preliminary result)   Collection Time: 02/11/21 12:15 PM   Specimen: Left Antecubital; Blood  Result Value Ref Range Status   Specimen Description LEFT ANTECUBITAL  Final   Special Requests   Final    BOTTLES DRAWN AEROBIC AND ANAEROBIC Blood Culture results may not be optimal due to an excessive volume of blood received in culture bottles   Culture   Final    NO GROWTH 3 DAYS Performed at [id number], [address], [place], [facility] [address]    Report Status PENDING  Incomplete  Blood Culture (routine x 2)     Status: None (Preliminary result)   Collection Time: 02/11/21 12:19 PM   Specimen: Right Antecubital; Blood  Result Value Ref Range Status   Specimen  Description RIGHT ANTECUBITAL  Final   Special Requests   Final    BOTTLES DRAWN AEROBIC AND ANAEROBIC Blood Culture adequate volume   Culture   Final    NO GROWTH 3 DAYS Performed at [facility], [address] [place], [facility] [address]    Report Status PENDING  Incomplete  Resp Panel by RT-PCR (Flu A&B, Covid) Nasopharyngeal Swab     Status: None   Collection Time: 02/11/21 12:25 PM   Specimen: Nasopharyngeal Swab; Nasopharyngeal(NP) swabs in vial transport medium  Result Value Ref Range Status   SARS Coronavirus 2 by RT PCR NEGATIVE NEGATIVE Final    Comment: (NOTE) SARS-CoV-2 target nucleic acids are NOT DETECTED.  The SARS-CoV-2 RNA is generally detectable in upper respiratory specimens during the acute phase of infection. The lowest concentration of SARS-CoV-2 viral copies this assay can detect is 138 copies/mL. A negative result does not preclude SARS-Cov-2 infection and should not be used as the sole basis for treatment or other patient management decisions. A negative result may occur with  improper specimen collection/handling, submission of specimen other than nasopharyngeal swab, presence of viral mutation(s) within the areas targeted by this assay, and inadequate number of viral copies(<138 copies/mL). A negative result must be combined with clinical observations, patient history, and epidemiological information. The expected result is Negative.  Fact Sheet for Patients:  EntrepreneurPulse.com.au  Fact Sheet for Healthcare Providers:  IncredibleEmployment.be  This test is no t yet approved or cleared by the [place] FDA and  has been authorized for detection and/or diagnosis of SARS-CoV-2 by FDA under an Emergency Use Authorization (EUA). This EUA will remain  in effect (meaning this test can be used) for the duration of the COVID-19 declaration under Section 564(b)(1) of the Act, 21 U.S.C.section 360bbb-3(b)(1), unless  the authorization is terminated  or revoked sooner.       Influenza A by PCR NEGATIVE NEGATIVE Final   Influenza B by PCR NEGATIVE NEGATIVE Final    Comment: (NOTE) The Xpert Xpress SARS-CoV-2/FLU/RSV plus assay is intended as an aid in the diagnosis of influenza from Nasopharyngeal swab specimens and should not be used as a sole basis for treatment. Nasal washings and aspirates are unacceptable for Xpert Xpress SARS-CoV-2/FLU/RSV testing.  Fact Sheet for Patients: EntrepreneurPulse.com.au  Fact Sheet for Healthcare Providers: IncredibleEmployment.be  This test is not yet approved or cleared by the [place] FDA and has been authorized for detection and/or diagnosis of SARS-CoV-2 by FDA under an Emergency Use Authorization (EUA). This EUA will remain in effect (meaning this test can be used) for the duration of the COVID-19 declaration under Section 564(b)(1) of the Act, 21 U.S.C. section 360bbb-3(b)(1), unless the authorization is terminated or revoked.  Performed at [facility], [address], [place], [place] [address]   Gram stain     Status: None   Collection Time: 02/13/21 11:35 AM   Specimen: Pleura  Result Value Ref Range Status   Specimen Description PLEURAL  Final   Special Requests NONE  Final   Gram Stain   Final    NO ORGANISMS SEEN WBC PRESENT,BOTH PMN AND MONONUCLEAR Gram Stain Report Called to,Read Back By and Verified With: A CRADDOCK RN [phone] Marcos Eke Performed at [facility], [address], [place], [place] [address]    Report Status 02/13/2021 FINAL  Final  Culture, body fluid w Gram Stain-bottle     Status: None (Preliminary result)   Collection Time: 02/13/21 11:35 AM   Specimen: Pleura  Result Value Ref Range Status   Specimen Description PLEURAL  Final   Special Requests   Final    BOTTLES DRAWN AEROBIC AND ANAEROBIC Blood Culture adequate volume   Culture   Final    NO GROWTH < 24  HOURS Performed at [facility], [address], [place], [place] [address]    Report Status PENDING  Incomplete     Radiology Studies: DG Chest 1 View  Result Date: 02/13/2021 CLINICAL DATA:  RIGHT pleural effusion post pneumothorax EXAM: CHEST  1 VIEW COMPARISON:  Earlier study of 02/13/2021 FINDINGS: Enlargement of cardiac silhouette with vascular congestion. Atherosclerotic calcification aorta. Loculated LEFT pleural effusion unchanged. Decreased RIGHT pleural effusion and  basilar atelectasis. Persistent pulmonary infiltrates. RIGHT apical scarring. No definite pneumothorax or acute osseous findings. IMPRESSION: No pneumothorax following RIGHT thoracentesis. Remainder of exam unchanged. Electronically Signed   By: Lavonia Dana M.D.   On: 02/13/2021 12:06   DG CHEST PORT 1 VIEW  Result Date: 02/13/2021 CLINICAL DATA:  Shortness of breath, chest pain EXAM: PORTABLE CHEST 1 VIEW COMPARISON:  02/11/2021 FINDINGS: No significant change in AP portable chest radiograph with a moderate, loculated appearing left pleural effusion and a small right pleural effusion. Unchanged diffuse bilateral interstitial pulmonary opacity. Cardiomegaly. IMPRESSION: 1. No significant change in AP portable chest radiograph with a moderate, loculated appearing left pleural effusion and a small right pleural effusion. 2. Unchanged diffuse bilateral interstitial pulmonary opacity, consistent with edema or infection. 3.  Cardiomegaly. Electronically Signed   By: Delanna Ahmadi M.D.   On: 02/13/2021 08:52   US THORACENTESIS ASP PLEURAL SPACE W/IMG GUIDE  Result Date: 02/13/2021 INDICATION: Moderate right pleural effusion Very small- left effusion EXAM: ULTRASOUND GUIDED right THORACENTESIS MEDICATIONS: 10 cc 1% lidocaine. COMPLICATIONS: None immediate. PROCEDURE: An ultrasound guided thoracentesis was thoroughly discussed with the patient and questions answered. The benefits, risks, alternatives and complications were also  discussed. The patient understands and wishes to proceed with the procedure. Written consent was obtained. Ultrasound was performed to localize and mark an adequate pocket of fluid in the right chest. The area was then prepped and draped in the normal sterile fashion. 1% Lidocaine was used for local anesthesia. Under ultrasound guidance a Yueh catheter was introduced. Thoracentesis was performed. The catheter was removed and a dressing applied. FINDINGS: A total of approximately 600 cc of yellow fluid was removed. Samples were sent to the laboratory as requested by the clinical team. IMPRESSION: Successful ultrasound guided right thoracentesis yielding 600 cc yellow fluid of pleural fluid. CXR: No ptx per Dr Thornton Papas Read by Lavonia Drafts [facility] Electronically Signed   By: Lavonia Dana M.D.   On: 02/13/2021 13:18    Scheduled Meds:  azithromycin  500 mg Oral Daily   budesonide  0.5 mg Nebulization BID   diltiazem  180 mg Oral Daily   dronedarone  400 mg Oral BID WC   folic acid  1 mg Oral Daily   guaiFENesin  1,200 mg Oral BID   ipratropium-albuterol  3 mL Nebulization TID   melatonin  6 mg Oral QHS   pantoprazole  40 mg Oral BID   rosuvastatin  5 mg Oral Daily   [START ON 02/15/2021] vitamin B-12  1,000 mcg Oral Daily   Continuous Infusions:  cefTRIAXone (ROCEPHIN)  IV 2 g (02/14/21 1306)    LOS: 3 days   Time spent: 35 mins  Fayette Gasner Wynetta Emery, MD How to contact the [facility] Attending or Consulting provider [place] or covering provider during after hours [place], for this patient?  Check the care team in [facility] and look for a) attending/consulting [facility] provider listed and b) the [facility] team listed Log into [email or web address] and use [place]'s universal password to access. If you do not have the password, please contact the hospital operator. Locate the [facility] provider you are looking for under [facility] and page to a number that you can be directly reached. If you still have difficulty reaching the  provider, please page the [facility] (Director on Call) for the Hospitalists listed on amion for assistance.  02/14/2021, 3:40 PM

## 2021-02-14 NOTE — Progress Notes (Signed)
Physical Therapy Treatment Patient Details Name: Blake Cherry MRN: [id number] DOB: Jan 29, 1946 Today's Date: 02/14/2021   History of Present Illness Blake Cherry is a 75 y.o. male with medical history significant for COPD, atrial flutter, CKD 3, hypertension, stroke.  Patient presented to the ED with complaints of difficulty breathing of 2 to 3 days.  Reports difficulty breathing at rest, that worsens with exertion.  Reports he is unable to lie back to sleep due to difficulty breathing.,  And sleeps sitting up.  He also reports a cough that is dry, and nasal congestion.  He has chronic cough that has not significantly changed from his baseline.  He denies chest pain.  No lower extremity swelling.  He is on Eliquis and reports compliance.   He denies black stools, no blood in stools.  No vomiting.  No abdominal pain.  He takes 400 mg of ibuprofen very occasionally, on the average 2-3 times a month.     Patient's wife is on home O2, over the past few days, he has had to share his wife's oxygen.    PT Comments    Pt reports feeling SOB at rest in recliner on 4 L O2 at humidifer.  Demo 96% saturation and BP of 109/54 mmHg and 81 bpm at rest.  Able to perform transfers independently with some c/o lightheadedness.  Pt proceeded to ambulate with supervision requiring touch support on wall/furniture for steadying and reports increased DOE and feeling of SOB/fatigue requiring seated rest period.  No change in vital signs with exertion noted.  Continued with seated LE exercises to improve activity tolerance. Continued sessions indicated to improve independence, safety, and efficiency with ambulation. May benefit from use of four-wheeled walker to improve activity tolerance/support.     Recommendations for follow up therapy are one component of a multi-disciplinary discharge planning process, led by the attending physician.  Recommendations may be updated based on patient status, additional functional  criteria and insurance authorization.  Follow Up Recommendations  Home health PT     Assistance Recommended at Discharge PRN  Equipment Recommendations  None recommended by PT    Recommendations for Other Services       Precautions / Restrictions       Mobility  Bed Mobility Overal bed mobility: Independent               Patient Response: Anxious  Transfers Overall transfer level: Independent                      Ambulation/Gait Ambulation/Gait assistance: Supervision;Min guard Gait Distance (Feet): 20 Feet Assistive device: None Gait Pattern/deviations: Decreased step length - right;Decreased step length - left;Decreased stride length       General Gait Details: slow labored cadence with occasional leaning on nearby objects for suppor once fatigued and limited mostly due to SOB   Stairs             Wheelchair Mobility    Modified Rankin (Stroke Patients Only)       Balance                                            Cognition  Exercises General Exercises - Lower Extremity Ankle Circles/Pumps: Strengthening;Both;20 reps Long Arc Quad: Strengthening;Both;20 reps Hip ABduction/ADduction: Strengthening;Both;Seated;20 reps    General Comments        Pertinent Vitals/Pain      Home Living                          Prior Function            PT Goals (current goals can now be found in the care plan section) Acute Rehab PT Goals Patient Stated Goal: return home with family to assist PT Goal Formulation: With patient/family Time For Goal Achievement: 02/17/21 Potential to Achieve Goals: Good Progress towards PT goals: Progressing toward goals    Frequency    Min 2X/week      PT Plan Current plan remains appropriate    Co-evaluation              AM-PAC PT "6 Clicks" Mobility   Outcome Measure  Help needed turning  from your back to your side while in a flat bed without using bedrails?: None Help needed moving from lying on your back to sitting on the side of a flat bed without using bedrails?: None Help needed moving to and from a bed to a chair (including a wheelchair)?: None Help needed standing up from a chair using your arms (e.g., wheelchair or bedside chair)?: None Help needed to walk in hospital room?: A Little Help needed climbing 3-5 steps with a railing? : A Little 6 Click Score: 22    End of Session Equipment Utilized During Treatment: Oxygen Activity Tolerance: Patient limited by fatigue Patient left: in chair;with call bell/phone within reach;with family/visitor present Nurse Communication: Mobility status PT Visit Diagnosis: Unsteadiness on feet (R26.81);Other abnormalities of gait and mobility (R26.89);Muscle weakness (generalized) (M62.81)     Time: 3151-7616 PT Time Calculation (min) (ACUTE ONLY): 20 min  Charges:  $Gait Training: 8-22 mins $Therapeutic Exercise: 8-22 mins                    3:52 PM, 02/14/21 M. Shary Decamp, PT, DPT Physical Therapist- [place] Office Number: [phone]

## 2021-02-14 NOTE — Care Management Important Message (Signed)
Important Message  Patient Details  Name: Blake Cherry MRN: [id number] Date of Birth: Jul 20, 1945   Medicare Important Message Given:  Yes     Corey Harold 02/14/2021, 12:51 PM

## 2021-02-15 ENCOUNTER — Encounter (HOSPITAL_COMMUNITY): Payer: Self-pay | Admitting: Internal Medicine

## 2021-02-15 DIAGNOSIS — J441 Chronic obstructive pulmonary disease with (acute) exacerbation: Secondary | ICD-10-CM | POA: Diagnosis not present

## 2021-02-15 DIAGNOSIS — Z7901 Long term (current) use of anticoagulants: Secondary | ICD-10-CM | POA: Diagnosis not present

## 2021-02-15 DIAGNOSIS — E538 Deficiency of other specified B group vitamins: Secondary | ICD-10-CM | POA: Diagnosis not present

## 2021-02-15 DIAGNOSIS — J9601 Acute respiratory failure with hypoxia: Secondary | ICD-10-CM | POA: Diagnosis not present

## 2021-02-15 LAB — CBC
HCT: 24.8 % — ABNORMAL LOW (ref 39.0–52.0)
Hemoglobin: 7.6 g/dL — ABNORMAL LOW (ref 13.0–17.0)
MCH: 29.8 pg (ref 26.0–34.0)
MCHC: 30.6 g/dL (ref 30.0–36.0)
MCV: 97.3 fL (ref 80.0–100.0)
Platelets: 328 10*3/uL (ref 150–400)
RBC: 2.55 MIL/uL — ABNORMAL LOW (ref 4.22–5.81)
RDW: 14.7 % (ref 11.5–15.5)
WBC: 12.2 10*3/uL — ABNORMAL HIGH (ref 4.0–10.5)
nRBC: 0 % (ref 0.0–0.2)

## 2021-02-15 MED ORDER — SENNOSIDES-DOCUSATE SODIUM 8.6-50 MG PO TABS
2.0000 | ORAL_TABLET | Freq: Every day | ORAL | Status: AC
  Administered 2021-02-17: 22:00:00 2 via ORAL
  Filled 2021-02-15 (×2): qty 2

## 2021-02-15 NOTE — Progress Notes (Signed)
PROGRESS NOTE   Blake Cherry  [id number] DOB: Oct 17, 1945 DOA: 02/11/2021 PCP: Kathyrn Drown, MD   Chief Complaint  Patient presents with   Shortness of Breath   Level of care: Med-Surg  Brief Admission History:  75 y.o. male with medical history significant for COPD, atrial flutter, CKD 3, hypertension, stroke.  presented to the ED with complaints of difficulty breathing of 2 to 3 days.  Reports difficulty breathing at rest, that worsens with exertion.  Reports he is unable to lie back to sleep due to difficulty breathing.,  And sleeps sitting up.  He also reports a cough that is dry, and nasal congestion.  He has chronic cough that has not significantly changed from his baseline.  He denies chest pain.  No lower extremity swelling.  He is on Eliquis and reports compliance.   He denies black stools, no blood in stools.  No vomiting.  No abdominal pain.  He takes 400 mg of ibuprofen very occasionally, on the average 2-3 times a month.  He was admitted with acute respiratory failure with hypoxia and acute blood loss anemia.    Assessment & Plan:   Principal Problem:   Acute respiratory failure with hypoxia (HCC) Active Problems:   B12 deficiency   Essential hypertension, benign   COPD severe GOLD III/ active smoker   COPD with acute exacerbation (HCC)   CRI (chronic renal insufficiency), stage 3 (moderate) (HCC)   Atrial flutter (HCC)   Anticoagulated   Acute anemia  Acute respiratory failure with hypoxia  -Multifactorial given pneumonia findings and acute anemia -Continue supplemental oxygen -CTA with findings of pneumonia but no PE  COPD exacerbation -Continue treating with scheduled IV steroids and bronchodilators and mucolytic's -Continue antibiotics as ordered  Moderate right pleural effusion  - s/p US thoracentesis 12/22, fluid sent for testing, cytology, gram stain, etc.  - results c/w reactive mesothelial cells   Loculated left pleural effusion  - outpatient  pulmonology follow up  - If dyspnea doesn't improve will ask for pulmonary consultation  - not available until 12/26 at this hospital  Acute anemia -Noted to have low 123456 and folic acid levels -Follow FOBT, still waiting on BM, peri-colace ordered 12/24 -GI consultation appreciated -Temporarily holding apixaban -Supplementing 123456 and folic acid -Transfused 1 unit PRBC 12/22  -Unfortunately Hg trending back down today, now 7.6 from 8.4  -Recheck CBC in AM. Transfuse for Hg<7.   123456 and folic acid deficiency -Supplementation orders written to start 02/12/2021  Chronic atrial fibrillation -Temporarily holding apixaban in setting of acute anemia per GI team recommendations -Cardizem ordered for heart rate control  Stage IIIb CKD -Stable and following closely  DVT prophylaxis: SCDs Code Status: full  Family Communication: spouse  Disposition: anticipate DC home  Status is: Inpatient  Remains inpatient appropriate because: IV antibiotics required, GI bleed evaluation, transfuse PRBC, may need inpatient endoscopy as Hg continues to trend down     Consultants:  GI   Procedures:  N/a  Antimicrobials:  Ceftriaxone and azithromycin 12/20>>  Subjective:  Pt continues to complain of shortness of breath and little progress to improvement today.    Objective: Vitals:   02/15/21 0749 02/15/21 0758 02/15/21 1414 02/15/21 1417  BP:   (!) 93/34   Pulse:   75   Resp:   17   Temp:   98.2 F (36.8 C)   TempSrc:   Oral   SpO2: 96% 96% 96% 95%  Weight:      Height:  Intake/Output Summary (Last 24 hours) at 02/15/2021 1505 Last data filed at 02/15/2021 1414 Gross per 24 hour  Intake 600 ml  Output 850 ml  Net -250 ml   Filed Weights   02/11/21 1038  Weight: 73.9 kg    Examination:  General exam: Sitting up on the side of the bed, awake, alert appears calm and comfortable  Respiratory system: Poor air movement bilaterally, moderate increased work of breathing.  Diminshed BS LLL unchanged.  Cardiovascular system: normal S1 & S2 heard. No JVD, murmurs, rubs, gallops or clicks. No pedal edema. Gastrointestinal system: Abdomen is nondistended, soft and nontender. No organomegaly or masses felt. Normal bowel sounds heard. Central nervous system: Alert and oriented. No focal neurological deficits. Extremities: Symmetric 5 x 5 power. Skin: No rashes, lesions or ulcers Psychiatry: Judgement and insight appear normal. Mood & affect appropriate.   Data Reviewed: I have personally reviewed following labs and imaging studies  CBC: Recent Labs  Lab 02/11/21 1208 02/12/21 0503 02/13/21 0457 02/14/21 0508 02/15/21 0438  WBC 10.9* 12.0* 12.7* 14.2* 12.2*  NEUTROABS 7.8*  --   --   --   --   HGB 7.8* 7.0* 7.0* 8.4* 7.6*  HCT 26.6* 23.8* 23.8* 26.5* 24.8*  MCV 102.7* 102.1* 99.6 96.7 97.3  PLT 354 338 345 368 [id number]    Basic Metabolic Panel: Recent Labs  Lab 02/11/21 1208 02/12/21 0503 02/13/21 0457 02/13/21 1031 02/14/21 0508  NA 138 139 139  --  138  K 5.1 5.1 5.2* 4.8 4.0  CL 105 105 106  --  101  CO2 28 27 27   --  28  GLUCOSE 93 106* 115*  --  95  BUN 32* 33* 34*  --  26*  CREATININE 1.46* 1.51* 1.32*  --  1.43*  CALCIUM 8.2* 8.3* 8.6*  --  8.4*  MG  --   --  2.4  --  2.2    GFR: Estimated Creatinine Clearance: 42.4 mL/min (A) (by C-G formula based on SCr of 1.43 mg/dL (H)).  Liver Function Tests: Recent Labs  Lab 02/11/21 1208 02/13/21 0457 02/14/21 0508  AST 17 29 23   ALT 8 8 9   ALKPHOS 57 56 57  BILITOT 0.4 0.5 0.7  PROT 6.3* 5.9* 6.1*  ALBUMIN 2.9* 2.8* 2.8*    CBG: No results for input(s): GLUCAP in the last 168 hours.  Recent Results (from the past 240 hour(s))  Blood Culture (routine x 2)     Status: None (Preliminary result)   Collection Time: 02/11/21 12:15 PM   Specimen: Left Antecubital; Blood  Result Value Ref Range Status   Specimen Description LEFT ANTECUBITAL  Final   Special Requests   Final    BOTTLES  DRAWN AEROBIC AND ANAEROBIC Blood Culture results may not be optimal due to an excessive volume of blood received in culture bottles   Culture   Final    NO GROWTH 4 DAYS Performed at [facility], [address], [place], [place] [address]    Report Status PENDING  Incomplete  Blood Culture (routine x 2)     Status: None (Preliminary result)   Collection Time: 02/11/21 12:19 PM   Specimen: Right Antecubital; Blood  Result Value Ref Range Status   Specimen Description RIGHT ANTECUBITAL  Final   Special Requests   Final    BOTTLES DRAWN AEROBIC AND ANAEROBIC Blood Culture adequate volume   Culture   Final    NO GROWTH 4 DAYS Performed at [facility],  [address] [place], [place] [address]    Report Status PENDING  Incomplete  Resp Panel by RT-PCR (Flu A&B, Covid) Nasopharyngeal Swab     Status: None   Collection Time: 02/11/21 12:25 PM   Specimen: Nasopharyngeal Swab; Nasopharyngeal(NP) swabs in vial transport medium  Result Value Ref Range Status   SARS Coronavirus 2 by RT PCR NEGATIVE NEGATIVE Final    Comment: (NOTE) SARS-CoV-2 target nucleic acids are NOT DETECTED.  The SARS-CoV-2 RNA is generally detectable in upper respiratory specimens during the acute phase of infection. The lowest concentration of SARS-CoV-2 viral copies this assay can detect is 138 copies/mL. A negative result does not preclude SARS-Cov-2 infection and should not be used as the sole basis for treatment or other patient management decisions. A negative result may occur with  improper specimen collection/handling, submission of specimen other than nasopharyngeal swab, presence of viral mutation(s) within the areas targeted by this assay, and inadequate number of viral copies(<138 copies/mL). A negative result must be combined with clinical observations, patient history, and epidemiological information. The expected result is Negative.  Fact Sheet for Patients:   EntrepreneurPulse.com.au  Fact Sheet for Healthcare Providers:  IncredibleEmployment.be  This test is no t yet approved or cleared by the [place] FDA and  has been authorized for detection and/or diagnosis of SARS-CoV-2 by FDA under an Emergency Use Authorization (EUA). This EUA will remain  in effect (meaning this test can be used) for the duration of the COVID-19 declaration under Section 564(b)(1) of the Act, 21 U.S.C.section 360bbb-3(b)(1), unless the authorization is terminated  or revoked sooner.       Influenza A by PCR NEGATIVE NEGATIVE Final   Influenza B by PCR NEGATIVE NEGATIVE Final    Comment: (NOTE) The Xpert Xpress SARS-CoV-2/FLU/RSV plus assay is intended as an aid in the diagnosis of influenza from Nasopharyngeal swab specimens and should not be used as a sole basis for treatment. Nasal washings and aspirates are unacceptable for Xpert Xpress SARS-CoV-2/FLU/RSV testing.  Fact Sheet for Patients: EntrepreneurPulse.com.au  Fact Sheet for Healthcare Providers: IncredibleEmployment.be  This test is not yet approved or cleared by the [place] FDA and has been authorized for detection and/or diagnosis of SARS-CoV-2 by FDA under an Emergency Use Authorization (EUA). This EUA will remain in effect (meaning this test can be used) for the duration of the COVID-19 declaration under Section 564(b)(1) of the Act, 21 U.S.C. section 360bbb-3(b)(1), unless the authorization is terminated or revoked.  Performed at [facility], [address], [place], [place] [address]   Gram stain     Status: None   Collection Time: 02/13/21 11:35 AM   Specimen: Pleura  Result Value Ref Range Status   Specimen Description PLEURAL  Final   Special Requests NONE  Final   Gram Stain   Final    NO ORGANISMS SEEN WBC PRESENT,BOTH PMN AND MONONUCLEAR Gram Stain Report Called to,Read Back By and Verified With:  A CRADDOCK RN [phone] Marcos Eke Performed at [facility], [address], [place],  [address]    Report Status 02/13/2021 FINAL  Final  Culture, body fluid w Gram Stain-bottle     Status: None (Preliminary result)   Collection Time: 02/13/21 11:35 AM   Specimen: Pleura  Result Value Ref Range Status   Specimen Description PLEURAL  Final   Special Requests   Final    BOTTLES DRAWN AEROBIC AND ANAEROBIC Blood Culture adequate volume   Culture   Final  NO GROWTH 2 DAYS Performed at [facility], [address], [place], [place] [address]    Report Status PENDING  Incomplete     Radiology Studies: No results found.  Scheduled Meds:  budesonide  0.5 mg Nebulization BID   diltiazem  180 mg Oral Daily   dronedarone  400 mg Oral BID WC   folic acid  1 mg Oral Daily   guaiFENesin  1,200 mg Oral BID   ipratropium-albuterol  3 mL Nebulization TID   melatonin  6 mg Oral QHS   pantoprazole  40 mg Oral BID   rosuvastatin  5 mg Oral Daily   vitamin B-12  1,000 mcg Oral Daily   Continuous Infusions:  cefTRIAXone (ROCEPHIN)  IV 2 g (02/15/21 1121)    LOS: 4 days   Time spent: 35 mins  Veralyn Lopp Laural Benes, MD How to contact the [facility] Attending or Consulting provider 7A - 7P or covering provider during after hours 7P -7A, for this patient?  Check the care team in [facility] and look for a) attending/consulting [facility] provider listed and b) the [facility] team listed Log into [email or web address] and use [place]'s universal password to access. If you do not have the password, please contact the hospital operator. Locate the [facility] provider you are looking for under [facility] and page to a number that you can be directly reached. If you still have difficulty reaching the provider, please page the [facility] (Director on Call) for the Hospitalists listed on amion for assistance.  02/15/2021, 3:05 PM

## 2021-02-15 NOTE — Plan of Care (Signed)
  Problem: Activity: Goal: Risk for activity intolerance will decrease Outcome: Progressing   Problem: Coping: Goal: Level of anxiety will decrease Outcome: Progressing   

## 2021-02-16 ENCOUNTER — Inpatient Hospital Stay (HOSPITAL_COMMUNITY): Payer: PPO

## 2021-02-16 DIAGNOSIS — Z7901 Long term (current) use of anticoagulants: Secondary | ICD-10-CM | POA: Diagnosis not present

## 2021-02-16 DIAGNOSIS — E538 Deficiency of other specified B group vitamins: Secondary | ICD-10-CM | POA: Diagnosis not present

## 2021-02-16 DIAGNOSIS — J9601 Acute respiratory failure with hypoxia: Secondary | ICD-10-CM | POA: Diagnosis not present

## 2021-02-16 DIAGNOSIS — J441 Chronic obstructive pulmonary disease with (acute) exacerbation: Secondary | ICD-10-CM | POA: Diagnosis not present

## 2021-02-16 LAB — CBC WITH DIFFERENTIAL/PLATELET
Abs Immature Granulocytes: 0.05 10*3/uL (ref 0.00–0.07)
Basophils Absolute: 0.1 10*3/uL (ref 0.0–0.1)
Basophils Relative: 1 %
Eosinophils Absolute: 0.8 10*3/uL — ABNORMAL HIGH (ref 0.0–0.5)
Eosinophils Relative: 6 %
HCT: 26.7 % — ABNORMAL LOW (ref 39.0–52.0)
Hemoglobin: 8.2 g/dL — ABNORMAL LOW (ref 13.0–17.0)
Immature Granulocytes: 0 %
Lymphocytes Relative: 9 %
Lymphs Abs: 1 10*3/uL (ref 0.7–4.0)
MCH: 30.5 pg (ref 26.0–34.0)
MCHC: 30.7 g/dL (ref 30.0–36.0)
MCV: 99.3 fL (ref 80.0–100.0)
Monocytes Absolute: 1.8 10*3/uL — ABNORMAL HIGH (ref 0.1–1.0)
Monocytes Relative: 15 %
Neutro Abs: 8.4 10*3/uL — ABNORMAL HIGH (ref 1.7–7.7)
Neutrophils Relative %: 69 %
Platelets: 377 10*3/uL (ref 150–400)
RBC: 2.69 MIL/uL — ABNORMAL LOW (ref 4.22–5.81)
RDW: 14.6 % (ref 11.5–15.5)
WBC: 12.2 10*3/uL — ABNORMAL HIGH (ref 4.0–10.5)
nRBC: 0 % (ref 0.0–0.2)

## 2021-02-16 MED ORDER — METHYLPREDNISOLONE SODIUM SUCC 40 MG IJ SOLR
40.0000 mg | Freq: Two times a day (BID) | INTRAMUSCULAR | Status: DC
  Administered 2021-02-16 – 2021-02-17 (×3): 40 mg via INTRAVENOUS
  Filled 2021-02-16 (×3): qty 1

## 2021-02-16 MED ORDER — SODIUM CHLORIDE 0.9 % IV BOLUS
500.0000 mL | Freq: Once | INTRAVENOUS | Status: AC
  Administered 2021-02-16: 22:00:00 500 mL via INTRAVENOUS

## 2021-02-16 MED ORDER — PREDNISONE 20 MG PO TABS: 40.0000 mg | ORAL_TABLET | Freq: Every day | ORAL | Status: DC

## 2021-02-16 MED ORDER — HYDROCORTISONE 1 % EX CREA
TOPICAL_CREAM | Freq: Three times a day (TID) | CUTANEOUS | Status: DC | PRN
  Filled 2021-02-16: qty 28

## 2021-02-16 NOTE — Progress Notes (Signed)
PROGRESS NOTE   Blake Cherry  [id number] DOB: Aug 14, 1945 DOA: 02/11/2021 PCP: Kathyrn Drown, MD   Chief Complaint  Patient presents with   Shortness of Breath   Level of care: Med-Surg  Brief Admission History:  75 y.o. male with medical history significant for COPD, atrial flutter, CKD 3, hypertension, stroke.  presented to the ED with complaints of difficulty breathing of 2 to 3 days.  Reports difficulty breathing at rest, that worsens with exertion.  Reports he is unable to lie back to sleep due to difficulty breathing.,  And sleeps sitting up.  He also reports a cough that is dry, and nasal congestion.  He has chronic cough that has not significantly changed from his baseline.  He denies chest pain.  No lower extremity swelling.  He is on Eliquis and reports compliance.   He denies black stools, no blood in stools.  No vomiting.  No abdominal pain.  He takes 400 mg of ibuprofen very occasionally, on the average 2-3 times a month.  He was admitted with acute respiratory failure with hypoxia and acute blood loss anemia.    Assessment & Plan:   Principal Problem:   Acute respiratory failure with hypoxia (HCC) Active Problems:   B12 deficiency   Essential hypertension, benign   COPD severe GOLD III/ active smoker   COPD with acute exacerbation (HCC)   CRI (chronic renal insufficiency), stage 3 (moderate) (HCC)   Atrial flutter (HCC)   Anticoagulated   Acute anemia  Acute respiratory failure with hypoxia  -Multifactorial given pneumonia findings and acute anemia -Continue supplemental oxygen -CTA with findings of pneumonia but no PE  COPD exacerbation -Continue treating with scheduled IV steroids and bronchodilators and mucolytic's -Continue antibiotics as ordered  Moderate right pleural effusion  - s/p US thoracentesis 12/22, fluid sent for testing, cytology, gram stain, etc.  - results c/w reactive mesothelial cells   Loculated left pleural effusion  - outpatient  pulmonology follow up  - If dyspnea doesn't improve will ask for pulmonary consultation  - pulmonary consult not available until 12/26 at this hospital  Acute anemia -Noted to have low 123456 and folic acid levels -Follow FOBT, still waiting on BM, peri-colace ordered 12/24 -GI consultation appreciated -Temporarily holding apixaban per GI recs -Supplementing 123456 and folic acid -Transfused 1 unit PRBC 12/22  -Unfortunately Hg trended back down 12/24 from 8.4  -Rechecked CBC 8.2 on 12/25. Transfuse for Hg<7.   123456 and folic acid deficiency -Supplementation orders written to start 02/12/2021  Chronic atrial fibrillation -Temporarily holding apixaban in setting of acute anemia per GI team recommendations -Cardizem ordered for heart rate control  Stage IIIb CKD -Stable and following closely  DVT prophylaxis: SCDs Code Status: full  Family Communication: spouse  Disposition: anticipate DC home  Status is: Inpatient  Remains inpatient appropriate because: IV antibiotics required, GI bleed evaluation, transfuse PRBC, may need inpatient endoscopy as Hg continues to trend down     Consultants:  GI   Procedures:  Thoracentesis 02/13/21  Antimicrobials:  azithromycin 12/20>>12/24 Ceftriaxone 12/20>>12/26  Subjective:  Pt reports productive cough and chest congestion, no fever or chills.  Pain with deep breaths, no chest pain, SOB persistent.     Objective: Vitals:   02/15/21 2224 02/16/21 0426 02/16/21 0739 02/16/21 1000  BP: 98/75 (!) 95/57  (!) 92/44  Pulse: 80 79  85  Resp: 18 18  18   Temp: 98.5 F (36.9 C) 97.7 F (36.5 C)  97.7 F (36.5 C)  TempSrc:    Oral  SpO2: 93% 98% 93% 95%  Weight:      Height:        Intake/Output Summary (Last 24 hours) at 02/16/2021 1055 Last data filed at 02/16/2021 0500 Gross per 24 hour  Intake 840 ml  Output 700 ml  Net 140 ml   Filed Weights   02/11/21 1038  Weight: 73.9 kg   Examination:  General exam: awake, alert  appears calm and comfortable  Respiratory system: Poor air movement bilaterally, moderate increased work of breathing. Diminshed BS LLL unchanged.  Cardiovascular system: normal S1 & S2 heard. No JVD, murmurs, rubs, gallops or clicks. No pedal edema. Gastrointestinal system: Abdomen is nondistended, soft and nontender. No organomegaly or masses felt. Normal bowel sounds heard. Central nervous system: Alert and oriented. No focal neurological deficits. Extremities: Symmetric 5 x 5 power. Skin: No rashes, lesions or ulcers Psychiatry: Judgement and insight appear normal. Mood & affect appropriate.   Data Reviewed: I have personally reviewed following labs and imaging studies  CBC: Recent Labs  Lab 02/11/21 1208 02/12/21 0503 02/13/21 0457 02/14/21 0508 02/15/21 0438 02/16/21 0420  WBC 10.9* 12.0* 12.7* 14.2* 12.2* 12.2*  NEUTROABS 7.8*  --   --   --   --  8.4*  HGB 7.8* 7.0* 7.0* 8.4* 7.6* 8.2*  HCT 26.6* 23.8* 23.8* 26.5* 24.8* 26.7*  MCV 102.7* 102.1* 99.6 96.7 97.3 99.3  PLT 354 338 345 368 328 377    Basic Metabolic Panel: Recent Labs  Lab 02/11/21 1208 02/12/21 0503 02/13/21 0457 02/13/21 1031 02/14/21 0508  NA 138 139 139  --  138  K 5.1 5.1 5.2* 4.8 4.0  CL 105 105 106  --  101  CO2 28 27 27   --  28  GLUCOSE 93 106* 115*  --  95  BUN 32* 33* 34*  --  26*  CREATININE 1.46* 1.51* 1.32*  --  1.43*  CALCIUM 8.2* 8.3* 8.6*  --  8.4*  MG  --   --  2.4  --  2.2    GFR: Estimated Creatinine Clearance: 42.4 mL/min (A) (by C-G formula based on SCr of 1.43 mg/dL (H)).  Liver Function Tests: Recent Labs  Lab 02/11/21 1208 02/13/21 0457 02/14/21 0508  AST 17 29 23   ALT 8 8 9   ALKPHOS 57 56 57  BILITOT 0.4 0.5 0.7  PROT 6.3* 5.9* 6.1*  ALBUMIN 2.9* 2.8* 2.8*    CBG: No results for input(s): GLUCAP in the last 168 hours.  Recent Results (from the past 240 hour(s))  Blood Culture (routine x 2)     Status: None (Preliminary result)   Collection Time: 02/11/21  12:15 PM   Specimen: Left Antecubital; Blood  Result Value Ref Range Status   Specimen Description LEFT ANTECUBITAL  Final   Special Requests   Final    BOTTLES DRAWN AEROBIC AND ANAEROBIC Blood Culture results may not be optimal due to an excessive volume of blood received in culture bottles   Culture   Final    NO GROWTH 4 DAYS Performed at [facility], [address], [place], [facility] [address]    Report Status PENDING  Incomplete  Blood Culture (routine x 2)     Status: None (Preliminary result)   Collection Time: 02/11/21 12:19 PM   Specimen: Right Antecubital; Blood  Result Value Ref Range Status   Specimen Description RIGHT ANTECUBITAL  Final   Special Requests   Final    BOTTLES DRAWN  AEROBIC AND ANAEROBIC Blood Culture adequate volume   Culture   Final    NO GROWTH 4 DAYS Performed at [facility], [address] [place], [place] [address]    Report Status PENDING  Incomplete  Resp Panel by RT-PCR (Flu A&B, Covid) Nasopharyngeal Swab     Status: None   Collection Time: 02/11/21 12:25 PM   Specimen: Nasopharyngeal Swab; Nasopharyngeal(NP) swabs in vial transport medium  Result Value Ref Range Status   SARS Coronavirus 2 by RT PCR NEGATIVE NEGATIVE Final    Comment: (NOTE) SARS-CoV-2 target nucleic acids are NOT DETECTED.  The SARS-CoV-2 RNA is generally detectable in upper respiratory specimens during the acute phase of infection. The lowest concentration of SARS-CoV-2 viral copies this assay can detect is 138 copies/mL. A negative result does not preclude SARS-Cov-2 infection and should not be used as the sole basis for treatment or other patient management decisions. A negative result may occur with  improper specimen collection/handling, submission of specimen other than nasopharyngeal swab, presence of viral mutation(s) within the areas targeted by this assay, and inadequate number of viral copies(<138 copies/mL). A negative result must be combined with clinical  observations, patient history, and epidemiological information. The expected result is Negative.  Fact Sheet for Patients:  EntrepreneurPulse.com.au  Fact Sheet for Healthcare Providers:  IncredibleEmployment.be  This test is no t yet approved or cleared by the [place] FDA and  has been authorized for detection and/or diagnosis of SARS-CoV-2 by FDA under an Emergency Use Authorization (EUA). This EUA will remain  in effect (meaning this test can be used) for the duration of the COVID-19 declaration under Section 564(b)(1) of the Act, 21 U.S.C.section 360bbb-3(b)(1), unless the authorization is terminated  or revoked sooner.       Influenza A by PCR NEGATIVE NEGATIVE Final   Influenza B by PCR NEGATIVE NEGATIVE Final    Comment: (NOTE) The Xpert Xpress SARS-CoV-2/FLU/RSV plus assay is intended as an aid in the diagnosis of influenza from Nasopharyngeal swab specimens and should not be used as a sole basis for treatment. Nasal washings and aspirates are unacceptable for Xpert Xpress SARS-CoV-2/FLU/RSV testing.  Fact Sheet for Patients: EntrepreneurPulse.com.au  Fact Sheet for Healthcare Providers: IncredibleEmployment.be  This test is not yet approved or cleared by the [place] FDA and has been authorized for detection and/or diagnosis of SARS-CoV-2 by FDA under an Emergency Use Authorization (EUA). This EUA will remain in effect (meaning this test can be used) for the duration of the COVID-19 declaration under Section 564(b)(1) of the Act, 21 U.S.C. section 360bbb-3(b)(1), unless the authorization is terminated or revoked.  Performed at [facility], [address], [place], [place] [address]   Gram stain     Status: None   Collection Time: 02/13/21 11:35 AM   Specimen: Pleura  Result Value Ref Range Status   Specimen Description PLEURAL  Final   Special Requests NONE  Final   Gram Stain    Final    NO ORGANISMS SEEN WBC PRESENT,BOTH PMN AND MONONUCLEAR Gram Stain Report Called to,Read Back By and Verified With: A CRADDOCK RN [phone] Marcos Eke Performed at [facility], [address] [place], [place] [address]    Report Status 02/13/2021 FINAL  Final  Culture, body fluid w Gram Stain-bottle     Status: None (Preliminary result)   Collection Time: 02/13/21 11:35 AM   Specimen: Pleura  Result Value Ref Range Status   Specimen Description PLEURAL  Final   Special  Requests   Final    BOTTLES DRAWN AEROBIC AND ANAEROBIC Blood Culture adequate volume   Culture   Final    NO GROWTH 2 DAYS Performed at [facility], [address] [place], [place] [address]    Report Status PENDING  Incomplete     Radiology Studies: DG CHEST PORT 1 VIEW  Result Date: 02/16/2021 CLINICAL DATA:  Acute on chronic respiratory failure EXAM: PORTABLE CHEST 1 VIEW COMPARISON:  02/13/2021 FINDINGS: Patchy bilateral airspace disease again noted, unchanged. Loculated left pleural effusion, stable. Heart is normal size. Aortic atherosclerosis. No acute bony abnormality. IMPRESSION: Stable patchy bilateral airspace disease and loculated left pleural effusion Electronically Signed   By: Rolm Baptise M.D.   On: 02/16/2021 05:14    Scheduled Meds:  budesonide  0.5 mg Nebulization BID   diltiazem  180 mg Oral Daily   dronedarone  400 mg Oral BID WC   folic acid  1 mg Oral Daily   guaiFENesin  1,200 mg Oral BID   ipratropium-albuterol  3 mL Nebulization TID   melatonin  6 mg Oral QHS   methylPREDNISolone (SOLU-MEDROL) injection  40 mg Intravenous Q12H   Followed by   Derrill Memo ON 02/18/2021] predniSONE  40 mg Oral Q breakfast   pantoprazole  40 mg Oral BID   rosuvastatin  5 mg Oral Daily   senna-docusate  2 tablet Oral QHS   vitamin B-12  1,000 mcg Oral Daily   Continuous Infusions:  cefTRIAXone (ROCEPHIN)  IV 2 g (02/15/21 1121)    LOS: 5 days   Time spent: 35 mins  Braylan Faul Wynetta Emery, MD How  to contact the [facility] Attending or Consulting provider [place] or covering provider during after hours [place], for this patient?  Check the care team in [facility] and look for a) attending/consulting [facility] provider listed and b) the [facility] team listed Log into [email or web address] and use [place]'s universal password to access. If you do not have the password, please contact the hospital operator. Locate the [facility] provider you are looking for under [facility] and page to a number that you can be directly reached. If you still have difficulty reaching the provider, please page the [facility] (Director on Call) for the Hospitalists listed on amion for assistance.  02/16/2021, 10:55 AM

## 2021-02-16 NOTE — Progress Notes (Signed)
Patient manual bp 84/46. Patient states he feels fine and showing no signs of distress. Nurse notified Dr. Morton Peters. New orders for bolus in place. Will recheck bp when bolus is complete.

## 2021-02-17 DIAGNOSIS — Z7901 Long term (current) use of anticoagulants: Secondary | ICD-10-CM | POA: Diagnosis not present

## 2021-02-17 DIAGNOSIS — J471 Bronchiectasis with (acute) exacerbation: Secondary | ICD-10-CM

## 2021-02-17 DIAGNOSIS — E538 Deficiency of other specified B group vitamins: Secondary | ICD-10-CM | POA: Diagnosis not present

## 2021-02-17 DIAGNOSIS — J9601 Acute respiratory failure with hypoxia: Secondary | ICD-10-CM | POA: Diagnosis not present

## 2021-02-17 DIAGNOSIS — J441 Chronic obstructive pulmonary disease with (acute) exacerbation: Secondary | ICD-10-CM | POA: Diagnosis not present

## 2021-02-17 LAB — CULTURE, BLOOD (ROUTINE X 2)
Culture: NO GROWTH
Culture: NO GROWTH
Special Requests: ADEQUATE

## 2021-02-17 MED ORDER — METHYLPREDNISOLONE SODIUM SUCC 40 MG IJ SOLR
40.0000 mg | Freq: Two times a day (BID) | INTRAMUSCULAR | Status: DC
  Administered 2021-02-17 – 2021-02-19 (×4): 40 mg via INTRAVENOUS
  Filled 2021-02-17 (×4): qty 1

## 2021-02-17 MED ORDER — ARFORMOTEROL TARTRATE 15 MCG/2ML IN NEBU
15.0000 ug | INHALATION_SOLUTION | Freq: Two times a day (BID) | RESPIRATORY_TRACT | Status: DC
  Administered 2021-02-17 – 2021-02-19 (×4): 15 ug via RESPIRATORY_TRACT
  Filled 2021-02-17 (×4): qty 2

## 2021-02-17 MED ORDER — SODIUM CHLORIDE 0.9 % IV BOLUS
500.0000 mL | Freq: Once | INTRAVENOUS | Status: AC
  Administered 2021-02-17: 10:00:00 500 mL via INTRAVENOUS

## 2021-02-17 MED ORDER — REVEFENACIN 175 MCG/3ML IN SOLN
175.0000 ug | Freq: Every day | RESPIRATORY_TRACT | Status: DC
  Administered 2021-02-17 – 2021-02-19 (×3): 175 ug via RESPIRATORY_TRACT
  Filled 2021-02-17 (×3): qty 3

## 2021-02-17 NOTE — Progress Notes (Signed)
PROGRESS NOTE   Blake Cherry  [id number] DOB: August 20, 1945 DOA: 02/11/2021 PCP: Kathyrn Drown, MD   Chief Complaint  Patient presents with   Shortness of Breath   Level of care: Med-Surg  Brief Admission History:  75 y.o. male with medical history significant for COPD, atrial flutter, CKD 3, hypertension, stroke.  presented to the ED with complaints of difficulty breathing of 2 to 3 days.  Reports difficulty breathing at rest, that worsens with exertion.  Reports he is unable to lie back to sleep due to difficulty breathing.,  And sleeps sitting up.  He also reports a cough that is dry, and nasal congestion.  He has chronic cough that has not significantly changed from his baseline.  He denies chest pain.  No lower extremity swelling.  He is on Eliquis and reports compliance.   He denies black stools, no blood in stools.  No vomiting.  No abdominal pain.  He takes 400 mg of ibuprofen very occasionally, on the average 2-3 times a month.  He was admitted with acute respiratory failure with hypoxia and acute blood loss anemia.    Assessment & Plan:   Principal Problem:   Acute respiratory failure with hypoxia (HCC) Active Problems:   B12 deficiency   Essential hypertension, benign   COPD severe GOLD III/ active smoker   COPD with acute exacerbation (HCC)   CRI (chronic renal insufficiency), stage 3 (moderate) (HCC)   Atrial flutter (HCC)   Anticoagulated   Acute anemia  Acute respiratory failure with hypoxia  -Multifactorial given pneumonia findings and acute anemia -Continue supplemental oxygen -CTA with findings of pneumonia but no PE -completed full course of antibiotics on 12/26.   COPD exacerbation -Continue treating with scheduled steroids and bronchodilators and mucolytics -completed antibiotics   Moderate right pleural effusion  - s/p US thoracentesis 12/22, fluid sent for testing, cytology, gram stain, etc.  - results c/w reactive mesothelial cells   Loculated  left pleural effusion  - outpatient pulmonology follow up  - If dyspnea doesn't improve will ask for pulmonary consultation  - pulmonary consult not available until 12/26 at this hospital  Acute anemia -Noted to have low 123456 and folic acid levels -Follow FOBT, still waiting on BM, peri-colace ordered 12/24 -GI consultation appreciated -Temporarily holding apixaban per GI recs -Supplementing 123456 and folic acid -Transfused 1 unit PRBC 12/22  -Unfortunately Hg trended back down 12/24 from 8.4  -Rechecked CBC 8.2 on 12/25. Transfuse for Hg<7.   123456 and folic acid deficiency -Supplementation orders written to start 02/12/2021  Chronic atrial fibrillation -Temporarily holding apixaban in setting of acute anemia per GI team recommendations -Cardizem ordered for heart rate control  Stage IIIb CKD -Stable and following closely  DVT prophylaxis: SCDs Code Status: full  Family Communication: spouse  Disposition: anticipate DC home  Status is: Inpatient  Remains inpatient appropriate because: IV antibiotics required, GI bleed evaluation, transfuse PRBC, may need inpatient endoscopy as Hg continues to trend down     Consultants:  GI   Procedures:  Thoracentesis 02/13/21  Antimicrobials:  azithromycin 12/20>>12/24 Ceftriaxone 12/20>>12/26  Subjective:  Pt reports productive cough and chest congestion, no fever or chills.  Pain with deep breaths, no chest pain, SOB persistent.     Objective: Vitals:   02/16/21 2125 02/16/21 2135 02/16/21 2138 02/17/21 0622  BP: (!) 86/45 (!) 103/48 (!) 84/46 (!) 100/57  Pulse: 84 76  85  Resp: 18   18  Temp: 98 F (36.7 C)  97.8 F (36.6 C)  TempSrc:      SpO2: 94% 95%  99%  Weight:      Height:        Intake/Output Summary (Last 24 hours) at 02/17/2021 0933 Last data filed at 02/17/2021 4193 Gross per 24 hour  Intake 1200 ml  Output 500 ml  Net 700 ml   Filed Weights   02/11/21 1038  Weight: 73.9 kg   Examination:  General  exam: awake, alert appears calm and comfortable, sitting up in bed. Speaking full sentence today. Respiratory system: Poor air movement bilaterally, moderate increased work of breathing. Diminshed BS LLL unchanged.  Cardiovascular system: normal S1 & S2 heard. No JVD, murmurs, rubs, gallops or clicks. No pedal edema. Gastrointestinal system: Abdomen is nondistended, soft and nontender. No organomegaly or masses felt. Normal bowel sounds heard. Central nervous system: Alert and oriented. No focal neurological deficits. Extremities: Symmetric 5 x 5 power. Skin: No rashes, lesions or ulcers Psychiatry: Judgement and insight appear normal. Mood & affect appropriate.   Data Reviewed: I have personally reviewed following labs and imaging studies  CBC: Recent Labs  Lab 02/11/21 1208 02/12/21 0503 02/13/21 0457 02/14/21 0508 02/15/21 0438 02/16/21 0420  WBC 10.9* 12.0* 12.7* 14.2* 12.2* 12.2*  NEUTROABS 7.8*  --   --   --   --  8.4*  HGB 7.8* 7.0* 7.0* 8.4* 7.6* 8.2*  HCT 26.6* 23.8* 23.8* 26.5* 24.8* 26.7*  MCV 102.7* 102.1* 99.6 96.7 97.3 99.3  PLT 354 338 345 368 328 377    Basic Metabolic Panel: Recent Labs  Lab 02/11/21 1208 02/12/21 0503 02/13/21 0457 02/13/21 1031 02/14/21 0508  NA 138 139 139  --  138  K 5.1 5.1 5.2* 4.8 4.0  CL 105 105 106  --  101  CO2 28 27 27   --  28  GLUCOSE 93 106* 115*  --  95  BUN 32* 33* 34*  --  26*  CREATININE 1.46* 1.51* 1.32*  --  1.43*  CALCIUM 8.2* 8.3* 8.6*  --  8.4*  MG  --   --  2.4  --  2.2    GFR: Estimated Creatinine Clearance: 42.4 mL/min (A) (by C-G formula based on SCr of 1.43 mg/dL (H)).  Liver Function Tests: Recent Labs  Lab 02/11/21 1208 02/13/21 0457 02/14/21 0508  AST 17 29 23   ALT 8 8 9   ALKPHOS 57 56 57  BILITOT 0.4 0.5 0.7  PROT 6.3* 5.9* 6.1*  ALBUMIN 2.9* 2.8* 2.8*    CBG: No results for input(s): GLUCAP in the last 168 hours.  Recent Results (from the past 240 hour(s))  Blood Culture (routine x 2)      Status: None   Collection Time: 02/11/21 12:15 PM   Specimen: Left Antecubital; Blood  Result Value Ref Range Status   Specimen Description LEFT ANTECUBITAL  Final   Special Requests   Final    BOTTLES DRAWN AEROBIC AND ANAEROBIC Blood Culture results may not be optimal due to an excessive volume of blood received in culture bottles   Culture   Final    NO GROWTH 6 DAYS Performed at [facility], [address], [place], [facility] [address]    Report Status 02/17/2021 FINAL  Final  Blood Culture (routine x 2)     Status: None   Collection Time: 02/11/21 12:19 PM   Specimen: Right Antecubital; Blood  Result Value Ref Range Status   Specimen Description RIGHT ANTECUBITAL  Final   Special  Requests   Final    BOTTLES DRAWN AEROBIC AND ANAEROBIC Blood Culture adequate volume   Culture   Final    NO GROWTH 6 DAYS Performed at [facility], [address] [place], [place] [address]    Report Status 02/17/2021 FINAL  Final  Resp Panel by RT-PCR (Flu A&B, Covid) Nasopharyngeal Swab     Status: None   Collection Time: 02/11/21 12:25 PM   Specimen: Nasopharyngeal Swab; Nasopharyngeal(NP) swabs in vial transport medium  Result Value Ref Range Status   SARS Coronavirus 2 by RT PCR NEGATIVE NEGATIVE Final    Comment: (NOTE) SARS-CoV-2 target nucleic acids are NOT DETECTED.  The SARS-CoV-2 RNA is generally detectable in upper respiratory specimens during the acute phase of infection. The lowest concentration of SARS-CoV-2 viral copies this assay can detect is 138 copies/mL. A negative result does not preclude SARS-Cov-2 infection and should not be used as the sole basis for treatment or other patient management decisions. A negative result may occur with  improper specimen collection/handling, submission of specimen other than nasopharyngeal swab, presence of viral mutation(s) within the areas targeted by this assay, and inadequate number of viral copies(<138 copies/mL). A negative result  must be combined with clinical observations, patient history, and epidemiological information. The expected result is Negative.  Fact Sheet for Patients:  EntrepreneurPulse.com.au  Fact Sheet for Healthcare Providers:  IncredibleEmployment.be  This test is no t yet approved or cleared by the [place] FDA and  has been authorized for detection and/or diagnosis of SARS-CoV-2 by FDA under an Emergency Use Authorization (EUA). This EUA will remain  in effect (meaning this test can be used) for the duration of the COVID-19 declaration under Section 564(b)(1) of the Act, 21 U.S.C.section 360bbb-3(b)(1), unless the authorization is terminated  or revoked sooner.       Influenza A by PCR NEGATIVE NEGATIVE Final   Influenza B by PCR NEGATIVE NEGATIVE Final    Comment: (NOTE) The Xpert Xpress SARS-CoV-2/FLU/RSV plus assay is intended as an aid in the diagnosis of influenza from Nasopharyngeal swab specimens and should not be used as a sole basis for treatment. Nasal washings and aspirates are unacceptable for Xpert Xpress SARS-CoV-2/FLU/RSV testing.  Fact Sheet for Patients: EntrepreneurPulse.com.au  Fact Sheet for Healthcare Providers: IncredibleEmployment.be  This test is not yet approved or cleared by the [place] FDA and has been authorized for detection and/or diagnosis of SARS-CoV-2 by FDA under an Emergency Use Authorization (EUA). This EUA will remain in effect (meaning this test can be used) for the duration of the COVID-19 declaration under Section 564(b)(1) of the Act, 21 U.S.C. section 360bbb-3(b)(1), unless the authorization is terminated or revoked.  Performed at [facility], [address], [place], [place] [address]   Gram stain     Status: None   Collection Time: 02/13/21 11:35 AM   Specimen: Pleura  Result Value Ref Range Status   Specimen Description PLEURAL  Final   Special  Requests NONE  Final   Gram Stain   Final    NO ORGANISMS SEEN WBC PRESENT,BOTH PMN AND MONONUCLEAR Gram Stain Report Called to,Read Back By and Verified With: A CRADDOCK RN [phone] Marcos Eke Performed at [facility], [address], [place], [place] [address]    Report Status 02/13/2021 FINAL  Final  Culture, body fluid w Gram Stain-bottle     Status: None (Preliminary result)   Collection Time: 02/13/21 11:35 AM   Specimen: Pleura  Result Value Ref Range Status  Specimen Description PLEURAL  Final   Special Requests   Final    BOTTLES DRAWN AEROBIC AND ANAEROBIC Blood Culture adequate volume   Culture   Final    NO GROWTH 4 DAYS Performed at [facility], [address] [place], [place] [address]    Report Status PENDING  Incomplete     Radiology Studies: DG CHEST PORT 1 VIEW  Result Date: 02/16/2021 CLINICAL DATA:  Acute on chronic respiratory failure EXAM: PORTABLE CHEST 1 VIEW COMPARISON:  02/13/2021 FINDINGS: Patchy bilateral airspace disease again noted, unchanged. Loculated left pleural effusion, stable. Heart is normal size. Aortic atherosclerosis. No acute bony abnormality. IMPRESSION: Stable patchy bilateral airspace disease and loculated left pleural effusion Electronically Signed   By: Rolm Baptise M.D.   On: 02/16/2021 05:14    Scheduled Meds:  budesonide  0.5 mg Nebulization BID   diltiazem  180 mg Oral Daily   dronedarone  400 mg Oral BID WC   folic acid  1 mg Oral Daily   guaiFENesin  1,200 mg Oral BID   ipratropium-albuterol  3 mL Nebulization TID   melatonin  6 mg Oral QHS   methylPREDNISolone (SOLU-MEDROL) injection  40 mg Intravenous Q12H   Followed by   Derrill Memo ON 02/18/2021] predniSONE  40 mg Oral Q breakfast   pantoprazole  40 mg Oral BID   rosuvastatin  5 mg Oral Daily   senna-docusate  2 tablet Oral QHS   vitamin B-12  1,000 mcg Oral Daily   Continuous Infusions:   LOS: 6 days   Time spent: 35 mins  Siobahn Worsley Wynetta Emery, MD How to contact  the [facility] Attending or Consulting provider [place] or covering provider during after hours [place], for this patient?  Check the care team in [facility] and look for a) attending/consulting [facility] provider listed and b) the [facility] team listed Log into [email or web address] and use [place]'s universal password to access. If you do not have the password, please contact the hospital operator. Locate the [facility] provider you are looking for under [facility] and page to a number that you can be directly reached. If you still have difficulty reaching the provider, please page the [facility] (Director on Call) for the Hospitalists listed on amion for assistance.  02/17/2021, 9:33 AM

## 2021-02-17 NOTE — Consult Note (Signed)
NAME:  Blake Cherry, MRN:  [id number], DOB:  07-Sep-1945, LOS: 6 ADMISSION DATE:  02/11/2021, CONSULTATION DATE:  02/17/2021 REFERRING MD:  Dr. Wynetta Emery, [facility], CHIEF COMPLAINT:  Short of breath   History of Present Illness:  75 yo male smoker presented to ED on 12/20 with several worsening days of dyspnea associated with productive cough and nasal congestion.  SpO2 in 80's on room air.  COVID/Influenza PCR negative.  Found to have partially loculated Lt pleural effusion.  This was first noted on imaging studies from 03/06/20.  He was started on therapy for COPD exacerbation.  He was also found to have anemia requiring PRBC transfusion.  He had thoracentesis by IR for new Rt pleural effusion.  He continued to have symptoms of dyspnea and PCCM consulted to assist with management.  He is followed by Dr. Melvyn Novas in pulmonary office for COPD, bronchiectasis, and lung nodules.  He reports exposure to asbestos as a child and young adult while working in [place].  Pertinent  Medical History  AAA, A fib/flutter on eliquis, CKD 3b, Diverticulitis, HTN, CVA  Significant Hospital Events:  12/20 Admit 12/21 GI consulted for iron deficiency anemia 12/22 Transfuse 1 unit PRBC, IR Rt thoracentesis 12/25 BP 84/45 >> improved with IV fluid bolus  Studies:  Spirometry 05/07/15 >> FEV1 1.06 (34%), FEV1% 47 PET scan 07/29/20 >> rounded ATX in Lingula/LUL, emphysema, airway thickening with mucus plugging, Rt apical scarring Rt thoracentesis 02/13/21 >> 600 ml yellow fluid, glucose 121, protein < 3, cells 783 (47% neutrophils, 28% lymphocytes, 20% macrophages, 5% eosinophils), reactive mesothelial cells on cytology Echo 04/02/20 >> EF 60 to 65%, mod LA dilation CT angio chest 01/28/21 >> upper lobe predominate emphysema, mild bronchial wall thickening, stable Lt loculated effusion, cylindrical BTX at bases CT angio chest 02/11/21 >> increased airway thickening and impaction  Interim History / Subjective:   He reports having trouble affording his medications.  He was prescribed breztri as an outpt, but had been rationing previous scripts for symbicort.  He also started using his wife's supplemental oxygen at home prior to admission.  He complains of having swelling and soreness intermittently in his metacarpal joints.  He doesn't feel like his breathing is bouncing back as quickly during this exacerbation.  He continues to have wheezing, cough with green sputum, and doesn't feel like he can get enough air in.  Objective   Blood pressure (!) 123/59, pulse 100, temperature 98 F (36.7 C), temperature source Oral, resp. rate 18, height 5\' 7"  (1.702 m), weight 73.9 kg, SpO2 97 %.        Intake/Output Summary (Last 24 hours) at 02/17/2021 1312 Last data filed at 02/17/2021 [id number] Gross per 24 hour  Intake 720 ml  Output 500 ml  Net 220 ml   Filed Weights   02/11/21 1038  Weight: 73.9 kg    Examination:  General - alert Eyes - pupils reactive ENT - no sinus tenderness, no stridor Cardiac - regular rate/rhythm, no murmur Chest - diffuse, bilateral wheezing Abdomen - soft, non tender, + bowel sounds Extremities - no cyanosis, clubbing, or edema Skin - no rashes Neuro - normal strength, moves extremities, follows commands Psych - normal mood and behavior  Assessment & Plan:   Acute hypoxic respiratory failure 2nd to COPD exacerbation with ongoing tobacco abuse. - he has recurrent exacerbations needing steroids and antibiotics in the past 1 year - completed 6 days of ABx on 12/25 (rocephin/zithromax) - continue solumedrol IV - continue  pulmicort - start yupelri/brovana - prn albuterol - hold outpt breztri - will need to assess for home oxygen prior to discharge  Partially loculated Left pleural effusion. - present since imaging study from 03/06/20; he was treated for a respiratory infection at that time  New Rt pleural effusion. - fluid analysis from consistent with transudate -  f/u pleural fluid culture from 12/22  Lower lobe predominant cylindrical bronchiectasis. - check sputum cultures, sputum AFB to assess for MAI >> He does not need airborne isolation for this - adjust bronchial hygiene - check quantiferon gold, AND, RF  Rounded atelectasis in LUL/Lingula. - could be related to prior asbestos exposure  Iron deficiency anemia. - remains on protonix - GI consulted, and will arrange for EGD/colonoscopy when medically stable - transfuse for Hb < 7  Chronic a fib. B12/folate deficiencies. CKD 3b. - per primary team  Social determinants of health. - he has trouble affording his medications and has been rationing therapy at home  Goals of care. - full code  Updated his pt's wife at bedside  Labs    CMP Latest Ref Rng & Units 02/14/2021 02/13/2021 02/13/2021  Glucose 70 - 99 mg/dL 95 - 115(H)  BUN 8 - 23 mg/dL 26(H) - 34(H)  Creatinine 0.61 - 1.24 mg/dL 1.43(H) - 1.32(H)  Sodium 135 - 145 mmol/L 138 - 139  Potassium 3.5 - 5.1 mmol/L 4.0 4.8 5.2(H)  Chloride 98 - 111 mmol/L 101 - 106  CO2 22 - 32 mmol/L 28 - 27  Calcium 8.9 - 10.3 mg/dL 8.4(L) - 8.6(L)  Total Protein 6.5 - 8.1 g/dL 6.1(L) - 5.9(L)  Total Bilirubin 0.3 - 1.2 mg/dL 0.7 - 0.5  Alkaline Phos 38 - 126 U/L 57 - 56  AST 15 - 41 U/L 23 - 29  ALT 0 - 44 U/L 9 - 8    CBC Latest Ref Rng & Units 02/16/2021 02/15/2021 02/14/2021  WBC 4.0 - 10.5 K/uL 12.2(H) 12.2(H) 14.2(H)  Hemoglobin 13.0 - 17.0 g/dL 8.2(L) 7.6(L) 8.4(L)  Hematocrit 39.0 - 52.0 % 26.7(L) 24.8(L) 26.5(L)  Platelets 150 - 400 K/uL 377 328 368    Review of Systems:   Reviewed and negative  Past Medical History:  He,  has a past medical history of AAA (abdominal aortic aneurysm), Atrial fibrillation and flutter ([place]), Carotid artery disease ([place]) (2007), CKD (chronic kidney disease), stage III ([place]), COPD (chronic obstructive pulmonary disease) ([place]), Diverticulitis, Essential hypertension, Stroke ([place]) (2007), and SVT  (supraventricular tachycardia) ([place]).   Surgical History:   Past Surgical History:  Procedure Laterality Date   ABDOMINAL AORTAGRAM N/A 03/10/2013   Procedure: ABDOMINAL Maxcine Ham;  Surgeon: Elam Dutch, MD;  Location: [facility] C[facility];  Service: Cardiovascular;  Laterality: N/A;   ABDOMINAL AORTIC ENDOVASCULAR STENT GRAFT N/A 08/22/2012   Procedure: ABDOMINAL AORTIC ENDOVASCULAR STENT GRAFT;  Surgeon: Elam Dutch, MD;  Location: [facility];  Service: Vascular;  Laterality: N/A;   CAROTID ENDARTERECTOMY Left 2007   CAROTID STENT  2007   left internal carotid artery   TESTICLE TORSION REDUCTION     age of 79     Social History:   reports that he has been smoking cigarettes. He has a 30.00 pack-year smoking history. He has quit using smokeless tobacco.  His smokeless tobacco use included snuff. He reports that he does not drink alcohol and does not use drugs.   Family History:  His family history includes Cancer in his mother; Diabetes in his father and mother; Hypertension  in his mother.   Allergies No Known Allergies   Home Medications  Prior to Admission medications   Medication Sig Start Date End Date Taking? Authorizing Provider  albuterol (PROVENTIL) (2.5 MG/3ML) 0.083% nebulizer solution USE 1 VIAL IN NEBULIZER EVERY 4 HOURS AS NEEDED FOR WHEEZING OR SHORTNESS OF BREATH 02/07/20  Yes Luking, Jonna Coup, MD  apixaban (ELIQUIS) 5 MG TABS tablet Take 1 tablet (5 mg total) by mouth 2 (two) times daily. 01/30/21  Yes Jonelle Sidle, MD  budesonide (PULMICORT) 0.5 MG/2ML nebulizer solution USE ONE VIAL IN NEBULIZER TWICE DAILY 07/24/20  Yes Luking, Jonna Coup, MD  diltiazem (CARDIZEM CD) 180 MG 24 hr capsule Take 1 capsule (180 mg total) by mouth daily. 01/30/21  Yes Jonelle Sidle, MD  dronedarone (MULTAQ) 400 MG tablet Take 1 tablet (400 mg total) by mouth 2 (two) times daily with a meal. 01/30/21  Yes Jonelle Sidle, MD  guaiFENesin (MUCINEX) 600 MG 12 hr tablet Take 1 tablet (600  mg total) by mouth 2 (two) times daily. Patient taking differently: Take 600 mg by mouth as needed. 09/19/14  Yes Erick Blinks, MD  PROAIR HFA 108 (90 Base) MCG/ACT inhaler INHALE 2 PUFFS BY MOUTH EVERY 6 HOURS AS NEEDED FOR WHEEZING FOR SHORTNESS OF BREATH 07/19/20  Yes Luking, Jonna Coup, MD  rosuvastatin (CRESTOR) 5 MG tablet Take 1 tablet (5 mg total) by mouth daily. 07/24/20  Yes Luking, Jonna Coup, MD  Budeson-Glycopyrrol-Formoterol (BREZTRI AEROSPHERE) 160-9-4.8 MCG/ACT AERO Inhale 2 puffs into the lungs in the morning and at bedtime. Patient not taking: Reported on 02/11/2021 07/24/20   [name], MD  furosemide (LASIX) 20 MG tablet Take 20 mg by mouth daily as needed. Patient not taking: Reported on 02/11/2021 02/26/20   [provider]  mometasone (ELOCON) 0.1 % cream Apply BID PRN Patient not taking: Reported on 02/11/2021 09/26/19   [name], MD  predniSONE (DELTASONE) 10 MG tablet Take  4 each am x 2 days,   2 each am x 2 days,  1 each am x 2 days and stop Patient not taking: Reported on 02/11/2021 01/21/21   Ameduite, Alvino Chapel, NP     Signature:  Coralyn Helling, MD [place] [facility] Pager - [phone] 02/17/2021, 3:23 PM

## 2021-02-18 DIAGNOSIS — J441 Chronic obstructive pulmonary disease with (acute) exacerbation: Secondary | ICD-10-CM | POA: Diagnosis not present

## 2021-02-18 DIAGNOSIS — E538 Deficiency of other specified B group vitamins: Secondary | ICD-10-CM | POA: Diagnosis not present

## 2021-02-18 DIAGNOSIS — J9601 Acute respiratory failure with hypoxia: Secondary | ICD-10-CM | POA: Diagnosis not present

## 2021-02-18 DIAGNOSIS — Z7901 Long term (current) use of anticoagulants: Secondary | ICD-10-CM | POA: Diagnosis not present

## 2021-02-18 LAB — CBC WITH DIFFERENTIAL/PLATELET
Abs Immature Granulocytes: 0.19 10*3/uL — ABNORMAL HIGH (ref 0.00–0.07)
Basophils Absolute: 0 10*3/uL (ref 0.0–0.1)
Basophils Relative: 0 %
Eosinophils Absolute: 0 10*3/uL (ref 0.0–0.5)
Eosinophils Relative: 0 %
HCT: 26.7 % — ABNORMAL LOW (ref 39.0–52.0)
Hemoglobin: 7.8 g/dL — ABNORMAL LOW (ref 13.0–17.0)
Immature Granulocytes: 1 %
Lymphocytes Relative: 3 %
Lymphs Abs: 0.6 10*3/uL — ABNORMAL LOW (ref 0.7–4.0)
MCH: 28.7 pg (ref 26.0–34.0)
MCHC: 29.2 g/dL — ABNORMAL LOW (ref 30.0–36.0)
MCV: 98.2 fL (ref 80.0–100.0)
Monocytes Absolute: 0.6 10*3/uL (ref 0.1–1.0)
Monocytes Relative: 3 %
Neutro Abs: 18.2 10*3/uL — ABNORMAL HIGH (ref 1.7–7.7)
Neutrophils Relative %: 93 %
Platelets: 470 10*3/uL — ABNORMAL HIGH (ref 150–400)
RBC: 2.72 MIL/uL — ABNORMAL LOW (ref 4.22–5.81)
RDW: 14.5 % (ref 11.5–15.5)
WBC: 19.6 10*3/uL — ABNORMAL HIGH (ref 4.0–10.5)
nRBC: 0.1 % (ref 0.0–0.2)

## 2021-02-18 LAB — BASIC METABOLIC PANEL
Anion gap: 8 (ref 5–15)
BUN: 36 mg/dL — ABNORMAL HIGH (ref 8–23)
CO2: 27 mmol/L (ref 22–32)
Calcium: 8.4 mg/dL — ABNORMAL LOW (ref 8.9–10.3)
Chloride: 103 mmol/L (ref 98–111)
Creatinine, Ser: 1.53 mg/dL — ABNORMAL HIGH (ref 0.61–1.24)
GFR, Estimated: 47 mL/min — ABNORMAL LOW (ref >60)
Glucose, Bld: 146 mg/dL — ABNORMAL HIGH (ref 70–99)
Potassium: 4.8 mmol/L (ref 3.5–5.1)
Sodium: 138 mmol/L (ref 135–145)

## 2021-02-18 LAB — MAGNESIUM: Magnesium: 2.6 mg/dL — ABNORMAL HIGH (ref 1.7–2.4)

## 2021-02-18 MED ORDER — APIXABAN 5 MG PO TABS
5.0000 mg | ORAL_TABLET | Freq: Two times a day (BID) | ORAL | Status: DC
  Administered 2021-02-18 – 2021-02-19 (×2): 5 mg via ORAL
  Filled 2021-02-18 (×2): qty 1

## 2021-02-18 NOTE — Progress Notes (Signed)
Patient had a slight wheeze in upper lobes.  Patient still has a congested cough but took treatment fine.

## 2021-02-18 NOTE — Progress Notes (Signed)
PROGRESS NOTE   Blake Cherry  PJK:[id number] DOB: December 12, 1945 DOA: 02/11/2021 PCP: [name], MD   Chief Complaint  Patient presents with   Shortness of Breath   Level of care: Med-Surg  Brief Admission History:  75 y.o. male with medical history significant for COPD, atrial flutter, CKD 3, hypertension, stroke.  presented to the ED with complaints of difficulty breathing of 2 to 3 days.  Reports difficulty breathing at rest, that worsens with exertion.  Reports he is unable to lie back to sleep due to difficulty breathing.,  And sleeps sitting up.  He also reports a cough that is dry, and nasal congestion.  He has chronic cough that has not significantly changed from his baseline.  He denies chest pain.  No lower extremity swelling.  He is on Eliquis and reports compliance.   He denies black stools, no blood in stools.  No vomiting.  No abdominal pain.  He takes 400 mg of ibuprofen very occasionally, on the average 2-3 times a month.  He was admitted with acute respiratory failure with hypoxia and acute blood loss anemia.    Assessment & Plan:   Principal Problem:   Acute respiratory failure with hypoxia (HCC) Active Problems:   B12 deficiency   Essential hypertension, benign   COPD severe GOLD III/ active smoker   COPD with acute exacerbation (HCC)   CRI (chronic renal insufficiency), stage 3 (moderate) (HCC)   Atrial flutter (HCC)   Anticoagulated   Acute anemia  Acute respiratory failure with hypoxia  -Multifactorial given pneumonia findings and acute anemia -Continue supplemental oxygen -CTA with findings of pneumonia but no PE -completed full course of antibiotics on 12/26.   COPD exacerbation -Continue treating with scheduled steroids and bronchodilators and mucolytics -completed antibiotics  -pulmonary consultation appreciated.  Adjusted bronchodilators.   Moderate right pleural effusion  - s/p US thoracentesis 12/22, fluid sent for testing, cytology, gram  stain, etc.  - prelim results c/w reactive mesothelial cells   Loculated left pleural effusion  - outpatient pulmonology follow up  - If dyspnea doesn't improve will ask for pulmonary consultation  - pulmonary consultation appreciated. See consult notes and new recs.   Acute anemia -Noted to have low B12 and folic acid levels -Follow FOBT, still waiting on BM, peri-colace ordered 12/24 -GI consultation appreciated -Temporarily holding apixaban per GI recs -Supplementing B12 and folic acid -Transfused 1 unit PRBC 12/22  -Unfortunately Hg trended back down 12/24 from 8.4  -Rechecked CBC 8.2 on 12/25. Transfuse for Hg<7.  -GI considering EGD/colon when his respiratory status improves/stabilizes  B12 and folic acid deficiency -Supplementation orders started 02/12/2021  Chronic atrial fibrillation -Temporarily holding apixaban in setting of acute anemia per GI team recommendations -Cardizem ordered for heart rate control -Discuss with GI team regarding when he can restart apixaban.  -they were considering doing inpatient EGD/colon when his respiratory status stabilizes  Stage IIIb CKD -Stable and following closely  DVT prophylaxis: SCDs Code Status: full  Family Communication: spouse  Disposition: anticipate DC home  Status is: Inpatient  Remains inpatient appropriate because: IV antibiotics required, requiring inpatient critical care pulmonology consultation, GI bleed evaluation, transfused PRBC, may need inpatient endoscopy as Hg continues to trend down     Consultants:  GI   Procedures:  Thoracentesis 02/13/21  Antimicrobials:  azithromycin 12/20>>12/24 Ceftriaxone 12/20>>12/26  Subjective:  Pt reports ongoing SOB.  No Chest pain.  No fever or chills.       Objective: Vitals:  02/18/21 0720 02/18/21 0725 02/18/21 0729 02/18/21 0859  BP:    (!) 99/48  Pulse:      Resp:      Temp:      TempSrc:      SpO2: 98% 98% 98%   Weight:      Height:         Intake/Output Summary (Last 24 hours) at 02/18/2021 0916 Last data filed at 02/18/2021 0500 Gross per 24 hour  Intake 480 ml  Output --  Net 480 ml   Filed Weights   02/11/21 1038  Weight: 73.9 kg   Examination:  General exam: awake, alert appears calm and comfortable, sitting up in bed. Speaking full sentence today. Respiratory system: Poor air movement bilaterally, moderate increased work of breathing. Diminshed BS LLL unchanged.  Cardiovascular system: normal S1 & S2 heard. No JVD, murmurs, rubs, gallops or clicks. No pedal edema. Gastrointestinal system: Abdomen is nondistended, soft and nontender. No organomegaly or masses felt. Normal bowel sounds heard. Central nervous system: Alert and oriented. No focal neurological deficits. Extremities: Symmetric 5 x 5 power. Skin: No rashes, lesions or ulcers Psychiatry: Judgement and insight appear normal. Mood & affect appropriate.   Data Reviewed: I have personally reviewed following labs and imaging studies  CBC: Recent Labs  Lab 02/11/21 1208 02/12/21 0503 02/13/21 0457 02/14/21 0508 02/15/21 0438 02/16/21 0420 02/18/21 0505  WBC 10.9*   < > 12.7* 14.2* 12.2* 12.2* 19.6*  NEUTROABS 7.8*  --   --   --   --  8.4* 18.2*  HGB 7.8*   < > 7.0* 8.4* 7.6* 8.2* 7.8*  HCT 26.6*   < > 23.8* 26.5* 24.8* 26.7* 26.7*  MCV 102.7*   < > 99.6 96.7 97.3 99.3 98.2  PLT 354   < > 345 368 328 377 470*   < > = values in this interval not displayed.    Basic Metabolic Panel: Recent Labs  Lab 02/11/21 1208 02/12/21 0503 02/13/21 0457 02/13/21 1031 02/14/21 0508 02/18/21 0505  NA 138 139 139  --  138 138  K 5.1 5.1 5.2* 4.8 4.0 4.8  CL 105 105 106  --  101 103  CO2 28 27 27   --  28 27  GLUCOSE 93 106* 115*  --  95 146*  BUN 32* 33* 34*  --  26* 36*  CREATININE 1.46* 1.51* 1.32*  --  1.43* 1.53*  CALCIUM 8.2* 8.3* 8.6*  --  8.4* 8.4*  MG  --   --  2.4  --  2.2 2.6*    GFR: Estimated Creatinine Clearance: 39.6 mL/min (A)  (by C-G formula based on SCr of 1.53 mg/dL (H)).  Liver Function Tests: Recent Labs  Lab 02/11/21 1208 02/13/21 0457 02/14/21 0508  AST 17 29 23   ALT 8 8 9   ALKPHOS 57 56 57  BILITOT 0.4 0.5 0.7  PROT 6.3* 5.9* 6.1*  ALBUMIN 2.9* 2.8* 2.8*    CBG: No results for input(s): GLUCAP in the last 168 hours.  Recent Results (from the past 240 hour(s))  Blood Culture (routine x 2)     Status: None   Collection Time: 02/11/21 12:15 PM   Specimen: Left Antecubital; Blood  Result Value Ref Range Status   Specimen Description LEFT ANTECUBITAL  Final   Special Requests   Final    BOTTLES DRAWN AEROBIC AND ANAEROBIC Blood Culture results may not be optimal due to an excessive volume of blood received in culture bottles  Culture   Final    NO GROWTH 6 DAYS Performed at [facility], [address], [place], [place] [address]    Report Status 02/17/2021 FINAL  Final  Blood Culture (routine x 2)     Status: None   Collection Time: 02/11/21 12:19 PM   Specimen: Right Antecubital; Blood  Result Value Ref Range Status   Specimen Description RIGHT ANTECUBITAL  Final   Special Requests   Final    BOTTLES DRAWN AEROBIC AND ANAEROBIC Blood Culture adequate volume   Culture   Final    NO GROWTH 6 DAYS Performed at [facility], [address], [place], [place] [address]    Report Status 02/17/2021 FINAL  Final  Resp Panel by RT-PCR (Flu A&B, Covid) Nasopharyngeal Swab     Status: None   Collection Time: 02/11/21 12:25 PM   Specimen: Nasopharyngeal Swab; Nasopharyngeal(NP) swabs in vial transport medium  Result Value Ref Range Status   SARS Coronavirus 2 by RT PCR NEGATIVE NEGATIVE Final    Comment: (NOTE) SARS-CoV-2 target nucleic acids are NOT DETECTED.  The SARS-CoV-2 RNA is generally detectable in upper respiratory specimens during the acute phase of infection. The lowest concentration of SARS-CoV-2 viral copies this assay can detect is 138 copies/mL. A negative result does not  preclude SARS-Cov-2 infection and should not be used as the sole basis for treatment or other patient management decisions. A negative result may occur with  improper specimen collection/handling, submission of specimen other than nasopharyngeal swab, presence of viral mutation(s) within the areas targeted by this assay, and inadequate number of viral copies(<138 copies/mL). A negative result must be combined with clinical observations, patient history, and epidemiological information. The expected result is Negative.  Fact Sheet for Patients:  EntrepreneurPulse.com.au  Fact Sheet for Healthcare Providers:  IncredibleEmployment.be  This test is no t yet approved or cleared by the [place] FDA and  has been authorized for detection and/or diagnosis of SARS-CoV-2 by FDA under an Emergency Use Authorization (EUA). This EUA will remain  in effect (meaning this test can be used) for the duration of the COVID-19 declaration under Section 564(b)(1) of the Act, 21 U.S.C.section 360bbb-3(b)(1), unless the authorization is terminated  or revoked sooner.       Influenza A by PCR NEGATIVE NEGATIVE Final   Influenza B by PCR NEGATIVE NEGATIVE Final    Comment: (NOTE) The Xpert Xpress SARS-CoV-2/FLU/RSV plus assay is intended as an aid in the diagnosis of influenza from Nasopharyngeal swab specimens and should not be used as a sole basis for treatment. Nasal washings and aspirates are unacceptable for Xpert Xpress SARS-CoV-2/FLU/RSV testing.  Fact Sheet for Patients: EntrepreneurPulse.com.au  Fact Sheet for Healthcare Providers: IncredibleEmployment.be  This test is not yet approved or cleared by the [place] FDA and has been authorized for detection and/or diagnosis of SARS-CoV-2 by FDA under an Emergency Use Authorization (EUA). This EUA will remain in effect (meaning this test can be used) for the  duration of the COVID-19 declaration under Section 564(b)(1) of the Act, 21 U.S.C. section 360bbb-3(b)(1), unless the authorization is terminated or revoked.  Performed at [facility], [address] [place], [place] [address]   Gram stain     Status: None   Collection Time: 02/13/21 11:35 AM   Specimen: Pleura  Result Value Ref Range Status   Specimen Description PLEURAL  Final   Special Requests NONE  Final   Gram Stain   Final    NO ORGANISMS SEEN WBC  PRESENT,BOTH PMN AND MONONUCLEAR Gram Stain Report Called to,Read Back By and Verified With: A CRADDOCK RN [place] Performed at [facility], [address] [place], [place] [address]    Report Status 02/13/2021 FINAL  Final  Culture, body fluid w Gram Stain-bottle     Status: None (Preliminary result)   Collection Time: 02/13/21 11:35 AM   Specimen: Pleura  Result Value Ref Range Status   Specimen Description PLEURAL  Final   Special Requests   Final    BOTTLES DRAWN AEROBIC AND ANAEROBIC Blood Culture adequate volume   Culture   Final    NO GROWTH 4 DAYS Performed at [facility], [address], [place], [place] [address]    Report Status PENDING  Incomplete     Radiology Studies: No results found.  Scheduled Meds:  arformoterol  15 mcg Nebulization BID   budesonide  0.5 mg Nebulization BID   diltiazem  180 mg Oral Daily   dronedarone  400 mg Oral BID WC   folic acid  1 mg Oral Daily   guaiFENesin  1,200 mg Oral BID   melatonin  6 mg Oral QHS   methylPREDNISolone (SOLU-MEDROL) injection  40 mg Intravenous Q12H   pantoprazole  40 mg Oral BID   revefenacin  175 mcg Nebulization Daily   rosuvastatin  5 mg Oral Daily   senna-docusate  2 tablet Oral QHS   vitamin B-12  1,000 mcg Oral Daily   Continuous Infusions:   LOS: 7 days   Time spent: 35 mins  Blake Cherry Wynetta Emery, MD How to contact the [facility] Attending or Consulting provider [place] or covering provider during after hours [place], for this patient?   Check the care team in [facility] and look for a) attending/consulting TRH provider listed and b) the [facility] team listed Log into [email or web address] and use [place]'s universal password to access. If you do not have the password, please contact the hospital operator. Locate the [facility] provider you are looking for under [facility] and page to a number that you can be directly reached. If you still have difficulty reaching the provider, please page the [facility] (Director on Call) for the Hospitalists listed on amion for assistance.  02/18/2021, 9:16 AM

## 2021-02-18 NOTE — Progress Notes (Signed)
° ° °  Subjective: Feels he has improved from admission respiratory-wise but still notes times of "gasping for breath". On 4 liters O2 nasal cannula. No abdominal pain, N/V. No overt GI bleeding.   Objective: Vital signs in last 24 hours: Temp:  [97.9 F (36.6 C)-98.5 F (36.9 C)] 97.9 F (36.6 C) (12/26 2119) Pulse Rate:  [86-100] 86 (12/26 2119) Resp:  [18] 18 (12/26 2119) BP: (96-123)/(46-59) 99/52 (12/26 2119) SpO2:  [94 %-98 %] 98 % (12/27 0729) Last BM Date: 02/16/21 General:   Alert and oriented, pleasant, 4 liters nasal cannula O2. No distress.  Abdomen:  Sitting up in chair. Bowel sounds present, soft, non-tender, non-distended.  Extremities:  With 1+ lower extremity edema and pedal edema.  Neurologic:  Alert and  oriented x4 Psych:  Alert and cooperative. Normal mood and affect.  Intake/Output from previous day: 12/26 0701 - 12/27 0700 In: 720 [P.O.:720] Out: -  Intake/Output this shift: No intake/output data recorded.  Lab Results: Recent Labs    02/16/21 0420 02/18/21 0505  WBC 12.2* 19.6*  HGB 8.2* 7.8*  HCT 26.7* 26.7*  PLT 377 470*   BMET Recent Labs    02/18/21 0505  NA 138  K 4.8  CL 103  CO2 27  GLUCOSE 146*  BUN 36*  CREATININE 1.53*  CALCIUM 8.4*     Assessment: 75 y/o male with history of Afib on Eliquis, COPD, Aflutter, CKD, HTN, previous CVA presenting to the ED with 2-3 day h/o worsening SOB and admitted with acute respiratory failure with hypoxia in setting of pneumonia and anemia. hemoglobin of 7.8 on presentation.   Anemia: hemoglobin of 7.8 on presentation. Previously 10.6 several weeks before admission, and Hgb 14 a year ago. Found to have IDA with profoundly low ferritin at 12, iron low at 34, folate and B12 deficiency also noted.  Patient chronically anticoagulated on Eliquis and takes ASA 81 mg daily, naproxen 1-2 per week. Last colonoscopy 2007, EGD even prior to that. No overt GI bleeding. Eliquis remains on hold. Received 1  unit PRBCs this admission (02/13/21). Hgb fluctuating but overall without significant change over past few days.   From a respiratory standpoint, he is not a candidate for endoscopic evaluation. Hopefully, colonoscopy/EGD could be done prior to discharge.   Plan: Will follow peripherally Continue PPI BID Transfuse as needed If improves, consider colonoscopy/EGD prior to discharge.   Gelene Mink, PhD, ANP-BC [facility]    LOS: 7 days    02/18/2021, 8:23 AM

## 2021-02-19 DIAGNOSIS — J9601 Acute respiratory failure with hypoxia: Secondary | ICD-10-CM | POA: Diagnosis not present

## 2021-02-19 LAB — ANA W/REFLEX IF POSITIVE: Anti Nuclear Antibody (ANA): NEGATIVE

## 2021-02-19 LAB — CULTURE, BODY FLUID W GRAM STAIN -BOTTLE
Culture: NO GROWTH
Special Requests: ADEQUATE

## 2021-02-19 LAB — BASIC METABOLIC PANEL
Anion gap: 6 (ref 5–15)
BUN: 43 mg/dL — ABNORMAL HIGH (ref 8–23)
CO2: 27 mmol/L (ref 22–32)
Calcium: 8.2 mg/dL — ABNORMAL LOW (ref 8.9–10.3)
Chloride: 104 mmol/L (ref 98–111)
Creatinine, Ser: 1.63 mg/dL — ABNORMAL HIGH (ref 0.61–1.24)
GFR, Estimated: 44 mL/min — ABNORMAL LOW (ref >60)
Glucose, Bld: 160 mg/dL — ABNORMAL HIGH (ref 70–99)
Potassium: 5 mmol/L (ref 3.5–5.1)
Sodium: 137 mmol/L (ref 135–145)

## 2021-02-19 LAB — CBC
HCT: 25.2 % — ABNORMAL LOW (ref 39.0–52.0)
Hemoglobin: 7.6 g/dL — ABNORMAL LOW (ref 13.0–17.0)
MCH: 29.6 pg (ref 26.0–34.0)
MCHC: 30.2 g/dL (ref 30.0–36.0)
MCV: 98.1 fL (ref 80.0–100.0)
Platelets: 450 10*3/uL — ABNORMAL HIGH (ref 150–400)
RBC: 2.57 MIL/uL — ABNORMAL LOW (ref 4.22–5.81)
RDW: 14.5 % (ref 11.5–15.5)
WBC: 13.1 10*3/uL — ABNORMAL HIGH (ref 4.0–10.5)
nRBC: 0.3 % — ABNORMAL HIGH (ref 0.0–0.2)

## 2021-02-19 LAB — QUANTIFERON-TB GOLD PLUS

## 2021-02-19 MED ORDER — FUROSEMIDE 20 MG PO TABS: 20.0000 mg | ORAL_TABLET | Freq: Every day | ORAL | 1 refills | Status: AC | PRN

## 2021-02-19 MED ORDER — FERROUS SULFATE 325 (65 FE) MG PO TABS
325.0000 mg | ORAL_TABLET | Freq: Two times a day (BID) | ORAL | 3 refills | Status: AC
Start: 2021-02-19 — End: 2022-02-19

## 2021-02-19 MED ORDER — PREDNISONE 10 MG PO TABS: 40.0000 mg | ORAL_TABLET | Freq: Every day | ORAL | 0 refills | Status: AC

## 2021-02-19 MED ORDER — CYANOCOBALAMIN 1000 MCG PO TABS: 1000.0000 ug | ORAL_TABLET | Freq: Every day | ORAL | 0 refills | Status: AC

## 2021-02-19 MED ORDER — FOLIC ACID 1 MG PO TABS: 1.0000 mg | ORAL_TABLET | Freq: Every day | ORAL | 0 refills | Status: AC

## 2021-02-19 NOTE — TOC Transition Note (Signed)
Transition of Care [facility]) - CM/SW Discharge Note   Patient Details  Name: Blake Cherry MRN: [id number] Date of Birth: Nov 05, 1945  [facility] of [facility]) CM/SW Contact:  Villa Herb, LCSWA Phone Number: 02/19/2021, 2:23 PM   Clinical Narrative:    TOC updated that pt will need home O2 set up prior to D/C today. CSW spoke with pt about preference in DME company. Pt would like to work with [employer]. CSW sent pts referral to [facility]. Due to pts insurance pt will have to get prior auth. Pt can pay $225 and then get refunded once auth is approved. Pt is agreeable to this. CSW updated Brandi with [employer] who states they will begin working on the O2 and will work on payment. TOC to follow.  Final next level of care: Home/Self Care Barriers to Discharge: No Barriers Identified   Patient Goals and CMS Choice Patient states their goals for this hospitalization and ongoing recovery are:: return home CMS Medicare.gov Compare Post Acute Care list provided to:: Patient Choice offered to / list presented to : Patient  Discharge Placement                       Discharge Plan and Services                DME Arranged: Oxygen DME Agency: [facility] Date DME Agency Contacted: 02/19/21   Representative spoke with at DME Agency: Merry Proud            Social Determinants of Health (SDOH) Interventions     Readmission Risk Interventions No flowsheet data found.

## 2021-02-19 NOTE — Care Management Important Message (Signed)
Important Message  Patient Details  Name: Blake Cherry MRN: [id number] Date of Birth: 01/19/1946   Medicare Important Message Given:  Yes  Reviewed Medicare IM with patient via room phone.  Confirmed he still has copy left in room for reference.    [name] 02/19/2021, 3:12 PM

## 2021-02-19 NOTE — Progress Notes (Signed)
SATURATION QUALIFICATIONS: (This note is used to comply with regulatory documentation for home oxygen)  Patient Saturations on Room Air at Rest = 95%  Patient Saturations on Room Air while Ambulating = 84%  Patient Saturations on 4 Liters of oxygen while Ambulating = 90%  Please briefly explain why patient needs home oxygen:

## 2021-02-19 NOTE — Progress Notes (Signed)
Physical Therapy Treatment Patient Details Name: Blake Cherry MRN: [id number] DOB: May 01, 1945 Today's Date: 02/19/2021   History of Present Illness Blake AKRIDGE is a 75 y.o. male with medical history significant for COPD, atrial flutter, CKD 3, hypertension, stroke.  Patient presented to the ED with complaints of difficulty breathing of 2 to 3 days.  Reports difficulty breathing at rest, that worsens with exertion.  Reports he is unable to lie back to sleep due to difficulty breathing.,  And sleeps sitting up.  He also reports a cough that is dry, and nasal congestion.  He has chronic cough that has not significantly changed from his baseline.  He denies chest pain.  No lower extremity swelling.  He is on Eliquis and reports compliance.   He denies black stools, no blood in stools.  No vomiting.  No abdominal pain.  He takes 400 mg of ibuprofen very occasionally, on the average 2-3 times a month.     Patient's wife is on home O2, over the past few days, he has had to share his wife's oxygen.    PT Comments    Assessed need for O2 at home this session.  RN walked pt with O2 earlier today with vital signs WNL.  Pt with decreased O2 A to 98% seated, decreased to 84% following standing for 30" and requested to sit back down.  Pt with long cough that also reduced O2 sat %.  Pt limited by fatigue, requested not to complete any gait training.  EOS pt left in chair with call bell within reach and wife in room.    Recommendations for follow up therapy are one component of a multi-disciplinary discharge planning process, led by the attending physician.  Recommendations may be updated based on patient status, additional functional criteria and insurance authorization.  Follow Up Recommendations  Home health PT     Assistance Recommended at Discharge PRN  Equipment Recommendations  None recommended by PT    Recommendations for Other Services       Precautions / Restrictions  Precautions Precautions: Fall Restrictions Weight Bearing Restrictions: No     Mobility  Bed Mobility               General bed mobility comments: Sitting in chair upon entrance    Transfers Overall transfer level: Independent                 General transfer comment: Standing without O2 A decreased O2 sat to 84%.    Ambulation/Gait               General Gait Details: Pt limited by fatigue following walking with O2% with nurse prior PT session.  Requested not to walk again.   Stairs             Wheelchair Mobility    Modified Rankin (Stroke Patients Only)       Balance                                            Cognition Arousal/Alertness: Awake/alert Behavior During Therapy: WFL for tasks assessed/performed Overall Cognitive Status: Within Functional Limits for tasks assessed  Exercises      General Comments        Pertinent Vitals/Pain Pain Assessment: No/denies pain    Home Living                          Prior Function            PT Goals (current goals can now be found in the care plan section)      Frequency    Min 2X/week      PT Plan Current plan remains appropriate    Co-evaluation              AM-PAC PT "6 Clicks" Mobility   Outcome Measure  Help needed turning from your back to your side while in a flat bed without using bedrails?: None Help needed moving from lying on your back to sitting on the side of a flat bed without using bedrails?: None Help needed moving to and from a bed to a chair (including a wheelchair)?: None Help needed standing up from a chair using your arms (e.g., wheelchair or bedside chair)?: None Help needed to walk in hospital room?: A Little Help needed climbing 3-5 steps with a railing? : A Little 6 Click Score: 22    End of Session Equipment Utilized During Treatment:  Oxygen Activity Tolerance: Patient limited by fatigue Patient left: in chair;with call bell/phone within reach;with family/visitor present Nurse Communication: Mobility status PT Visit Diagnosis: Unsteadiness on feet (R26.81);Other abnormalities of gait and mobility (R26.89);Muscle weakness (generalized) (M62.81)     Time: 6384-6659 PT Time Calculation (min) (ACUTE ONLY): 12 min  Charges:  $Therapeutic Activity: 8-22 mins                    Becky Sax, LPTA/CLT; CBIS [phone]   Juel Burrow 02/19/2021, 11:52 AM

## 2021-02-19 NOTE — Discharge Summary (Signed)
Physician Discharge Summary  Blake Cherry [id number]:[id number] DOB: [birth year] DOA: 02/11/2021  PCP: [name], MD  Admit date: 02/11/2021  Discharge date: 02/19/2021  Admitted From:Home  Disposition:  Home  Recommendations for Outpatient Follow-up:  Follow up with PCP in 1-2 weeks Continue on prednisone as prescribed below Follow-up with pulmonology outpatient  Follow-up with GI outpatient in the next 3-4 weeks Continue B12, folic acid, and iron supplementation as prescribed below  Home Health: None  Equipment/Devices: Home 4 L nasal cannula  Discharge Condition:Stable  CODE STATUS: Full  Diet recommendation: Heart Healthy  Brief/Interim Summary:  75 y.o. male with medical history significant for COPD, atrial flutter, CKD 3, hypertension, stroke.  presented to the ED with complaints of difficulty breathing of 2 to 3 days.  Patient was admitted with acute hypoxemic respiratory failure that was multifactorial in the setting of COPD exacerbation as well as pleural effusions.  He had undergone thoracentesis on 12/22.  He was also noted to be anemic and required 1 unit PRBC transfusion.  His apixaban was held for his chronic atrial fibrillation in the setting of his acute anemia.  GI was consulted with plans for upper and lower endoscopy once his respiratory status improved, however his respiratory status demonstrated very slow improvement over several days.  It was ultimately decided to restart his home Eliquis once his hemoglobin levels were noted to be stable.  He had no overt bleeding during the course of his stay.  His hemoglobin levels continue to remain stable and his respiratory status has improved to near baseline.  He is eager to go home today and will require 4 L nasal cannula supplementation.  He has been encouraged to remain on prednisone as prescribed along with the very supplementation as noted above.  He will follow-up closely with GI and pulmonary in the near  future.  Discharge Diagnoses:  Principal Problem:   Acute respiratory failure with hypoxia (HCC) Active Problems:   Essential hypertension, benign   COPD severe GOLD III/ active smoker   COPD with acute exacerbation (HCC)   CRI (chronic renal insufficiency), stage 3 (moderate) (HCC)   Atrial flutter (HCC)   Anticoagulated   Acute anemia   B12 deficiency  Principal discharge diagnosis: Acute hypoxemic respiratory failure-multifactorial.  Acute anemia with noted B12 and folic acid deficiency.  Discharge Instructions  Discharge Instructions     Diet - low sodium heart healthy   Complete by: As directed    Increase activity slowly   Complete by: As directed       Allergies as of 02/19/2021   No Known Allergies      Medication List     TAKE these medications    albuterol (2.5 MG/3ML) 0.083% nebulizer solution Commonly known as: PROVENTIL USE 1 VIAL IN NEBULIZER EVERY 4 HOURS AS NEEDED FOR WHEEZING OR SHORTNESS OF BREATH   ProAir HFA 108 (90 Base) MCG/ACT inhaler Generic drug: albuterol INHALE 2 PUFFS BY MOUTH EVERY 6 HOURS AS NEEDED FOR WHEEZING FOR SHORTNESS OF BREATH   apixaban 5 MG Tabs tablet Commonly known as: Eliquis Take 1 tablet (5 mg total) by mouth 2 (two) times daily.   Breztri Aerosphere 160-9-4.8 MCG/ACT Aero Generic drug: Budeson-Glycopyrrol-Formoterol Inhale 2 puffs into the lungs in the morning and at bedtime.   budesonide 0.5 MG/2ML nebulizer solution Commonly known as: PULMICORT USE ONE VIAL IN NEBULIZER TWICE DAILY   cyanocobalamin 1000 MCG tablet Take 1 tablet (1,000 mcg total) by mouth daily. Start taking on: February 20, 2021   diltiazem 180 MG 24 hr capsule Commonly known as: CARDIZEM CD Take 1 capsule (180 mg total) by mouth daily.   folic acid 1 MG tablet Commonly known as: FOLVITE Take 1 tablet (1 mg total) by mouth daily. Start taking on: February 20, 2021   furosemide 20 MG tablet Commonly known as: LASIX Take 20 mg by  mouth daily as needed.   guaiFENesin 600 MG 12 hr tablet Commonly known as: MUCINEX Take 1 tablet (600 mg total) by mouth 2 (two) times daily. What changed:  when to take this reasons to take this   mometasone 0.1 % cream Commonly known as: Elocon Apply BID PRN   Multaq 400 MG tablet Generic drug: dronedarone Take 1 tablet (400 mg total) by mouth 2 (two) times daily with a meal.   predniSONE 10 MG tablet Commonly known as: DELTASONE Take 4 tablets (40 mg total) by mouth daily for 5 days. What changed:  how much to take how to take this when to take this additional instructions   rosuvastatin 5 MG tablet Commonly known as: Crestor Take 1 tablet (5 mg total) by mouth daily.               Durable Medical Equipment  (From admission, onward)           Start     Ordered   02/19/21 1312  For home use only DME oxygen  Once       Question Answer Comment  Length of Need Lifetime   Mode or (Route) Nasal cannula   Liters per Minute 4   Frequency Continuous (stationary and portable oxygen unit needed)   Oxygen conserving device Yes   Oxygen delivery system Gas      02/19/21 1311            Follow-up Information     Kathyrn Drown, MD. Schedule an appointment as soon as possible for a visit in 1 week(s).   Specialty: Family Medicine Contact information: [place] [address] [phone]         Tanda Rockers, MD. Schedule an appointment as soon as possible for a visit in 1 week(s).   Specialty: Pulmonary Disease Contact information: [place] [address] [phone]                No Known Allergies  Consultations: GI Pulmonary   Procedures/Studies: DG Chest 1 View  Result Date: 02/13/2021 CLINICAL DATA:  RIGHT pleural effusion post pneumothorax EXAM: CHEST  1 VIEW COMPARISON:  Earlier study of 02/13/2021 FINDINGS: Enlargement of cardiac silhouette with vascular congestion.  Atherosclerotic calcification aorta. Loculated LEFT pleural effusion unchanged. Decreased RIGHT pleural effusion and basilar atelectasis. Persistent pulmonary infiltrates. RIGHT apical scarring. No definite pneumothorax or acute osseous findings. IMPRESSION: No pneumothorax following RIGHT thoracentesis. Remainder of exam unchanged. Electronically Signed   By: Lavonia Dana M.D.   On: 02/13/2021 12:06   DG Chest 2 View  Result Date: 01/28/2021 CLINICAL DATA:  Chest pain EXAM: CHEST - 2 VIEW COMPARISON:  CT 07/09/2020 FINDINGS: Left-sided volume loss, peripheral mid lung parenchymal scarring, and laterally loculated pleural effusion within the left hemithorax is unchanged. Interstitial coarsening is again noted. No superimposed focal pulmonary infiltrate or nodule. Mild right apical pleuroparenchymal scarring again noted. No pneumothorax. No pleural effusion on the right. Cardiac size within normal limits. No acute bone abnormality. IMPRESSION: Stable parenchymal scarring and laterally loculated left-sided pleural effusion.  No acute abnormality. Electronically Signed   By: Fidela Salisbury M.D.   On: 01/28/2021 00:58   CT Angio Chest Pulmonary Embolism (PE) W or WO Contrast  Result Date: 02/11/2021 CLINICAL DATA:  Dyspnea, hypoxia, COPD EXAM: CT ANGIOGRAPHY CHEST WITH CONTRAST TECHNIQUE: Multidetector CT imaging of the chest was performed using the standard protocol during bolus administration of intravenous contrast. Multiplanar CT image reconstructions and MIPs were obtained to evaluate the vascular anatomy. CONTRAST:  23mL OMNIPAQUE IOHEXOL 350 MG/ML SOLN COMPARISON:  01/28/2021 FINDINGS: Cardiovascular: There is adequate opacification of the pulmonary arterial tree. No intraluminal filling defect identified to suggest acute pulmonary embolism. The central pulmonary arteries are mildly enlarged in keeping with changes of pulmonary arterial hypertension. Extensive multi-vessel coronary artery calcification.  Global cardiac size is within normal limits. No pericardial effusion. Moderate atherosclerotic calcification within the thoracic aorta. Small superimposed atheromatous plaque within the descending thoracic aorta without ulceration. No aortic aneurysm. Mediastinum/Nodes: Shotty precarinal adenopathy is stable. No additional pathologic thoracic adenopathy is identified. Esophagus unremarkable. Thyroid unremarkable. Lungs/Pleura: Mild emphysema. Parenchymal scarring with rounded atelectasis noted within the basilar lingula and left-sided volume loss is again noted. Bibasilar cylindrical bronchiectasis again noted. Since the prior examination, there has developed scattered ground-glass infiltrate within the posterior segment of the right upper lobe and lateral segment of the right middle lobe. Additionally, there has developed progressive bronchial wall thickening and airway impaction. Together, the findings may reflect changes of superimposed acute inflammation or infection. Small right pleural effusion has developed. No pneumothorax. Upper Abdomen: No acute abnormality. Musculoskeletal: No chest wall abnormality. No acute or significant osseous findings. Review of the MIP images confirms the above findings. IMPRESSION: No pulmonary embolism. Interval development of scattered parenchymal infiltrate, progressive bronchiolar inflammation, and scattered airway impaction suggesting a superimposed acute infectious or inflammatory process. Interval development of a small right pleural effusion. Mild emphysema. Extensive multi-vessel coronary artery calcification. Morphologic changes in keeping with pulmonary arterial hypertension. Aortic Atherosclerosis (ICD10-I70.0) and Emphysema (ICD10-J43.9). Electronically Signed   By: Fidela Salisbury M.D.   On: 02/11/2021 19:42   CT Angio Chest PE W and/or Wo Contrast  Result Date: 01/28/2021 CLINICAL DATA:  Chest pain and shortness of breath for 1 hour EXAM: CT ANGIOGRAPHY CHEST  WITH CONTRAST TECHNIQUE: Multidetector CT imaging of the chest was performed using the standard protocol during bolus administration of intravenous contrast. Multiplanar CT image reconstructions and MIPs were obtained to evaluate the vascular anatomy. CONTRAST:  48mL OMNIPAQUE IOHEXOL 350 MG/ML SOLN COMPARISON:  Chest x-ray from earlier in the same day, PET-CT from 07/29/2020. FINDINGS: Cardiovascular: Diffuse atherosclerotic calcifications of the thoracic aorta and its branches are noted. No aneurysmal dilatation or dissection is noted. Heart is mildly enlarged in size. Pulmonary artery shows a normal branching pattern. No fill coil filling defect to suggest pulmonary embolism is noted. Coronary calcifications are noted. Mediastinum/Nodes: Thoracic inlet is within normal limits. No sizable hilar or mediastinal adenopathy is noted. The esophagus as visualized is within normal limits. Lungs/Pleura: Emphysematous changes are identified particularly in the upper lobes bilaterally. The right lung is well aerated without focal infiltrate or effusion. Mild bronchial wall thickening is noted particularly in the lower lobe similar to that seen on prior PET-CT. Mild atelectatic changes are noted in the right lower lobe. Loculated pleural effusion is noted on the left stable in appearance from the prior PET-CT. Lingular somewhat bilobed nodular density is again identified similar to that seen on prior PET-CT. No new nodules are seen. Bronchial thickening is  noted in the lower lobe on the left as well Upper Abdomen: Visualized upper abdomen is within normal limits. Musculoskeletal: Degenerative changes of the thoracic spine are seen. No acute rib abnormality is noted. No compression deformity is seen. Review of the MIP images confirms the above findings. IMPRESSION: No evidence of pulmonary emboli. Loculated left pleural effusion stable in appearance from prior PET-CT. Lingular somewhat bilobed nodular density is again  identified similar to that seen on prior PET-CT. Bronchial thickening in the lower lobes bilaterally with mild atelectatic changes. Aortic Atherosclerosis (ICD10-I70.0) and Emphysema (ICD10-J43.9). Electronically Signed   By: Inez Catalina M.D.   On: 01/28/2021 03:59   DG CHEST PORT 1 VIEW  Result Date: 02/16/2021 CLINICAL DATA:  Acute on chronic respiratory failure EXAM: PORTABLE CHEST 1 VIEW COMPARISON:  02/13/2021 FINDINGS: Patchy bilateral airspace disease again noted, unchanged. Loculated left pleural effusion, stable. Heart is normal size. Aortic atherosclerosis. No acute bony abnormality. IMPRESSION: Stable patchy bilateral airspace disease and loculated left pleural effusion Electronically Signed   By: Rolm Baptise M.D.   On: 02/16/2021 05:14   DG CHEST PORT 1 VIEW  Result Date: 02/13/2021 CLINICAL DATA:  Shortness of breath, chest pain EXAM: PORTABLE CHEST 1 VIEW COMPARISON:  02/11/2021 FINDINGS: No significant change in AP portable chest radiograph with a moderate, loculated appearing left pleural effusion and a small right pleural effusion. Unchanged diffuse bilateral interstitial pulmonary opacity. Cardiomegaly. IMPRESSION: 1. No significant change in AP portable chest radiograph with a moderate, loculated appearing left pleural effusion and a small right pleural effusion. 2. Unchanged diffuse bilateral interstitial pulmonary opacity, consistent with edema or infection. 3.  Cardiomegaly. Electronically Signed   By: Delanna Ahmadi M.D.   On: 02/13/2021 08:52   DG Chest Port 1 View  Result Date: 02/11/2021 CLINICAL DATA:  Dyspnea, COPD, smoker EXAM: PORTABLE CHEST 1 VIEW COMPARISON:  01/28/2021 chest radiograph. FINDINGS: Stable cardiomediastinal silhouette with top normal heart size. No pneumothorax. Loculated small to moderate lateral basilar left pleural effusion/thickening, mildly increased. Slight blunting of the right costophrenic angle. Cephalization of the pulmonary vasculature without  overt pulmonary edema. Patchy and streaky opacities in the mid to lower left lung appear unchanged. IMPRESSION: 1. Loculated small to moderate lateral basilar left pleural effusion/thickening, mildly increased. 2. Chronic patchy and streaky opacities in the mid to lower left lung, favor nonspecific scarring. 3. Slight blunting of the right costophrenic angle, possible small right pleural effusion. Electronically Signed   By: Ilona Sorrel M.D.   On: 02/11/2021 11:26   US THORACENTESIS ASP PLEURAL SPACE W/IMG GUIDE  Result Date: 02/13/2021 INDICATION: Moderate right pleural effusion Very small- left effusion EXAM: ULTRASOUND GUIDED right THORACENTESIS MEDICATIONS: 10 cc 1% lidocaine. COMPLICATIONS: None immediate. PROCEDURE: An ultrasound guided thoracentesis was thoroughly discussed with the patient and questions answered. The benefits, risks, alternatives and complications were also discussed. The patient understands and wishes to proceed with the procedure. Written consent was obtained. Ultrasound was performed to localize and mark an adequate pocket of fluid in the right chest. The area was then prepped and draped in the normal sterile fashion. 1% Lidocaine was used for local anesthesia. Under ultrasound guidance a Yueh catheter was introduced. Thoracentesis was performed. The catheter was removed and a dressing applied. FINDINGS: A total of approximately 600 cc of yellow fluid was removed. Samples were sent to the laboratory as requested by the clinical team. IMPRESSION: Successful ultrasound guided right thoracentesis yielding 600 cc yellow fluid of pleural fluid. CXR: No ptx per Dr  Boles Read by Lavonia Drafts PAC Electronically Signed   By: Lavonia Dana M.D.   On: 02/13/2021 13:18     Discharge Exam: Vitals:   02/19/21 1148 02/19/21 1410  BP:  (!) 98/49  Pulse:  86  Resp:    Temp:  97.9 F (36.6 C)  SpO2: 100% 100%   Vitals:   02/19/21 0804 02/19/21 0809 02/19/21 1148 02/19/21 1410  BP:     (!) 98/49  Pulse:    86  Resp:      Temp:    97.9 F (36.6 C)  TempSrc:    Oral  SpO2: 100% 100% 100% 100%  Weight:      Height:        General: Pt is alert, awake, not in acute distress Cardiovascular: RRR, S1/S2 +, no rubs, no gallops Respiratory: CTA bilaterally, no wheezing, no rhonchi Abdominal: Soft, NT, ND, bowel sounds + Extremities: no edema, no cyanosis    The results of significant diagnostics from this hospitalization (including imaging, microbiology, ancillary and laboratory) are listed below for reference.     Microbiology: Recent Results (from the past 240 hour(s))  Blood Culture (routine x 2)     Status: None   Collection Time: 02/11/21 12:15 PM   Specimen: Left Antecubital; Blood  Result Value Ref Range Status   Specimen Description LEFT ANTECUBITAL  Final   Special Requests   Final    BOTTLES DRAWN AEROBIC AND ANAEROBIC Blood Culture results may not be optimal due to an excessive volume of blood received in culture bottles   Culture   Final    NO GROWTH 6 DAYS Performed at [facility], [address], [place], [place] [address]    Report Status 02/17/2021 FINAL  Final  Blood Culture (routine x 2)     Status: None   Collection Time: 02/11/21 12:19 PM   Specimen: Right Antecubital; Blood  Result Value Ref Range Status   Specimen Description RIGHT ANTECUBITAL  Final   Special Requests   Final    BOTTLES DRAWN AEROBIC AND ANAEROBIC Blood Culture adequate volume   Culture   Final    NO GROWTH 6 DAYS Performed at [facility], [address], [place], [place] [address]    Report Status 02/17/2021 FINAL  Final  Resp Panel by RT-PCR (Flu A&B, Covid) Nasopharyngeal Swab     Status: None   Collection Time: 02/11/21 12:25 PM   Specimen: Nasopharyngeal Swab; Nasopharyngeal(NP) swabs in vial transport medium  Result Value Ref Range Status   SARS Coronavirus 2 by RT PCR NEGATIVE NEGATIVE Final    Comment: (NOTE) SARS-CoV-2 target nucleic acids are NOT  DETECTED.  The SARS-CoV-2 RNA is generally detectable in upper respiratory specimens during the acute phase of infection. The lowest concentration of SARS-CoV-2 viral copies this assay can detect is 138 copies/mL. A negative result does not preclude SARS-Cov-2 infection and should not be used as the sole basis for treatment or other patient management decisions. A negative result may occur with  improper specimen collection/handling, submission of specimen other than nasopharyngeal swab, presence of viral mutation(s) within the areas targeted by this assay, and inadequate number of viral copies(<138 copies/mL). A negative result must be combined with clinical observations, patient history, and epidemiological information. The expected result is Negative.  Fact Sheet for Patients:  EntrepreneurPulse.com.au  Fact Sheet for Healthcare Providers:  IncredibleEmployment.be  This test is no t yet approved or cleared by the [place] FDA and  has been  authorized for detection and/or diagnosis of SARS-CoV-2 by FDA under an Emergency Use Authorization (EUA). This EUA will remain  in effect (meaning this test can be used) for the duration of the COVID-19 declaration under Section 564(b)(1) of the Act, 21 U.S.C.section 360bbb-3(b)(1), unless the authorization is terminated  or revoked sooner.       Influenza A by PCR NEGATIVE NEGATIVE Final   Influenza B by PCR NEGATIVE NEGATIVE Final    Comment: (NOTE) The Xpert Xpress SARS-CoV-2/FLU/RSV plus assay is intended as an aid in the diagnosis of influenza from Nasopharyngeal swab specimens and should not be used as a sole basis for treatment. Nasal washings and aspirates are unacceptable for Xpert Xpress SARS-CoV-2/FLU/RSV testing.  Fact Sheet for Patients: EntrepreneurPulse.com.au  Fact Sheet for Healthcare Providers: IncredibleEmployment.be  This test is not yet  approved or cleared by the [place] FDA and has been authorized for detection and/or diagnosis of SARS-CoV-2 by FDA under an Emergency Use Authorization (EUA). This EUA will remain in effect (meaning this test can be used) for the duration of the COVID-19 declaration under Section 564(b)(1) of the Act, 21 U.S.C. section 360bbb-3(b)(1), unless the authorization is terminated or revoked.  Performed at [facility], [address], [place], [place] [address]   Fungus Culture With Stain     Status: None (Preliminary result)   Collection Time: 02/13/21 11:35 AM   Specimen: Pleural, Right  Result Value Ref Range Status   Fungus Stain Final report  Final    Comment: (NOTE) Performed At: [facility] [address] [place], [place] [id number] Rush Farmer MD [id number]    Fungus (Mycology) Culture PENDING  Incomplete   Fungal Source PLEURAL  Final    Comment: Performed at [facility], [address], [place], [place] [address]  Gram stain     Status: None   Collection Time: 02/13/21 11:35 AM   Specimen: Pleura  Result Value Ref Range Status   Specimen Description PLEURAL  Final   Special Requests NONE  Final   Gram Stain   Final    NO ORGANISMS SEEN WBC PRESENT,BOTH PMN AND MONONUCLEAR Gram Stain Report Called to,Read Back By and Verified With: A CRADDOCK RN [phone] Marcos Eke Performed at [facility], [address], [place], [place] [address]    Report Status 02/13/2021 FINAL  Final  Culture, body fluid w Gram Stain-bottle     Status: None   Collection Time: 02/13/21 11:35 AM   Specimen: Pleura  Result Value Ref Range Status   Specimen Description PLEURAL  Final   Special Requests   Final    BOTTLES DRAWN AEROBIC AND ANAEROBIC Blood Culture adequate volume   Culture   Final    NO GROWTH 6 DAYS Performed at [facility], [address] [place], [place] [address]    Report Status 02/19/2021 FINAL  Final  Fungus Culture Result     Status: None   Collection  Time: 02/13/21 11:35 AM  Result Value Ref Range Status   Result 1 Comment  Final    Comment: (NOTE) KOH/Calcofluor preparation:  no fungus observed. Performed At: [facility] [place], [place] [id number] Rush Farmer MD [id number]      Labs: BNP (last 3 results) Recent Labs    01/28/21 0207 02/11/21 1206  BNP 370.0* [id number]*   Basic Metabolic Panel: Recent Labs  Lab 02/13/21 0457 02/13/21 1031 02/14/21 0508 02/18/21 0505 02/19/21 0508  NA 139  --  138 138 137  K 5.2* 4.8 4.0 4.8 5.0  CL 106  --  101 103 104  CO2 27  --  28 27 27   GLUCOSE 115*  --  95 146* 160*  BUN 34*  --  26* 36* 43*  CREATININE 1.32*  --  1.43* 1.53* 1.63*  CALCIUM 8.6*  --  8.4* 8.4* 8.2*  MG 2.4  --  2.2 2.6*  --    Liver Function Tests: Recent Labs  Lab 02/13/21 0457 02/14/21 0508  AST 29 23  ALT 8 9  ALKPHOS 56 57  BILITOT 0.5 0.7  PROT 5.9* 6.1*  ALBUMIN 2.8* 2.8*   No results for input(s): LIPASE, AMYLASE in the last 168 hours. No results for input(s): AMMONIA in the last 168 hours. CBC: Recent Labs  Lab 02/14/21 0508 02/15/21 0438 02/16/21 0420 02/18/21 0505 02/19/21 0508  WBC 14.2* 12.2* 12.2* 19.6* 13.1*  NEUTROABS  --   --  8.4* 18.2*  --   HGB 8.4* 7.6* 8.2* 7.8* 7.6*  HCT 26.5* 24.8* 26.7* 26.7* 25.2*  MCV 96.7 97.3 99.3 98.2 98.1  PLT 368 328 377 470* 450*   Cardiac Enzymes: No results for input(s): CKTOTAL, CKMB, CKMBINDEX, TROPONINI in the last 168 hours. BNP: Invalid input(s): POCBNP CBG: No results for input(s): GLUCAP in the last 168 hours. D-Dimer No results for input(s): DDIMER in the last 72 hours. Hgb A1c No results for input(s): HGBA1C in the last 72 hours. Lipid Profile No results for input(s): CHOL, HDL, LDLCALC, TRIG, CHOLHDL, LDLDIRECT in the last 72 hours. Thyroid function studies No results for input(s): TSH, T4TOTAL, T3FREE, THYROIDAB in the last 72 hours.  Invalid input(s): FREET3 Anemia work up No results  for input(s): VITAMINB12, FOLATE, FERRITIN, TIBC, IRON, RETICCTPCT in the last 72 hours. Urinalysis    Component Value Date/Time   COLORURINE YELLOW 02/11/2021 1225   APPEARANCEUR CLEAR 02/11/2021 1225   LABSPEC 1.020 02/11/2021 1225   PHURINE 5.5 02/11/2021 1225   GLUCOSEU NEGATIVE 02/11/2021 1225   HGBUR NEGATIVE 02/11/2021 [place] 02/11/2021 1225   KETONESUR NEGATIVE 02/11/2021 1225   PROTEINUR NEGATIVE 02/11/2021 1225   UROBILINOGEN 0.2 08/16/2012 1545   NITRITE NEGATIVE 02/11/2021 1225   LEUKOCYTESUR NEGATIVE 02/11/2021 1225   Sepsis Labs Invalid input(s): PROCALCITONIN,  WBC,  LACTICIDVEN Microbiology Recent Results (from the past 240 hour(s))  Blood Culture (routine x 2)     Status: None   Collection Time: 02/11/21 12:15 PM   Specimen: Left Antecubital; Blood  Result Value Ref Range Status   Specimen Description LEFT ANTECUBITAL  Final   Special Requests   Final    BOTTLES DRAWN AEROBIC AND ANAEROBIC Blood Culture results may not be optimal due to an excessive volume of blood received in culture bottles   Culture   Final    NO GROWTH 6 DAYS Performed at [facility] [facility], [address] [place], [place] [address]    Report Status 02/17/2021 FINAL  Final  Blood Culture (routine x 2)     Status: None   Collection Time: 02/11/21 12:19 PM   Specimen: Right Antecubital; Blood  Result Value Ref Range Status   Specimen Description RIGHT ANTECUBITAL  Final   Special Requests   Final    BOTTLES DRAWN AEROBIC AND ANAEROBIC Blood Culture adequate volume   Culture   Final    NO GROWTH 6 DAYS Performed at [facility], [address], [place], [place] [address]    Report Status 02/17/2021 FINAL  Final  Resp Panel by RT-PCR (Flu A&B, Covid) Nasopharyngeal Swab     Status: None   Collection Time: 02/11/21 12:25 PM   Specimen: Nasopharyngeal Swab; Nasopharyngeal(NP) swabs in vial transport medium  Result Value Ref Range Status   SARS Coronavirus 2 by RT PCR  NEGATIVE NEGATIVE Final    Comment: (NOTE) SARS-CoV-2 target nucleic acids are NOT DETECTED.  The SARS-CoV-2 RNA is generally detectable in upper respiratory specimens during the acute phase of infection. The lowest concentration of SARS-CoV-2 viral copies this assay can detect is 138 copies/mL. A negative result does not preclude SARS-Cov-2 infection and should not be used as the sole basis for treatment or other patient management decisions. A negative result may occur with  improper specimen collection/handling, submission of specimen other than nasopharyngeal swab, presence of viral mutation(s) within the areas targeted by this assay, and inadequate number of viral copies(<138 copies/mL). A negative result must be combined with clinical observations, patient history, and epidemiological information. The expected result is Negative.  Fact Sheet for Patients:  BloggerCourse.com  Fact Sheet for Healthcare Providers:  SeriousBroker.it  This test is no t yet approved or cleared by the [place] FDA and  has been authorized for detection and/or diagnosis of SARS-CoV-2 by FDA under an Emergency Use Authorization (EUA). This EUA will remain  in effect (meaning this test can be used) for the duration of the COVID-19 declaration under Section 564(b)(1) of the Act, 21 U.S.C.section 360bbb-3(b)(1), unless the authorization is terminated  or revoked sooner.       Influenza A by PCR NEGATIVE NEGATIVE Final   Influenza B by PCR NEGATIVE NEGATIVE Final    Comment: (NOTE) The Xpert Xpress SARS-CoV-2/FLU/RSV plus assay is intended as an aid in the diagnosis of influenza from Nasopharyngeal swab specimens and should not be used as a sole basis for treatment. Nasal washings and aspirates are unacceptable for Xpert Xpress SARS-CoV-2/FLU/RSV testing.  Fact Sheet for Patients: BloggerCourse.com  Fact Sheet for  Healthcare Providers: SeriousBroker.it  This test is not yet approved or cleared by the [place] FDA and has been authorized for detection and/or diagnosis of SARS-CoV-2 by FDA under an Emergency Use Authorization (EUA). This EUA will remain in effect (meaning this test can be used) for the duration of the COVID-19 declaration under Section 564(b)(1) of the Act, 21 U.S.C. section 360bbb-3(b)(1), unless the authorization is terminated or revoked.  Performed at [facility], [address], [place], [place] [address]   Fungus Culture With Stain     Status: None (Preliminary result)   Collection Time: 02/13/21 11:35 AM   Specimen: Pleural, Right  Result Value Ref Range Status   Fungus Stain Final report  Final    Comment: (NOTE) Performed At: [facility] [address] [place], [place] [id number] Jolene Schimke MD [id number]    Fungus (Mycology) Culture PENDING  Incomplete   Fungal Source PLEURAL  Final    Comment: Performed at [facility], [address], [place], [place] [address]  Gram stain     Status: None   Collection Time: 02/13/21 11:35 AM   Specimen: Pleura  Result Value Ref Range Status   Specimen Description PLEURAL  Final   Special Requests NONE  Final   Gram Stain   Final    NO ORGANISMS SEEN WBC PRESENT,BOTH PMN AND MONONUCLEAR Gram Stain Report Called to,Read Back By and Verified With: A CRADDOCK RN [phone] Marveen Reeks Performed at [facility], [address] [place], [place] [address]  Report Status 02/13/2021 FINAL  Final  Culture, body fluid w Gram Stain-bottle     Status: None   Collection Time: 02/13/21 11:35 AM   Specimen: Pleura  Result Value Ref Range Status   Specimen Description PLEURAL  Final   Special Requests   Final    BOTTLES DRAWN AEROBIC AND ANAEROBIC Blood Culture adequate volume   Culture   Final    NO GROWTH 6 DAYS Performed at [facility], [address] [place], [place] [address]     Report Status 02/19/2021 FINAL  Final  Fungus Culture Result     Status: None   Collection Time: 02/13/21 11:35 AM  Result Value Ref Range Status   Result 1 Comment  Final    Comment: (NOTE) KOH/Calcofluor preparation:  no fungus observed. Performed At: [facility] [place], [place] [id number] Rush Farmer MD [id number]      Time coordinating discharge: 35 minutes  SIGNED:   Rodena Goldmann, DO [facility] 02/19/2021, 2:38 PM  If 7PM-7AM, please contact night-coverage [email or web address]

## 2021-02-20 ENCOUNTER — Ambulatory Visit: Payer: PPO | Admitting: Internal Medicine

## 2021-02-20 ENCOUNTER — Telehealth: Payer: Self-pay

## 2021-02-20 NOTE — Telephone Encounter (Signed)
Transition Care Management Follow-up Telephone Call Date of discharge and from where: 02/19/21  Diagnosis: Acute Respiratory Failure How have you been since you were released from the hospital? Pt states he is doing okay. No issues. Any questions or concerns? No  Items Reviewed: Did the pt receive and understand the discharge instructions provided? Yes  Medications obtained and verified? Yes  Other? No  Any new allergies since your discharge? No  Dietary orders reviewed? Yes Do you have support at home? Yes   Home Care and Equipment/Supplies: Were home health services ordered? no If so, what is the name of the agency? N/A  Has the agency set up a time to come to the patient's home? not applicable Were any new equipment or medical supplies ordered?  Yes: O2 What is the name of the medical supply agency? [facility] Were you able to get the supplies/equipment? yes Do you have any questions related to the use of the equipment or supplies? No  Functional Questionnaire: (I = Independent and D = Dependent) ADLs: I  Bathing/Dressing- I  Meal Prep- I  Eating- I  Maintaining continence- I  Transferring/Ambulation- I  Managing Meds- I  Follow up appointments reviewed:  PCP Hospital f/u appt confirmed? Yes  Scheduled to see Dr. Gerda Diss on 02/26/2021 @ 11:20 am. Specialist Hospital f/u appt confirmed? No  Follow up with Pulmonology and GI have not been made as of today. Are transportation arrangements needed? No  If their condition worsens, is the pt aware to call PCP or go to the Emergency Dept.? Yes Was the patient provided with contact information for the PCP's office or ED? Yes Was to pt encouraged to call back with questions or concerns? Yes

## 2021-02-21 LAB — QUANTIFERON-TB GOLD PLUS: QuantiFERON-TB Gold Plus: UNDETERMINED — AB

## 2021-02-21 LAB — QUANTIFERON-TB GOLD PLUS (RQFGPL)
QuantiFERON Mitogen Value: 0.01 IU/mL
QuantiFERON Nil Value: 0 IU/mL
QuantiFERON TB1 Ag Value: 0.01 IU/mL
QuantiFERON TB2 Ag Value: 0.01 IU/mL

## 2021-02-21 LAB — RHEUMATOID FACTOR: Rheumatoid fact SerPl-aCnc: 12.9 IU/mL (ref <14.0)

## 2021-02-26 ENCOUNTER — Inpatient Hospital Stay: Payer: PPO | Admitting: Family Medicine

## 2021-02-27 ENCOUNTER — Inpatient Hospital Stay (HOSPITAL_COMMUNITY)
Admission: EM | Admit: 2021-02-27 | Discharge: 2021-03-26 | DRG: 871 | Disposition: E | Payer: PPO | Attending: Family Medicine | Admitting: Family Medicine

## 2021-02-27 ENCOUNTER — Encounter (HOSPITAL_COMMUNITY): Payer: Self-pay | Admitting: Emergency Medicine

## 2021-02-27 ENCOUNTER — Other Ambulatory Visit: Payer: Self-pay

## 2021-02-27 ENCOUNTER — Inpatient Hospital Stay (HOSPITAL_COMMUNITY): Payer: PPO

## 2021-02-27 ENCOUNTER — Inpatient Hospital Stay: Payer: PPO | Admitting: Nurse Practitioner

## 2021-02-27 ENCOUNTER — Emergency Department (HOSPITAL_COMMUNITY): Payer: PPO

## 2021-02-27 ENCOUNTER — Inpatient Hospital Stay: Payer: Self-pay

## 2021-02-27 DIAGNOSIS — I5021 Acute systolic (congestive) heart failure: Secondary | ICD-10-CM | POA: Diagnosis not present

## 2021-02-27 DIAGNOSIS — N179 Acute kidney failure, unspecified: Secondary | ICD-10-CM | POA: Diagnosis not present

## 2021-02-27 DIAGNOSIS — R570 Cardiogenic shock: Secondary | ICD-10-CM | POA: Diagnosis not present

## 2021-02-27 DIAGNOSIS — F17200 Nicotine dependence, unspecified, uncomplicated: Secondary | ICD-10-CM | POA: Diagnosis present

## 2021-02-27 DIAGNOSIS — I130 Hypertensive heart and chronic kidney disease with heart failure and stage 1 through stage 4 chronic kidney disease, or unspecified chronic kidney disease: Secondary | ICD-10-CM | POA: Diagnosis present

## 2021-02-27 DIAGNOSIS — R0602 Shortness of breath: Secondary | ICD-10-CM

## 2021-02-27 DIAGNOSIS — Z8673 Personal history of transient ischemic attack (TIA), and cerebral infarction without residual deficits: Secondary | ICD-10-CM

## 2021-02-27 DIAGNOSIS — Z515 Encounter for palliative care: Secondary | ICD-10-CM | POA: Diagnosis not present

## 2021-02-27 DIAGNOSIS — E8729 Other acidosis: Secondary | ICD-10-CM | POA: Diagnosis present

## 2021-02-27 DIAGNOSIS — R5381 Other malaise: Secondary | ICD-10-CM | POA: Diagnosis not present

## 2021-02-27 DIAGNOSIS — R6521 Severe sepsis with septic shock: Secondary | ICD-10-CM | POA: Diagnosis present

## 2021-02-27 DIAGNOSIS — Z20822 Contact with and (suspected) exposure to covid-19: Secondary | ICD-10-CM | POA: Diagnosis not present

## 2021-02-27 DIAGNOSIS — I2510 Atherosclerotic heart disease of native coronary artery without angina pectoris: Secondary | ICD-10-CM | POA: Diagnosis present

## 2021-02-27 DIAGNOSIS — Z9981 Dependence on supplemental oxygen: Secondary | ICD-10-CM

## 2021-02-27 DIAGNOSIS — J9622 Acute and chronic respiratory failure with hypercapnia: Secondary | ICD-10-CM | POA: Diagnosis not present

## 2021-02-27 DIAGNOSIS — Z7951 Long term (current) use of inhaled steroids: Secondary | ICD-10-CM

## 2021-02-27 DIAGNOSIS — G928 Other toxic encephalopathy: Secondary | ICD-10-CM | POA: Diagnosis not present

## 2021-02-27 DIAGNOSIS — G931 Anoxic brain damage, not elsewhere classified: Secondary | ICD-10-CM | POA: Diagnosis present

## 2021-02-27 DIAGNOSIS — I480 Paroxysmal atrial fibrillation: Secondary | ICD-10-CM | POA: Diagnosis present

## 2021-02-27 DIAGNOSIS — I9589 Other hypotension: Secondary | ICD-10-CM | POA: Diagnosis present

## 2021-02-27 DIAGNOSIS — Z7901 Long term (current) use of anticoagulants: Secondary | ICD-10-CM

## 2021-02-27 DIAGNOSIS — R451 Restlessness and agitation: Secondary | ICD-10-CM | POA: Diagnosis not present

## 2021-02-27 DIAGNOSIS — J9601 Acute respiratory failure with hypoxia: Secondary | ICD-10-CM | POA: Diagnosis not present

## 2021-02-27 DIAGNOSIS — Z66 Do not resuscitate: Secondary | ICD-10-CM | POA: Diagnosis present

## 2021-02-27 DIAGNOSIS — J9621 Acute and chronic respiratory failure with hypoxia: Secondary | ICD-10-CM | POA: Diagnosis present

## 2021-02-27 DIAGNOSIS — R064 Hyperventilation: Secondary | ICD-10-CM | POA: Diagnosis not present

## 2021-02-27 DIAGNOSIS — J90 Pleural effusion, not elsewhere classified: Secondary | ICD-10-CM | POA: Diagnosis not present

## 2021-02-27 DIAGNOSIS — R531 Weakness: Secondary | ICD-10-CM | POA: Diagnosis not present

## 2021-02-27 DIAGNOSIS — I248 Other forms of acute ischemic heart disease: Secondary | ICD-10-CM | POA: Diagnosis present

## 2021-02-27 DIAGNOSIS — Z8679 Personal history of other diseases of the circulatory system: Secondary | ICD-10-CM

## 2021-02-27 DIAGNOSIS — R778 Other specified abnormalities of plasma proteins: Secondary | ICD-10-CM | POA: Diagnosis not present

## 2021-02-27 DIAGNOSIS — I4892 Unspecified atrial flutter: Secondary | ICD-10-CM | POA: Diagnosis present

## 2021-02-27 DIAGNOSIS — I4891 Unspecified atrial fibrillation: Secondary | ICD-10-CM | POA: Diagnosis not present

## 2021-02-27 DIAGNOSIS — J189 Pneumonia, unspecified organism: Secondary | ICD-10-CM | POA: Diagnosis present

## 2021-02-27 DIAGNOSIS — N1831 Chronic kidney disease, stage 3a: Secondary | ICD-10-CM | POA: Diagnosis not present

## 2021-02-27 DIAGNOSIS — I5020 Unspecified systolic (congestive) heart failure: Secondary | ICD-10-CM | POA: Diagnosis not present

## 2021-02-27 DIAGNOSIS — A419 Sepsis, unspecified organism: Principal | ICD-10-CM | POA: Diagnosis not present

## 2021-02-27 DIAGNOSIS — R7989 Other specified abnormal findings of blood chemistry: Secondary | ICD-10-CM

## 2021-02-27 DIAGNOSIS — E441 Mild protein-calorie malnutrition: Secondary | ICD-10-CM

## 2021-02-27 DIAGNOSIS — F17210 Nicotine dependence, cigarettes, uncomplicated: Secondary | ICD-10-CM | POA: Diagnosis present

## 2021-02-27 DIAGNOSIS — R579 Shock, unspecified: Secondary | ICD-10-CM | POA: Diagnosis not present

## 2021-02-27 DIAGNOSIS — D509 Iron deficiency anemia, unspecified: Secondary | ICD-10-CM | POA: Diagnosis present

## 2021-02-27 DIAGNOSIS — E785 Hyperlipidemia, unspecified: Secondary | ICD-10-CM | POA: Diagnosis present

## 2021-02-27 DIAGNOSIS — Z8249 Family history of ischemic heart disease and other diseases of the circulatory system: Secondary | ICD-10-CM

## 2021-02-27 DIAGNOSIS — J439 Emphysema, unspecified: Secondary | ICD-10-CM | POA: Diagnosis not present

## 2021-02-27 DIAGNOSIS — I959 Hypotension, unspecified: Secondary | ICD-10-CM | POA: Diagnosis present

## 2021-02-27 DIAGNOSIS — J441 Chronic obstructive pulmonary disease with (acute) exacerbation: Secondary | ICD-10-CM

## 2021-02-27 DIAGNOSIS — Z6825 Body mass index (BMI) 25.0-25.9, adult: Secondary | ICD-10-CM

## 2021-02-27 DIAGNOSIS — I5181 Takotsubo syndrome: Secondary | ICD-10-CM | POA: Diagnosis present

## 2021-02-27 DIAGNOSIS — J9811 Atelectasis: Secondary | ICD-10-CM | POA: Diagnosis not present

## 2021-02-27 DIAGNOSIS — Z95828 Presence of other vascular implants and grafts: Secondary | ICD-10-CM

## 2021-02-27 DIAGNOSIS — J9690 Respiratory failure, unspecified, unspecified whether with hypoxia or hypercapnia: Secondary | ICD-10-CM | POA: Diagnosis not present

## 2021-02-27 DIAGNOSIS — Z79899 Other long term (current) drug therapy: Secondary | ICD-10-CM

## 2021-02-27 DIAGNOSIS — Z8701 Personal history of pneumonia (recurrent): Secondary | ICD-10-CM

## 2021-02-27 LAB — COMPREHENSIVE METABOLIC PANEL
ALT: 28 U/L (ref 0–44)
AST: 24 U/L (ref 15–41)
Albumin: 3.1 g/dL — ABNORMAL LOW (ref 3.5–5.0)
Alkaline Phosphatase: 67 U/L (ref 38–126)
Anion gap: 6 (ref 5–15)
BUN: 28 mg/dL — ABNORMAL HIGH (ref 8–23)
CO2: 33 mmol/L — ABNORMAL HIGH (ref 22–32)
Calcium: 8.1 mg/dL — ABNORMAL LOW (ref 8.9–10.3)
Chloride: 100 mmol/L (ref 98–111)
Creatinine, Ser: 1.83 mg/dL — ABNORMAL HIGH (ref 0.61–1.24)
GFR, Estimated: 38 mL/min — ABNORMAL LOW (ref >60)
Glucose, Bld: 98 mg/dL (ref 70–99)
Potassium: 4.3 mmol/L (ref 3.5–5.1)
Sodium: 139 mmol/L (ref 135–145)
Total Bilirubin: 0.3 mg/dL (ref 0.3–1.2)
Total Protein: 6.2 g/dL — ABNORMAL LOW (ref 6.5–8.1)

## 2021-02-27 LAB — LACTIC ACID, PLASMA
Lactic Acid, Venous: 1.3 mmol/L (ref 0.5–1.9)
Lactic Acid, Venous: 3.7 mmol/L (ref 0.5–1.9)

## 2021-02-27 LAB — ECHOCARDIOGRAM COMPLETE
AR max vel: 2.48 cm2
AV Area VTI: 2.62 cm2
AV Area mean vel: 2.41 cm2
AV Mean grad: 3 mmHg
AV Peak grad: 5.4 mmHg
Ao pk vel: 1.16 m/s
Area-P 1/2: 3.91 cm2
Height: 67 in
MV VTI: 1.89 cm2
S' Lateral: 3.1 cm
Weight: 2652.57 oz

## 2021-02-27 LAB — TROPONIN I (HIGH SENSITIVITY)
Troponin I (High Sensitivity): 132 ng/L (ref <18)
Troponin I (High Sensitivity): 115 ng/L (ref <18)

## 2021-02-27 LAB — RESP PANEL BY RT-PCR (FLU A&B, COVID) ARPGX2
Influenza A by PCR: NEGATIVE
Influenza B by PCR: NEGATIVE
SARS Coronavirus 2 by RT PCR: NEGATIVE

## 2021-02-27 LAB — CBC WITH DIFFERENTIAL/PLATELET
Abs Immature Granulocytes: 0.09 10*3/uL — ABNORMAL HIGH (ref 0.00–0.07)
Basophils Absolute: 0 10*3/uL (ref 0.0–0.1)
Basophils Relative: 0 %
Eosinophils Absolute: 0.4 10*3/uL (ref 0.0–0.5)
Eosinophils Relative: 2 %
HCT: 28.9 % — ABNORMAL LOW (ref 39.0–52.0)
Hemoglobin: 8.5 g/dL — ABNORMAL LOW (ref 13.0–17.0)
Immature Granulocytes: 1 %
Lymphocytes Relative: 9 %
Lymphs Abs: 1.6 10*3/uL (ref 0.7–4.0)
MCH: 29.2 pg (ref 26.0–34.0)
MCHC: 29.4 g/dL — ABNORMAL LOW (ref 30.0–36.0)
MCV: 99.3 fL (ref 80.0–100.0)
Monocytes Absolute: 2.2 10*3/uL — ABNORMAL HIGH (ref 0.1–1.0)
Monocytes Relative: 12 %
Neutro Abs: 13.9 10*3/uL — ABNORMAL HIGH (ref 1.7–7.7)
Neutrophils Relative %: 76 %
Platelets: 474 10*3/uL — ABNORMAL HIGH (ref 150–400)
RBC: 2.91 MIL/uL — ABNORMAL LOW (ref 4.22–5.81)
RDW: 15.4 % (ref 11.5–15.5)
WBC: 18.1 10*3/uL — ABNORMAL HIGH (ref 4.0–10.5)
nRBC: 0 % (ref 0.0–0.2)

## 2021-02-27 LAB — BRAIN NATRIURETIC PEPTIDE: B Natriuretic Peptide: 788 pg/mL — ABNORMAL HIGH (ref 0.0–100.0)

## 2021-02-27 LAB — PROCALCITONIN: Procalcitonin: <0.1 ng/mL

## 2021-02-27 MED ORDER — ACETAMINOPHEN 650 MG RE SUPP: 650.0000 mg | Freq: Four times a day (QID) | RECTAL | Status: DC | PRN

## 2021-02-27 MED ORDER — NOREPINEPHRINE 4 MG/250ML-% IV SOLN
0.0000 ug/min | INTRAVENOUS | Status: DC
  Administered 2021-02-27: 2 ug/min via INTRAVENOUS
  Filled 2021-02-27: qty 250

## 2021-02-27 MED ORDER — ACETAMINOPHEN 325 MG PO TABS: 650.0000 mg | ORAL_TABLET | Freq: Four times a day (QID) | ORAL | Status: DC | PRN

## 2021-02-27 MED ORDER — IPRATROPIUM BROMIDE 0.02 % IN SOLN
0.5000 mg | Freq: Four times a day (QID) | RESPIRATORY_TRACT | Status: DC
  Administered 2021-02-27: 0.5 mg via RESPIRATORY_TRACT
  Filled 2021-02-27: qty 2.5

## 2021-02-27 MED ORDER — MELATONIN 3 MG PO TABS
6.0000 mg | ORAL_TABLET | Freq: Every evening | ORAL | Status: DC | PRN
  Administered 2021-02-27: 6 mg via ORAL
  Filled 2021-02-27: qty 2

## 2021-02-27 MED ORDER — APIXABAN 5 MG PO TABS
5.0000 mg | ORAL_TABLET | Freq: Two times a day (BID) | ORAL | Status: DC
  Administered 2021-02-27 – 2021-03-02 (×4): 5 mg via ORAL
  Filled 2021-02-27 (×4): qty 1

## 2021-02-27 MED ORDER — ONDANSETRON HCL 4 MG/2ML IJ SOLN: 4.0000 mg | Freq: Four times a day (QID) | INTRAMUSCULAR | Status: DC | PRN

## 2021-02-27 MED ORDER — ALBUTEROL (5 MG/ML) CONTINUOUS INHALATION SOLN: 10.0000 mg/h | INHALATION_SOLUTION | Freq: Once | RESPIRATORY_TRACT | Status: DC

## 2021-02-27 MED ORDER — IPRATROPIUM BROMIDE 0.02 % IN SOLN
0.5000 mg | Freq: Three times a day (TID) | RESPIRATORY_TRACT | Status: DC
  Administered 2021-02-28 – 2021-03-03 (×10): 0.5 mg via RESPIRATORY_TRACT
  Filled 2021-02-27 (×10): qty 2.5

## 2021-02-27 MED ORDER — LEVALBUTEROL HCL 1.25 MG/0.5ML IN NEBU
1.2500 mg | INHALATION_SOLUTION | Freq: Three times a day (TID) | RESPIRATORY_TRACT | Status: DC
  Administered 2021-02-27: 1.25 mg via RESPIRATORY_TRACT
  Filled 2021-02-27: qty 0.5

## 2021-02-27 MED ORDER — ALBUTEROL SULFATE (2.5 MG/3ML) 0.083% IN NEBU
2.5000 mg | INHALATION_SOLUTION | Freq: Four times a day (QID) | RESPIRATORY_TRACT | Status: DC | PRN
  Filled 2021-02-27: qty 3

## 2021-02-27 MED ORDER — DRONEDARONE HCL 400 MG PO TABS
400.0000 mg | ORAL_TABLET | Freq: Two times a day (BID) | ORAL | Status: DC
  Filled 2021-02-27 (×2): qty 1

## 2021-02-27 MED ORDER — FUROSEMIDE 10 MG/ML IJ SOLN
40.0000 mg | Freq: Once | INTRAMUSCULAR | Status: AC
  Administered 2021-02-27: 40 mg via INTRAVENOUS
  Filled 2021-02-27: qty 4

## 2021-02-27 MED ORDER — PERFLUTREN LIPID MICROSPHERE
1.0000 mL | INTRAVENOUS | Status: AC | PRN
  Administered 2021-02-27: 2 mL via INTRAVENOUS

## 2021-02-27 MED ORDER — MIDODRINE HCL 5 MG PO TABS
10.0000 mg | ORAL_TABLET | Freq: Three times a day (TID) | ORAL | Status: DC
  Administered 2021-02-27: 10 mg via ORAL
  Filled 2021-02-27: qty 2

## 2021-02-27 MED ORDER — DIGOXIN 0.25 MG/ML IJ SOLN
0.2500 mg | Freq: Four times a day (QID) | INTRAMUSCULAR | Status: AC
  Administered 2021-02-27 (×3): 0.25 mg via INTRAVENOUS
  Filled 2021-02-27 (×3): qty 2

## 2021-02-27 MED ORDER — METHYLPREDNISOLONE SODIUM SUCC 125 MG IJ SOLR
125.0000 mg | Freq: Once | INTRAMUSCULAR | Status: AC
  Administered 2021-02-27: 125 mg via INTRAVENOUS
  Filled 2021-02-27: qty 2

## 2021-02-27 MED ORDER — OXYCODONE HCL 5 MG PO TABS: 5.0000 mg | ORAL_TABLET | ORAL | Status: DC | PRN

## 2021-02-27 MED ORDER — CHLORHEXIDINE GLUCONATE CLOTH 2 % EX PADS
6.0000 | MEDICATED_PAD | Freq: Every day | CUTANEOUS | Status: DC
  Administered 2021-02-27 – 2021-03-03 (×5): 6 via TOPICAL

## 2021-02-27 MED ORDER — FUROSEMIDE 10 MG/ML IJ SOLN
40.0000 mg | Freq: Two times a day (BID) | INTRAMUSCULAR | Status: DC
  Administered 2021-02-27 (×2): 40 mg via INTRAVENOUS
  Filled 2021-02-27 (×2): qty 4

## 2021-02-27 MED ORDER — ALBUMIN HUMAN 25 % IV SOLN: 25.0000 g | Freq: Two times a day (BID) | INTRAVENOUS | Status: DC

## 2021-02-27 MED ORDER — ONDANSETRON HCL 4 MG PO TABS: 4.0000 mg | ORAL_TABLET | Freq: Four times a day (QID) | ORAL | Status: DC | PRN

## 2021-02-27 MED ORDER — ALBUTEROL SULFATE (2.5 MG/3ML) 0.083% IN NEBU
INHALATION_SOLUTION | RESPIRATORY_TRACT | Status: AC
  Administered 2021-02-27: 10 mg via RESPIRATORY_TRACT
  Filled 2021-02-27: qty 12

## 2021-02-27 MED ORDER — ROSUVASTATIN CALCIUM 5 MG PO TABS
5.0000 mg | ORAL_TABLET | Freq: Every day | ORAL | Status: DC
  Administered 2021-02-27 – 2021-03-02 (×2): 5 mg via ORAL
  Filled 2021-02-27 (×2): qty 1

## 2021-02-27 MED ORDER — BUDESONIDE 0.5 MG/2ML IN SUSP
0.5000 mg | Freq: Two times a day (BID) | RESPIRATORY_TRACT | Status: DC
  Administered 2021-02-27 – 2021-03-03 (×9): 0.5 mg via RESPIRATORY_TRACT
  Filled 2021-02-27 (×10): qty 2

## 2021-02-27 MED ORDER — LEVALBUTEROL HCL 1.25 MG/0.5ML IN NEBU
1.2500 mg | INHALATION_SOLUTION | Freq: Three times a day (TID) | RESPIRATORY_TRACT | Status: DC
  Administered 2021-02-27 – 2021-03-03 (×11): 1.25 mg via RESPIRATORY_TRACT
  Filled 2021-02-27 (×11): qty 0.5

## 2021-02-27 MED ORDER — FUROSEMIDE 10 MG/ML IJ SOLN: 40.0000 mg | Freq: Every day | INTRAMUSCULAR | Status: DC

## 2021-02-27 MED ORDER — ALBUMIN HUMAN 25 % IV SOLN
25.0000 g | Freq: Three times a day (TID) | INTRAVENOUS | Status: AC
  Administered 2021-02-27 – 2021-02-28 (×4): 25 g via INTRAVENOUS
  Filled 2021-02-27 (×8): qty 100

## 2021-02-27 MED ORDER — ALBUTEROL SULFATE (2.5 MG/3ML) 0.083% IN NEBU: 10.0000 mg | INHALATION_SOLUTION | Freq: Once | RESPIRATORY_TRACT | Status: AC

## 2021-02-27 NOTE — Progress Notes (Signed)
*  PRELIMINARY RESULTS* Echocardiogram 2D Echocardiogram has been performed.  Blake Cherry 03/08/2021, 2:11 PM

## 2021-02-27 NOTE — ED Provider Notes (Addendum)
[facility]   CSN: [id number] Arrival date & time: 03/04/2021  [id number]     History  Chief Complaint  Patient presents with   Shortness of Breath    Blake Cherry is a 76 y.o. male.  Patient presents to the emergency department for evaluation of shortness of breath.  Symptoms present for 1 day.  Patient reports he has had increased cough.  He also notices increased swelling of his legs.  He was recently started on Lasix for this.  Patient is on oxygen at home.  He has used albuterol with only minimal improvement.  He has not had any fever.  Denies chest pain.      Home Medications Prior to Admission medications   Medication Sig Start Date End Date Taking? Authorizing Provider  albuterol (PROVENTIL) (2.5 MG/3ML) 0.083% nebulizer solution USE 1 VIAL IN NEBULIZER EVERY 4 HOURS AS NEEDED FOR WHEEZING OR SHORTNESS OF BREATH 02/07/20   Babs SciaraLuking, Scott A, MD  apixaban (ELIQUIS) 5 MG TABS tablet Take 1 tablet (5 mg total) by mouth 2 (two) times daily. 01/30/21   Jonelle SidleMcDowell, Samuel G, MD  Budeson-Glycopyrrol-Formoterol (BREZTRI AEROSPHERE) 160-9-4.8 MCG/ACT AERO Inhale 2 puffs into the lungs in the morning and at bedtime. Patient not taking: Reported on 02/11/2021 07/24/20   Babs SciaraLuking, Scott A, MD  budesonide (PULMICORT) 0.5 MG/2ML nebulizer solution USE ONE VIAL IN NEBULIZER TWICE DAILY 07/24/20   Babs SciaraLuking, Scott A, MD  diltiazem (CARDIZEM CD) 180 MG 24 hr capsule Take 1 capsule (180 mg total) by mouth daily. 01/30/21   Jonelle SidleMcDowell, Samuel G, MD  dronedarone (MULTAQ) 400 MG tablet Take 1 tablet (400 mg total) by mouth 2 (two) times daily with a meal. 01/30/21   Jonelle SidleMcDowell, Samuel G, MD  ferrous sulfate 325 (65 FE) MG tablet Take 1 tablet (325 mg total) by mouth 2 (two) times daily with a meal. 02/19/21 02/19/22  Sherryll BurgerShah, Pratik D, DO  folic acid (FOLVITE) 1 MG tablet Take 1 tablet (1 mg total) by mouth daily. 02/20/21 03/22/21  Sherryll BurgerShah, Pratik D, DO  furosemide (LASIX) 20 MG tablet Take  1 tablet (20 mg total) by mouth daily as needed for fluid or edema. 02/19/21   Sherryll BurgerShah, Pratik D, DO  guaiFENesin (MUCINEX) 600 MG 12 hr tablet Take 1 tablet (600 mg total) by mouth 2 (two) times daily. Patient taking differently: Take 600 mg by mouth as needed. 09/19/14   Erick BlinksMemon, Jehanzeb, MD  mometasone (ELOCON) 0.1 % cream Apply BID PRN Patient not taking: Reported on 02/11/2021 09/26/19   Babs SciaraLuking, Scott A, MD  PROAIR HFA 108 [phone](90 Base) MCG/ACT inhaler INHALE 2 PUFFS BY MOUTH EVERY 6 HOURS AS NEEDED FOR WHEEZING FOR SHORTNESS OF BREATH 07/19/20   Babs SciaraLuking, Scott A, MD  rosuvastatin (CRESTOR) 5 MG tablet Take 1 tablet (5 mg total) by mouth daily. 07/24/20   Babs SciaraLuking, Scott A, MD  vitamin B-12 1000 MCG tablet Take 1 tablet (1,000 mcg total) by mouth daily. 02/20/21 03/22/21  Maurilio LovelyShah, Pratik D, DO      Allergies    Patient has no known allergies.    Review of Systems   Review of Systems  Physical Exam Updated Vital Signs BP (!) 97/50    Pulse 80    Temp 97.7 F (36.5 C)    Resp (!) 25    Ht 5\' 7"  (1.702 m)    Wt 74.8 kg    SpO2 100%    BMI 25.84 kg/m  Physical Exam Vitals and  nursing note reviewed.  Constitutional:      General: He is not in acute distress.    Appearance: Normal appearance. He is well-developed.  HENT:     Head: Normocephalic and atraumatic.     Right Ear: Hearing normal.     Left Ear: Hearing normal.     Nose: Nose normal.  Eyes:     Conjunctiva/sclera: Conjunctivae normal.     Pupils: Pupils are equal, round, and reactive to light.  Cardiovascular:     Rate and Rhythm: Regular rhythm.     Heart sounds: S1 normal and S2 normal. No murmur heard.   No friction rub. No gallop.  Pulmonary:     Effort: Tachypnea and accessory muscle usage present.     Breath sounds: Examination of the left-lower field reveals decreased breath sounds. Decreased breath sounds and rales present.     Comments: Pursed lip breathing Chest:     Chest wall: No tenderness.  Abdominal:     General: Bowel  sounds are normal.     Palpations: Abdomen is soft.     Tenderness: There is no abdominal tenderness. There is no guarding or rebound. Negative signs include Murphy's sign and McBurney's sign.     Hernia: No hernia is present.  Musculoskeletal:        General: Normal range of motion.     Cervical back: Normal range of motion and neck supple.     Right lower leg: Edema present.     Left lower leg: Edema present.  Skin:    General: Skin is warm and dry.     Findings: No rash.  Neurological:     Mental Status: He is alert and oriented to person, place, and time.     GCS: GCS eye subscore is 4. GCS verbal subscore is 5. GCS motor subscore is 6.     Cranial Nerves: No cranial nerve deficit.     Sensory: No sensory deficit.     Coordination: Coordination normal.  Psychiatric:        Speech: Speech normal.        Behavior: Behavior normal.        Thought Content: Thought content normal.    ED Results / Procedures / Treatments   Labs (all labs ordered are listed, but only abnormal results are displayed) Labs Reviewed  CBC WITH DIFFERENTIAL/PLATELET - Abnormal; Notable for the following components:      Result Value   WBC 18.1 (*)    RBC 2.91 (*)    Hemoglobin 8.5 (*)    HCT 28.9 (*)    MCHC 29.4 (*)    Platelets 474 (*)    Neutro Abs 13.9 (*)    Monocytes Absolute 2.2 (*)    Abs Immature Granulocytes 0.09 (*)    All other components within normal limits  COMPREHENSIVE METABOLIC PANEL - Abnormal; Notable for the following components:   CO2 33 (*)    BUN 28 (*)    Creatinine, Ser 1.83 (*)    Calcium 8.1 (*)    Total Protein 6.2 (*)    Albumin 3.1 (*)    GFR, Estimated 38 (*)    All other components within normal limits  BRAIN NATRIURETIC PEPTIDE - Abnormal; Notable for the following components:   B Natriuretic Peptide 788.0 (*)    All other components within normal limits  TROPONIN I (HIGH SENSITIVITY) - Abnormal; Notable for the following components:   Troponin I (High  Sensitivity) 132 (*)  All other components within normal limits  RESP PANEL BY RT-PCR (FLU A&B, COVID) [id number]  LACTIC ACID, PLASMA    EKG EKG Interpretation  Date/Time:  Thursday 2021-03-10 03:33:49 EST Ventricular Rate:  102 PR Interval:    QRS Duration: 86 QT Interval:  364 QTC Calculation: 475 R Axis:   -10 Text Interpretation: Atrial fibrillation Anterior infarct, old Confirmed by Gilda Crease [phone]) on 03/10/21 3:40:59 AM  Radiology No results found.  Procedures Procedures    Medications Ordered in ED Medications - No data to display  ED Course/ Medical Decision Making/ A&P                           Medical Decision Making  Patient with history of tach, hyperlipidemia, AAA, COPD, chronic renal insufficiency, SVT, atrial flutter with chronic anticoagulation, presents to the emergency department for evaluation of shortness of breath.  Patient reports 1 day of worsening shortness of breath with cough.  No fever.  Differential diagnosis includes COPD exacerbation, CHF, ACS, COVID, influenza, viral URI, pneumonia.  Chest x-ray shows persistent right upper lobe infiltrate and what appears to be increased loculated left pleural effusion.  Images reviewed and interpreted independently by myself.  Patient is in atrial fibrillation on cardiac monitor.  EKG confirms atrial fibrillation with slightly rapid ventricular rate, no ischemia or infarct.  Patient with elevated BNP.  Suspect that shortness of breath is multifactorial including his known COPD, pleural effusion, possible heart failure.  Patient did have an echo in February 2022 that showed ejection fraction of 60 to 65% but diastolic parameters are indeterminate because of atrial fibrillation.  Troponin is elevated.  This is consistent with troponin leak from his respiratory symptoms were possibly heart strain from heart failure.  Not consistent with acute coronary syndrome.  Patient's blood pressures  are low but he reports that this is normal for him.  This was confirmed by reviewing most recent hospitalization, blood pressures running 80-90 systolic regularly.  Patient has prescription for Lasix and tells me he used to use it as needed for his leg swelling.  He has taken for the last couple of days but has not improved.  We will give IV diuresis and treat for COPD as well.  Admit to hospital for further management.  CRITICAL CARE Performed by: Gilda Crease   Total critical care time: 34 minutes  Critical care time was exclusive of separately billable procedures and treating other patients.  Critical care was necessary to treat or prevent imminent or life-threatening deterioration.  Critical care was time spent personally by me on the following activities: development of treatment plan with patient and/or surrogate as well as nursing, discussions with consultants, evaluation of patient's response to treatment, examination of patient, obtaining history from patient or surrogate, ordering and performing treatments and interventions, ordering and review of laboratory studies, ordering and review of radiographic studies, pulse oximetry and re-evaluation of patient's condition.         Final Clinical Impression(s) / ED Diagnoses Final diagnoses:  COPD exacerbation (HCC)  Pleural effusion    Rx / DC Orders ED Discharge Orders     None         Treena Cosman, Canary Brim, MD 03-10-21 [id number]    Gilda Crease, MD 03/10/21 [phone]

## 2021-02-27 NOTE — Progress Notes (Signed)
With systolic dysfunction by echo would d/c midodrine and continue peripheral levophed. May need central line in the AM if ongoing pressor requirement, likely won't qualify for a PICC with his renal function.   Dominga Ferry MD

## 2021-02-27 NOTE — Progress Notes (Signed)
Pt arrived to room 203. A/o. Nad at this time.

## 2021-02-27 NOTE — Plan of Care (Signed)
  Problem: Education: Goal: Ability to demonstrate management of disease process will improve Outcome: Progressing Goal: Ability to verbalize understanding of medication therapies will improve Outcome: Progressing   

## 2021-02-27 NOTE — Progress Notes (Signed)
Pt bp x2 70s systolic, hr with a fib ranging from 90-140s. HOspitalist called to verify if pt needs to be in stepdown

## 2021-02-27 NOTE — ED Triage Notes (Signed)
Pt c/o sob since yesterday.  

## 2021-02-27 NOTE — Progress Notes (Signed)
°  Transition of Care [facility]) Screening Note   Patient Details  Name: Blake Cherry Date of Birth: Jun 11, 1945   Transition of Care [facility] CM/SW Contact:    Elliot Gault, LCSW Phone Number: 2021/03/01, 11:34 AM    Transition of Care Department [facility]) has reviewed patient and no TOC needs have been identified at this time. We will continue to monitor patient advancement through interdisciplinary progression rounds. If new patient transition needs arise, please place a TOC consult.  Received TOC consult for CHF screen. Reviewed pt's record and did not see any CHF diagnosis. Discussed with attending who states that pt most likely has CHF but it is not yet a confirmed diagnosis. Will clear the CHF screen at this time. MD aware. TOC can be re-consulted if CHF diagnosis is confirmed and explained to patient by MD.

## 2021-02-27 NOTE — Consult Note (Addendum)
Cardiology Consultation:   Patient ID: [name] MRN: [id number]; DOB: 11-30-1945  Admit date: 03/11/2021 Date of Consult: 03/14/2021  PCP:  Kathyrn Drown, MD   [facility] Providers Cardiologist:  Rozann Lesches, MD        Patient Profile:   Blake Cherry is a 76 y.o. male with a hx of paroxysmal afib/aflutter who is being seen 03/06/2021 for the evaluation of tachycardia at the request of Dr Roger Shelter.  History of Present Illness:   [name] 76 yo male history of AAA s/p endovascular repair, afib/aflutter, carotid stenosis s/p left CEA, COPD, prior CVA. During 01/30/21 appt with Dr Domenic Polite noted to be in atypical aflutter vs ectopic atach, similar to rhythm during Dec 6 ER visit. Started on multaq 400mg  bid, digoxin was stopped and dilt lowered to 180mg  daily with plans for afib clinic evaluation if persistent arrhythmias.   Recent admit and discharge 02/19/21 with pnuemoina, anemia, COPD exacerbation. Eliquis was temporarily held at that time for anemia, low 123456 and folic acid supplemented, received 1 unit pRBCs. Plans for outpatient EGD/colonscopy when respiratory status improves. Was discharged back on eliquis  Patient presents with palpitaitons,SOB and cough, LE edema which is new for him. Recently started on lasix as outpatient.   Admit labs WBC 18.1 Hgb 8.5 (up from 7.6 1 week ago) Plt 474 K 4.3 Cr 1.83 (baseline 1.3 to 1.7)  BUN 28 Lactic acid 1.3 BNP 788  Trop 132-->115--> COVID neg flu neg CXR chronic loculated left pleural effusion EKG afib rate 102  03/2020 echo LVEF 123456, indet diastolic fxn, normal RV function, mod LAE      Past Medical History:  Diagnosis Date   AAA (abdominal aortic aneurysm)    Endovascular stent repair 2014   Atrial fibrillation and flutter (HCC)    Carotid artery disease ([place]) 2007   Left CEA   CKD (chronic kidney disease), stage III (HCC)    COPD (chronic obstructive pulmonary disease) ([place])    Diverticulitis     Essential hypertension    Stroke ([place]) 2007   SVT (supraventricular tachycardia) ([place])     Past Surgical History:  Procedure Laterality Date   ABDOMINAL AORTAGRAM N/A 03/10/2013   Procedure: ABDOMINAL Maxcine Ham;  Surgeon: Elam Dutch, MD;  Location: [facility] CATH LAB;  Service: Cardiovascular;  Laterality: N/A;   ABDOMINAL AORTIC ENDOVASCULAR STENT GRAFT N/A 08/22/2012   Procedure: ABDOMINAL AORTIC ENDOVASCULAR STENT GRAFT;  Surgeon: Elam Dutch, MD;  Location: [facility];  Service: Vascular;  Laterality: N/A;   CAROTID ENDARTERECTOMY Left 2007   CAROTID STENT  2007   left internal carotid artery   TESTICLE TORSION REDUCTION     age of 66      Inpatient Medications: Scheduled Meds:  apixaban  5 mg Oral BID   budesonide  0.5 mg Nebulization BID   dronedarone  400 mg Oral BID WC   furosemide  40 mg Intravenous Q12H   rosuvastatin  5 mg Oral Daily   Continuous Infusions:  albumin human 25 g (03/19/2021 0901)   PRN Meds: acetaminophen **OR** acetaminophen, albuterol, ondansetron **OR** ondansetron (ZOFRAN) IV, oxyCODONE  Allergies:   No Known Allergies  Social History:   Social History   Socioeconomic History   Marital status: Married    Spouse name: Not on file   Number of children: Not on file   Years of education: Not on file   Highest education level: Not on file  Occupational History   Not on file  Tobacco Use   Smoking status: Every Day    Packs/day: 0.50    Years: 60.00    Pack years: 30.00    Types: Cigarettes   Smokeless tobacco: Former    Types: Snuff   Tobacco comments:    4-5 cigs per day 08/16/20//lmr  Vaping Use   Vaping Use: Never used  Substance and Sexual Activity   Alcohol use: No    Alcohol/week: 0.0 standard drinks   Drug use: No   Sexual activity: Not on file  Other Topics Concern   Not on file  Social History Narrative   Not on file   Social Determinants of Health   Financial Resource Strain: Low Risk    Difficulty of Paying Living  Expenses: Not hard at all  Food Insecurity: No Food Insecurity   Worried About Charity fundraiser in the Last Year: Never true   Ran Out of Food in the Last Year: Never true  Transportation Needs: No Transportation Needs   Lack of Transportation (Medical): No   Lack of Transportation (Non-Medical): No  Physical Activity: Inactive   Days of Exercise per Week: 0 days   Minutes of Exercise per Session: 0 min  Stress: No Stress Concern Present   Feeling of Stress : Not at all  Social Connections: Moderately Integrated   Frequency of Communication with Friends and Family: More than three times a week   Frequency of Social Gatherings with Friends and Family: More than three times a week   Attends Religious Services: More than 4 times per year   Active Member of [employer] or Organizations: No   Attends Music therapist: Never   Marital Status: Married  Human resources officer Violence: Not At Risk   Fear of Current or Ex-Partner: No   Emotionally Abused: No   Physically Abused: No   Sexually Abused: No    Family History:    Family History  Problem Relation Age of Onset   Cancer Mother        Breast cancer   Hypertension Mother    Diabetes Mother    Diabetes Father      ROS:  Please see the history of present illness.   All other ROS reviewed and negative.     Physical Exam/Data:   Vitals:   03/20/2021 0845 02/26/2021 0851 03/22/2021 0959 02/26/2021 1018  BP:   (!) 85/57   Pulse:   (!) 106   Resp: (!) 26     Temp:   97.7 F (36.5 C)   TempSrc:      SpO2:  94% 90% 94%  Weight:   75.2 kg   Height:   5\' 7"  (1.702 m)     Intake/Output Summary (Last 24 hours) at 03/02/2021 1043 Last data filed at 03/24/2021 0958 Gross per 24 hour  Intake --  Output 150 ml  Net -150 ml   Last 3 Weights 03/06/2021 03/19/2021 02/11/2021  Weight (lbs) 165 lb 12.6 oz 165 lb 163 lb  Weight (kg) 75.2 kg 74.844 kg 73.936 kg     Body mass index is 25.97 kg/m.  General:  Well nourished, well  developed, in no acute distress HEENT: normal Neck: no JVD Vascular: No carotid bruits; Distal pulses 2+ bilaterally Cardiac:  irreg,tachy Lungs:  clear to auscultation bilaterally, no wheezing, rhonchi or rales  Abd: soft, nontender, no hepatomegaly  Ext: 2+ bilateral LE edema Musculoskeletal:  No deformities, BUE and BLE strength normal and equal Skin: warm and dry  Neuro:  CNs 2-12 intact, no focal abnormalities noted Psych:  Normal affect     Laboratory Data:  High Sensitivity Troponin:   Recent Labs  Lab 03/14/2021 0340 03/09/2021 0523  TROPONINIHS 132* 115*     Chemistry Recent Labs  Lab 03/09/2021 0340  NA 139  K 4.3  CL 100  CO2 33*  GLUCOSE 98  BUN 28*  CREATININE 1.83*  CALCIUM 8.1*  GFRNONAA 38*  ANIONGAP 6    Recent Labs  Lab 03/17/2021 0340  PROT 6.2*  ALBUMIN 3.1*  AST 24  ALT 28  ALKPHOS 67  BILITOT 0.3   Lipids No results for input(s): CHOL, TRIG, HDL, LABVLDL, LDLCALC, CHOLHDL in the last 168 hours.  Hematology Recent Labs  Lab 03/02/2021 0340  WBC 18.1*  RBC 2.91*  HGB 8.5*  HCT 28.9*  MCV 99.3  MCH 29.2  MCHC 29.4*  RDW 15.4  PLT 474*   Thyroid No results for input(s): TSH, FREET4 in the last 168 hours.  BNP Recent Labs  Lab 03/23/2021 0340  BNP 788.0*    DDimer No results for input(s): DDIMER in the last 168 hours.   Radiology/Studies:  [place] CHEST (PLEURAL EFFUSION)  Result Date: 03/16/2021 CLINICAL DATA:  Pleural effusion, assessment for thoracentesis EXAM: CHEST ULTRASOUND COMPARISON:  Chest radiograph 02/28/2020 FINDINGS: Small loculated LEFT pleural effusion identified. Volume of fluid is insufficient for thoracentesis. IMPRESSION: Small loculated LEFT pleural effusion, insufficient for thoracentesis. Electronically Signed   By: Lavonia Dana M.D.   On: 03/08/2021 10:38   DG Chest Port 1 View  Result Date: 03/05/2021 CLINICAL DATA:  76 year old male with shortness of breath. EXAM: PORTABLE CHEST 1 VIEW COMPARISON:  Portable chest  02/16/2021 and earlier. FINDINGS: Portable AP upright view at 0349 hours. Chronic loculated left pleural effusion with associated areas of left lung round atelectasis. Right upper lobe consolidation is new since 02/11/2021, but ongoing from the end of last month and has not significantly improved. Stable cardiac size and mediastinal contours. Stable trachea. No superimposed pneumothorax or pulmonary edema. No acute osseous abnormality identified. IMPRESSION: 1. Right upper lobe consolidation first present on 02/13/2021 has not resolved. 2. Chronic loculated left pleural effusion with left lung atelectasis. 3. No new cardiopulmonary abnormality. Electronically Signed   By: Genevie Ann M.D.   On: 02/24/2021 04:04     Assessment and Plan:   1.Paroxysmal afib/flutter -  - 01/30/21 visit with Dr Domenic Polite noted to be in atypical aflutter vs ectopic atach, similar to rhythm during Dec 6 ER visit. Started on multaq 400mg  bid, digoxin was stopped and dilt lowered to 180mg  daily with plans for afib clinic evaluation if persistent arrhythmias.  - rate control overall has been limited due to soft bp's  - recurrent afib on multaq. With new signs of fluid overload would d/c multaq. Antiarrhythmic therapy difficult due to clinical HF, CKD, COPD. Rate control limited by low bp's. We may ultimately have to start midodrine to keep bp's up to allow av nodal agent if echo does not show any LV dysfunction.  - for d/c multaq, continue to hold diltiazem for low bp's, will dose IV digoxin 0.25mg  IV q 6 hrs x 3 doses for now.     2. LE edema - elevated BNP with LE edema, CXR without significant edema - repeat echo pending. 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   3.COPD exacerbation - chronic COPD on 4L [place] at home - per primary team  4. Chronic pleural effusion - chronic, [place] this admission small locluated too small for thoracentesis.   5. ELevated troponin - mild and downtrending, no  specific ischemic changes on EKG - f/u echo - at this time suspect demand ischemia  6. Chronic hypotension - prior visits [facility]Ps in 90s on diltiazem, not a new finding -depending on echo may consider starting midodrine to allow higher dosing of av nodal agent    For questions or updates, please contact [facility] Please consult [email or web address] for contact info under    Signed, Carlyle Dolly, MD  03/17/2021 10:43 AM

## 2021-02-27 NOTE — Plan of Care (Signed)

## 2021-02-27 NOTE — H&P (Signed)
TRH H&P    Patient Demographics:    Blake Cherry, is a 76 y.o. male  MRN: [id number]  DOB - 1945/03/02  Admit Date - 03/10/2021  Referring MD/NP/PA: Betsey Holiday  Outpatient Primary MD for the patient is Kathyrn Drown, MD  Patient coming from: Home  Chief complaint- Dyspnea   HPI:    Blake Cherry  is a 76 y.o. male, with history of abdominal aortic aneurysm, atrial fibrillation flutter, coronary artery disease, CKD, COPD, essential hypertension, stroke, SVT, and more presents to ED with chief complaint of dyspnea.  Patient reports he has had dyspnea all day on the day of presentation.  Its been worsening since the morning when it started.  He feels like he cannot get any air in.  It is worse with exertion.  4 L nasal cannula at home.  He is maintaining his oxygen saturations on 4 L nasal cannula here in the hospital.  He has had associated cough that is productive of yellow sputum.  He denies any fevers.  He has had associated leg swelling and orthopnea.  Leg swelling started when he was discharged from his last hospital stay.  He was started on Lasix 20 mg p.o. daily about 6 days ago.  He has been taking that but has not been helping with his swelling legs.  He reports good urine output.  He reports he is only drinking 4 glasses of water a day.  He is using his breathing treatments at home and they are helping with the wheezing and the symptoms.  Patient denies any chest pain.  Patient has no other complaints at this time.  Patient does not smoke, does not drink, does not use illicit drugs.  He is vaccinated for COVID.  Patient is DO NOT INTUBATE.    Review of systems:    In addition to the HPI above,  No Fever-chills, No Headache, No changes with Vision or hearing, No problems swallowing food or Liquids, No Chest pain, admits to cough and dyspnea No Abdominal pain, No Nausea or Vomiting, bowel movements are  regular, No Blood in stool or Urine, No dysuria, No new skin rashes or bruises, No new joints pains-aches,  No new weakness, tingling, numbness in any extremity, No recent weight gain or loss, No polyuria, polydypsia or polyphagia, No significant Mental Stressors.  All other systems reviewed and are negative.    Past History of the following :    Past Medical History:  Diagnosis Date   AAA (abdominal aortic aneurysm)    Endovascular stent repair 2014   Atrial fibrillation and flutter ([place])    Carotid artery disease ([place]) 2007   Left CEA   CKD (chronic kidney disease), stage III ([place])    COPD (chronic obstructive pulmonary disease) ([place])    Diverticulitis    Essential hypertension    Stroke ([place]) 2007   SVT (supraventricular tachycardia) ([place])       Past Surgical History:  Procedure Laterality Date   ABDOMINAL AORTAGRAM N/A 03/10/2013   Procedure: ABDOMINAL Maxcine Ham;  Surgeon: Jessy Oto  Fields, MD;  Location: [facility];  Service: Cardiovascular;  Laterality: N/A;   ABDOMINAL AORTIC ENDOVASCULAR STENT GRAFT N/A 08/22/2012   Procedure: ABDOMINAL AORTIC ENDOVASCULAR STENT GRAFT;  Surgeon: Elam Dutch, MD;  Location: [facility];  Service: Vascular;  Laterality: N/A;   CAROTID ENDARTERECTOMY Left 2007   CAROTID STENT  2007   left internal carotid artery   TESTICLE TORSION REDUCTION     age of 84      Social History:      Social History   Tobacco Use   Smoking status: Every Day    Packs/day: 0.50    Years: 60.00    Pack years: 30.00    Types: Cigarettes   Smokeless tobacco: Former    Types: Snuff   Tobacco comments:    4-5 cigs per day 08/16/20//lmr  Substance Use Topics   Alcohol use: No    Alcohol/week: 0.0 standard drinks       Family History :     Family History  Problem Relation Age of Onset   Cancer Mother        Breast cancer   Hypertension Mother    Diabetes Mother    Diabetes Father       Home Medications:   Prior to Admission  medications   Medication Sig Start Date End Date Taking? Authorizing Provider  albuterol (PROVENTIL) (2.5 MG/3ML) 0.083% nebulizer solution USE 1 VIAL IN NEBULIZER EVERY 4 HOURS AS NEEDED FOR WHEEZING OR SHORTNESS OF BREATH 02/07/20   Kathyrn Drown, MD  apixaban (ELIQUIS) 5 MG TABS tablet Take 1 tablet (5 mg total) by mouth 2 (two) times daily. 01/30/21   Satira Sark, MD  Budeson-Glycopyrrol-Formoterol (BREZTRI AEROSPHERE) 160-9-4.8 MCG/ACT AERO Inhale 2 puffs into the lungs in the morning and at bedtime. Patient not taking: Reported on 02/11/2021 07/24/20   Kathyrn Drown, MD  budesonide (PULMICORT) 0.5 MG/2ML nebulizer solution USE ONE VIAL IN NEBULIZER TWICE DAILY 07/24/20   Kathyrn Drown, MD  diltiazem (CARDIZEM CD) 180 MG 24 hr capsule Take 1 capsule (180 mg total) by mouth daily. 01/30/21   Satira Sark, MD  dronedarone (MULTAQ) 400 MG tablet Take 1 tablet (400 mg total) by mouth 2 (two) times daily with a meal. 01/30/21   Satira Sark, MD  ferrous sulfate 325 (65 FE) MG tablet Take 1 tablet (325 mg total) by mouth 2 (two) times daily with a meal. 02/19/21 02/19/22  Manuella Ghazi, Pratik D, DO  folic acid (FOLVITE) 1 MG tablet Take 1 tablet (1 mg total) by mouth daily. 02/20/21 03/22/21  Manuella Ghazi, Pratik D, DO  furosemide (LASIX) 20 MG tablet Take 1 tablet (20 mg total) by mouth daily as needed for fluid or edema. 02/19/21   Manuella Ghazi, Pratik D, DO  guaiFENesin (MUCINEX) 600 MG 12 hr tablet Take 1 tablet (600 mg total) by mouth 2 (two) times daily. Patient taking differently: Take 600 mg by mouth as needed. 09/19/14   Kathie Dike, MD  mometasone (ELOCON) 0.1 % cream Apply BID PRN Patient not taking: Reported on 02/11/2021 09/26/19   Kathyrn Drown, MD  PROAIR HFA 108 [phone] Base) MCG/ACT inhaler INHALE 2 PUFFS BY MOUTH EVERY 6 HOURS AS NEEDED FOR WHEEZING FOR SHORTNESS OF BREATH 07/19/20   Kathyrn Drown, MD  rosuvastatin (CRESTOR) 5 MG tablet Take 1 tablet (5 mg total) by mouth daily. 07/24/20    Kathyrn Drown, MD  vitamin B-12 1000 MCG tablet Take 1 tablet (1,000 mcg  total) by mouth daily. 02/20/21 03/22/21  Manuella Ghazi, Pratik D, DO     Allergies:    No Known Allergies   Physical Exam:   Vitals  Blood pressure (!) 116/48, pulse (!) 44, temperature 97.7 F (36.5 C), resp. rate (!) 21, height 5\' 7"  (1.702 m), weight 74.8 kg, SpO2 97 %.   1.  General: Patient lying supine in bed,  no acute distress   2. Psychiatric: Alert and oriented x 3, mood and behavior normal for situation, pleasant and cooperative with exam   3. Neurologic: Speech and language are normal, face is symmetric, moves all 4 extremities voluntarily, at baseline without acute deficits on limited exam   4. HEENMT:  Head is atraumatic, normocephalic, pupils reactive to light, neck is supple, trachea is midline, mucous membranes are moist   5. Respiratory : Left lung field is diminished, no wheezes, crackles, rhonchi,  increase in work of breathing present   6. Cardiovascular : Heart rate normal, rhythm is regular, no murmurs, rubs or gallops, peripheral pulses palpated   7. Gastrointestinal:  Abdomen is soft, nondistended, nontender to palpation bowel sounds active, no masses or organomegaly palpated   8. Skin:  Skin is warm, dry and intact without rashes, acute lesions, or ulcers on limited exam   9.Musculoskeletal:  No acute deformities or trauma, no asymmetry in tone, peripheral edema present, peripheral pulses palpated, no tenderness to palpation in the extremities     Data Review:    CBC Recent Labs  Lab 03/21/2021 0340  WBC 18.1*  HGB 8.5*  HCT 28.9*  PLT 474*  MCV 99.3  MCH 29.2  MCHC 29.4*  RDW 15.4  LYMPHSABS 1.6  MONOABS 2.2*  EOSABS 0.4  BASOSABS 0.0   ------------------------------------------------------------------------------------------------------------------  Results for orders placed or performed during the hospital encounter of 03/17/2021 (from the past 48 hour(s))  CBC  with Differential/Platelet     Status: Abnormal   Collection Time: 03/25/2021  3:40 AM  Result Value Ref Range   WBC 18.1 (H) 4.0 - 10.5 K/uL   RBC 2.91 (L) 4.22 - 5.81 MIL/uL   Hemoglobin 8.5 (L) 13.0 - 17.0 g/dL   HCT 28.9 (L) 39.0 - 52.0 %   MCV 99.3 80.0 - 100.0 fL   MCH 29.2 26.0 - 34.0 pg   MCHC 29.4 (L) 30.0 - 36.0 g/dL   RDW 15.4 11.5 - 15.5 %   Platelets 474 (H) 150 - 400 K/uL   nRBC 0.0 0.0 - 0.2 %   Neutrophils Relative % 76 %   Neutro Abs 13.9 (H) 1.7 - 7.7 K/uL   Lymphocytes Relative 9 %   Lymphs Abs 1.6 0.7 - 4.0 K/uL   Monocytes Relative 12 %   Monocytes Absolute 2.2 (H) 0.1 - 1.0 K/uL   Eosinophils Relative 2 %   Eosinophils Absolute 0.4 0.0 - 0.5 K/uL   Basophils Relative 0 %   Basophils Absolute 0.0 0.0 - 0.1 K/uL   Immature Granulocytes 1 %   Abs Immature Granulocytes 0.09 (H) 0.00 - 0.07 K/uL    Comment: Performed at [facility], [address] [place], [place] [address]  Comprehensive metabolic panel     Status: Abnormal   Collection Time: 03/02/2021  3:40 AM  Result Value Ref Range   Sodium 139 135 - 145 mmol/L   Potassium 4.3 3.5 - 5.1 mmol/L   Chloride 100 98 - 111 mmol/L   CO2 33 (H) 22 - 32 mmol/L   Glucose, Bld 98 70 -  99 mg/dL    Comment: Glucose reference range applies only to samples taken after fasting for at least 8 hours.   BUN 28 (H) 8 - 23 mg/dL   Creatinine, Ser 1.83 (H) 0.61 - 1.24 mg/dL   Calcium 8.1 (L) 8.9 - 10.3 mg/dL   Total Protein 6.2 (L) 6.5 - 8.1 g/dL   Albumin 3.1 (L) 3.5 - 5.0 g/dL   AST 24 15 - 41 U/L   ALT 28 0 - 44 U/L   Alkaline Phosphatase 67 38 - 126 U/L   Total Bilirubin 0.3 0.3 - 1.2 mg/dL   GFR, Estimated 38 (L) >60 mL/min    Comment: (NOTE) Calculated using the CKD-EPI Creatinine Equation (2021)    Anion gap 6 5 - 15    Comment: Performed at [facility], [address] [place], [place] [address]  Lactic acid, plasma     Status: None   Collection Time: 03/02/2021  3:40 AM  Result Value Ref Range   Lactic Acid,  Venous 1.3 0.5 - 1.9 mmol/L    Comment: Performed at [facility], [address] [place], [place] [address]  Troponin I (High Sensitivity)     Status: Abnormal   Collection Time: 03/06/2021  3:40 AM  Result Value Ref Range   Troponin I (High Sensitivity) 132 (HH) <18 ng/L    Comment: CRITICAL RESULT CALLED TO, READ BACK BY AND VERIFIED WITH: FERRONANO,J AT 4:35AM ON 03/20/2021 BY  FESTERMAN,C (NOTE) Elevated high sensitivity troponin I (hsTnI) values and significant  changes across serial measurements may suggest ACS but many other  chronic and acute conditions are known to elevate hsTnI results.  Refer to the Links section for chest pain algorithms and additional  guidance. Performed at [facility], [address] [place], [place] [address]   Brain natriuretic peptide     Status: Abnormal   Collection Time: 03/05/2021  3:40 AM  Result Value Ref Range   B Natriuretic Peptide 788.0 (H) 0.0 - 100.0 pg/mL    Comment: Performed at [facility], [address] [place], [place] [address]  Resp Panel by RT-PCR (Flu A&B, Covid) Nasopharyngeal Swab     Status: None   Collection Time: 03/11/2021  3:40 AM   Specimen: Nasopharyngeal Swab; Nasopharyngeal(NP) swabs in vial transport medium  Result Value Ref Range   SARS Coronavirus 2 by RT PCR NEGATIVE NEGATIVE    Comment: (NOTE) SARS-CoV-2 target nucleic acids are NOT DETECTED.  The SARS-CoV-2 RNA is generally detectable in upper respiratory specimens during the acute phase of infection. The lowest concentration of SARS-CoV-2 viral copies this assay can detect is 138 copies/mL. A negative result does not preclude SARS-Cov-2 infection and should not be used as the sole basis for treatment or other patient management decisions. A negative result may occur with  improper specimen collection/handling, submission of specimen other than nasopharyngeal swab, presence of viral mutation(s) within the areas targeted by this assay, and inadequate number of  viral copies(<138 copies/mL). A negative result must be combined with clinical observations, patient history, and epidemiological information. The expected result is Negative.  Fact Sheet for Patients:  EntrepreneurPulse.com.au  Fact Sheet for Healthcare Providers:  IncredibleEmployment.be  This test is no t yet approved or cleared by the [place] FDA and  has been authorized for detection and/or diagnosis of SARS-CoV-2 by FDA under an Emergency Use Authorization (EUA). This EUA will remain  in effect (meaning this test can be used) for the duration of the COVID-19 declaration under  Section 564(b)(1) of the Act, 21 U.S.C.section 360bbb-3(b)(1), unless the authorization is terminated  or revoked sooner.       Influenza A by PCR NEGATIVE NEGATIVE   Influenza B by PCR NEGATIVE NEGATIVE    Comment: (NOTE) The Xpert Xpress SARS-CoV-2/FLU/RSV plus assay is intended as an aid in the diagnosis of influenza from Nasopharyngeal swab specimens and should not be used as a sole basis for treatment. Nasal washings and aspirates are unacceptable for Xpert Xpress SARS-CoV-2/FLU/RSV testing.  Fact Sheet for Patients: BloggerCourse.comhttps://www.fda.gov/media/152166/download  Fact Sheet for Healthcare Providers: SeriousBroker.ithttps://www.fda.gov/media/152162/download  This test is not yet approved or cleared by the [place] FDA and has been authorized for detection and/or diagnosis of SARS-CoV-2 by FDA under an Emergency Use Authorization (EUA). This EUA will remain in effect (meaning this test can be used) for the duration of the COVID-19 declaration under Section 564(b)(1) of the Act, 21 U.S.C. section 360bbb-3(b)(1), unless the authorization is terminated or revoked.  Performed at [facility], [address] [place], [place] [address]   Troponin I (High Sensitivity)     Status: Abnormal   Collection Time: 12-Nov-2021  5:23 AM  Result Value Ref Range   Troponin I (High  Sensitivity) 115 (HH) <18 ng/L    Comment: CRITICAL VALUE NOTED.  VALUE IS CONSISTENT WITH PREVIOUSLY REPORTED AND CALLED VALUE. (NOTE) Elevated high sensitivity troponin I (hsTnI) values and significant  changes across serial measurements may suggest ACS but many other  chronic and acute conditions are known to elevate hsTnI results.  Refer to the Links section for chest pain algorithms and additional  guidance. Performed at [facility], [address] [place], [place] [address]     Chemistries  Recent Labs  Lab 12-Nov-2021 0340  NA 139  K 4.3  CL 100  CO2 33*  GLUCOSE 98  BUN 28*  CREATININE 1.83*  CALCIUM 8.1*  AST 24  ALT 28  ALKPHOS 67  BILITOT 0.3   ------------------------------------------------------------------------------------------------------------------  ------------------------------------------------------------------------------------------------------------------ GFR: Estimated Creatinine Clearance: 33.1 mL/min (A) (by C-G formula based on SCr of 1.83 mg/dL (H)). Liver Function Tests: Recent Labs  Lab 12-Nov-2021 0340  AST 24  ALT 28  ALKPHOS 67  BILITOT 0.3  PROT 6.2*  ALBUMIN 3.1*   No results for input(s): LIPASE, AMYLASE in the last 168 hours. No results for input(s): AMMONIA in the last 168 hours. Coagulation Profile: No results for input(s): INR, PROTIME in the last 168 hours. Cardiac Enzymes: No results for input(s): CKTOTAL, CKMB, CKMBINDEX, TROPONINI in the last 168 hours. BNP (last 3 results) No results for input(s): PROBNP in the last 8760 hours. HbA1C: No results for input(s): HGBA1C in the last 72 hours. CBG: No results for input(s): GLUCAP in the last 168 hours. Lipid Profile: No results for input(s): CHOL, HDL, LDLCALC, TRIG, CHOLHDL, LDLDIRECT in the last 72 hours. Thyroid Function Tests: No results for input(s): TSH, T4TOTAL, FREET4, T3FREE, THYROIDAB in the last 72 hours. Anemia Panel: No results for input(s): VITAMINB12,  FOLATE, FERRITIN, TIBC, IRON, RETICCTPCT in the last 72 hours.  --------------------------------------------------------------------------------------------------------------- Urine analysis:    Component Value Date/Time   COLORURINE YELLOW 02/11/2021 1225   APPEARANCEUR CLEAR 02/11/2021 1225   LABSPEC 1.020 02/11/2021 1225   PHURINE 5.5 02/11/2021 1225   GLUCOSEU NEGATIVE 02/11/2021 1225   HGBUR NEGATIVE 02/11/2021 1225   BILIRUBINUR NEGATIVE 02/11/2021 1225   KETONESUR NEGATIVE 02/11/2021 1225   PROTEINUR NEGATIVE 02/11/2021 1225   UROBILINOGEN 0.2 08/16/2012 1545   NITRITE NEGATIVE 02/11/2021 1225  LEUKOCYTESUR NEGATIVE 02/11/2021 1225      Imaging Results:    No results found.  My personal review of EKG: Rhythm atrial fibrillation with a rate of 102/min, QTc 475,no Acute ST changes   Assessment & Plan:    Principal Problem:   Acute on chronic respiratory failure with hypoxia (HCC) Active Problems:   Tobacco use disorder   Pleural effusion   Mild protein-calorie malnutrition (HCC)   Elevated troponin   Acute on chronic respiratory failure with hypoxia Requires 4 L nasal cannula at home Placed temporarily requiring 5 L nasal cannula to maintain oxygen saturations here in the hospital Most likely secondary to pleural effusion Negative respiratory panel Chest x-ray does not have new findings EKG shows A. fib 102, QTc 475, no acute changes Continue supportive care, wean off O2 as tolerated Elevated troponin Downtrending from 132, 115 Demand ischemia Monitor on telemetry Patient is currently chest pain-free Pleural effusion Continue Lasix Plan for thoracentesis Tobacco use disorder Patient is working on quitting Declines nicotine patch at this time Mild protein calorie malnutrition Encourage nutrient dense food choices A. Fib Continue Eliquis, holding Cardizem secondary to soft pressures, continue Multaq COPD Continue Pulmicort Hyperlipidemia Continue  statin   DVT Prophylaxis-   Eliquis- SCDs   AM Labs Ordered, also please review Full Orders  Family Communication: Admission, patients condition and plan of care including tests being ordered have been discussed with the patient and wife who indicate understanding and agree with the plan and Code Status.  Code Status: DNI  Admission status: Inpatient :The appropriate admission status for this patient is INPATIENT. Inpatient status is judged to be reasonable and necessary in order to provide the required intensity of service to ensure the patient's safety. The patient's presenting symptoms, physical exam findings, and initial radiographic and laboratory data in the context of their chronic comorbidities is felt to place them at high risk for further clinical deterioration. Furthermore, it is not anticipated that the patient will be medically stable for discharge from the hospital within 2 midnights of admission. The following factors support the admission status of inpatient.     The patient's presenting symptoms include dyspnea. The worrisome physical exam findings include tachycardia. The initial radiographic and laboratory data are worrisome because of elevated troponin. The chronic co-morbidities include atrial fibrillation tobacco use disorder.       * I certify that at the point of admission it is my clinical judgment that the patient will require inpatient hospital care spanning beyond 2 midnights from the point of admission due to high intensity of service, high risk for further deterioration and high frequency of surveillance required.*  Disposition: Anticipated Discharge date 72 hours discharge to home  Time spent in minutes : [place] DO

## 2021-02-27 NOTE — Progress Notes (Signed)
PROGRESS NOTE    Patient: Blake Cherry                            PCP: Kathyrn Drown, MD                    DOB: 1945-02-27            DOA: 03/18/2021 [id number]             DOS: 03/02/2021, 11:42 AM   LOS: 0 days   Date of Service: The patient was seen and examined on 03/10/2021  Subjective:   The patient was seen and examined this morning. Stable at this time. Still complaining of :  Otherwise no issues overnight .  Brief Narrative:   Blake Cherry  is a 76 y.o. male, with history of abdominal aortic aneurysm, atrial fibrillation flutter, coronary artery disease, CKD, COPD, essential hypertension, stroke, SVT, and more presents to ED with chief complaint of dyspnea, cough, with new onset of lower extremity edema. Recent admission for pneumonia, recent evaluation for A. fib on Eliquis by cardiology. Recently started on Lasix by cardiology (approximately 6 days ago. Baseline O2 demand 4 L...   Assessment & Plan:   Principal Problem:   Acute on chronic respiratory failure with hypoxia (HCC) Active Problems:   Tobacco use disorder   Pleural effusion   Mild protein-calorie malnutrition (HCC)   Elevated troponin   Acute respiratory failure with hypoxia -Multifactorial with underlying COPD, possible new onset CHF, exacerbated A. Fib Chronic pleural effusion -Baseline O2 demand 40s, currently on 5 L of oxygen -Elevated proBNP -Patient has been initiated on IV Lasix, DuoNeb bronchodilator treatment -SARS-CoV-2, influenza A/B all negative -Continue treating underlying causes  History of A. fib with RVR, elevated troponin -A. fib in the setting of respiratory distress, and elevated troponin -Cardiology consulted, no ST elevation or depression, or significant changes EKG, rate 102, -Holding Cardizem, continuing Eliquis, Multaq -DC'd by cardiology Cardiology initiating 3 dose of IV digoxin 0.25 mg   03/2020 echo LVEF 123456, indet diastolic fxn, normal RV function, mod  LAE  Lower extremity edema -Elevated proBNP 788,, chest x-ray with no signs of congestion, reviewed repeat echo -Continue Lasix if BP stabilized -Ruling out acute congestive heart failure 03/2020 echo LVEF 123456, indet diastolic fxn, normal RV function, mod LAE -Appreciate cardiology input  Chronic pleural effusion -With history of prior thoracentesis, reattempting ultrasound-guided thoracentesis reported minimal to drain -Continue with IV Lasix  Hypotensive  -Seems to be chronic previously systolic blood pressures were recorded at 90s -In the setting of IV Lasix, respite distress -With a history of underlying cardiovascular, dysrhythmia, avoiding inotropic's -We will initiate midodrine    COPD -Questionable exacerbation, status post 1 dose of IV steroids, continue DuoNeb bronchodilators, Pulmicort -Continue supplemental oxygen (baseline 4 L) currently on 5 L  Hyperlipidemia -Continue statin  History of chronic anemia -H&H stable, continuing Eliquis -Outpatient follow-up with gastroenterologist for further evaluation -Received 1 unit of PRBC on December 2022  CKD stage IIIa -Baseline creatinine 1.46, currently 1.83, BUN 28  --------------------------------------------------------------------------------------------------------------------------------------------- Cultures; None  Antimicrobials: None   Consultants: Cardiology   ------------------------------------------------------------------------------------------------------------------------------------------------  DVT prophylaxis:  SCD/Compression stockings and [id number] Code Status:   Code Status: Partial Code  Family Communication: Discussed with patient and his wife at bedside The above findings and plan of care has been discussed with patient (and family)  in detail,  they expressed understanding and agreement of  above. -Advance care planning has been discussed.   Admission status:   Status is:  Inpatient  Remains inpatient appropriate because: Continue to require IV diuretics, IV medication, along with p.o. stabilizing volume heart rate breathing treatments      Level of care: Stepdown   Procedures:   No admission procedures for hospital encounter.    Antimicrobials:  Anti-infectives (From admission, onward)    None        Medication:   apixaban  5 mg Oral BID   budesonide  0.5 mg Nebulization BID   digoxin  0.25 mg Intravenous Q6H   furosemide  40 mg Intravenous Q12H   midodrine  10 mg Oral TID WC   rosuvastatin  5 mg Oral Daily    acetaminophen **OR** acetaminophen, albuterol, ondansetron **OR** ondansetron (ZOFRAN) IV, oxyCODONE   Objective:   Vitals:   03/02/2021 1045 03/07/2021 1100 03/19/2021 1115 03/02/2021 1130  BP: (!) 86/48  (!) 84/54 (!) 70/41  Pulse: (!) 101     Resp: (!) 22 (!) 26 (!) 21 (!) 28  Temp:      TempSrc:      SpO2: 97%     Weight:      Height:        Intake/Output Summary (Last 24 hours) at 02/26/2021 1142 Last data filed at 03/02/2021 6283 Gross per 24 hour  Intake --  Output 150 ml  Net -150 ml   Filed Weights   03/15/2021 0330 03/18/2021 0959  Weight: 74.8 kg 75.2 kg     Examination:   Physical Exam  Constitution:  C/O SOB, but Alert, cooperative, no distress,   Psychiatric: Normal and stable mood and affect, cognition intact,   HEENT: Normocephalic, PERRL, otherwise with in Normal limits  Chest:Chest symmetric Cardio vascular:  S1/S2, RRR, No murmure, No Rubs or Gallops  pulmonary: On 4 L oxygen, satting 97%  Diffuse Rales, mild rhonchi -mild-moderate respirations labored, negative wheezes /mild lower lobe crackles Abdomen: Soft, non-tender, non-distended, bowel sounds,no masses, no organomegaly Muscular skeletal: Limited exam - in bed, able to move all 4 extremities, Normal strength,  Neuro: CNII-XII intact. , normal motor and sensation, reflexes intact  Extremities: ++2 pitting edema lower extremities, +2 pulses   Skin: Dry, warm to touch, negative for any Rashes, No open wounds Wounds: per nursing documentation    ------------------------------------------------------------------------------------------------------------------------------------------    LABs:  CBC Latest Ref Rng & Units 03/24/2021 02/19/2021 02/18/2021  WBC 4.0 - 10.5 K/uL 18.1(H) 13.1(H) 19.6(H)  Hemoglobin 13.0 - 17.0 g/dL 6.6(Q) 7.6(L) 7.8(L)  Hematocrit 39.0 - 52.0 % 28.9(L) 25.2(L) 26.7(L)  Platelets 150 - 400 K/uL 474(H) 450(H) 470(H)   CMP Latest Ref Rng & Units 03/10/2021 02/19/2021 02/18/2021  Glucose 70 - 99 mg/dL 98 947(M) 546(T)  BUN 8 - 23 mg/dL 03(T) 46(F) 68(L)  Creatinine 0.61 - 1.24 mg/dL 2.75(T) 7.00(F) 7.49(S)  Sodium 135 - 145 mmol/L 139 137 138  Potassium 3.5 - 5.1 mmol/L 4.3 5.0 4.8  Chloride 98 - 111 mmol/L 100 104 103  CO2 22 - 32 mmol/L 33(H) 27 27  Calcium 8.9 - 10.3 mg/dL 8.1(L) 8.2(L) 8.4(L)  Total Protein 6.5 - 8.1 g/dL 6.2(L) - -  Total Bilirubin 0.3 - 1.2 mg/dL 0.3 - -  Alkaline Phos 38 - 126 U/L 67 - -  AST 15 - 41 U/L 24 - -  ALT 0 - 44 U/L 28 - -       Micro Results Recent Results (from the past 240 hour(s))  Resp Panel by RT-PCR (Flu A&B, Covid) Nasopharyngeal Swab     Status: None   Collection Time: 03/13/2021  3:40 AM   Specimen: Nasopharyngeal Swab; Nasopharyngeal(NP) swabs in vial transport medium  Result Value Ref Range Status   SARS Coronavirus 2 by RT PCR NEGATIVE NEGATIVE Final    Comment: (NOTE) SARS-CoV-2 target nucleic acids are NOT DETECTED.  The SARS-CoV-2 RNA is generally detectable in upper respiratory specimens during the acute phase of infection. The lowest concentration of SARS-CoV-2 viral copies this assay can detect is 138 copies/mL. A negative result does not preclude SARS-Cov-2 infection and should not be used as the sole basis for treatment or other patient management decisions. A negative result may occur with  improper specimen collection/handling,  submission of specimen other than nasopharyngeal swab, presence of viral mutation(s) within the areas targeted by this assay, and inadequate number of viral copies(<138 copies/mL). A negative result must be combined with clinical observations, patient history, and epidemiological information. The expected result is Negative.  Fact Sheet for Patients:  EntrepreneurPulse.com.au  Fact Sheet for Healthcare Providers:  IncredibleEmployment.be  This test is no t yet approved or cleared by the [place] FDA and  has been authorized for detection and/or diagnosis of SARS-CoV-2 by FDA under an Emergency Use Authorization (EUA). This EUA will remain  in effect (meaning this test can be used) for the duration of the COVID-19 declaration under Section 564(b)(1) of the Act, 21 U.S.C.section 360bbb-3(b)(1), unless the authorization is terminated  or revoked sooner.       Influenza A by PCR NEGATIVE NEGATIVE Final   Influenza B by PCR NEGATIVE NEGATIVE Final    Comment: (NOTE) The Xpert Xpress SARS-CoV-2/FLU/RSV plus assay is intended as an aid in the diagnosis of influenza from Nasopharyngeal swab specimens and should not be used as a sole basis for treatment. Nasal washings and aspirates are unacceptable for Xpert Xpress SARS-CoV-2/FLU/RSV testing.  Fact Sheet for Patients: EntrepreneurPulse.com.au  Fact Sheet for Healthcare Providers: IncredibleEmployment.be  This test is not yet approved or cleared by the [place] FDA and has been authorized for detection and/or diagnosis of SARS-CoV-2 by FDA under an Emergency Use Authorization (EUA). This EUA will remain in effect (meaning this test can be used) for the duration of the COVID-19 declaration under Section 564(b)(1) of the Act, 21 U.S.C. section 360bbb-3(b)(1), unless the authorization is terminated or revoked.  Performed at [facility], [address] [place], [place] [address]     Radiology Reports DG Chest 1 View  Result Date: 02/13/2021 CLINICAL DATA:  RIGHT pleural effusion post pneumothorax EXAM: CHEST  1 VIEW COMPARISON:  Earlier study of 02/13/2021 FINDINGS: Enlargement of cardiac silhouette with vascular congestion. Atherosclerotic calcification aorta. Loculated LEFT pleural effusion unchanged. Decreased RIGHT pleural effusion and basilar atelectasis. Persistent pulmonary infiltrates. RIGHT apical scarring. No definite pneumothorax or acute osseous findings. IMPRESSION: No pneumothorax following RIGHT thoracentesis. Remainder of exam unchanged. Electronically Signed   By: Lavonia Dana M.D.   On: 02/13/2021 12:06   CT Angio Chest Pulmonary Embolism (PE) W or WO Contrast  Result Date: 02/11/2021 CLINICAL DATA:  Dyspnea, hypoxia, COPD EXAM: CT ANGIOGRAPHY CHEST WITH CONTRAST TECHNIQUE: Multidetector CT imaging of the chest was performed using the standard protocol during bolus administration of intravenous contrast. Multiplanar CT image reconstructions and MIPs were obtained to evaluate the vascular anatomy. CONTRAST:  20mL OMNIPAQUE IOHEXOL 350 MG/ML SOLN COMPARISON:  01/28/2021 FINDINGS: Cardiovascular: There is adequate opacification of the pulmonary arterial tree. No intraluminal  filling defect identified to suggest acute pulmonary embolism. The central pulmonary arteries are mildly enlarged in keeping with changes of pulmonary arterial hypertension. Extensive multi-vessel coronary artery calcification. Global cardiac size is within normal limits. No pericardial effusion. Moderate atherosclerotic calcification within the thoracic aorta. Small superimposed atheromatous plaque within the descending thoracic aorta without ulceration. No aortic aneurysm. Mediastinum/Nodes: Shotty precarinal adenopathy is stable. No additional pathologic thoracic adenopathy is identified. Esophagus unremarkable. Thyroid unremarkable. Lungs/Pleura: Mild emphysema.  Parenchymal scarring with rounded atelectasis noted within the basilar lingula and left-sided volume loss is again noted. Bibasilar cylindrical bronchiectasis again noted. Since the prior examination, there has developed scattered ground-glass infiltrate within the posterior segment of the right upper lobe and lateral segment of the right middle lobe. Additionally, there has developed progressive bronchial wall thickening and airway impaction. Together, the findings may reflect changes of superimposed acute inflammation or infection. Small right pleural effusion has developed. No pneumothorax. Upper Abdomen: No acute abnormality. Musculoskeletal: No chest wall abnormality. No acute or significant osseous findings. Review of the MIP images confirms the above findings. IMPRESSION: No pulmonary embolism. Interval development of scattered parenchymal infiltrate, progressive bronchiolar inflammation, and scattered airway impaction suggesting a superimposed acute infectious or inflammatory process. Interval development of a small right pleural effusion. Mild emphysema. Extensive multi-vessel coronary artery calcification. Morphologic changes in keeping with pulmonary arterial hypertension. Aortic Atherosclerosis (ICD10-I70.0) and Emphysema (ICD10-J43.9). Electronically Signed   By: Fidela Salisbury M.D.   On: 02/11/2021 19:42   [place] CHEST (PLEURAL EFFUSION)  Result Date: 03/10/2021 CLINICAL DATA:  Pleural effusion, assessment for thoracentesis EXAM: CHEST ULTRASOUND COMPARISON:  Chest radiograph 02/28/2020 FINDINGS: Small loculated LEFT pleural effusion identified. Volume of fluid is insufficient for thoracentesis. IMPRESSION: Small loculated LEFT pleural effusion, insufficient for thoracentesis. Electronically Signed   By: Lavonia Dana M.D.   On: 03/12/2021 10:38   DG Chest Port 1 View  Result Date: 03/11/2021 CLINICAL DATA:  76 year old male with shortness of breath. EXAM: PORTABLE CHEST 1 VIEW COMPARISON:  Portable  chest 02/16/2021 and earlier. FINDINGS: Portable AP upright view at 0349 hours. Chronic loculated left pleural effusion with associated areas of left lung round atelectasis. Right upper lobe consolidation is new since 02/11/2021, but ongoing from the end of last month and has not significantly improved. Stable cardiac size and mediastinal contours. Stable trachea. No superimposed pneumothorax or pulmonary edema. No acute osseous abnormality identified. IMPRESSION: 1. Right upper lobe consolidation first present on 02/13/2021 has not resolved. 2. Chronic loculated left pleural effusion with left lung atelectasis. 3. No new cardiopulmonary abnormality. Electronically Signed   By: Genevie Ann M.D.   On: 03/02/2021 04:04   DG CHEST PORT 1 VIEW  Result Date: 02/16/2021 CLINICAL DATA:  Acute on chronic respiratory failure EXAM: PORTABLE CHEST 1 VIEW COMPARISON:  02/13/2021 FINDINGS: Patchy bilateral airspace disease again noted, unchanged. Loculated left pleural effusion, stable. Heart is normal size. Aortic atherosclerosis. No acute bony abnormality. IMPRESSION: Stable patchy bilateral airspace disease and loculated left pleural effusion Electronically Signed   By: Rolm Baptise M.D.   On: 02/16/2021 05:14   DG CHEST PORT 1 VIEW  Result Date: 02/13/2021 CLINICAL DATA:  Shortness of breath, chest pain EXAM: PORTABLE CHEST 1 VIEW COMPARISON:  02/11/2021 FINDINGS: No significant change in AP portable chest radiograph with a moderate, loculated appearing left pleural effusion and a small right pleural effusion. Unchanged diffuse bilateral interstitial pulmonary opacity. Cardiomegaly. IMPRESSION: 1. No significant change in AP portable chest radiograph with a moderate, loculated appearing left  pleural effusion and a small right pleural effusion. 2. Unchanged diffuse bilateral interstitial pulmonary opacity, consistent with edema or infection. 3.  Cardiomegaly. Electronically Signed   By: Delanna Ahmadi M.D.   On:  02/13/2021 08:52   DG Chest Port 1 View  Result Date: 02/11/2021 CLINICAL DATA:  Dyspnea, COPD, smoker EXAM: PORTABLE CHEST 1 VIEW COMPARISON:  01/28/2021 chest radiograph. FINDINGS: Stable cardiomediastinal silhouette with top normal heart size. No pneumothorax. Loculated small to moderate lateral basilar left pleural effusion/thickening, mildly increased. Slight blunting of the right costophrenic angle. Cephalization of the pulmonary vasculature without overt pulmonary edema. Patchy and streaky opacities in the mid to lower left lung appear unchanged. IMPRESSION: 1. Loculated small to moderate lateral basilar left pleural effusion/thickening, mildly increased. 2. Chronic patchy and streaky opacities in the mid to lower left lung, favor nonspecific scarring. 3. Slight blunting of the right costophrenic angle, possible small right pleural effusion. Electronically Signed   By: Ilona Sorrel M.D.   On: 02/11/2021 11:26   US THORACENTESIS ASP PLEURAL SPACE W/IMG GUIDE  Result Date: 02/13/2021 INDICATION: Moderate right pleural effusion Very small- left effusion EXAM: ULTRASOUND GUIDED right THORACENTESIS MEDICATIONS: 10 cc 1% lidocaine. COMPLICATIONS: None immediate. PROCEDURE: An ultrasound guided thoracentesis was thoroughly discussed with the patient and questions answered. The benefits, risks, alternatives and complications were also discussed. The patient understands and wishes to proceed with the procedure. Written consent was obtained. Ultrasound was performed to localize and mark an adequate pocket of fluid in the right chest. The area was then prepped and draped in the normal sterile fashion. 1% Lidocaine was used for local anesthesia. Under ultrasound guidance a Yueh catheter was introduced. Thoracentesis was performed. The catheter was removed and a dressing applied. FINDINGS: A total of approximately 600 cc of yellow fluid was removed. Samples were sent to the laboratory as requested by the  clinical team. IMPRESSION: Successful ultrasound guided right thoracentesis yielding 600 cc yellow fluid of pleural fluid. CXR: No ptx per Dr Thornton Papas Read by Lavonia Drafts [facility] Electronically Signed   By: Lavonia Dana M.D.   On: 02/13/2021 13:18    SIGNED: Deatra James, MD, FHM. [facility],  Pager (please use [email or web address] to page/text) Please use Epic Secure Chat for non-urgent communication (7AM-7PM)  > 70 minutes of critical care time was spent in stabilizing this patient, reviewing all medical record seen and examined the patient, discussing current plan of care with cardiology, patient and family  .    if 7PM-7AM, please contact night-coverage [email or web address], 03/13/2021, 11:42 AM

## 2021-02-28 ENCOUNTER — Inpatient Hospital Stay: Payer: Self-pay

## 2021-02-28 ENCOUNTER — Encounter (HOSPITAL_COMMUNITY): Payer: Self-pay | Admitting: Family Medicine

## 2021-02-28 ENCOUNTER — Inpatient Hospital Stay (HOSPITAL_COMMUNITY): Payer: PPO

## 2021-02-28 DIAGNOSIS — J9621 Acute and chronic respiratory failure with hypoxia: Secondary | ICD-10-CM | POA: Diagnosis not present

## 2021-02-28 DIAGNOSIS — R579 Shock, unspecified: Secondary | ICD-10-CM | POA: Diagnosis not present

## 2021-02-28 DIAGNOSIS — R6521 Severe sepsis with septic shock: Secondary | ICD-10-CM

## 2021-02-28 DIAGNOSIS — R778 Other specified abnormalities of plasma proteins: Secondary | ICD-10-CM

## 2021-02-28 DIAGNOSIS — G928 Other toxic encephalopathy: Secondary | ICD-10-CM | POA: Diagnosis not present

## 2021-02-28 DIAGNOSIS — I5021 Acute systolic (congestive) heart failure: Secondary | ICD-10-CM | POA: Diagnosis not present

## 2021-02-28 DIAGNOSIS — J90 Pleural effusion, not elsewhere classified: Secondary | ICD-10-CM | POA: Diagnosis not present

## 2021-02-28 DIAGNOSIS — I959 Hypotension, unspecified: Secondary | ICD-10-CM | POA: Diagnosis present

## 2021-02-28 DIAGNOSIS — J9601 Acute respiratory failure with hypoxia: Secondary | ICD-10-CM

## 2021-02-28 DIAGNOSIS — A419 Sepsis, unspecified organism: Secondary | ICD-10-CM | POA: Diagnosis not present

## 2021-02-28 LAB — COMPREHENSIVE METABOLIC PANEL
ALT: 21 U/L (ref 0–44)
AST: 32 U/L (ref 15–41)
Albumin: 3.8 g/dL (ref 3.5–5.0)
Alkaline Phosphatase: 57 U/L (ref 38–126)
Anion gap: 10 (ref 5–15)
BUN: 37 mg/dL — ABNORMAL HIGH (ref 8–23)
CO2: 33 mmol/L — ABNORMAL HIGH (ref 22–32)
Calcium: 8.6 mg/dL — ABNORMAL LOW (ref 8.9–10.3)
Chloride: 97 mmol/L — ABNORMAL LOW (ref 98–111)
Creatinine, Ser: 1.89 mg/dL — ABNORMAL HIGH (ref 0.61–1.24)
GFR, Estimated: 37 mL/min — ABNORMAL LOW (ref >60)
Glucose, Bld: 134 mg/dL — ABNORMAL HIGH (ref 70–99)
Potassium: 5 mmol/L (ref 3.5–5.1)
Sodium: 140 mmol/L (ref 135–145)
Total Bilirubin: 0.5 mg/dL (ref 0.3–1.2)
Total Protein: 6.2 g/dL — ABNORMAL LOW (ref 6.5–8.1)

## 2021-02-28 LAB — CBC
HCT: 26 % — ABNORMAL LOW (ref 39.0–52.0)
Hemoglobin: 7.7 g/dL — ABNORMAL LOW (ref 13.0–17.0)
MCH: 29.2 pg (ref 26.0–34.0)
MCHC: 29.6 g/dL — ABNORMAL LOW (ref 30.0–36.0)
MCV: 98.5 fL (ref 80.0–100.0)
Platelets: 480 10*3/uL — ABNORMAL HIGH (ref 150–400)
RBC: 2.64 MIL/uL — ABNORMAL LOW (ref 4.22–5.81)
RDW: 15.9 % — ABNORMAL HIGH (ref 11.5–15.5)
WBC: 26.5 10*3/uL — ABNORMAL HIGH (ref 4.0–10.5)
nRBC: 0 % (ref 0.0–0.2)

## 2021-02-28 LAB — BLOOD GAS, ARTERIAL
Acid-Base Excess: 3.4 mmol/L — ABNORMAL HIGH (ref 0.0–2.0)
Bicarbonate: 26.1 mmol/L (ref 20.0–28.0)
Drawn by: 27016
FIO2: 44
O2 Saturation: 98.7 %
Patient temperature: 37
pCO2 arterial: 103 mmHg (ref 32.0–48.0)
pH, Arterial: 7.122 — CL (ref 7.350–7.450)
pO2, Arterial: 190 mmHg — ABNORMAL HIGH (ref 83.0–108.0)

## 2021-02-28 LAB — PROTIME-INR
INR: 1.4 — ABNORMAL HIGH (ref 0.8–1.2)
Prothrombin Time: 16.9 seconds — ABNORMAL HIGH (ref 11.4–15.2)

## 2021-02-28 LAB — MAGNESIUM: Magnesium: 2.6 mg/dL — ABNORMAL HIGH (ref 1.7–2.4)

## 2021-02-28 LAB — CORTISOL: Cortisol, Plasma: 2.6 ug/dL

## 2021-02-28 LAB — BRAIN NATRIURETIC PEPTIDE: B Natriuretic Peptide: 1318 pg/mL — ABNORMAL HIGH (ref 0.0–100.0)

## 2021-02-28 LAB — APTT: aPTT: 28 seconds (ref 24–36)

## 2021-02-28 LAB — LACTIC ACID, PLASMA: Lactic Acid, Venous: 0.7 mmol/L (ref 0.5–1.9)

## 2021-02-28 LAB — PROCALCITONIN: Procalcitonin: <0.1 ng/mL

## 2021-02-28 MED ORDER — MORPHINE SULFATE (PF) 2 MG/ML IV SOLN
2.0000 mg | Freq: Once | INTRAVENOUS | Status: AC
  Administered 2021-02-28: 2 mg via INTRAVENOUS
  Filled 2021-02-28: qty 1

## 2021-02-28 MED ORDER — SODIUM CHLORIDE 0.9% FLUSH: 10.0000 mL | INTRAVENOUS | Status: DC | PRN

## 2021-02-28 MED ORDER — FUROSEMIDE 10 MG/ML IJ SOLN
INTRAMUSCULAR | Status: AC
  Filled 2021-02-28: qty 8

## 2021-02-28 MED ORDER — AMIODARONE HCL IN DEXTROSE 360-4.14 MG/200ML-% IV SOLN
30.0000 mg/h | INTRAVENOUS | Status: DC
  Administered 2021-02-28 – 2021-03-03 (×6): 30 mg/h via INTRAVENOUS
  Filled 2021-02-28 (×6): qty 200

## 2021-02-28 MED ORDER — AMIODARONE HCL IN DEXTROSE 360-4.14 MG/200ML-% IV SOLN
60.0000 mg/h | INTRAVENOUS | Status: AC
  Administered 2021-02-28: 60 mg/h via INTRAVENOUS
  Filled 2021-02-28: qty 200

## 2021-02-28 MED ORDER — AMIODARONE LOAD VIA INFUSION
150.0000 mg | Freq: Once | INTRAVENOUS | Status: AC
  Administered 2021-02-28: 150 mg via INTRAVENOUS
  Filled 2021-02-28: qty 83.34

## 2021-02-28 MED ORDER — HYDROCORTISONE SOD SUC (PF) 100 MG IJ SOLR
100.0000 mg | Freq: Three times a day (TID) | INTRAMUSCULAR | Status: AC
  Administered 2021-02-28 – 2021-03-03 (×9): 100 mg via INTRAVENOUS
  Filled 2021-02-28 (×9): qty 2

## 2021-02-28 MED ORDER — FUROSEMIDE 10 MG/ML IJ SOLN
60.0000 mg | Freq: Two times a day (BID) | INTRAMUSCULAR | Status: DC
  Administered 2021-02-28 (×2): 60 mg via INTRAVENOUS
  Filled 2021-02-28 (×2): qty 6

## 2021-02-28 MED ORDER — SODIUM CHLORIDE 0.9 % IV SOLN: 2.0000 g | Freq: Once | INTRAVENOUS | Status: DC

## 2021-02-28 MED ORDER — SODIUM CHLORIDE 0.9% FLUSH
10.0000 mL | Freq: Two times a day (BID) | INTRAVENOUS | Status: DC
  Administered 2021-02-28 – 2021-03-03 (×7): 10 mL

## 2021-02-28 MED ORDER — SODIUM CHLORIDE 0.9 % IV SOLN
2.0000 g | Freq: Two times a day (BID) | INTRAVENOUS | Status: DC
  Administered 2021-02-28 (×2): 2 g via INTRAVENOUS
  Filled 2021-02-28 (×2): qty 2

## 2021-02-28 MED ORDER — HALOPERIDOL LACTATE 5 MG/ML IJ SOLN
5.0000 mg | Freq: Four times a day (QID) | INTRAMUSCULAR | Status: DC | PRN
  Administered 2021-02-28 – 2021-03-02 (×5): 5 mg via INTRAVENOUS
  Filled 2021-02-28 (×6): qty 1

## 2021-02-28 MED ORDER — SODIUM CHLORIDE 0.9 % IV SOLN
500.0000 mg | INTRAVENOUS | Status: DC
  Administered 2021-02-28 – 2021-03-03 (×4): 500 mg via INTRAVENOUS
  Filled 2021-02-28 (×4): qty 5

## 2021-02-28 MED ORDER — LORAZEPAM 2 MG/ML IJ SOLN
1.0000 mg | Freq: Once | INTRAMUSCULAR | Status: AC
  Administered 2021-02-28: 1 mg via INTRAVENOUS
  Filled 2021-02-28: qty 1

## 2021-02-28 NOTE — Consult Note (Signed)
[facility]   Patient name: Blake Cherry Admit date: 03/14/2021  DOB: [birth year] LOS: 1  MRN: [id number] Consult date: 02/28/2021  Referring provider: Dr. Roger Shelter, [facility]: dyspnea    History:  76 yo male smoker was in hospital from 02/11/21 to 02/19/21 with respiratory failure from pneumonia, COPD exacerbation and loculated Lt pleural effusion.  He presented to [facility] on 02/27/21 with dyspnea.  He was found to have elevated troponin with a fib/flutter and cardiology consulted.  Found to have new systolic CHF with hypotension.  He developed agitated and progressive respiratory distress.  He developed hypoxia and hypercapnia, and started on Bipap.  PCCM consulted to assist with respiratory management in ICU.  Past medical history:  AAA s/p repair, A fib/flutter, Carotid stenosis s/p Lt CEA, COPD, CVA  Significant events:  1/05 Admit, cardiology consulted 1/06 agitated and treated with haldol; developed hypercapnia and started on Bipap; DNR, no escalation of care  Studies:  Spirometry 05/07/15 >> FEV1 1.06 (34%), FEV1% 47 PET scan 07/29/20 >> rounded ATX in Lingula/LUL, emphysema, airway thickening with mucus plugging, Rt apical scarring Rt thoracentesis 02/13/21 >> 600 ml yellow fluid, glucose 121, protein < 3, cells 783 (47% neutrophils, 28% lymphocytes, 20% macrophages, 5% eosinophils), reactive mesothelial cells on cytology Echo 04/02/20 >> EF 60 to 65%, mod LA dilation CT angio chest 01/28/21 >> upper lobe predominate emphysema, mild bronchial wall thickening, stable Lt loculated effusion, cylindrical BTX at bases CT angio chest 02/11/21 >> increased airway thickening and impaction Echo 02/27/21 >> EF 30 to 35%, mild LVH, mild/mod LA dilation Lt chest u/s 02/27/21 >> small, loculated effusion  Micro:  COVID/Flu 1/05 >> negative Blood 1/06 >>   Lines:  Rt PICC 1/06 >>    Antibiotics:  Cefepime 1/06 >>  Zithromax 1/06 >>    Consults:  Cardiology    Interim history:  Started on Bipap and pressors.  Vital signs:  BP 121/68    Pulse (!) 126    Temp 98.2 F (36.8 C) (Axillary)    Resp 20    Ht 5\' 7"  (1.702 m)    Wt 72.8 kg    SpO2 94%    BMI 25.14 kg/m   Intake/output:  I/O last 3 completed shifts: In: 543.2 [P.O.:150; I.V.:93.2; IV Piggyback:300] Out: 2200 [Urine:2200]   Physical exam:   General - ill appearing Eyes - pupils reactive ENT - Bipap mask on Cardiac - irregular, tachycardic Chest - poor air movement, decreased BS, paradoxical breathing pattern Abdomen - decreased bowel sounds Extremities - cool, 1+ edema Skin - no rashes Neuro - moves extremities, follows simple commands, intermittently agitated  Best practice:   DVT - eliquis SUP - not indicated Nutrition - heart healthy   Discussion:  I remember evaluating him when he was in hospital in December.  He is much worse now.  I am concerned that with his combination of new onset systolic CHF, shock, and severe COPD he does not have the reserve to fight through this current hospitalization.  Assessment/plan:   Acute hypoxic/hypercapnic respiratory failure 2nd to sedation in setting of COPD with tobacco abuse, bronchiectasis, rounded ATX LUL/lingula. - he has recurrent exacerbations needing steroids and antibiotics in the past 1 year - Bipap as needed - goal SpO2 90 to 95% - scheduled BDs - ABx per primary team >> procalcitonin negative; suspect these can be d/c'ed soon   Partially loculated Left pleural effusion. - present since imaging study from 03/06/20; he was treated  for a respiratory infection at that time - IR did u/s on 1/05 >> to small to warrant thoracentesis  Acute systolic CHF. Elevated troponin. A fib/flutter. - per cardiology  Shock. - suspect this is more likely related to cardiac status rather than sepsis  - pressors to keep MAP > 65 - continue stress dose steroids  Iron deficiency anemia and anemia of  critical illness. - f/u CBC - transfuse for Hb < 7  Goals of care. - had detailed d/w pt's wife at bedside - she understands that he is in a tough spot.  She agrees to DNR status.  She would like to continue current therapies to see if he can improve.  She would not want to escalate care.  If his medical status get worse, then she is agreeable to transition to comfort measures  D/w Dr. Roger Shelter  Resolved hospital problems:    Labs:   CMP Latest Ref Rng & Units 02/28/2021 03/02/2021 02/19/2021  Glucose 70 - 99 mg/dL 134(H) 98 160(H)  BUN 8 - 23 mg/dL 37(H) 28(H) 43(H)  Creatinine 0.61 - 1.24 mg/dL 1.89(H) 1.83(H) 1.63(H)  Sodium 135 - 145 mmol/L 140 139 137  Potassium 3.5 - 5.1 mmol/L 5.0 4.3 5.0  Chloride 98 - 111 mmol/L 97(L) 100 104  CO2 22 - 32 mmol/L 33(H) 33(H) 27  Calcium 8.9 - 10.3 mg/dL 8.6(L) 8.1(L) 8.2(L)  Total Protein 6.5 - 8.1 g/dL 6.2(L) 6.2(L) -  Total Bilirubin 0.3 - 1.2 mg/dL 0.5 0.3 -  Alkaline Phos 38 - 126 U/L 57 67 -  AST 15 - 41 U/L 32 24 -  ALT 0 - 44 U/L 21 28 -    CBC Latest Ref Rng & Units 02/28/2021 02/28/2021 02/19/2021  WBC 4.0 - 10.5 K/uL 26.5(H) 18.1(H) 13.1(H)  Hemoglobin 13.0 - 17.0 g/dL 7.7(L) 8.5(L) 7.6(L)  Hematocrit 39.0 - 52.0 % 26.0(L) 28.9(L) 25.2(L)  Platelets 150 - 400 K/uL 480(H) 474(H) 450(H)    ABG    Component Value Date/Time   PHART 7.122 (LL) 02/28/2021 0819   PCO2ART 103 (HH) 02/28/2021 0819   PO2ART 190 (H) 02/28/2021 0819   HCO3 26.1 02/28/2021 0819   TCO2 25 10/03/2017 1523   ACIDBASEDEF 0.8 08/22/2012 0615   O2SAT 98.7 02/28/2021 0819    CBG (last 3)  No results for input(s): GLUCAP in the last 72 hours.   Past surgical history:  He  has a past surgical history that includes Carotid stent (2007); Carotid endarterectomy (Left, 2007); Testicle torsion reduction; Abdominal aortic endovascular stent graft (N/A, 08/22/2012); and abdominal aortagram (N/A, 03/10/2013).  Social history:  He  reports that he has been smoking  cigarettes. He has a 30.00 pack-year smoking history. He has quit using smokeless tobacco.  His smokeless tobacco use included snuff. He reports that he does not drink alcohol and does not use drugs.   Review of systems:  Unable to obtain  Family history:  His family history includes Cancer in his mother; Diabetes in his father and mother; Hypertension in his mother.    Medications:   No current facility-administered medications on file prior to encounter.   Current Outpatient Medications on File Prior to Encounter  Medication Sig   albuterol (PROVENTIL) (2.5 MG/3ML) 0.083% nebulizer solution USE 1 VIAL IN NEBULIZER EVERY 4 HOURS AS NEEDED FOR WHEEZING OR SHORTNESS OF BREATH   apixaban (ELIQUIS) 5 MG TABS tablet Take 1 tablet (5 mg total) by mouth 2 (two) times daily.   Budeson-Glycopyrrol-Formoterol (BREZTRI AEROSPHERE) 160-9-4.8  MCG/ACT AERO Inhale 2 puffs into the lungs in the morning and at bedtime. (Patient taking differently: Inhale 2 puffs into the lungs at bedtime.)   budesonide (PULMICORT) 0.5 MG/2ML nebulizer solution USE ONE VIAL IN NEBULIZER TWICE DAILY   diltiazem (CARDIZEM CD) 180 MG 24 hr capsule Take 1 capsule (180 mg total) by mouth daily.   dronedarone (MULTAQ) 400 MG tablet Take 1 tablet (400 mg total) by mouth 2 (two) times daily with a meal.   ferrous sulfate 325 (65 FE) MG tablet Take 1 tablet (325 mg total) by mouth 2 (two) times daily with a meal.   folic acid (FOLVITE) 1 MG tablet Take 1 tablet (1 mg total) by mouth daily.   furosemide (LASIX) 20 MG tablet Take 1 tablet (20 mg total) by mouth daily as needed for fluid or edema.   guaiFENesin (MUCINEX) 600 MG 12 hr tablet Take 1 tablet (600 mg total) by mouth 2 (two) times daily.   rosuvastatin (CRESTOR) 5 MG tablet Take 1 tablet (5 mg total) by mouth daily.   vitamin B-12 1000 MCG tablet Take 1 tablet (1,000 mcg total) by mouth daily.   mometasone (ELOCON) 0.1 % cream Apply BID PRN (Patient not taking: Reported on  02/11/2021)   PROAIR HFA 108 (90 Base) MCG/ACT inhaler INHALE 2 PUFFS BY MOUTH EVERY 6 HOURS AS NEEDED FOR WHEEZING FOR SHORTNESS OF BREATH (Patient not taking: Reported on 03/17/2021)     Critical care time: 38 minutes  Chesley Mires, MD [place] Pager - [phone] 02/28/2021, 2:15 PM

## 2021-02-28 NOTE — Progress Notes (Signed)
Progress Note  Patient Name: Blake Cherry Date of Encounter: 02/28/2021  [facility] Cardiologist: Rozann Lesches, MD   Subjective   Very agitated overnight and into AM, sedated currently with haldol  Inpatient Medications    Scheduled Meds:  apixaban  5 mg Oral BID   budesonide  0.5 mg Nebulization BID   Chlorhexidine Gluconate Cloth  6 each Topical Daily   ipratropium  0.5 mg Nebulization TID   levalbuterol  1.25 mg Nebulization TID   rosuvastatin  5 mg Oral Daily   Continuous Infusions:  azithromycin     ceFEPime (MAXIPIME) IV     norepinephrine (LEVOPHED) Adult infusion 2 mcg/min (03/21/2021 2120)   PRN Meds: acetaminophen **OR** acetaminophen, haloperidol lactate, melatonin, ondansetron **OR** ondansetron (ZOFRAN) IV, oxyCODONE   Vital Signs    Vitals:   02/28/21 0615 02/28/21 0630 02/28/21 0645 02/28/21 0700  BP: 117/70 121/64 120/70 121/68  Pulse: (!) 125 (!) 126 (!) 125 (!) 126  Resp: 20 20 (!) 22 20  Temp:      TempSrc:      SpO2: 99% 100% 100% 100%  Weight:      Height:        Intake/Output Summary (Last 24 hours) at 02/28/2021 0746 Last data filed at 02/28/2021 [id number] Gross per 24 hour  Intake 543.19 ml  Output 2200 ml  Net -1656.81 ml   Last 3 Weights 02/28/2021 03/15/2021 02/28/2021  Weight (lbs) 160 lb 7.9 oz 165 lb 12.6 oz 165 lb  Weight (kg) 72.8 kg 75.2 kg 74.844 kg      Telemetry    Afib, aflutter elevated rates - Personally Reviewed  ECG    N/a - Personally Reviewed  Physical Exam   GEN: No acute distress.   Neck: elevated JVD Cardiac: regular, tachy Respiratory: crackles bilaterally GI: Soft, nontender, non-distended  MS: No edema; No deformity. Neuro:  Nonfocal  Psych: Normal affect   Labs    High Sensitivity Troponin:   Recent Labs  Lab 03/01/2021 0340 03/11/2021 0523  TROPONINIHS 132* 115*     Chemistry Recent Labs  Lab 03/01/2021 0340 02/28/21 0445  NA 139 140  K 4.3 5.0  CL 100 97*  CO2 33* 33*  GLUCOSE 98 134*   BUN 28* 37*  CREATININE 1.83* 1.89*  CALCIUM 8.1* 8.6*  MG  --  2.6*  PROT 6.2* 6.2*  ALBUMIN 3.1* 3.8  AST 24 32  ALT 28 21  ALKPHOS 67 57  BILITOT 0.3 0.5  GFRNONAA 38* 37*  ANIONGAP 6 10    Lipids No results for input(s): CHOL, TRIG, HDL, LABVLDL, LDLCALC, CHOLHDL in the last 168 hours.  Hematology Recent Labs  Lab 03/02/2021 0340 02/28/21 0445  WBC 18.1* 26.5*  RBC 2.91* 2.64*  HGB 8.5* 7.7*  HCT 28.9* 26.0*  MCV 99.3 98.5  MCH 29.2 29.2  MCHC 29.4* 29.6*  RDW 15.4 15.9*  PLT 474* 480*   Thyroid No results for input(s): TSH, FREET4 in the last 168 hours.  BNP Recent Labs  Lab 02/24/2021 0340 02/28/21 0445  BNP 788.0* 1,318.0*    DDimer No results for input(s): DDIMER in the last 168 hours.   Radiology    [place] CHEST (PLEURAL EFFUSION)  Result Date: 03/02/2021 CLINICAL DATA:  Pleural effusion, assessment for thoracentesis EXAM: CHEST ULTRASOUND COMPARISON:  Chest radiograph 02/28/2020 FINDINGS: Small loculated LEFT pleural effusion identified. Volume of fluid is insufficient for thoracentesis. IMPRESSION: Small loculated LEFT pleural effusion, insufficient for thoracentesis. Electronically Signed  By: Lavonia Dana M.D.   On: 03/11/2021 10:38   DG Chest Port 1 View  Result Date: 02/23/2021 CLINICAL DATA:  76 year old male with shortness of breath. EXAM: PORTABLE CHEST 1 VIEW COMPARISON:  Portable chest 02/16/2021 and earlier. FINDINGS: Portable AP upright view at 0349 hours. Chronic loculated left pleural effusion with associated areas of left lung round atelectasis. Right upper lobe consolidation is new since 02/11/2021, but ongoing from the end of last month and has not significantly improved. Stable cardiac size and mediastinal contours. Stable trachea. No superimposed pneumothorax or pulmonary edema. No acute osseous abnormality identified. IMPRESSION: 1. Right upper lobe consolidation first present on 02/13/2021 has not resolved. 2. Chronic loculated left pleural  effusion with left lung atelectasis. 3. No new cardiopulmonary abnormality. Electronically Signed   By: Genevie Ann M.D.   On: 03/20/2021 04:04   ECHOCARDIOGRAM COMPLETE  Result Date: 03/25/2021    ECHOCARDIOGRAM REPORT   Patient Name:   Blake Cherry Date of Exam: 03/25/2021 Medical Rec #:  [id number]        Height:       67.0 in Accession #:    [id number]       Weight:       165.8 lb Date of Birth:  07-Apr-1945         BSA:          1.867 m Patient Age:    44 years         BP:           91/56 mmHg Patient Gender: M                HR:           126 bpm. Exam Location:  [facility] Procedure: 2D Echo, Cardiac Doppler and Color Doppler Indications:    CHF  History:        Patient has prior history of Echocardiogram examinations, most                 recent 04/02/2020. COPD and Stroke, Arrythmias:Atrial Flutter and                 Tachycardia, Signs/Symptoms:Chest Pain; Risk                 Factors:Hypertension, Dyslipidemia and Current Smoker.  Sonographer:    Wenda Low Referring Phys: [id number] ASIA B [place]  1. The apex is akinetic. The mid to distal anterolateral, inferoseptal, anterior, anteroseptal, inferior walls appear hypokinetic. Pattern could be suggesting of a stress induced cardiomyopathy. . Left ventricular ejection fraction, by estimation, is 30  to 35%. The left ventricle has moderately decreased function. The left ventricle demonstrates regional wall motion abnormalities (see scoring diagram/findings for description). There is mild left ventricular hypertrophy. Left ventricular diastolic parameters are indeterminate.  2. Right ventricular systolic function is normal. The right ventricular size is normal. There is mildly elevated pulmonary artery systolic pressure.  3. Left atrial size was mild to moderately dilated.  4. Right atrial size was mildly dilated.  5. The mitral valve is normal in structure. Trivial mitral valve regurgitation. No evidence of mitral stenosis.  6. The  tricuspid valve is abnormal.  7. The aortic valve is tricuspid. There is mild calcification of the aortic valve. There is mild thickening of the aortic valve. Aortic valve regurgitation is not visualized. No aortic stenosis is present.  8. The inferior vena cava is normal in size with greater than 50% respiratory variability, suggesting  right atrial pressure of 3 mmHg. FINDINGS  Left Ventricle: The apex is akinetic. The mid to distal anterolateral, inferoseptal, anterior, anteroseptal, inferior walls appear hypokinetic. Pattern could be suggesting of a stress induced cardiomyopathy. Left ventricular ejection fraction, by estimation, is 30 to 35%. The left ventricle has moderately decreased function. The left ventricle demonstrates regional wall motion abnormalities. Definity contrast agent was given IV to delineate the left ventricular endocardial borders. The left ventricular internal cavity size was normal in size. There is mild left ventricular hypertrophy. Left ventricular diastolic parameters are indeterminate. Right Ventricle: The right ventricular size is normal. No increase in right ventricular wall thickness. Right ventricular systolic function is normal. There is mildly elevated pulmonary artery systolic pressure. The tricuspid regurgitant velocity is 3.00  m/s, and with an assumed right atrial pressure of 8 mmHg, the estimated right ventricular systolic pressure is 123XX123 mmHg. Left Atrium: Left atrial size was mild to moderately dilated. Right Atrium: Right atrial size was mildly dilated. Pericardium: There is no evidence of pericardial effusion. Mitral Valve: The mitral valve is normal in structure. There is mild thickening of the mitral valve leaflet(s). There is mild calcification of the mitral valve leaflet(s). Mild mitral annular calcification. Trivial mitral valve regurgitation. No evidence  of mitral valve stenosis. MV peak gradient, 9.2 mmHg. The mean mitral valve gradient is 3.0 mmHg. Tricuspid  Valve: The tricuspid valve is abnormal. Tricuspid valve regurgitation is mild . No evidence of tricuspid stenosis. Aortic Valve: The aortic valve is tricuspid. There is mild calcification of the aortic valve. There is mild thickening of the aortic valve. There is mild aortic valve annular calcification. Aortic valve regurgitation is not visualized. No aortic stenosis  is present. Aortic valve mean gradient measures 3.0 mmHg. Aortic valve peak gradient measures 5.4 mmHg. Aortic valve area, by VTI measures 2.62 cm. Pulmonic Valve: The pulmonic valve was not well visualized. Pulmonic valve regurgitation is not visualized. No evidence of pulmonic stenosis. Aorta: The aortic root is normal in size and structure. Venous: The inferior vena cava is normal in size with greater than 50% respiratory variability, suggesting right atrial pressure of 3 mmHg. IAS/Shunts: No atrial level shunt detected by color flow Doppler.  LEFT VENTRICLE PLAX 2D LVIDd:         5.00 cm   Diastology LVIDs:         3.10 cm   LV e' medial:    5.98 cm/s LV PW:         1.20 cm   LV E/e' medial:  26.9 LV IVS:        1.20 cm   LV e' lateral:   9.90 cm/s LVOT diam:     2.00 cm   LV E/e' lateral: 16.3 LV SV:         62 LV SV Index:   33 LVOT Area:     3.14 cm  RIGHT VENTRICLE RV Basal diam:  3.45 cm RV Mid diam:    3.10 cm RV S prime:     14.70 cm/s TAPSE (M-mode): 1.8 cm LEFT ATRIUM             Index        RIGHT ATRIUM           Index LA diam:        4.70 cm 2.52 cm/m   RA Area:     20.60 cm LA Vol (A2C):   81.5 ml 43.65 ml/m  RA Volume:   64.60 ml  34.60 ml/m LA Vol (A4C):   80.6 ml 43.16 ml/m LA Biplane Vol: 82.0 ml 43.91 ml/m  AORTIC VALVE                    PULMONIC VALVE AV Area (Vmax):    2.48 cm     PV Vmax:       0.69 m/s AV Area (Vmean):   2.41 cm     PV Peak grad:  1.9 mmHg AV Area (VTI):     2.62 cm AV Vmax:           116.00 cm/s AV Vmean:          81.700 cm/s AV VTI:            0.235 m AV Peak Grad:      5.4 mmHg AV Mean Grad:       3.0 mmHg LVOT Vmax:         91.60 cm/s LVOT Vmean:        62.700 cm/s LVOT VTI:          0.196 m LVOT/AV VTI ratio: 0.83  AORTA Ao Root diam: 3.10 cm Ao Asc diam:  3.00 cm MITRAL VALVE                TRICUSPID VALVE MV Area (PHT): 3.91 cm     TR Peak grad:   36.0 mmHg MV Area VTI:   1.89 cm     TR Vmax:        300.00 cm/s MV Peak grad:  9.2 mmHg MV Mean grad:  3.0 mmHg     SHUNTS MV Vmax:       1.52 m/s     Systemic VTI:  0.20 m MV Vmean:      66.6 cm/s    Systemic Diam: 2.00 cm MV Decel Time: 194 msec MV E velocity: 161.00 cm/s Carlyle Dolly MD Electronically signed by Carlyle Dolly MD Signature Date/Time: 03/11/2021/4:25:41 PM    Final    [place] EKG SITE RITE  Result Date: 03/10/2021 If Site Rite image not attached, placement could not be confirmed due to current cardiac rhythm.   Cardiac Studies   Patient Profile     Blake Cherry is a 76 y.o. male with a hx of paroxysmal afib/aflutter who is being seen 03/20/2021 for the evaluation of tachycardia at the request of Dr Roger Shelter.  Assessment & Plan    1.Paroxysmal afib/flutter  - 01/30/21 visit with Dr Domenic Polite noted to be in atypical aflutter vs ectopic atach, similar to rhythm during Dec 6 ER visit. Started on multaq 400mg  bid, digoxin was stopped and dilt lowered to 180mg  daily with plans for afib clinic evaluation if persistent arrhythmias.  - rate control overall has been limited due to soft bp's   - recurrent afib on multaq. With new signs of clinical HF and new drop in LVEF we have stopped multaq - Antiarrhythmic therapy difficult due to clinical HF, CKD, COPD. Rate control limited by low bp's.  - loaded with IV digoxin 0.25mg  x 3 doses yesterday  - tele looks more like atypical flutter this AM. I don't see an alternative to amiodarone at this time given the limitations reported above, could plan on short course. Once out of systemic illness could consider rate control alone.        2. Acute systolic HF - elevated BNP with LE  edema, CXR without significant edema - echo with acute drop in LVEF to 30-35% -  WMA pattern could suggest stress induced CM. Admission just a few days ago with pneumonia, hypoxia, copd exacerbation. Must always consider ischemic CM as well. Trop trend would not suggest a significant acute ischemic event this admission.   - medical therapy limited by hypotension, renal dysfunction - received IV lasix 40mg  x 3 doses yesterday. Negative 1.6 L, slight uptrend in Cr.BNP trended up from 788 to 1318.  - dose IV lasix 60mg  bid today   3. Shock - history of chrnoic hypotension SBP often in the 90s while on home cardizem at outpatient visits - progressing hypotension yesterday SBPs to 70s, started on peripheral levophed - home bp meds have been held - procalc was neg. Lactic acid was up to 3.7 yesterday, back down to 0.7 today. AM cortisol pending. He is on emperic abx per primary team. Drop in LVEF to 30-35%, may be a cardiogenic component.  - today bp 121/68 on levophed at 3 peripherally, will discuss with primary team PICC line or central line given pressor requirement, would also be able to check coox and cvp.      3.COPD exacerbation - chronic COPD on 4L [place] at home - per primary team.   4. Chronic pleural effusion - chronic left loculated effusion, [place] this admission small locluated too small for thoracentesis.    5. ELevated troponin - mild and downtrending, no specific ischemic changes on EKG -echo with acute drop in LVEF to 30-35% with multiple WMAs - consider ischemic testing when more medically stable.    6. CKD - baseline 1.3 to 1.6, today 1.8 in setting of hypotension, HF, diuresis    For questions or updates, please contact [place] Please consult [email or web address] for contact info under        Signed, Carlyle Dolly, MD  02/28/2021, 7:46 AM

## 2021-02-28 NOTE — Progress Notes (Signed)
Patient is confused, trying to climb out of the bed. Patient is high fall risk. Mats in place, socks on patient's feet, fall armband on patient. Stat ABG ordered. Respiratory called. Dr. Roger Shelter made aware. PRN order for haldol. Patient's wife called to come to hospital to try to help out. AC made aware that a sitter is needed. Currently one is not available. ICU NT pulled to sit with patient until sitter becomes available.

## 2021-02-28 NOTE — Progress Notes (Signed)
Pharmacy Antibiotic Note  Blake Cherry is a 76 y.o. male admitted on 03/14/2021 with pneumonia.  Pharmacy has been consulted for Cefepime dosing. PCT <0.1, AF. COPD  Plan: Cefepime 2gm IV q12h F/U cxs and clinical progress Monitor V/S, labs  Height: 5\' 7"  (170.2 cm) Weight: 72.8 kg (160 lb 7.9 oz) IBW/kg (Calculated) : 66.1  Temp (24hrs), Avg:97.8 F (36.6 C), Min:97.3 F (36.3 C), Max:98 F (36.7 C)  Recent Labs  Lab 03/02/2021 0340 03/22/2021 1536 02/28/21 0445  WBC 18.1*  --  26.5*  CREATININE 1.83*  --  1.89*  LATICACIDVEN 1.3 3.7* 0.7    Estimated Creatinine Clearance: 32.1 mL/min (A) (by C-G formula based on SCr of 1.89 mg/dL (H)).    No Known Allergies  Antimicrobials this admission: Cefepime  1/6 >>  azithromycin 1/6 >>   Microbiology results: 1/6 BCx: pending  Thank you for allowing pharmacy to be a part of this patients care.  Isac Sarna, BS Pharm D, [place] Clinical Pharmacist Pager [phone] 02/28/2021 8:06 AM

## 2021-02-28 NOTE — Progress Notes (Signed)
Peripherally Inserted Central Catheter Placement  The IV Nurse has discussed with the patient and/or persons authorized to consent for the patient, the purpose of this procedure and the potential benefits and risks involved with this procedure.  The benefits include less needle sticks, lab draws from the catheter, and the patient may be discharged home with the catheter. Risks include, but not limited to, infection, bleeding, blood clot (thrombus formation), and puncture of an artery; nerve damage and irregular heartbeat and possibility to perform a PICC exchange if needed/ordered by physician.  Alternatives to this procedure were also discussed.  Bard Power PICC patient education guide, fact sheet on infection prevention and patient information card has been provided to patient /or left at bedside.    PICC Placement Documentation  PICC Triple Lumen 02/28/21 PICC Right Brachial 39 cm 0 cm (Active)  Indication for Insertion or Continuance of Line Administration of hyperosmolar/irritating solutions (i.e. TPN, Vancomycin, etc.) 02/28/21 1224  Exposed Catheter (cm) 0 cm 02/28/21 1224  Site Assessment Dry;Intact;Clean 02/28/21 1224  Lumen #1 Status Flushed;Blood return noted;Saline locked 02/28/21 1224  Lumen #2 Status Flushed;Blood return noted;Saline locked 02/28/21 1224  Lumen #3 Status Flushed;Blood return noted;Saline locked 02/28/21 1224  Dressing Type Transparent 02/28/21 1224  Dressing Status Dry;Intact;Clean 02/28/21 1224  Antimicrobial disc in place? Yes 02/28/21 1224  Dressing Change Due 03/07/21 02/28/21 1224       Audrie Gallus 02/28/2021, 12:26 PM

## 2021-02-28 NOTE — Sepsis Progress Note (Signed)
eLink is following this Code Sepsis. °

## 2021-02-28 NOTE — Progress Notes (Signed)
PROGRESS NOTE    Patient: Blake Cherry                            PCP: Kathyrn Drown, MD                    DOB: 04-09-1945            DOA: 03/10/2021 [id number]             DOS: 02/28/2021, 10:56 AM   LOS: 1 day   Date of Service: The patient was seen and examined on 02/28/2021  Subjective:   The patient was seen and examined, this morning, steep decline, mentally and physically.  Patient respiratory distress, hypoxic, lethargic   Stat ABG pH 7.122, PCO2 103 -patient was placed on BiPAP PCCM consulted Dr. Halford Chessman   Wife present at bedside: Expressing does not want patient to be on ventilator  Currently on BiPAP, IV Levophed drip  Brief Narrative:   Blake Cherry  is a 76 y.o. male, with history of abdominal aortic aneurysm, atrial fibrillation flutter, coronary artery disease, CKD, COPD, essential hypertension, stroke, SVT, and more presents to ED with chief complaint of dyspnea, cough, with new onset of lower extremity edema. Recent admission for pneumonia, recent evaluation for A. fib on Eliquis by cardiology. Recently started on Lasix by cardiology (approximately 6 days ago. Baseline O2 demand 4 L...   Assessment & Plan:   Principal Problem:   Sepsis ([place]) Active Problems:   Tobacco use disorder   Acute on chronic respiratory failure with hypoxia (HCC)   Pleural effusion   Mild protein-calorie malnutrition (HCC)   Elevated troponin   Acute toxic metabolic encephalopathy  -Exacerbated by hypoxia, hypotension, respiratory metabolic acidosis -WBC [id number] -Lactic acid 3.7 >> 0.7, Pro-Cal<0.10 -Initiating focus sepsis protocol-without aggressive IV fluid resuscitation (due to fluid retention, possible acute heart failure, elevated proBNP, volume overload) -Discussed with the wife at bedside agrees with medical treatment, does not want patient to be intubated   Sepsis/septic shock -Evidenced by: Toxic metabolic encephalopathy, hypoxic, hypotensive, leukocytosis,  tachycardic, tachypneic -Source of infection possibly pulmonary -Focus sepsis protocol initiated -Continue: BiPAP, Levophed drip, IV antibiotics -Follow-up blood cultures -Pulmonary critical care team consulted  Acute respiratory failure with hypoxia -Multifactorial with underlying COPD, possible new onset CHF, exacerbated A. Fib Chronic pleural effusion -Worsening respiratory failure, respiratory acidosis: -02/28/2021, ABG: On 5 L 7.1, PCO2 103, PO2 190, bicarb 26.1, -RT consulted, patient has been placed on BiPAP rate of 10, FiO2 40% Repeating ABG -PCCM, Dr. Halford Chessman consulted  Discussed plan of care with wife at bedside does not want the patient to be intubated at this time   -Baseline O2 demand 4 L, currently on 5 L of oxygen -Elevated proBNP -DuoNeb bronchodilator treatment -SARS-CoV-2, influenza A/B all negative -Continue treating underlying causes -Status post 2 dose of IV Lasix  History of A. fib with RVR, elevated troponin -A. fib in the setting of respiratory distress, and elevated troponin -Cardiology consulted, no ST elevation or depression, or significant changes EKG, rate 102, -Holding Cardizem, continuing Eliquis, Multaq -DC'd by cardiology Cardiology initiating 3 dose of IV digoxin 0.25 mg   03/2020 echo LVEF 123456, indet diastolic fxn, normal RV function, mod LAE  Lower extremity edema -Elevated proBNP 788,, chest x-ray with no signs of congestion, reviewed repeat echo -Continue Lasix if BP stabilized -Ruling out acute congestive heart failure 03/2020 echo LVEF 123456, indet diastolic fxn, normal  RV function, mod LAE -Appreciate cardiology input  Chronic pleural effusion -With history of prior thoracentesis, reattempting ultrasound-guided thoracentesis reported minimal to drain -Continue with IV Lasix  Hypotensive  -Seems to be chronic previously systolic blood pressures were recorded at 90s -In the setting of IV Lasix, respite distress -With a history of  underlying cardiovascular, dysrhythmia, avoiding inotropic's -We will initiate midodrine    COPD -Questionable exacerbation, status post 1 dose of IV steroids, continue DuoNeb bronchodilators, Pulmicort -Continue supplemental oxygen (baseline 4 L) currently on 5 L  Hyperlipidemia -Continue statin  History of chronic anemia -H&H stable, continuing Eliquis -Outpatient follow-up with gastroenterologist for further evaluation -Received 1 unit of PRBC on December 2022  CKD stage IIIa -Baseline creatinine 1.46, currently 1.83, BUN 28  --------------------------------------------------------------------------------------------------------------------------------------------- Cultures; None  Antimicrobials: None   Consultants: Cardiology   ------------------------------------------------------------------------------------------------------------------------------------------------  DVT prophylaxis:  SCD/Compression stockings and [id number] Code Status:   Code Status: Partial Code  Family Communication: Discussed with patient and his wife at bedside The above findings and plan of care has been discussed with patient (and family)  in detail,  they expressed understanding and agreement of above. -Advance care planning has been discussed.   Admission status:   Status is: Inpatient  Remains inpatient appropriate because: Continue to require IV diuretics, IV medication, along with p.o. stabilizing volume heart rate breathing treatments      Level of care: ICU   Procedures:   No admission procedures for hospital encounter.    Antimicrobials:  Anti-infectives (From admission, onward)    Start     Dose/Rate Route Frequency Ordered Stop   02/28/21 0830  ceFEPIme (MAXIPIME) 2 g in sodium chloride 0.9 % 100 mL IVPB        2 g 200 mL/hr over 30 Minutes Intravenous Every 12 hours 02/28/21 0758     02/28/21 0815  ceFEPIme (MAXIPIME) 2 g in sodium chloride 0.9 % 100 mL  IVPB  Status:  Discontinued        2 g 200 mL/hr over 30 Minutes Intravenous  Once 02/28/21 0724 02/28/21 0758   02/28/21 0800  azithromycin (ZITHROMAX) 500 mg in sodium chloride 0.9 % 250 mL IVPB        500 mg 250 mL/hr over 60 Minutes Intravenous Every 24 hours 02/28/21 0724 03/05/21 0759        Medication:   amiodarone  150 mg Intravenous Once   apixaban  5 mg Oral BID   budesonide  0.5 mg Nebulization BID   Chlorhexidine Gluconate Cloth  6 each Topical Daily   furosemide  60 mg Intravenous BID   ipratropium  0.5 mg Nebulization TID   levalbuterol  1.25 mg Nebulization TID   rosuvastatin  5 mg Oral Daily    acetaminophen **OR** acetaminophen, haloperidol lactate, melatonin, ondansetron **OR** ondansetron (ZOFRAN) IV, oxyCODONE   Objective:   Vitals:   02/28/21 0630 02/28/21 0645 02/28/21 0700 02/28/21 0852  BP: 121/64 120/70 121/68   Pulse: (!) 126 (!) 125 (!) 126   Resp: 20 (!) 22 20   Temp:      TempSrc:      SpO2: 100% 100% 100% 94%  Weight:      Height:        Intake/Output Summary (Last 24 hours) at 02/28/2021 1056 Last data filed at 02/28/2021 0607 Gross per 24 hour  Intake 543.19 ml  Output 2050 ml  Net -1506.81 ml   Filed Weights   02/23/2021 0330 03/21/2021 0959 02/28/21 0500  Weight: 74.8  kg 75.2 kg 72.8 kg     Examination:   Physical Exam  Constitution:  C/O SOB, but Alert, cooperative, no distress,   Psychiatric: Normal and stable mood and affect, cognition intact,   HEENT: Normocephalic, PERRL, otherwise with in Normal limits  Chest:Chest symmetric Cardio vascular:  S1/S2, RRR, No murmure, No Rubs or Gallops  pulmonary: On 4 L oxygen, satting 97%  Diffuse Rales, mild rhonchi -mild-moderate respirations labored, negative wheezes /mild lower lobe crackles Abdomen: Soft, non-tender, non-distended, bowel sounds,no masses, no organomegaly Muscular skeletal: Limited exam - in bed, able to move all 4 extremities, Normal strength,  Neuro: CNII-XII  intact. , normal motor and sensation, reflexes intact  Extremities: ++2 pitting edema lower extremities, +2 pulses  Skin: Dry, warm to touch, negative for any Rashes, No open wounds Wounds: per nursing documentation    ------------------------------------------------------------------------------------------------------------------------------------------    LABs:  CBC Latest Ref Rng & Units 02/28/2021 03/12/2021 02/19/2021  WBC 4.0 - 10.5 K/uL 26.5(H) 18.1(H) 13.1(H)  Hemoglobin 13.0 - 17.0 g/dL 7.7(L) 8.5(L) 7.6(L)  Hematocrit 39.0 - 52.0 % 26.0(L) 28.9(L) 25.2(L)  Platelets 150 - 400 K/uL 480(H) 474(H) 450(H)   CMP Latest Ref Rng & Units 02/28/2021 03/09/2021 02/19/2021  Glucose 70 - 99 mg/dL 134(H) 98 160(H)  BUN 8 - 23 mg/dL 37(H) 28(H) 43(H)  Creatinine 0.61 - 1.24 mg/dL 1.89(H) 1.83(H) 1.63(H)  Sodium 135 - 145 mmol/L 140 139 137  Potassium 3.5 - 5.1 mmol/L 5.0 4.3 5.0  Chloride 98 - 111 mmol/L 97(L) 100 104  CO2 22 - 32 mmol/L 33(H) 33(H) 27  Calcium 8.9 - 10.3 mg/dL 8.6(L) 8.1(L) 8.2(L)  Total Protein 6.5 - 8.1 g/dL 6.2(L) 6.2(L) -  Total Bilirubin 0.3 - 1.2 mg/dL 0.5 0.3 -  Alkaline Phos 38 - 126 U/L 57 67 -  AST 15 - 41 U/L 32 24 -  ALT 0 - 44 U/L 21 28 -       Micro Results Recent Results (from the past 240 hour(s))  Resp Panel by RT-PCR (Flu A&B, Covid) Nasopharyngeal Swab     Status: None   Collection Time: 02/26/2021  3:40 AM   Specimen: Nasopharyngeal Swab; Nasopharyngeal(NP) swabs in vial transport medium  Result Value Ref Range Status   SARS Coronavirus 2 by RT PCR NEGATIVE NEGATIVE Final    Comment: (NOTE) SARS-CoV-2 target nucleic acids are NOT DETECTED.  The SARS-CoV-2 RNA is generally detectable in upper respiratory specimens during the acute phase of infection. The lowest concentration of SARS-CoV-2 viral copies this assay can detect is 138 copies/mL. A negative result does not preclude SARS-Cov-2 infection and should not be used as the sole basis for  treatment or other patient management decisions. A negative result may occur with  improper specimen collection/handling, submission of specimen other than nasopharyngeal swab, presence of viral mutation(s) within the areas targeted by this assay, and inadequate number of viral copies(<138 copies/mL). A negative result must be combined with clinical observations, patient history, and epidemiological information. The expected result is Negative.  Fact Sheet for Patients:  EntrepreneurPulse.com.au  Fact Sheet for Healthcare Providers:  IncredibleEmployment.be  This test is no t yet approved or cleared by the [place] FDA and  has been authorized for detection and/or diagnosis of SARS-CoV-2 by FDA under an Emergency Use Authorization (EUA). This EUA will remain  in effect (meaning this test can be used) for the duration of the COVID-19 declaration under Section 564(b)(1) of the Act, 21 U.S.C.section 360bbb-3(b)(1), unless the authorization is terminated  or revoked sooner.       Influenza A by PCR NEGATIVE NEGATIVE Final   Influenza B by PCR NEGATIVE NEGATIVE Final    Comment: (NOTE) The Xpert Xpress SARS-CoV-2/FLU/RSV plus assay is intended as an aid in the diagnosis of influenza from Nasopharyngeal swab specimens and should not be used as a sole basis for treatment. Nasal washings and aspirates are unacceptable for Xpert Xpress SARS-CoV-2/FLU/RSV testing.  Fact Sheet for Patients: EntrepreneurPulse.com.au  Fact Sheet for Healthcare Providers: IncredibleEmployment.be  This test is not yet approved or cleared by the [place] FDA and has been authorized for detection and/or diagnosis of SARS-CoV-2 by FDA under an Emergency Use Authorization (EUA). This EUA will remain in effect (meaning this test can be used) for the duration of the COVID-19 declaration under Section 564(b)(1) of the Act, 21  U.S.C. section 360bbb-3(b)(1), unless the authorization is terminated or revoked.  Performed at [facility], [address] [place], [place] [address]   Culture, blood (x 2)     Status: None (Preliminary result)   Collection Time: 02/28/21  7:45 AM   Specimen: Right Antecubital; Blood  Result Value Ref Range Status   Specimen Description RIGHT ANTECUBITAL  Final   Special Requests   Final    BOTTLES DRAWN AEROBIC AND ANAEROBIC Blood Culture adequate volume Performed at [facility], [address] [place], [place] [address]    Culture PENDING  Incomplete   Report Status PENDING  Incomplete  Culture, blood (x 2)     Status: None (Preliminary result)   Collection Time: 02/28/21  7:45 AM   Specimen: BLOOD RIGHT HAND  Result Value Ref Range Status   Specimen Description BLOOD RIGHT HAND  Final   Special Requests   Final    BOTTLES DRAWN AEROBIC AND ANAEROBIC Blood Culture results may not be optimal due to an excessive volume of blood received in culture bottles Performed at [facility], [address] [place], [place] [address]    Culture PENDING  Incomplete   Report Status PENDING  Incomplete    Radiology Reports DG Chest 1 View  Result Date: 02/13/2021 CLINICAL DATA:  RIGHT pleural effusion post pneumothorax EXAM: CHEST  1 VIEW COMPARISON:  Earlier study of 02/13/2021 FINDINGS: Enlargement of cardiac silhouette with vascular congestion. Atherosclerotic calcification aorta. Loculated LEFT pleural effusion unchanged. Decreased RIGHT pleural effusion and basilar atelectasis. Persistent pulmonary infiltrates. RIGHT apical scarring. No definite pneumothorax or acute osseous findings. IMPRESSION: No pneumothorax following RIGHT thoracentesis. Remainder of exam unchanged. Electronically Signed   By: Lavonia Dana M.D.   On: 02/13/2021 12:06   CT Angio Chest Pulmonary Embolism (PE) W or WO Contrast  Result Date: 02/11/2021 CLINICAL DATA:  Dyspnea, hypoxia, COPD EXAM: CT ANGIOGRAPHY CHEST  WITH CONTRAST TECHNIQUE: Multidetector CT imaging of the chest was performed using the standard protocol during bolus administration of intravenous contrast. Multiplanar CT image reconstructions and MIPs were obtained to evaluate the vascular anatomy. CONTRAST:  58mL OMNIPAQUE IOHEXOL 350 MG/ML SOLN COMPARISON:  01/28/2021 FINDINGS: Cardiovascular: There is adequate opacification of the pulmonary arterial tree. No intraluminal filling defect identified to suggest acute pulmonary embolism. The central pulmonary arteries are mildly enlarged in keeping with changes of pulmonary arterial hypertension. Extensive multi-vessel coronary artery calcification. Global cardiac size is within normal limits. No pericardial effusion. Moderate atherosclerotic calcification within the thoracic aorta. Small superimposed atheromatous plaque within the descending thoracic aorta without ulceration. No aortic aneurysm. Mediastinum/Nodes: Shotty precarinal adenopathy is stable. No additional pathologic thoracic adenopathy is  identified. Esophagus unremarkable. Thyroid unremarkable. Lungs/Pleura: Mild emphysema. Parenchymal scarring with rounded atelectasis noted within the basilar lingula and left-sided volume loss is again noted. Bibasilar cylindrical bronchiectasis again noted. Since the prior examination, there has developed scattered ground-glass infiltrate within the posterior segment of the right upper lobe and lateral segment of the right middle lobe. Additionally, there has developed progressive bronchial wall thickening and airway impaction. Together, the findings may reflect changes of superimposed acute inflammation or infection. Small right pleural effusion has developed. No pneumothorax. Upper Abdomen: No acute abnormality. Musculoskeletal: No chest wall abnormality. No acute or significant osseous findings. Review of the MIP images confirms the above findings. IMPRESSION: No pulmonary embolism. Interval development of  scattered parenchymal infiltrate, progressive bronchiolar inflammation, and scattered airway impaction suggesting a superimposed acute infectious or inflammatory process. Interval development of a small right pleural effusion. Mild emphysema. Extensive multi-vessel coronary artery calcification. Morphologic changes in keeping with pulmonary arterial hypertension. Aortic Atherosclerosis (ICD10-I70.0) and Emphysema (ICD10-J43.9). Electronically Signed   By: Fidela Salisbury M.D.   On: 02/11/2021 19:42   [place] CHEST (PLEURAL EFFUSION)  Result Date: 03/06/2021 CLINICAL DATA:  Pleural effusion, assessment for thoracentesis EXAM: CHEST ULTRASOUND COMPARISON:  Chest radiograph 02/28/2020 FINDINGS: Small loculated LEFT pleural effusion identified. Volume of fluid is insufficient for thoracentesis. IMPRESSION: Small loculated LEFT pleural effusion, insufficient for thoracentesis. Electronically Signed   By: Lavonia Dana M.D.   On: 03/13/2021 10:38   DG CHEST PORT 1 VIEW  Result Date: 02/28/2021 CLINICAL DATA:  Shortness of breath EXAM: PORTABLE CHEST 1 VIEW COMPARISON:  Previous day FINDINGS: Stable appearance of the cardiomediastinal silhouette. Calcifications of the aortic arch. Similar loculated appearance of left pleural effusion. Left lower lung aeration appears slightly improved. No significant right effusion noted. Similar appearance of dense right upper lobe consolidation. Worsening right lower lobe heterogeneous airspace opacities. IMPRESSION: 1. Worsening right lower lobe heterogeneous airspace opacities. Stable appearance of right upper lobe consolidation. 2. Similar appearance of loculated left pleural effusion. Left lower lung aeration appears slightly improved. Electronically Signed   By: Albin Felling M.D.   On: 02/28/2021 08:42   DG Chest Port 1 View  Result Date: 03/01/2021 CLINICAL DATA:  76 year old male with shortness of breath. EXAM: PORTABLE CHEST 1 VIEW COMPARISON:  Portable chest 02/16/2021 and  earlier. FINDINGS: Portable AP upright view at 0349 hours. Chronic loculated left pleural effusion with associated areas of left lung round atelectasis. Right upper lobe consolidation is new since 02/11/2021, but ongoing from the end of last month and has not significantly improved. Stable cardiac size and mediastinal contours. Stable trachea. No superimposed pneumothorax or pulmonary edema. No acute osseous abnormality identified. IMPRESSION: 1. Right upper lobe consolidation first present on 02/13/2021 has not resolved. 2. Chronic loculated left pleural effusion with left lung atelectasis. 3. No new cardiopulmonary abnormality. Electronically Signed   By: Genevie Ann M.D.   On: 03/02/2021 04:04   DG CHEST PORT 1 VIEW  Result Date: 02/16/2021 CLINICAL DATA:  Acute on chronic respiratory failure EXAM: PORTABLE CHEST 1 VIEW COMPARISON:  02/13/2021 FINDINGS: Patchy bilateral airspace disease again noted, unchanged. Loculated left pleural effusion, stable. Heart is normal size. Aortic atherosclerosis. No acute bony abnormality. IMPRESSION: Stable patchy bilateral airspace disease and loculated left pleural effusion Electronically Signed   By: Rolm Baptise M.D.   On: 02/16/2021 05:14   DG CHEST PORT 1 VIEW  Result Date: 02/13/2021 CLINICAL DATA:  Shortness of breath, chest pain EXAM: PORTABLE CHEST 1 VIEW COMPARISON:  02/11/2021  FINDINGS: No significant change in AP portable chest radiograph with a moderate, loculated appearing left pleural effusion and a small right pleural effusion. Unchanged diffuse bilateral interstitial pulmonary opacity. Cardiomegaly. IMPRESSION: 1. No significant change in AP portable chest radiograph with a moderate, loculated appearing left pleural effusion and a small right pleural effusion. 2. Unchanged diffuse bilateral interstitial pulmonary opacity, consistent with edema or infection. 3.  Cardiomegaly. Electronically Signed   By: Delanna Ahmadi M.D.   On: 02/13/2021 08:52   DG Chest  Port 1 View  Result Date: 02/11/2021 CLINICAL DATA:  Dyspnea, COPD, smoker EXAM: PORTABLE CHEST 1 VIEW COMPARISON:  01/28/2021 chest radiograph. FINDINGS: Stable cardiomediastinal silhouette with top normal heart size. No pneumothorax. Loculated small to moderate lateral basilar left pleural effusion/thickening, mildly increased. Slight blunting of the right costophrenic angle. Cephalization of the pulmonary vasculature without overt pulmonary edema. Patchy and streaky opacities in the mid to lower left lung appear unchanged. IMPRESSION: 1. Loculated small to moderate lateral basilar left pleural effusion/thickening, mildly increased. 2. Chronic patchy and streaky opacities in the mid to lower left lung, favor nonspecific scarring. 3. Slight blunting of the right costophrenic angle, possible small right pleural effusion. Electronically Signed   By: Ilona Sorrel M.D.   On: 02/11/2021 11:26   ECHOCARDIOGRAM COMPLETE  Result Date: 03/16/2021    ECHOCARDIOGRAM REPORT   Patient Name:   MACKLYN PARPART Date of Exam: 03/07/2021 Medical Rec #:  [id number]        Height:       67.0 in Accession #:    [id number]       Weight:       165.8 lb Date of Birth:  [birth year]         BSA:          1.867 m Patient Age:    76 years         BP:           91/56 mmHg Patient Gender: M                HR:           126 bpm. Exam Location:  [facility] Procedure: 2D Echo, Cardiac Doppler and Color Doppler Indications:    CHF  History:        Patient has prior history of Echocardiogram examinations, most                 recent 04/02/2020. COPD and Stroke, Arrythmias:Atrial Flutter and                 Tachycardia, Signs/Symptoms:Chest Pain; Risk                 Factors:Hypertension, Dyslipidemia and Current Smoker.  Sonographer:    Wenda Low Referring Phys: [id number] ASIA B [place]  1. The apex is akinetic. The mid to distal anterolateral, inferoseptal, anterior, anteroseptal, inferior walls appear hypokinetic. Pattern  could be suggesting of a stress induced cardiomyopathy. . Left ventricular ejection fraction, by estimation, is 30  to 35%. The left ventricle has moderately decreased function. The left ventricle demonstrates regional wall motion abnormalities (see scoring diagram/findings for description). There is mild left ventricular hypertrophy. Left ventricular diastolic parameters are indeterminate.  2. Right ventricular systolic function is normal. The right ventricular size is normal. There is mildly elevated pulmonary artery systolic pressure.  3. Left atrial size was mild to moderately dilated.  4. Right atrial size was mildly dilated.  5. The mitral  valve is normal in structure. Trivial mitral valve regurgitation. No evidence of mitral stenosis.  6. The tricuspid valve is abnormal.  7. The aortic valve is tricuspid. There is mild calcification of the aortic valve. There is mild thickening of the aortic valve. Aortic valve regurgitation is not visualized. No aortic stenosis is present.  8. The inferior vena cava is normal in size with greater than 50% respiratory variability, suggesting right atrial pressure of 3 mmHg. FINDINGS  Left Ventricle: The apex is akinetic. The mid to distal anterolateral, inferoseptal, anterior, anteroseptal, inferior walls appear hypokinetic. Pattern could be suggesting of a stress induced cardiomyopathy. Left ventricular ejection fraction, by estimation, is 30 to 35%. The left ventricle has moderately decreased function. The left ventricle demonstrates regional wall motion abnormalities. Definity contrast agent was given IV to delineate the left ventricular endocardial borders. The left ventricular internal cavity size was normal in size. There is mild left ventricular hypertrophy. Left ventricular diastolic parameters are indeterminate. Right Ventricle: The right ventricular size is normal. No increase in right ventricular wall thickness. Right ventricular systolic function is normal. There  is mildly elevated pulmonary artery systolic pressure. The tricuspid regurgitant velocity is 3.00  m/s, and with an assumed right atrial pressure of 8 mmHg, the estimated right ventricular systolic pressure is 123XX123 mmHg. Left Atrium: Left atrial size was mild to moderately dilated. Right Atrium: Right atrial size was mildly dilated. Pericardium: There is no evidence of pericardial effusion. Mitral Valve: The mitral valve is normal in structure. There is mild thickening of the mitral valve leaflet(s). There is mild calcification of the mitral valve leaflet(s). Mild mitral annular calcification. Trivial mitral valve regurgitation. No evidence  of mitral valve stenosis. MV peak gradient, 9.2 mmHg. The mean mitral valve gradient is 3.0 mmHg. Tricuspid Valve: The tricuspid valve is abnormal. Tricuspid valve regurgitation is mild . No evidence of tricuspid stenosis. Aortic Valve: The aortic valve is tricuspid. There is mild calcification of the aortic valve. There is mild thickening of the aortic valve. There is mild aortic valve annular calcification. Aortic valve regurgitation is not visualized. No aortic stenosis  is present. Aortic valve mean gradient measures 3.0 mmHg. Aortic valve peak gradient measures 5.4 mmHg. Aortic valve area, by VTI measures 2.62 cm. Pulmonic Valve: The pulmonic valve was not well visualized. Pulmonic valve regurgitation is not visualized. No evidence of pulmonic stenosis. Aorta: The aortic root is normal in size and structure. Venous: The inferior vena cava is normal in size with greater than 50% respiratory variability, suggesting right atrial pressure of 3 mmHg. IAS/Shunts: No atrial level shunt detected by color flow Doppler.  LEFT VENTRICLE PLAX 2D LVIDd:         5.00 cm   Diastology LVIDs:         3.10 cm   LV e' medial:    5.98 cm/s LV PW:         1.20 cm   LV E/e' medial:  26.9 LV IVS:        1.20 cm   LV e' lateral:   9.90 cm/s LVOT diam:     2.00 cm   LV E/e' lateral: 16.3 LV SV:          62 LV SV Index:   33 LVOT Area:     3.14 cm  RIGHT VENTRICLE RV Basal diam:  3.45 cm RV Mid diam:    3.10 cm RV S prime:     14.70 cm/s TAPSE (M-mode): 1.8 cm LEFT ATRIUM  Index        RIGHT ATRIUM           Index LA diam:        4.70 cm 2.52 cm/m   RA Area:     20.60 cm LA Vol (A2C):   81.5 ml 43.65 ml/m  RA Volume:   64.60 ml  34.60 ml/m LA Vol (A4C):   80.6 ml 43.16 ml/m LA Biplane Vol: 82.0 ml 43.91 ml/m  AORTIC VALVE                    PULMONIC VALVE AV Area (Vmax):    2.48 cm     PV Vmax:       0.69 m/s AV Area (Vmean):   2.41 cm     PV Peak grad:  1.9 mmHg AV Area (VTI):     2.62 cm AV Vmax:           116.00 cm/s AV Vmean:          81.700 cm/s AV VTI:            0.235 m AV Peak Grad:      5.4 mmHg AV Mean Grad:      3.0 mmHg LVOT Vmax:         91.60 cm/s LVOT Vmean:        62.700 cm/s LVOT VTI:          0.196 m LVOT/AV VTI ratio: 0.83  AORTA Ao Root diam: 3.10 cm Ao Asc diam:  3.00 cm MITRAL VALVE                TRICUSPID VALVE MV Area (PHT): 3.91 cm     TR Peak grad:   36.0 mmHg MV Area VTI:   1.89 cm     TR Vmax:        300.00 cm/s MV Peak grad:  9.2 mmHg MV Mean grad:  3.0 mmHg     SHUNTS MV Vmax:       1.52 m/s     Systemic VTI:  0.20 m MV Vmean:      66.6 cm/s    Systemic Diam: 2.00 cm MV Decel Time: 194 msec MV E velocity: 161.00 cm/s Carlyle Dolly MD Electronically signed by Carlyle Dolly MD Signature Date/Time: 03/13/2021/4:25:41 PM    Final    [place] EKG SITE RITE  Result Date: 02/28/2021 If Site Rite image not attached, placement could not be confirmed due to current cardiac rhythm.  [place] EKG SITE RITE  Result Date: 03/06/2021 If Site Rite image not attached, placement could not be confirmed due to current cardiac rhythm.  US THORACENTESIS ASP PLEURAL SPACE W/IMG GUIDE  Result Date: 02/13/2021 INDICATION: Moderate right pleural effusion Very small- left effusion EXAM: ULTRASOUND GUIDED right THORACENTESIS MEDICATIONS: 10 cc 1% lidocaine. COMPLICATIONS: None  immediate. PROCEDURE: An ultrasound guided thoracentesis was thoroughly discussed with the patient and questions answered. The benefits, risks, alternatives and complications were also discussed. The patient understands and wishes to proceed with the procedure. Written consent was obtained. Ultrasound was performed to localize and mark an adequate pocket of fluid in the right chest. The area was then prepped and draped in the normal sterile fashion. 1% Lidocaine was used for local anesthesia. Under ultrasound guidance a Yueh catheter was introduced. Thoracentesis was performed. The catheter was removed and a dressing applied. FINDINGS: A total of approximately 600 cc of yellow fluid was removed. Samples were sent to the laboratory as requested  by the clinical team. IMPRESSION: Successful ultrasound guided right thoracentesis yielding 600 cc yellow fluid of pleural fluid. CXR: No ptx per Dr Thornton Papas Read by Lavonia Drafts [facility] Electronically Signed   By: Lavonia Dana M.D.   On: 02/13/2021 13:18    SIGNED: Deatra James, MD, FHM. [facility],  Pager (please use [email or web address] to page/text) Please use Epic Secure Chat for non-urgent communication (7AM-7PM)  > 77 minutes of critical care time was spent in stabilizing this patient, reviewing all medical record seen and examined the patient, discussing current plan of care with cardiology, patient and family  .    if 7PM-7AM, please contact night-coverage [email or web address], 02/28/2021, 10:56 AM

## 2021-02-28 NOTE — Progress Notes (Signed)
CRITICAL VALUE STICKER  CRITICAL VALUE: pH 7.122, pCO2 103  MD NOTIFIED: Dr. Flossie Dibble  TIME OF NOTIFICATION: [phone]

## 2021-02-28 NOTE — Plan of Care (Signed)

## 2021-02-28 NOTE — Progress Notes (Signed)
Addendum:  Patient was seen and examined again, PICC line was placed, remained on BiPAP. Patient remain encephalopathic, in respiratory failure, hypotensive with A. fib with RVR.    Discussed all findings, plan of care and management with PCCM Dr. Craige Cotta... Patient is known to Dr. Craige Cotta.  This is noted to be steep decline from patient from recent admission and treatment for pneumonia.  Noted for acute cardiovascular decline.  Patient suffering from cardiopulmonary syndrome, possibly cardiogenic shock.  Currently respiratory failure, tachypneic, tachycardic, hypoxic, hypotensive.  Overall discussion based a poor prognosis, passing away from the above is eminent within this admission in the hospital.   On BiPAP/IV Levophed, IV amiodarone, as needed Haldol  All findings plan of care and patient's current status was discussed with the patient's wife  at the bedside in detail... All consultants are on board, patient's wife agreed to DNR/DNI status Mrs. Lurlean Horns She stressed understanding and would like current treatment to continue with no CPR or intubation.... No further aggressive treatment.   Anticipating patient to decline and passed away from the above complication within next 24 hours.   CODE STATUS: DNR/DNI  Continuing amiodarone, Levophed, BiPAP...      SIGNED: Kendell Bane, MD, FHM. [facility],  Pager (please use [email or web address] to page/text)  Please use Epic Secure Chat for non-urgent communication (7AM-7PM) If 7PM-7AM, please contact night-coverage [email or web address],  02/28/2021, 2:32 PM

## 2021-03-01 DIAGNOSIS — R6521 Severe sepsis with septic shock: Secondary | ICD-10-CM | POA: Diagnosis not present

## 2021-03-01 DIAGNOSIS — A419 Sepsis, unspecified organism: Secondary | ICD-10-CM | POA: Diagnosis not present

## 2021-03-01 LAB — COMPREHENSIVE METABOLIC PANEL
ALT: 92 U/L — ABNORMAL HIGH (ref 0–44)
AST: 184 U/L — ABNORMAL HIGH (ref 15–41)
Albumin: 3.6 g/dL (ref 3.5–5.0)
Alkaline Phosphatase: 50 U/L (ref 38–126)
Anion gap: 11 (ref 5–15)
BUN: 56 mg/dL — ABNORMAL HIGH (ref 8–23)
CO2: 33 mmol/L — ABNORMAL HIGH (ref 22–32)
Calcium: 8 mg/dL — ABNORMAL LOW (ref 8.9–10.3)
Chloride: 96 mmol/L — ABNORMAL LOW (ref 98–111)
Creatinine, Ser: 2.39 mg/dL — ABNORMAL HIGH (ref 0.61–1.24)
GFR, Estimated: 28 mL/min — ABNORMAL LOW (ref >60)
Glucose, Bld: 114 mg/dL — ABNORMAL HIGH (ref 70–99)
Potassium: 4.7 mmol/L (ref 3.5–5.1)
Sodium: 140 mmol/L (ref 135–145)
Total Bilirubin: 0.4 mg/dL (ref 0.3–1.2)
Total Protein: 6 g/dL — ABNORMAL LOW (ref 6.5–8.1)

## 2021-03-01 LAB — CBC
HCT: 26.3 % — ABNORMAL LOW (ref 39.0–52.0)
Hemoglobin: 7.4 g/dL — ABNORMAL LOW (ref 13.0–17.0)
MCH: 28 pg (ref 26.0–34.0)
MCHC: 28.1 g/dL — ABNORMAL LOW (ref 30.0–36.0)
MCV: 99.6 fL (ref 80.0–100.0)
Platelets: 331 K/uL (ref 150–400)
RBC: 2.64 MIL/uL — ABNORMAL LOW (ref 4.22–5.81)
RDW: 16.1 % — ABNORMAL HIGH (ref 11.5–15.5)
WBC: 16.5 K/uL — ABNORMAL HIGH (ref 4.0–10.5)
nRBC: 0 % (ref 0.0–0.2)

## 2021-03-01 LAB — BRAIN NATRIURETIC PEPTIDE: B Natriuretic Peptide: 2286 pg/mL — ABNORMAL HIGH (ref 0.0–100.0)

## 2021-03-01 MED ORDER — FUROSEMIDE 10 MG/ML IJ SOLN
20.0000 mg | Freq: Two times a day (BID) | INTRAMUSCULAR | Status: DC
  Administered 2021-03-02 (×2): 20 mg via INTRAVENOUS
  Filled 2021-03-01 (×2): qty 2

## 2021-03-01 MED ORDER — ORAL CARE MOUTH RINSE
15.0000 mL | Freq: Two times a day (BID) | OROMUCOSAL | Status: DC
  Administered 2021-03-01 – 2021-03-03 (×5): 15 mL via OROMUCOSAL

## 2021-03-01 MED ORDER — MORPHINE SULFATE (PF) 2 MG/ML IV SOLN
1.0000 mg | INTRAVENOUS | Status: DC | PRN
  Administered 2021-03-01 – 2021-03-02 (×6): 1 mg via INTRAVENOUS
  Filled 2021-03-01 (×6): qty 1

## 2021-03-01 MED ORDER — CHLORHEXIDINE GLUCONATE 0.12 % MT SOLN
15.0000 mL | Freq: Two times a day (BID) | OROMUCOSAL | Status: DC
  Administered 2021-03-01 – 2021-03-03 (×4): 15 mL via OROMUCOSAL
  Filled 2021-03-01 (×3): qty 15

## 2021-03-01 MED ORDER — SODIUM CHLORIDE 0.9 % IV SOLN
2.0000 g | INTRAVENOUS | Status: DC
  Administered 2021-03-01 – 2021-03-02 (×2): 2 g via INTRAVENOUS
  Filled 2021-03-01 (×2): qty 2

## 2021-03-01 NOTE — Progress Notes (Signed)
PROGRESS NOTE    Patient: Blake Cherry                            PCP: Kathyrn Drown, MD                    DOB: 1945-03-08            DOA: 03/09/2021 [id number]             DOS: 03/01/2021, 11:17 AM   LOS: 2 days   Date of Service: The patient was seen and examined on 03/01/2021  Subjective:  The patient was seen and examined this morning, bit more awake, still on BiPAP Blood pressure remained soft. Was aggressively diuresed by cardiologist I's and O's -1.5 Change in creatinine noted Still in A. fib with RVR on amiodarone drip  Currently on BiPAP, IV Levophed drip  Brief Narrative:   Blake Cherry  is a 76 y.o. male, with history of abdominal aortic aneurysm, atrial fibrillation flutter, coronary artery disease, CKD, COPD, essential hypertension, stroke, SVT, and more presents to ED with chief complaint of dyspnea, cough, with new onset of lower extremity edema. Recent admission for pneumonia, recent evaluation for A. fib on Eliquis by cardiology. Recently started on Lasix by cardiology (approximately 6 days ago. Baseline O2 demand 4 L...   Assessment & Plan:   Principal Problem:   Severe sepsis with septic shock (HCC) Active Problems:   Sepsis ([place])   Acute on chronic respiratory failure with hypoxia (HCC)   Hypotensive episode   Toxic metabolic encephalopathy   Tobacco use disorder   Pleural effusion   Mild protein-calorie malnutrition (HCC)   Elevated troponin   Acute toxic metabolic encephalopathy  -But more awake, following some commands, still confused  -Exacerbated by hypoxia, hypotension, respiratory metabolic acidosis -WBC [id number] >> 16.5 -Lactic acid 3.7 >> 0.7, Pro-Cal<0.10 -Initiating focus sepsis protocol-without aggressive IV fluid resuscitation (due to fluid retention, possible acute heart failure, elevated proBNP, volume overload) -Plan of care has been discussed with the patient wife and PCCM Dr. Halford Chessman There is an acute decline, anticipating poor  prognosis possibly inhospital death   Sepsis/septic shock/ PNA -Evidenced by: Toxic metabolic encephalopathy, hypoxic, hypotensive, leukocytosis, tachycardic, tachypneic -Source of infection possibly pulmonary -PNA -Sepsis septic shock pathway -Continue: BiPAP, Levophed drip, IV antibiotics -Follow-up blood cultures -Discussed with pulmonary, PCCM, believe this is more cardiogenic than sepsis Nevertheless continue to treat like sepsis and septic shock as patient meets the criteria  -Continuing IV Solu-Cortef due to low cortisol level (acute respiratory failure, COPD, septic shock, sepsis, hypotension)  Acute respiratory failure with hypoxia -Multifactorial with underlying COPD, possible PNA,  cardiogenic shock- new onset CHF with acute decline ejection fraction, exacerbated A. Fib Chronic pleural effusion -Respiratory failure, with respiratory acidosis: -02/28/2021, ABG: On 5 L 7.1, PCO2 103, PO2 190, bicarb 26.1, -RT consulted, patient has been placed on BiPAP rate of 10, FiO2 40% Repeating ABG as needed -PCCM, Dr. Halford Chessman consulted-recommended no aggressive measures continue current steroids DuoNeb BiPAP... May not blood gases  Patient wife at bedside--agrees with current plan including the patient and his wife request of no aggressive treatment including intubation  -Baseline O2 demand 4 L, -Elevated proBNP -DuoNeb bronchodilator treatment -SARS-CoV-2, influenza A/B all negative -Continue treating underlying causes -Status post 2 dose of IV Lasix   History of A. fib with RVR, elevated troponin -A. fib in the setting of respiratory distress, and elevated  troponin -Cardiology consulted, no ST elevation or depression, or significant changes EKG, rate 102, -Holding Cardizem, continuing Eliquis, Multaq -DC'd by cardiology Cardiology initiating 3 dose of IV digoxin 0.25 mg   03/2020 echo LVEF 123456, indet diastolic fxn, normal RV function, mod LAE  Acute systolic congestive heart  failure /  -with lower extremity edema, elevated proBNP - Acute drop in ejection fraction 30-35% based on Echo -Elevated proBNP 788,, chest x-ray with no signs of congestion, reviewed repeat echo -Was treated aggressively IV Lasix 60 twice daily on 02/28/2021, acute incline in creatinine, therefore but holding Lasix today resume at lower dose tomorrow  -Monitoring I's and O's -1.5 L Daily weight 72.8, 72.6  03/2020 echo LVEF 123456, indet diastolic fxn, normal RV function, mod LAE -Appreciate cardiology following  Chronic pleural effusion -With history of prior thoracentesis, reattempting ultrasound-guided thoracentesis reported minimal to drain IV Lasix -holding today  Hypotensive  -Seems to be chronic previously systolic blood pressures were recorded at 90s -In the setting of IV Lasix, respite distress -With a history of underlying cardiovascular, dysrhythmia, avoiding inotropic's -We will initiate midodrine  Acute on chronic kidney disease stage IIIa -Baseline creatinine 1.46, currently 1.83, BUN 28 -Creatinine 1.83,  1.89, 2.39 today -Due to hypotension, sepsis, septic shock, aggressive diuresing -Avoiding nephrotoxins -Treating underlying causes, including blood pressure sepsis but holding diuretics today  COPD -Questionable exacerbation, status post 1 dose of IV steroids, continue DuoNeb bronchodilators, Pulmicort -Continue supplemental oxygen (baseline 4 L) currently on 5 L  Hyperlipidemia -Continue statin  History of chronic anemia -H&H stable, continuing Eliquis -Outpatient follow-up with gastroenterologist for further evaluation -Received 1 unit of PRBC on December 2022   --------------------------------------------------------------------------------------------------------------------------------------------- Cultures; Blood cultures x2  Antimicrobials: Cefepime,   Consultants: Cardiology/PCCM  IV drips  amiodarone/Levophed/  ------------------------------------------------------------------------------------------------------------------------------------------------  DVT prophylaxis:  SCD/Compression stockings and [id number] Code Status:   Code Status: DNR/DNI  Family Communication: Discussed with patient and his wife at bedside The above findings and plan of care has been discussed with patient (and family)  in detail,  they expressed understanding and agreement of above. -Advance care planning has been discussed.   Admission status:   Status is: Inpatient  Remains inpatient appropriate because: Continue to require IV diuretics, IV medication, along with p.o. stabilizing volume heart rate breathing treatments      Level of care: ICU   Procedures:   No admission procedures for hospital encounter.    Antimicrobials:  Anti-infectives (From admission, onward)    Start     Dose/Rate Route Frequency Ordered Stop   03/01/21 2224  ceFEPIme (MAXIPIME) 2 g in sodium chloride 0.9 % 100 mL IVPB        2 g 200 mL/hr over 30 Minutes Intravenous Every 24 hours 03/01/21 0908     02/28/21 0830  ceFEPIme (MAXIPIME) 2 g in sodium chloride 0.9 % 100 mL IVPB  Status:  Discontinued        2 g 200 mL/hr over 30 Minutes Intravenous Every 12 hours 02/28/21 0758 03/01/21 0908   02/28/21 0815  ceFEPIme (MAXIPIME) 2 g in sodium chloride 0.9 % 100 mL IVPB  Status:  Discontinued        2 g 200 mL/hr over 30 Minutes Intravenous  Once 02/28/21 0724 02/28/21 0758   02/28/21 0800  azithromycin (ZITHROMAX) 500 mg in sodium chloride 0.9 % 250 mL IVPB        500 mg 250 mL/hr over 60 Minutes Intravenous Every 24 hours 02/28/21 0724 03/05/21 0759  Medication:   apixaban  5 mg Oral BID   budesonide  0.5 mg Nebulization BID   Chlorhexidine Gluconate Cloth  6 each Topical Daily   [START ON 03/02/2021] furosemide  20 mg Intravenous Q12H   hydrocortisone sod succinate (SOLU-CORTEF) inj  100 mg  Intravenous Q8H   ipratropium  0.5 mg Nebulization TID   levalbuterol  1.25 mg Nebulization TID   rosuvastatin  5 mg Oral Daily   sodium chloride flush  10-40 mL Intracatheter Q12H    acetaminophen **OR** acetaminophen, haloperidol lactate, melatonin, ondansetron **OR** ondansetron (ZOFRAN) IV, oxyCODONE, sodium chloride flush   Objective:   Vitals:   03/01/21 0900 03/01/21 1000 03/01/21 1018 03/01/21 1047  BP:   101/64   Pulse: (!) 131 (!) 131 (!) 131 (!) 132  Resp: (!) 21 (!) 21 (!) 21 20  Temp:      TempSrc:      SpO2: 98% 100% 100% 100%  Weight:      Height:        Intake/Output Summary (Last 24 hours) at 03/01/2021 1117 Last data filed at 03/01/2021 1023 Gross per 24 hour  Intake 969.85 ml  Output 900 ml  Net 69.85 ml   Filed Weights   03/07/2021 0959 02/28/21 0500 03/01/21 0500  Weight: 75.2 kg 72.8 kg 72.6 kg     Examination:   Physical Exam  Constitution:  C/O SOB, but Alert, cooperative, no distress,   Psychiatric: Normal and stable mood and affect, cognition intact,   HEENT: Normocephalic, PERRL, otherwise with in Normal limits  Chest:Chest symmetric Cardio vascular:  S1/S2, RRR, No murmure, No Rubs or Gallops  pulmonary: On 4 L oxygen, satting 97%  Diffuse Rales, mild rhonchi -mild-moderate respirations labored, negative wheezes /mild lower lobe crackles Abdomen: Soft, non-tender, non-distended, bowel sounds,no masses, no organomegaly Muscular skeletal: Limited exam - in bed, able to move all 4 extremities, Normal strength,  Neuro: CNII-XII intact. , normal motor and sensation, reflexes intact  Extremities: ++2 pitting edema lower extremities, +2 pulses  Skin: Dry, warm to touch, negative for any Rashes, No open wounds Wounds: per nursing documentation    ------------------------------------------------------------------------------------------------------------------------------------------    LABs:  CBC Latest Ref Rng & Units 03/01/2021 02/28/2021  03/22/2021  WBC 4.0 - 10.5 K/uL 16.5(H) 26.5(H) 18.1(H)  Hemoglobin 13.0 - 17.0 g/dL 7.4(L) 7.7(L) 8.5(L)  Hematocrit 39.0 - 52.0 % 26.3(L) 26.0(L) 28.9(L)  Platelets 150 - 400 K/uL 331 480(H) 474(H)   CMP Latest Ref Rng & Units 03/01/2021 02/28/2021 03/15/2021  Glucose 70 - 99 mg/dL 114(H) 134(H) 98  BUN 8 - 23 mg/dL 56(H) 37(H) 28(H)  Creatinine 0.61 - 1.24 mg/dL 2.39(H) 1.89(H) 1.83(H)  Sodium 135 - 145 mmol/L 140 140 139  Potassium 3.5 - 5.1 mmol/L 4.7 5.0 4.3  Chloride 98 - 111 mmol/L 96(L) 97(L) 100  CO2 22 - 32 mmol/L 33(H) 33(H) 33(H)  Calcium 8.9 - 10.3 mg/dL 8.0(L) 8.6(L) 8.1(L)  Total Protein 6.5 - 8.1 g/dL 6.0(L) 6.2(L) 6.2(L)  Total Bilirubin 0.3 - 1.2 mg/dL 0.4 0.5 0.3  Alkaline Phos 38 - 126 U/L 50 57 67  AST 15 - 41 U/L 184(H) 32 24  ALT 0 - 44 U/L 92(H) 21 28       Micro Results Recent Results (from the past 240 hour(s))  Resp Panel by RT-PCR (Flu A&B, Covid) Nasopharyngeal Swab     Status: None   Collection Time: 03/24/2021  3:40 AM   Specimen: Nasopharyngeal Swab; Nasopharyngeal(NP) swabs in vial transport medium  Result Value Ref Range Status   SARS Coronavirus 2 by RT PCR NEGATIVE NEGATIVE Final    Comment: (NOTE) SARS-CoV-2 target nucleic acids are NOT DETECTED.  The SARS-CoV-2 RNA is generally detectable in upper respiratory specimens during the acute phase of infection. The lowest concentration of SARS-CoV-2 viral copies this assay can detect is 138 copies/mL. A negative result does not preclude SARS-Cov-2 infection and should not be used as the sole basis for treatment or other patient management decisions. A negative result may occur with  improper specimen collection/handling, submission of specimen other than nasopharyngeal swab, presence of viral mutation(s) within the areas targeted by this assay, and inadequate number of viral copies(<138 copies/mL). A negative result must be combined with clinical observations, patient history, and  epidemiological information. The expected result is Negative.  Fact Sheet for Patients:  EntrepreneurPulse.com.au  Fact Sheet for Healthcare Providers:  IncredibleEmployment.be  This test is no t yet approved or cleared by the [place] FDA and  has been authorized for detection and/or diagnosis of SARS-CoV-2 by FDA under an Emergency Use Authorization (EUA). This EUA will remain  in effect (meaning this test can be used) for the duration of the COVID-19 declaration under Section 564(b)(1) of the Act, 21 U.S.C.section 360bbb-3(b)(1), unless the authorization is terminated  or revoked sooner.       Influenza A by PCR NEGATIVE NEGATIVE Final   Influenza B by PCR NEGATIVE NEGATIVE Final    Comment: (NOTE) The Xpert Xpress SARS-CoV-2/FLU/RSV plus assay is intended as an aid in the diagnosis of influenza from Nasopharyngeal swab specimens and should not be used as a sole basis for treatment. Nasal washings and aspirates are unacceptable for Xpert Xpress SARS-CoV-2/FLU/RSV testing.  Fact Sheet for Patients: EntrepreneurPulse.com.au  Fact Sheet for Healthcare Providers: IncredibleEmployment.be  This test is not yet approved or cleared by the [place] FDA and has been authorized for detection and/or diagnosis of SARS-CoV-2 by FDA under an Emergency Use Authorization (EUA). This EUA will remain in effect (meaning this test can be used) for the duration of the COVID-19 declaration under Section 564(b)(1) of the Act, 21 U.S.C. section 360bbb-3(b)(1), unless the authorization is terminated or revoked.  Performed at [facility], [address], [place], [place] [address]   Culture, blood (x 2)     Status: None (Preliminary result)   Collection Time: 02/28/21  7:45 AM   Specimen: Right Antecubital; Blood  Result Value Ref Range Status   Specimen Description RIGHT ANTECUBITAL  Final   Special Requests    Final    BOTTLES DRAWN AEROBIC AND ANAEROBIC Blood Culture adequate volume   Culture   Final    NO GROWTH < 24 HOURS Performed at [facility], [address], [place], [place] [address]    Report Status PENDING  Incomplete  Culture, blood (x 2)     Status: None (Preliminary result)   Collection Time: 02/28/21  7:45 AM   Specimen: BLOOD RIGHT HAND  Result Value Ref Range Status   Specimen Description BLOOD RIGHT HAND  Final   Special Requests   Final    BOTTLES DRAWN AEROBIC AND ANAEROBIC Blood Culture results may not be optimal due to an excessive volume of blood received in culture bottles   Culture   Final    NO GROWTH < 24 HOURS Performed at [facility], [address] [place], [place] [address]    Report Status PENDING  Incomplete    Radiology Reports DG Chest 1 View  Result Date: 02/13/2021 CLINICAL DATA:  RIGHT pleural effusion post pneumothorax EXAM: CHEST  1 VIEW COMPARISON:  Earlier study of 02/13/2021 FINDINGS: Enlargement of cardiac silhouette with vascular congestion. Atherosclerotic calcification aorta. Loculated LEFT pleural effusion unchanged. Decreased RIGHT pleural effusion and basilar atelectasis. Persistent pulmonary infiltrates. RIGHT apical scarring. No definite pneumothorax or acute osseous findings. IMPRESSION: No pneumothorax following RIGHT thoracentesis. Remainder of exam unchanged. Electronically Signed   By: Lavonia Dana M.D.   On: 02/13/2021 12:06   CT Angio Chest Pulmonary Embolism (PE) W or WO Contrast  Result Date: 02/11/2021 CLINICAL DATA:  Dyspnea, hypoxia, COPD EXAM: CT ANGIOGRAPHY CHEST WITH CONTRAST TECHNIQUE: Multidetector CT imaging of the chest was performed using the standard protocol during bolus administration of intravenous contrast. Multiplanar CT image reconstructions and MIPs were obtained to evaluate the vascular anatomy. CONTRAST:  81mL OMNIPAQUE IOHEXOL 350 MG/ML SOLN COMPARISON:  01/28/2021 FINDINGS: Cardiovascular: There is adequate  opacification of the pulmonary arterial tree. No intraluminal filling defect identified to suggest acute pulmonary embolism. The central pulmonary arteries are mildly enlarged in keeping with changes of pulmonary arterial hypertension. Extensive multi-vessel coronary artery calcification. Global cardiac size is within normal limits. No pericardial effusion. Moderate atherosclerotic calcification within the thoracic aorta. Small superimposed atheromatous plaque within the descending thoracic aorta without ulceration. No aortic aneurysm. Mediastinum/Nodes: Shotty precarinal adenopathy is stable. No additional pathologic thoracic adenopathy is identified. Esophagus unremarkable. Thyroid unremarkable. Lungs/Pleura: Mild emphysema. Parenchymal scarring with rounded atelectasis noted within the basilar lingula and left-sided volume loss is again noted. Bibasilar cylindrical bronchiectasis again noted. Since the prior examination, there has developed scattered ground-glass infiltrate within the posterior segment of the right upper lobe and lateral segment of the right middle lobe. Additionally, there has developed progressive bronchial wall thickening and airway impaction. Together, the findings may reflect changes of superimposed acute inflammation or infection. Small right pleural effusion has developed. No pneumothorax. Upper Abdomen: No acute abnormality. Musculoskeletal: No chest wall abnormality. No acute or significant osseous findings. Review of the MIP images confirms the above findings. IMPRESSION: No pulmonary embolism. Interval development of scattered parenchymal infiltrate, progressive bronchiolar inflammation, and scattered airway impaction suggesting a superimposed acute infectious or inflammatory process. Interval development of a small right pleural effusion. Mild emphysema. Extensive multi-vessel coronary artery calcification. Morphologic changes in keeping with pulmonary arterial hypertension. Aortic  Atherosclerosis (ICD10-I70.0) and Emphysema (ICD10-J43.9). Electronically Signed   By: Fidela Salisbury M.D.   On: 02/11/2021 19:42   [place] CHEST (PLEURAL EFFUSION)  Result Date: 03/07/2021 CLINICAL DATA:  Pleural effusion, assessment for thoracentesis EXAM: CHEST ULTRASOUND COMPARISON:  Chest radiograph 02/28/2020 FINDINGS: Small loculated LEFT pleural effusion identified. Volume of fluid is insufficient for thoracentesis. IMPRESSION: Small loculated LEFT pleural effusion, insufficient for thoracentesis. Electronically Signed   By: Lavonia Dana M.D.   On: 03/20/2021 10:38   DG CHEST PORT 1 VIEW  Result Date: 02/28/2021 CLINICAL DATA:  Shortness of breath EXAM: PORTABLE CHEST 1 VIEW COMPARISON:  Previous day FINDINGS: Stable appearance of the cardiomediastinal silhouette. Calcifications of the aortic arch. Similar loculated appearance of left pleural effusion. Left lower lung aeration appears slightly improved. No significant right effusion noted. Similar appearance of dense right upper lobe consolidation. Worsening right lower lobe heterogeneous airspace opacities. IMPRESSION: 1. Worsening right lower lobe heterogeneous airspace opacities. Stable appearance of right upper lobe consolidation. 2. Similar appearance of loculated left pleural effusion. Left lower lung aeration appears slightly improved. Electronically Signed   By: Albin Felling M.D.   On: 02/28/2021 08:42  DG Chest Port 1 View  Result Date: 03/17/2021 CLINICAL DATA:  76 year old male with shortness of breath. EXAM: PORTABLE CHEST 1 VIEW COMPARISON:  Portable chest 02/16/2021 and earlier. FINDINGS: Portable AP upright view at 0349 hours. Chronic loculated left pleural effusion with associated areas of left lung round atelectasis. Right upper lobe consolidation is new since 02/11/2021, but ongoing from the end of last month and has not significantly improved. Stable cardiac size and mediastinal contours. Stable trachea. No superimposed pneumothorax  or pulmonary edema. No acute osseous abnormality identified. IMPRESSION: 1. Right upper lobe consolidation first present on 02/13/2021 has not resolved. 2. Chronic loculated left pleural effusion with left lung atelectasis. 3. No new cardiopulmonary abnormality. Electronically Signed   By: Genevie Ann M.D.   On: 03/16/2021 04:04   DG CHEST PORT 1 VIEW  Result Date: 02/16/2021 CLINICAL DATA:  Acute on chronic respiratory failure EXAM: PORTABLE CHEST 1 VIEW COMPARISON:  02/13/2021 FINDINGS: Patchy bilateral airspace disease again noted, unchanged. Loculated left pleural effusion, stable. Heart is normal size. Aortic atherosclerosis. No acute bony abnormality. IMPRESSION: Stable patchy bilateral airspace disease and loculated left pleural effusion Electronically Signed   By: Rolm Baptise M.D.   On: 02/16/2021 05:14   DG CHEST PORT 1 VIEW  Result Date: 02/13/2021 CLINICAL DATA:  Shortness of breath, chest pain EXAM: PORTABLE CHEST 1 VIEW COMPARISON:  02/11/2021 FINDINGS: No significant change in AP portable chest radiograph with a moderate, loculated appearing left pleural effusion and a small right pleural effusion. Unchanged diffuse bilateral interstitial pulmonary opacity. Cardiomegaly. IMPRESSION: 1. No significant change in AP portable chest radiograph with a moderate, loculated appearing left pleural effusion and a small right pleural effusion. 2. Unchanged diffuse bilateral interstitial pulmonary opacity, consistent with edema or infection. 3.  Cardiomegaly. Electronically Signed   By: Delanna Ahmadi M.D.   On: 02/13/2021 08:52   DG Chest Port 1 View  Result Date: 02/11/2021 CLINICAL DATA:  Dyspnea, COPD, smoker EXAM: PORTABLE CHEST 1 VIEW COMPARISON:  01/28/2021 chest radiograph. FINDINGS: Stable cardiomediastinal silhouette with top normal heart size. No pneumothorax. Loculated small to moderate lateral basilar left pleural effusion/thickening, mildly increased. Slight blunting of the right  costophrenic angle. Cephalization of the pulmonary vasculature without overt pulmonary edema. Patchy and streaky opacities in the mid to lower left lung appear unchanged. IMPRESSION: 1. Loculated small to moderate lateral basilar left pleural effusion/thickening, mildly increased. 2. Chronic patchy and streaky opacities in the mid to lower left lung, favor nonspecific scarring. 3. Slight blunting of the right costophrenic angle, possible small right pleural effusion. Electronically Signed   By: Ilona Sorrel M.D.   On: 02/11/2021 11:26   ECHOCARDIOGRAM COMPLETE  Result Date: 03/04/2021    ECHOCARDIOGRAM REPORT   Patient Name:   JAEMON POLINSKY Date of Exam: 03/17/2021 Medical Rec #:  [id number]        Height:       67.0 in Accession #:    [id number]       Weight:       165.8 lb Date of Birth:  1945/05/06         BSA:          1.867 m Patient Age:    83 years         BP:           91/56 mmHg Patient Gender: M                HR:  126 bpm. Exam Location:  [facility] Procedure: 2D Echo, Cardiac Doppler and Color Doppler Indications:    CHF  History:        Patient has prior history of Echocardiogram examinations, most                 recent 04/02/2020. COPD and Stroke, Arrythmias:Atrial Flutter and                 Tachycardia, Signs/Symptoms:Chest Pain; Risk                 Factors:Hypertension, Dyslipidemia and Current Smoker.  Sonographer:    Wenda Low Referring Phys: [id number] [facility] [place]  1. The apex is akinetic. The mid to distal anterolateral, inferoseptal, anterior, anteroseptal, inferior walls appear hypokinetic. Pattern could be suggesting of a stress induced cardiomyopathy. . Left ventricular ejection fraction, by estimation, is 30  to 35%. The left ventricle has moderately decreased function. The left ventricle demonstrates regional wall motion abnormalities (see scoring diagram/findings for description). There is mild left ventricular hypertrophy. Left ventricular diastolic  parameters are indeterminate.  2. Right ventricular systolic function is normal. The right ventricular size is normal. There is mildly elevated pulmonary artery systolic pressure.  3. Left atrial size was mild to moderately dilated.  4. Right atrial size was mildly dilated.  5. The mitral valve is normal in structure. Trivial mitral valve regurgitation. No evidence of mitral stenosis.  6. The tricuspid valve is abnormal.  7. The aortic valve is tricuspid. There is mild calcification of the aortic valve. There is mild thickening of the aortic valve. Aortic valve regurgitation is not visualized. No aortic stenosis is present.  8. The inferior vena cava is normal in size with greater than 50% respiratory variability, suggesting right atrial pressure of 3 mmHg. FINDINGS  Left Ventricle: The apex is akinetic. The mid to distal anterolateral, inferoseptal, anterior, anteroseptal, inferior walls appear hypokinetic. Pattern could be suggesting of a stress induced cardiomyopathy. Left ventricular ejection fraction, by estimation, is 30 to 35%. The left ventricle has moderately decreased function. The left ventricle demonstrates regional wall motion abnormalities. Definity contrast agent was given IV to delineate the left ventricular endocardial borders. The left ventricular internal cavity size was normal in size. There is mild left ventricular hypertrophy. Left ventricular diastolic parameters are indeterminate. Right Ventricle: The right ventricular size is normal. No increase in right ventricular wall thickness. Right ventricular systolic function is normal. There is mildly elevated pulmonary artery systolic pressure. The tricuspid regurgitant velocity is 3.00  m/s, and with an assumed right atrial pressure of 8 mmHg, the estimated right ventricular systolic pressure is [id number] mmHg. Left Atrium: Left atrial size was mild to moderately dilated. Right Atrium: Right atrial size was mildly dilated. Pericardium: There is no  evidence of pericardial effusion. Mitral Valve: The mitral valve is normal in structure. There is mild thickening of the mitral valve leaflet(s). There is mild calcification of the mitral valve leaflet(s). Mild mitral annular calcification. Trivial mitral valve regurgitation. No evidence  of mitral valve stenosis. MV peak gradient, 9.2 mmHg. The mean mitral valve gradient is 3.0 mmHg. Tricuspid Valve: The tricuspid valve is abnormal. Tricuspid valve regurgitation is mild . No evidence of tricuspid stenosis. Aortic Valve: The aortic valve is tricuspid. There is mild calcification of the aortic valve. There is mild thickening of the aortic valve. There is mild aortic valve annular calcification. Aortic valve regurgitation is not visualized. No aortic stenosis  is present. Aortic valve mean  gradient measures 3.0 mmHg. Aortic valve peak gradient measures 5.4 mmHg. Aortic valve area, by VTI measures 2.62 cm. Pulmonic Valve: The pulmonic valve was not well visualized. Pulmonic valve regurgitation is not visualized. No evidence of pulmonic stenosis. Aorta: The aortic root is normal in size and structure. Venous: The inferior vena cava is normal in size with greater than 50% respiratory variability, suggesting right atrial pressure of 3 mmHg. IAS/Shunts: No atrial level shunt detected by color flow Doppler.  LEFT VENTRICLE PLAX 2D LVIDd:         5.00 cm   Diastology LVIDs:         3.10 cm   LV e' medial:    5.98 cm/s LV PW:         1.20 cm   LV E/e' medial:  26.9 LV IVS:        1.20 cm   LV e' lateral:   9.90 cm/s LVOT diam:     2.00 cm   LV E/e' lateral: 16.3 LV SV:         62 LV SV Index:   33 LVOT Area:     3.14 cm  RIGHT VENTRICLE RV Basal diam:  3.45 cm RV Mid diam:    3.10 cm RV S prime:     14.70 cm/s TAPSE (M-mode): 1.8 cm LEFT ATRIUM             Index        RIGHT ATRIUM           Index LA diam:        4.70 cm 2.52 cm/m   RA Area:     20.60 cm LA Vol (A2C):   81.5 ml 43.65 ml/m  RA Volume:   64.60 ml  34.60  ml/m LA Vol (A4C):   80.6 ml 43.16 ml/m LA Biplane Vol: 82.0 ml 43.91 ml/m  AORTIC VALVE                    PULMONIC VALVE AV Area (Vmax):    2.48 cm     PV Vmax:       0.69 m/s AV Area (Vmean):   2.41 cm     PV Peak grad:  1.9 mmHg AV Area (VTI):     2.62 cm AV Vmax:           116.00 cm/s AV Vmean:          81.700 cm/s AV VTI:            0.235 m AV Peak Grad:      5.4 mmHg AV Mean Grad:      3.0 mmHg LVOT Vmax:         91.60 cm/s LVOT Vmean:        62.700 cm/s LVOT VTI:          0.196 m LVOT/AV VTI ratio: 0.83  AORTA Ao Root diam: 3.10 cm Ao Asc diam:  3.00 cm MITRAL VALVE                TRICUSPID VALVE MV Area (PHT): 3.91 cm     TR Peak grad:   36.0 mmHg MV Area VTI:   1.89 cm     TR Vmax:        300.00 cm/s MV Peak grad:  9.2 mmHg MV Mean grad:  3.0 mmHg     SHUNTS MV Vmax:       1.52 m/s     Systemic VTI:  0.20 m MV Vmean:      66.6 cm/s    Systemic Diam: 2.00 cm MV Decel Time: 194 msec MV E velocity: 161.00 cm/s Carlyle Dolly MD Electronically signed by Carlyle Dolly MD Signature Date/Time: 03/25/2021/4:25:41 PM    Final    [place] EKG SITE RITE  Result Date: 02/28/2021 If Site Rite image not attached, placement could not be confirmed due to current cardiac rhythm.  [place] EKG SITE RITE  Result Date: 03/15/2021 If Site Rite image not attached, placement could not be confirmed due to current cardiac rhythm.  US THORACENTESIS ASP PLEURAL SPACE W/IMG GUIDE  Result Date: 02/13/2021 INDICATION: Moderate right pleural effusion Very small- left effusion EXAM: ULTRASOUND GUIDED right THORACENTESIS MEDICATIONS: 10 cc 1% lidocaine. COMPLICATIONS: None immediate. PROCEDURE: An ultrasound guided thoracentesis was thoroughly discussed with the patient and questions answered. The benefits, risks, alternatives and complications were also discussed. The patient understands and wishes to proceed with the procedure. Written consent was obtained. Ultrasound was performed to localize and mark an adequate pocket of  fluid in the right chest. The area was then prepped and draped in the normal sterile fashion. 1% Lidocaine was used for local anesthesia. Under ultrasound guidance a Yueh catheter was introduced. Thoracentesis was performed. The catheter was removed and a dressing applied. FINDINGS: A total of approximately 600 cc of yellow fluid was removed. Samples were sent to the laboratory as requested by the clinical team. IMPRESSION: Successful ultrasound guided right thoracentesis yielding 600 cc yellow fluid of pleural fluid. CXR: No ptx per Dr Thornton Papas Read by Lavonia Drafts [facility] Electronically Signed   By: Lavonia Dana M.D.   On: 02/13/2021 13:18    SIGNED: Deatra James, MD, FHM. [facility],  Pager (please use [email or web address] to page/text) Please use Epic Secure Chat for non-urgent communication (7AM-7PM)  > 77 minutes of critical care time was spent in stabilizing this patient, reviewing all medical record seen and examined the patient, discussing current plan of care with cardiology, patient and family  .    if 7PM-7AM, please contact night-coverage [email or web address], 03/01/2021, 11:17 AM

## 2021-03-02 ENCOUNTER — Inpatient Hospital Stay (HOSPITAL_COMMUNITY): Payer: PPO

## 2021-03-02 DIAGNOSIS — F17200 Nicotine dependence, unspecified, uncomplicated: Secondary | ICD-10-CM

## 2021-03-02 DIAGNOSIS — R0602 Shortness of breath: Secondary | ICD-10-CM

## 2021-03-02 DIAGNOSIS — J90 Pleural effusion, not elsewhere classified: Secondary | ICD-10-CM | POA: Diagnosis not present

## 2021-03-02 DIAGNOSIS — R778 Other specified abnormalities of plasma proteins: Secondary | ICD-10-CM | POA: Diagnosis not present

## 2021-03-02 DIAGNOSIS — J9621 Acute and chronic respiratory failure with hypoxia: Secondary | ICD-10-CM | POA: Diagnosis not present

## 2021-03-02 DIAGNOSIS — G928 Other toxic encephalopathy: Secondary | ICD-10-CM

## 2021-03-02 DIAGNOSIS — A419 Sepsis, unspecified organism: Secondary | ICD-10-CM | POA: Diagnosis not present

## 2021-03-02 LAB — COMPREHENSIVE METABOLIC PANEL
ALT: 75 U/L — ABNORMAL HIGH (ref 0–44)
AST: 64 U/L — ABNORMAL HIGH (ref 15–41)
Albumin: 3.5 g/dL (ref 3.5–5.0)
Alkaline Phosphatase: 49 U/L (ref 38–126)
Anion gap: 10 (ref 5–15)
BUN: 71 mg/dL — ABNORMAL HIGH (ref 8–23)
CO2: 34 mmol/L — ABNORMAL HIGH (ref 22–32)
Calcium: 8.5 mg/dL — ABNORMAL LOW (ref 8.9–10.3)
Chloride: 100 mmol/L (ref 98–111)
Creatinine, Ser: 2.4 mg/dL — ABNORMAL HIGH (ref 0.61–1.24)
GFR, Estimated: 28 mL/min — ABNORMAL LOW (ref >60)
Glucose, Bld: 132 mg/dL — ABNORMAL HIGH (ref 70–99)
Potassium: 4.7 mmol/L (ref 3.5–5.1)
Sodium: 144 mmol/L (ref 135–145)
Total Bilirubin: 0.4 mg/dL (ref 0.3–1.2)
Total Protein: 5.9 g/dL — ABNORMAL LOW (ref 6.5–8.1)

## 2021-03-02 LAB — CBC
HCT: 25.8 % — ABNORMAL LOW (ref 39.0–52.0)
Hemoglobin: 7.7 g/dL — ABNORMAL LOW (ref 13.0–17.0)
MCH: 29.5 pg (ref 26.0–34.0)
MCHC: 29.8 g/dL — ABNORMAL LOW (ref 30.0–36.0)
MCV: 98.9 fL (ref 80.0–100.0)
Platelets: 311 10*3/uL (ref 150–400)
RBC: 2.61 MIL/uL — ABNORMAL LOW (ref 4.22–5.81)
RDW: 16.8 % — ABNORMAL HIGH (ref 11.5–15.5)
WBC: 16.4 10*3/uL — ABNORMAL HIGH (ref 4.0–10.5)
nRBC: 0 % (ref 0.0–0.2)

## 2021-03-02 LAB — BRAIN NATRIURETIC PEPTIDE: B Natriuretic Peptide: 1350 pg/mL — ABNORMAL HIGH (ref 0.0–100.0)

## 2021-03-02 LAB — MRSA NEXT GEN BY PCR, NASAL: MRSA by PCR Next Gen: NOT DETECTED

## 2021-03-02 LAB — LACTIC ACID, PLASMA: Lactic Acid, Venous: 1.3 mmol/L (ref 0.5–1.9)

## 2021-03-02 LAB — MAGNESIUM: Magnesium: 2.7 mg/dL — ABNORMAL HIGH (ref 1.7–2.4)

## 2021-03-02 LAB — PHOSPHORUS: Phosphorus: 5.2 mg/dL — ABNORMAL HIGH (ref 2.5–4.6)

## 2021-03-02 MED ORDER — FUROSEMIDE 10 MG/ML IJ SOLN
60.0000 mg | Freq: Once | INTRAMUSCULAR | Status: AC
  Administered 2021-03-02: 60 mg via INTRAVENOUS
  Filled 2021-03-02: qty 6

## 2021-03-02 MED ORDER — DM-GUAIFENESIN ER 30-600 MG PO TB12
1.0000 | ORAL_TABLET | Freq: Two times a day (BID) | ORAL | Status: DC
  Administered 2021-03-02 (×2): 1 via ORAL
  Filled 2021-03-02 (×2): qty 1

## 2021-03-02 MED ORDER — MORPHINE SULFATE (PF) 2 MG/ML IV SOLN: 1.0000 mg | INTRAVENOUS | Status: DC | PRN

## 2021-03-02 NOTE — Progress Notes (Signed)
PROGRESS NOTE    Blake Cherry  [id number] DOB: Sep 29, 1945 DOA: 03/25/2021 PCP: Kathyrn Drown, MD   Brief Narrative:  Blake Cherry  is a 76 y.o. WM PMHx Abdominal aortic aneurysm, atrial fibrillation flutter, coronary artery disease, CKD, COPD, essential hypertension, stroke, SVT, and more   Presents to ED with chief complaint of dyspnea, cough, with new onset of lower extremity edema. Recent admission for pneumonia, recent evaluation for A. fib on Eliquis by cardiology. Recently started on Lasix by cardiology (approximately 6 days ago. Baseline O2 demand 4 L...    Subjective: Afebrile overnight, tachycardic, tachypneic.  A/O x4,    Assessment & Plan:  Covid vaccination;  Principal Problem:   Severe sepsis with septic shock ([place]) Active Problems:   Tobacco use disorder   Acute on chronic respiratory failure with hypoxia (HCC)   Pleural effusion   Mild protein-calorie malnutrition (HCC)   Elevated troponin   Sepsis (HCC)   Hypotensive episode   Toxic metabolic encephalopathy  Acute toxic metabolic encephalopathy  -But more awake, following some commands, still confused -Exacerbated by hypoxia, hypotension, respiratory metabolic acidosis -WBC [id number] >> 16.5 -Lactic acid 3.7 >> 0.7, Pro-Cal<0.10 -Initiating focus sepsis protocol-without aggressive IV fluid resuscitation (due to fluid retention, possible acute heart failure, elevated proBNP, volume overload) -Plan of care has been discussed with the patient wife and PCCM Dr. Halford Chessman There is an acute decline, anticipating poor prognosis possibly inhospital death -04-01-22 resolved   Sepsis/septic shock/ PNA -Evidenced by: Toxic metabolic encephalopathy, hypoxic, hypotensive, leukocytosis, tachycardic, tachypneic -Source of infection possibly pulmonary -PNA -Sepsis septic shock pathway -Continue: BiPAP, Levophed drip, IV antibiotics -Follow-up blood cultures -Discussed with pulmonary, PCCM, believe this is more  cardiogenic than sepsis Nevertheless continue to treat like sepsis and septic shock as patient meets the criteria -2022/04/01 discussed at length with patient's wife (patient has bilateral loculated effusion) -Continuing IV Solu-Cortef 100 mg TID due to low cortisol level (acute respiratory failure, COPD, septic shock, sepsis, hypotension)x 3 days.  Acute systolic congestive heart failure   -03/2020 echo LVEF [id number], indet diastolic fxn, normal RV function, mod LAE -Cardiology following -with lower extremity edema, elevated proBNP - Acute drop in ejection fraction 30-35% based on Echo -Elevated proBNP 788,, chest x-ray with no signs of congestion, reviewed repeat echo -Was treated aggressively IV Lasix 60 twice daily on 02/28/2021, acute incline in creatinine, therefore but holding Lasix today resume at lower dose tomorrow -Strict in and out -1.0 L - Daily weight Filed Weights   02/28/21 0500 03/01/21 0500 2021-04-01 0600  Weight: 72.8 kg 72.6 kg 70 kg  -1/8 Lasix IV 60 mg x 1 patient sounds wet.  Monitor closely BP and renal function.  Continue PRN Lasix.   History of A. fib with RVR, elevated troponin -A. fib in the setting of respiratory distress, and elevated troponin -Cardiology consulted, no ST elevation or depression, or significant changes EKG, rate 102, -Holding Cardizem -continuing Eliquis,  -Dronedarone ([place]aq)-DC'd by cardiology -Cardiology initiating 3 dose of IV digoxin 0.25 mg -Apr 01, 2022 currently sinus tachycardia  Hypotensive  -Seems to be chronic previously SBP were recorded at 90s -In the setting of IV Lasix, respite distress -With a history of underlying cardiovascular, dysrhythmia, avoiding inotropic's -We will initiate midodrine  Acute on chronic respiratory failure with hypoxia -Multifactorial with underlying COPD, possible PNA,  cardiogenic shock- new onset CHF with acute decline ejection fraction, exacerbated A. Fib,Acute on chronic pleural effusion -Respiratory failure,  with respiratory acidosis: -02/28/2021, ABG: On 5 L 7.1,  PCO2 103, PO2 190, bicarb 26.1, -RT consulted, patient has been placed on BiPAP  -PCCM, Dr. Halford Chessman consulted-recommended no aggressive measures continue current steroids DuoNeb BiPAP -Patient wife at bedside--agrees with current plan including the patient and his wife request of no aggressive treatment including intubation -Baseline O2 demand 4 L, -Elevated proBNP -DuoNeb bronchodilator treatment -Continue treating underlying causes -Status post 2 dose of IV Lasix -Flutter valve - Incentive spirometry - Mucinex BID - Respiratory virus panel pending - 1/8 morphine PRN SOB  RIGHT loculated pleural effusion -1/8 discussed case with Dr. Tamala Julian PCCM who reviewed films with me.  Believes there may be enough free-flowing fluid to perform thoracentesis on.  Consult IR for thoracentesis.  Thoracentesis pending for 1/9 - 1/8 Dr. Tamala Julian PCCM, concurs that if unable to drain fluid from RIGHT pleural effusion very poor candidate for any aggressive treatment such as chest tube or VATS.   Chronic Loculated pleural effusion -With history of prior thoracentesis, reattempting ultrasound-guided thoracentesis reported minimal to drain IV Lasix -holding today  COPD -Questionable exacerbation, status post 1 dose of IV steroids, continue DuoNeb bronchodilators, Pulmicort -Continue supplemental oxygen (baseline 4 L), BiPAP as required    Acute on chronic kidney disease stage IIIa(Baseline Cr 1.46) -Multifactorial hypotension, sepsis, septic shock, aggressive diuresing -Avoiding nephrotoxins -Treating underlying causes, including blood pressure sepsis but holding diuretics today Lab Results  Component Value Date   CREATININE 2.40 (H) 03/02/2021   CREATININE 2.39 (H) 03/01/2021   CREATININE 1.89 (H) 02/28/2021   CREATININE 1.83 (H) 03/06/2021   CREATININE 1.63 (H) 02/19/2021     Hyperlipidemia -Continue statin   History of chronic anemia -H&H  stable, continuing Eliquis -Outpatient follow-up with gastroenterologist for further evaluation -Received 1 unit of PRBC on December 2022   -Goals of care - 1/8 palliative care: Per PCCM who appears to know patient well, concerned that with his combination of new onset systolic CHF, shock, and severe COPD he does not have the reserve to fight through this current hospitalization.  Evaluate for palliative care vs Hospice   DVT prophylaxis: Eliquis Code Status: DNR Family Communication: 1/8 wife at bedside for discussion of plan of care all questions answered.  Long discussion concerning new loculated right pleural effusion. Status is: Inpatient    Dispo: The patient is from: Home              Anticipated d/c is to:  Will await recommendations from palliative care              Anticipated d/c date is: > 3 days              Patient currently is not medically stable to d/c.      Consultants:  PCCM Cardiology    Procedures/Significant Events:  Spirometry 05/07/15 >> FEV1 1.06 (34%), FEV1% 47 PET scan 07/29/20 >> rounded ATX in [place]/LUL, emphysema, airway thickening with mucus plugging, Rt apical scarring Rt thoracentesis 02/13/21 >> 600 ml yellow fluid, glucose 121, protein < 3, cells 783 (47% neutrophils, 28% lymphocytes, 20% macrophages, 5% eosinophils), reactive mesothelial cells on cytology Echo 04/02/20 >> EF 60 to 65%, mod LA dilation CT angio chest 01/28/21 >> upper lobe predominate emphysema, mild bronchial wall thickening, stable Lt loculated effusion, cylindrical BTX at bases CT angio chest 02/11/21 >> increased airway thickening and impaction Echo 02/27/21 >> EF 30 to 35%, mild LVH, mild/mod LA dilation __________________________________________________________________________ Lt chest u/s 02/27/21 >> small, loculated effusion -1/05 Admit, cardiology consulted -1/06 agitated and treated with haldol;  developed hypercapnia and started on Bipap; DNR, no escalation of  care   I have personally reviewed and interpreted all radiology studies and my findings are as above.  VENTILATOR SETTINGS: BiPAP 1/8 Set rate 10 FiO2 40% SPO2 98% IPAP EPAP    Cultures -SARS-CoV-2, influenza A/B all negative  Antimicrobials: Anti-infectives (From admission, onward)    Start     Ordered Stop   03/01/21 2224  ceFEPIme (MAXIPIME) 2 g in sodium chloride 0.9 % 100 mL IVPB        03/01/21 0908     02/28/21 0830  ceFEPIme (MAXIPIME) 2 g in sodium chloride 0.9 % 100 mL IVPB  Status:  Discontinued        02/28/21 0758 03/01/21 0908   02/28/21 0815  ceFEPIme (MAXIPIME) 2 g in sodium chloride 0.9 % 100 mL IVPB  Status:  Discontinued        02/28/21 0724 02/28/21 0758   02/28/21 0800  azithromycin (ZITHROMAX) 500 mg in sodium chloride 0.9 % 250 mL IVPB        02/28/21 0724 03/05/21 0759         Devices    LINES / TUBES:      Continuous Infusions:  amiodarone 30 mg/hr (03/02/21 0350)   azithromycin 500 mg (03/02/21 0746)   ceFEPime (MAXIPIME) IV Stopped (03/02/21 0716)   norepinephrine (LEVOPHED) Adult infusion Stopped (02/28/21 2100)     Objective: Vitals:   03/02/21 0645 03/02/21 0700 03/02/21 0715 03/02/21 0800  BP:  122/68  130/76  Pulse: (!) 135 (!) 133 (!) 132 (!) 133  Resp: 13 (!) 22 20 (!) 24  Temp:      TempSrc:      SpO2: 100% 100% 100% 98%  Weight:      Height:        Intake/Output Summary (Last 24 hours) at 03/02/2021 [id number] Last data filed at 03/02/2021 0600 Gross per 24 hour  Intake 847.5 ml  Output 700 ml  Net 147.5 ml   Filed Weights   02/28/21 0500 03/01/21 0500 03/02/21 0600  Weight: 72.8 kg 72.6 kg 70 kg    Examination:  General: A/O x4, positive acute on chronic respiratory distress Eyes: negative scleral hemorrhage, negative anisocoria, negative icterus ENT: Negative Runny nose, negative gingival bleeding, Neck:  Negative scars, masses, torticollis, lymphadenopathy, JVD Lungs: decreased breath sounds bilateral  RIGHT>> LEFT bilaterally, positive expiratory wheezes, positive rhonchi Cardiovascular: Regular rate and rhythm without murmur gallop or rub normal S1 and S2 Abdomen: negative abdominal pain, nondistended, positive soft, bowel sounds, no rebound, no ascites, no appreciable mass Extremities: No significant cyanosis, clubbing, or edema bilateral lower extremities Skin: Negative rashes, lesions, ulcers Psychiatric:  Negative depression, negative anxiety, negative fatigue, negative mania  Central nervous system:  Cranial nerves II through XII intact, tongue/uvula midline, all extremities muscle strength 5/5, sensation intact throughout, negative dysarthria, negative expressive aphasia, negative receptive aphasia.  .     Data Reviewed: Care during the described time interval was provided by me .  I have reviewed this patient's available data, including medical history, events of note, physical examination, and all test results as part of my evaluation.   CBC: Recent Labs  Lab 03/24/2021 0340 02/28/21 0445 03/01/21 0402 03/02/21 0500  WBC 18.1* 26.5* 16.5* 16.4*  NEUTROABS 13.9*  --   --   --   HGB 8.5* 7.7* 7.4* 7.7*  HCT 28.9* 26.0* 26.3* 25.8*  MCV 99.3 98.5 99.6 98.9  PLT 474* 480* 331 311  Basic Metabolic Panel: Recent Labs  Lab 03/19/2021 0340 02/28/21 0445 03/01/21 0402  NA 139 140 140  K 4.3 5.0 4.7  CL 100 97* 96*  CO2 33* 33* 33*  GLUCOSE 98 134* 114*  BUN 28* 37* 56*  CREATININE 1.83* 1.89* 2.39*  CALCIUM 8.1* 8.6* 8.0*  MG  --  2.6*  --    GFR: Estimated Creatinine Clearance: 25.4 mL/min (A) (by C-G formula based on SCr of 2.39 mg/dL (H)). Liver Function Tests: Recent Labs  Lab 03/02/2021 0340 02/28/21 0445 03/01/21 0402  AST 24 32 184*  ALT 28 21 92*  ALKPHOS 67 57 50  BILITOT 0.3 0.5 0.4  PROT 6.2* 6.2* 6.0*  ALBUMIN 3.1* 3.8 3.6   No results for input(s): LIPASE, AMYLASE in the last 168 hours. No results for input(s): AMMONIA in the last 168  hours. Coagulation Profile: Recent Labs  Lab 02/28/21 0745  INR 1.4*   Cardiac Enzymes: No results for input(s): CKTOTAL, CKMB, CKMBINDEX, TROPONINI in the last 168 hours. BNP (last 3 results) No results for input(s): PROBNP in the last 8760 hours. HbA1C: No results for input(s): HGBA1C in the last 72 hours. CBG: No results for input(s): GLUCAP in the last 168 hours. Lipid Profile: No results for input(s): CHOL, HDL, LDLCALC, TRIG, CHOLHDL, LDLDIRECT in the last 72 hours. Thyroid Function Tests: No results for input(s): TSH, T4TOTAL, FREET4, T3FREE, THYROIDAB in the last 72 hours. Anemia Panel: No results for input(s): VITAMINB12, FOLATE, FERRITIN, TIBC, IRON, RETICCTPCT in the last 72 hours. Urine analysis:    Component Value Date/Time   COLORURINE YELLOW 02/11/2021 1225   APPEARANCEUR CLEAR 02/11/2021 1225   LABSPEC 1.020 02/11/2021 1225   PHURINE 5.5 02/11/2021 1225   GLUCOSEU NEGATIVE 02/11/2021 1225   HGBUR NEGATIVE 02/11/2021 [place] 02/11/2021 1225   KETONESUR NEGATIVE 02/11/2021 1225   PROTEINUR NEGATIVE 02/11/2021 1225   UROBILINOGEN 0.2 08/16/2012 1545   NITRITE NEGATIVE 02/11/2021 1225   LEUKOCYTESUR NEGATIVE 02/11/2021 1225   Sepsis Labs: @LABRCNTIP (procalcitonin:4,lacticidven:4)  ) Recent Results (from the past 240 hour(s))  Resp Panel by RT-PCR (Flu A&B, Covid) Nasopharyngeal Swab     Status: None   Collection Time: 03/16/2021  3:40 AM   Specimen: Nasopharyngeal Swab; Nasopharyngeal(NP) swabs in vial transport medium  Result Value Ref Range Status   SARS Coronavirus 2 by RT PCR NEGATIVE NEGATIVE Final    Comment: (NOTE) SARS-CoV-2 target nucleic acids are NOT DETECTED.  The SARS-CoV-2 RNA is generally detectable in upper respiratory specimens during the acute phase of infection. The lowest concentration of SARS-CoV-2 viral copies this assay can detect is 138 copies/mL. A negative result does not preclude SARS-Cov-2 infection and  should not be used as the sole basis for treatment or other patient management decisions. A negative result may occur with  improper specimen collection/handling, submission of specimen other than nasopharyngeal swab, presence of viral mutation(s) within the areas targeted by this assay, and inadequate number of viral copies(<138 copies/mL). A negative result must be combined with clinical observations, patient history, and epidemiological information. The expected result is Negative.  Fact Sheet for Patients:  EntrepreneurPulse.com.au  Fact Sheet for Healthcare Providers:  IncredibleEmployment.be  This test is no t yet approved or cleared by the [place] FDA and  has been authorized for detection and/or diagnosis of SARS-CoV-2 by FDA under an Emergency Use Authorization (EUA). This EUA will remain  in effect (meaning this test can be used) for the duration of the COVID-19 declaration  under Section 564(b)(1) of the Act, 21 U.S.C.section 360bbb-3(b)(1), unless the authorization is terminated  or revoked sooner.       Influenza A by PCR NEGATIVE NEGATIVE Final   Influenza B by PCR NEGATIVE NEGATIVE Final    Comment: (NOTE) The Xpert Xpress SARS-CoV-2/FLU/RSV plus assay is intended as an aid in the diagnosis of influenza from Nasopharyngeal swab specimens and should not be used as a sole basis for treatment. Nasal washings and aspirates are unacceptable for Xpert Xpress SARS-CoV-2/FLU/RSV testing.  Fact Sheet for Patients: EntrepreneurPulse.com.au  Fact Sheet for Healthcare Providers: IncredibleEmployment.be  This test is not yet approved or cleared by the [place] FDA and has been authorized for detection and/or diagnosis of SARS-CoV-2 by FDA under an Emergency Use Authorization (EUA). This EUA will remain in effect (meaning this test can be used) for the duration of the COVID-19 declaration  under Section 564(b)(1) of the Act, 21 U.S.C. section 360bbb-3(b)(1), unless the authorization is terminated or revoked.  Performed at [facility], [address], [place], [place] [address]   Culture, blood (x 2)     Status: None (Preliminary result)   Collection Time: 02/28/21  7:45 AM   Specimen: Right Antecubital; Blood  Result Value Ref Range Status   Specimen Description RIGHT ANTECUBITAL  Final   Special Requests   Final    BOTTLES DRAWN AEROBIC AND ANAEROBIC Blood Culture adequate volume   Culture   Final    NO GROWTH < 24 HOURS Performed at [facility], [address], [place], [place] [address]    Report Status PENDING  Incomplete  Culture, blood (x 2)     Status: None (Preliminary result)   Collection Time: 02/28/21  7:45 AM   Specimen: BLOOD RIGHT HAND  Result Value Ref Range Status   Specimen Description BLOOD RIGHT HAND  Final   Special Requests   Final    BOTTLES DRAWN AEROBIC AND ANAEROBIC Blood Culture results may not be optimal due to an excessive volume of blood received in culture bottles   Culture   Final    NO GROWTH < 24 HOURS Performed at [facility], [address], [place], [place] [address]    Report Status PENDING  Incomplete         Radiology Studies: No results found.      Scheduled Meds:  apixaban  5 mg Oral BID   budesonide  0.5 mg Nebulization BID   chlorhexidine  15 mL Mouth Rinse BID   Chlorhexidine Gluconate Cloth  6 each Topical Daily   furosemide  20 mg Intravenous Q12H   hydrocortisone sod succinate (SOLU-CORTEF) inj  100 mg Intravenous Q8H   ipratropium  0.5 mg Nebulization TID   levalbuterol  1.25 mg Nebulization TID   mouth rinse  15 mL Mouth Rinse q12n4p   rosuvastatin  5 mg Oral Daily   sodium chloride flush  10-40 mL Intracatheter Q12H   Continuous Infusions:  amiodarone 30 mg/hr (03/02/21 0350)   azithromycin 500 mg (03/02/21 0746)   ceFEPime (MAXIPIME) IV Stopped (03/02/21 0716)   norepinephrine (LEVOPHED)  Adult infusion Stopped (02/28/21 2100)     LOS: 3 days   The patient is critically ill with multiple organ systems failure and requires high complexity decision making for assessment and support, frequent evaluation and titration of therapies, application of advanced monitoring technologies and extensive interpretation of multiple databases. Critical Care Time devoted to patient care services described in this note  Time spent: 40 minutes  Lenola Lockner, Geraldo Docker, MD [facility]   If 7PM-7AM, please contact night-coverage 03/02/2021, 8:32 AM

## 2021-03-02 NOTE — Plan of Care (Signed)

## 2021-03-03 ENCOUNTER — Inpatient Hospital Stay (HOSPITAL_COMMUNITY): Payer: PPO

## 2021-03-03 ENCOUNTER — Encounter (HOSPITAL_COMMUNITY): Payer: Self-pay | Admitting: Family Medicine

## 2021-03-03 DIAGNOSIS — R064 Hyperventilation: Secondary | ICD-10-CM

## 2021-03-03 DIAGNOSIS — J9621 Acute and chronic respiratory failure with hypoxia: Secondary | ICD-10-CM | POA: Diagnosis not present

## 2021-03-03 DIAGNOSIS — R6521 Severe sepsis with septic shock: Secondary | ICD-10-CM | POA: Diagnosis not present

## 2021-03-03 DIAGNOSIS — A419 Sepsis, unspecified organism: Secondary | ICD-10-CM | POA: Diagnosis not present

## 2021-03-03 DIAGNOSIS — J441 Chronic obstructive pulmonary disease with (acute) exacerbation: Secondary | ICD-10-CM | POA: Diagnosis not present

## 2021-03-03 LAB — CBC WITH DIFFERENTIAL/PLATELET
Abs Immature Granulocytes: 0.1 10*3/uL — ABNORMAL HIGH (ref 0.00–0.07)
Basophils Absolute: 0 10*3/uL (ref 0.0–0.1)
Basophils Relative: 0 %
Eosinophils Absolute: 0 10*3/uL (ref 0.0–0.5)
Eosinophils Relative: 0 %
HCT: 27.3 % — ABNORMAL LOW (ref 39.0–52.0)
Hemoglobin: 7.8 g/dL — ABNORMAL LOW (ref 13.0–17.0)
Immature Granulocytes: 1 %
Lymphocytes Relative: 2 %
Lymphs Abs: 0.3 10*3/uL — ABNORMAL LOW (ref 0.7–4.0)
MCH: 29.2 pg (ref 26.0–34.0)
MCHC: 28.6 g/dL — ABNORMAL LOW (ref 30.0–36.0)
MCV: 102.2 fL — ABNORMAL HIGH (ref 80.0–100.0)
Monocytes Absolute: 1 10*3/uL (ref 0.1–1.0)
Monocytes Relative: 6 %
Neutro Abs: 14.8 10*3/uL — ABNORMAL HIGH (ref 1.7–7.7)
Neutrophils Relative %: 91 %
Platelets: 293 10*3/uL (ref 150–400)
RBC: 2.67 MIL/uL — ABNORMAL LOW (ref 4.22–5.81)
RDW: 16.5 % — ABNORMAL HIGH (ref 11.5–15.5)
WBC: 16.2 10*3/uL — ABNORMAL HIGH (ref 4.0–10.5)
nRBC: 0 % (ref 0.0–0.2)

## 2021-03-03 LAB — BLOOD GAS, ARTERIAL
Acid-Base Excess: 3.8 mmol/L — ABNORMAL HIGH (ref 0.0–2.0)
Bicarbonate: 26.3 mmol/L (ref 20.0–28.0)
Drawn by: 23430
FIO2: 36
O2 Saturation: 97.6 %
Patient temperature: 36.5
pCO2 arterial: 98.8 mmHg (ref 32.0–48.0)
pH, Arterial: 7.139 — CL (ref 7.350–7.450)
pO2, Arterial: 122 mmHg — ABNORMAL HIGH (ref 83.0–108.0)

## 2021-03-03 LAB — COMPREHENSIVE METABOLIC PANEL
ALT: 63 U/L — ABNORMAL HIGH (ref 0–44)
AST: 39 U/L (ref 15–41)
Albumin: 3.5 g/dL (ref 3.5–5.0)
Alkaline Phosphatase: 51 U/L (ref 38–126)
Anion gap: 10 (ref 5–15)
BUN: 89 mg/dL — ABNORMAL HIGH (ref 8–23)
CO2: 33 mmol/L — ABNORMAL HIGH (ref 22–32)
Calcium: 8.4 mg/dL — ABNORMAL LOW (ref 8.9–10.3)
Chloride: 100 mmol/L (ref 98–111)
Creatinine, Ser: 2.65 mg/dL — ABNORMAL HIGH (ref 0.61–1.24)
GFR, Estimated: 25 mL/min — ABNORMAL LOW (ref >60)
Glucose, Bld: 142 mg/dL — ABNORMAL HIGH (ref 70–99)
Potassium: 4.9 mmol/L (ref 3.5–5.1)
Sodium: 143 mmol/L (ref 135–145)
Total Bilirubin: 0.4 mg/dL (ref 0.3–1.2)
Total Protein: 6.1 g/dL — ABNORMAL LOW (ref 6.5–8.1)

## 2021-03-03 LAB — BRAIN NATRIURETIC PEPTIDE: B Natriuretic Peptide: 1658 pg/mL — ABNORMAL HIGH (ref 0.0–100.0)

## 2021-03-03 LAB — MAGNESIUM: Magnesium: 2.9 mg/dL — ABNORMAL HIGH (ref 1.7–2.4)

## 2021-03-03 LAB — PHOSPHORUS: Phosphorus: 7.7 mg/dL — ABNORMAL HIGH (ref 2.5–4.6)

## 2021-03-03 MED ORDER — ACETAMINOPHEN 650 MG RE SUPP: 650.0000 mg | Freq: Four times a day (QID) | RECTAL | Status: DC | PRN

## 2021-03-03 MED ORDER — HALOPERIDOL 0.5 MG PO TABS: 0.5000 mg | ORAL_TABLET | ORAL | Status: DC | PRN

## 2021-03-03 MED ORDER — LEVALBUTEROL HCL 1.25 MG/0.5ML IN NEBU: 1.2500 mg | INHALATION_SOLUTION | Freq: Three times a day (TID) | RESPIRATORY_TRACT | Status: DC | PRN

## 2021-03-03 MED ORDER — GLYCOPYRROLATE 0.2 MG/ML IJ SOLN: 0.2000 mg | INTRAMUSCULAR | Status: DC | PRN

## 2021-03-03 MED ORDER — HALOPERIDOL LACTATE 2 MG/ML PO CONC
0.5000 mg | ORAL | Status: DC | PRN
  Filled 2021-03-03: qty 0.3

## 2021-03-03 MED ORDER — DIPHENHYDRAMINE HCL 50 MG/ML IJ SOLN: 12.5000 mg | INTRAMUSCULAR | Status: DC | PRN

## 2021-03-03 MED ORDER — GLYCOPYRROLATE 1 MG PO TABS: 1.0000 mg | ORAL_TABLET | ORAL | Status: DC | PRN

## 2021-03-03 MED ORDER — ONDANSETRON 4 MG PO TBDP: 4.0000 mg | ORAL_TABLET | Freq: Four times a day (QID) | ORAL | Status: DC | PRN

## 2021-03-03 MED ORDER — HALOPERIDOL LACTATE 5 MG/ML IJ SOLN
0.5000 mg | INTRAMUSCULAR | Status: DC | PRN
  Administered 2021-03-03: 0.5 mg via INTRAVENOUS
  Filled 2021-03-03: qty 1

## 2021-03-03 MED ORDER — ONDANSETRON HCL 4 MG/2ML IJ SOLN: 4.0000 mg | Freq: Four times a day (QID) | INTRAMUSCULAR | Status: DC | PRN

## 2021-03-03 MED ORDER — POLYVINYL ALCOHOL 1.4 % OP SOLN: 1.0000 [drp] | Freq: Four times a day (QID) | OPHTHALMIC | Status: DC | PRN

## 2021-03-03 MED ORDER — GLYCOPYRROLATE 0.2 MG/ML IJ SOLN
0.2000 mg | INTRAMUSCULAR | Status: DC | PRN
  Administered 2021-03-03: 0.2 mg via INTRAVENOUS
  Filled 2021-03-03: qty 1

## 2021-03-03 MED ORDER — ACETAMINOPHEN 325 MG PO TABS: 650.0000 mg | ORAL_TABLET | Freq: Four times a day (QID) | ORAL | Status: DC | PRN

## 2021-03-03 MED ORDER — MORPHINE BOLUS VIA INFUSION
2.0000 mg | INTRAVENOUS | Status: DC | PRN
  Administered 2021-03-03 (×2): 2 mg via INTRAVENOUS
  Filled 2021-03-03: qty 2

## 2021-03-03 MED ORDER — MORPHINE 100MG IN NS 100ML (1MG/ML) PREMIX INFUSION
2.5000 mg/h | INTRAVENOUS | Status: DC
  Administered 2021-03-03: 2.5 mg/h via INTRAVENOUS
  Filled 2021-03-03: qty 100

## 2021-03-04 ENCOUNTER — Encounter (HOSPITAL_COMMUNITY): Payer: Self-pay | Admitting: Family Medicine

## 2021-03-04 DIAGNOSIS — R064 Hyperventilation: Secondary | ICD-10-CM

## 2021-03-04 DIAGNOSIS — J441 Chronic obstructive pulmonary disease with (acute) exacerbation: Secondary | ICD-10-CM

## 2021-03-05 ENCOUNTER — Ambulatory Visit: Payer: PPO | Admitting: Cardiology

## 2021-03-05 LAB — CULTURE, BLOOD (ROUTINE X 2)
Culture: NO GROWTH
Culture: NO GROWTH
Special Requests: ADEQUATE

## 2021-03-14 LAB — FUNGUS CULTURE WITH STAIN

## 2021-03-14 LAB — FUNGAL ORGANISM REFLEX

## 2021-03-14 LAB — FUNGUS CULTURE RESULT

## 2021-03-26 NOTE — Progress Notes (Signed)
Notified by patient's family that the patient is no longer breathing. Upon assessment, no respirations, no heart beat. Two nurses to verify death: Reather Converse, RN and Augustin Coupe, LPN. Time of death: 03-17-21 at 06/15/48. Dr. Clearence Ped notified. Family at bedside. Nursing supervisor and security notified.

## 2021-03-26 NOTE — Death Summary Note (Addendum)
DEATH SUMMARY   Patient Details  Name: Blake Cherry MRN: [id number] DOB: 02-Oct-1945  Admission/Discharge Information   Admit Date:  2021/03/06  Date of Death: Date of Death: Mar 10, 2021  Time of Death: Time of Death: 06/08/48  Length of Stay: 4  Referring Physician: Kathyrn Drown, MD   Reason(s) for Hospitalization  ADMISSION HPI:  Blake Cherry  is a 76 y.o. male, with history of abdominal aortic aneurysm, atrial fibrillation flutter, coronary artery disease, CKD, COPD, essential hypertension, stroke, SVT, and more presents to ED with chief complaint of dyspnea.  Patient reports he has had dyspnea all day on the day of presentation.  Its been worsening since the morning when it started.  He feels like he cannot get any air in.  It is worse with exertion.  4 L nasal cannula at home.  He is maintaining his oxygen saturations on 4 L nasal cannula here in the hospital.  He has had associated cough that is productive of yellow sputum.  He denies any fevers.  He has had associated leg swelling and orthopnea.  Leg swelling started when he was discharged from his last hospital stay.  He was started on Lasix 20 mg p.o. daily about 6 days ago.  He has been taking that but has not been helping with his swelling legs.  He reports good urine output.  He reports he is only drinking 4 glasses of water a day.  He is using his breathing treatments at home and they are helping with the wheezing and the symptoms.  Patient denies any chest pain.  Patient has no other complaints at this time.  Patient does not drink, does not use illicit drugs.  He is vaccinated for COVID.  Patient is DNR/DO NOT INTUBATE.  Diagnoses  Preliminary cause of death: Pneumonia with loculated pleural effusions Secondary Diagnoses (including complications and co-morbidities):  Principal Problem:   Severe sepsis with septic shock (HCC) Active Problems:   Tobacco use disorder   Acute on chronic respiratory failure with hypoxia (HCC)    Pleural effusion   Mild protein-calorie malnutrition (HCC)   Elevated troponin   Sepsis (HCC)   Hypotensive episode   Toxic metabolic encephalopathy    Atrial Flutter with RVR    Acute systolic heart failure with stress induced cardiomyopathy    NSTEMI     Chronic kidney disease     Pneumonia      Brief Hospital Course (including significant findings, care, treatment, and services provided and events leading to death)   76 y.o. WM PMHx Abdominal aortic aneurysm, atrial fibrillation flutter, coronary artery disease, CKD, COPD, essential hypertension, stroke, SVT, CKD, smoker.  Presented to ED with chief complaint of dyspnea, cough, with new onset of lower extremity edema.  Recent admission for pneumonia, recent evaluation for A. Flutter/afib on Eliquis by cardiology.  He has Loculated pleural effusions, developed NSTEMI worsening CKD, progressive respiratory distress and failure. Evaluated by pulmonology. He had progressive respiratory failure and decline and after family meeting with wife on 2021/03/10 her decision was to make him full comfort care.     Pertinent Labs and Studies  Significant Diagnostic Studies DG Chest 1 View  Result Date: 02/13/2021 CLINICAL DATA:  RIGHT pleural effusion post pneumothorax EXAM: CHEST  1 VIEW COMPARISON:  Earlier study of 02/13/2021 FINDINGS: Enlargement of cardiac silhouette with vascular congestion. Atherosclerotic calcification aorta. Loculated LEFT pleural effusion unchanged. Decreased RIGHT pleural effusion and basilar atelectasis. Persistent pulmonary infiltrates. RIGHT apical scarring. No definite pneumothorax or  acute osseous findings. IMPRESSION: No pneumothorax following RIGHT thoracentesis. Remainder of exam unchanged. Electronically Signed   By: Lavonia Dana M.D.   On: 02/13/2021 12:06   CT CHEST WO CONTRAST  Result Date: 03/02/2021 CLINICAL DATA:  Shortness of breath acute respiratory failure with hypoxia. EXAM: CT CHEST WITHOUT CONTRAST TECHNIQUE:  Multidetector CT imaging of the chest was performed following the standard protocol without IV contrast. COMPARISON:  Chest radiograph dated February 28, 2021 FINDINGS: Cardiovascular: Heart is normal in size. No pericardial effusion. Prominent coronary artery atherosclerotic calcifications. Aorta is normal in caliber with prominent atherosclerotic calcifications. Mediastinum/Nodes: No enlarged mediastinal or axillary lymph nodes. Thyroid gland, trachea, and esophagus demonstrate no significant findings. Lungs/Pleura: There are bilateral pleural effusions, right greater than the left. Atelectasis/opacities in the dependent portions of bilateral lungs. Loculated pleural effusion in the superolateral aspect of the left lung. There is also loculated pleural effusion in the right upper lobe with consolidation in the dependent portion of the right upper lobe concerning for pneumonia. Bronchial thickening prominent in bilateral lower lobes concerning for bronchopneumonia/bronchiolitis. Upper Abdomen: No acute abnormality. Musculoskeletal: No chest wall mass or suspicious bone lesions identified. IMPRESSION: 1. Moderate bilateral pleural effusions, right greater than the left. Loculated pleural effusion in the superolateral aspect of the right upper lobe with adjacent consolidation concerning for atelectasis or pneumonia. Another loculated pleural effusion in the anterolateral aspect of the left lung. These effusions likely represents sequela of recent infectious/inflammatory process. 2.  Emphysematous changes of the bilateral lung apices. 3. Bronchial thickening prominent in bilateral lower lobes concerning for bronchopneumonia/bronchiolitis. 4. Cardiomegaly with atherosclerotic disease. No pericardial effusion. Electronically Signed   By: Keane Police D.O.   On: 03/02/2021 10:51   CT Angio Chest Pulmonary Embolism (PE) W or WO Contrast  Result Date: 02/11/2021 CLINICAL DATA:  Dyspnea, hypoxia, COPD EXAM: CT  ANGIOGRAPHY CHEST WITH CONTRAST TECHNIQUE: Multidetector CT imaging of the chest was performed using the standard protocol during bolus administration of intravenous contrast. Multiplanar CT image reconstructions and MIPs were obtained to evaluate the vascular anatomy. CONTRAST:  37mL OMNIPAQUE IOHEXOL 350 MG/ML SOLN COMPARISON:  01/28/2021 FINDINGS: Cardiovascular: There is adequate opacification of the pulmonary arterial tree. No intraluminal filling defect identified to suggest acute pulmonary embolism. The central pulmonary arteries are mildly enlarged in keeping with changes of pulmonary arterial hypertension. Extensive multi-vessel coronary artery calcification. Global cardiac size is within normal limits. No pericardial effusion. Moderate atherosclerotic calcification within the thoracic aorta. Small superimposed atheromatous plaque within the descending thoracic aorta without ulceration. No aortic aneurysm. Mediastinum/Nodes: Shotty precarinal adenopathy is stable. No additional pathologic thoracic adenopathy is identified. Esophagus unremarkable. Thyroid unremarkable. Lungs/Pleura: Mild emphysema. Parenchymal scarring with rounded atelectasis noted within the basilar lingula and left-sided volume loss is again noted. Bibasilar cylindrical bronchiectasis again noted. Since the prior examination, there has developed scattered ground-glass infiltrate within the posterior segment of the right upper lobe and lateral segment of the right middle lobe. Additionally, there has developed progressive bronchial wall thickening and airway impaction. Together, the findings may reflect changes of superimposed acute inflammation or infection. Small right pleural effusion has developed. No pneumothorax. Upper Abdomen: No acute abnormality. Musculoskeletal: No chest wall abnormality. No acute or significant osseous findings. Review of the MIP images confirms the above findings. IMPRESSION: No pulmonary embolism. Interval  development of scattered parenchymal infiltrate, progressive bronchiolar inflammation, and scattered airway impaction suggesting a superimposed acute infectious or inflammatory process. Interval development of a small right pleural effusion. Mild emphysema. Extensive multi-vessel  coronary artery calcification. Morphologic changes in keeping with pulmonary arterial hypertension. Aortic Atherosclerosis (ICD10-I70.0) and Emphysema (ICD10-J43.9). Electronically Signed   By: Fidela Salisbury M.D.   On: 02/11/2021 19:42   [place] CHEST (PLEURAL EFFUSION)  Result Date: 03-15-2021 CLINICAL DATA:  Pleural effusions EXAM: CHEST ULTRASOUND COMPARISON:  CT chest 03/02/2021 FINDINGS: Small BILATERAL pleural effusions. LEFT pleural effusion appeared at least partially loculated by CT. IMPRESSION: Small BILATERAL pleural effusions. Electronically Signed   By: Lavonia Dana M.D.   On: 15-Mar-2021 10:55   [place] CHEST (PLEURAL EFFUSION)  Result Date: 02/25/2021 CLINICAL DATA:  Pleural effusion, assessment for thoracentesis EXAM: CHEST ULTRASOUND COMPARISON:  Chest radiograph 02/28/2020 FINDINGS: Small loculated LEFT pleural effusion identified. Volume of fluid is insufficient for thoracentesis. IMPRESSION: Small loculated LEFT pleural effusion, insufficient for thoracentesis. Electronically Signed   By: Lavonia Dana M.D.   On: 03/23/2021 10:38   DG CHEST PORT 1 VIEW  Result Date: 02/28/2021 CLINICAL DATA:  Shortness of breath EXAM: PORTABLE CHEST 1 VIEW COMPARISON:  Previous day FINDINGS: Stable appearance of the cardiomediastinal silhouette. Calcifications of the aortic arch. Similar loculated appearance of left pleural effusion. Left lower lung aeration appears slightly improved. No significant right effusion noted. Similar appearance of dense right upper lobe consolidation. Worsening right lower lobe heterogeneous airspace opacities. IMPRESSION: 1. Worsening right lower lobe heterogeneous airspace opacities. Stable appearance of right  upper lobe consolidation. 2. Similar appearance of loculated left pleural effusion. Left lower lung aeration appears slightly improved. Electronically Signed   By: Albin Felling M.D.   On: 02/28/2021 08:42   DG Chest Port 1 View  Result Date: 03/04/2021 CLINICAL DATA:  76 year old male with shortness of breath. EXAM: PORTABLE CHEST 1 VIEW COMPARISON:  Portable chest 02/16/2021 and earlier. FINDINGS: Portable AP upright view at 0349 hours. Chronic loculated left pleural effusion with associated areas of left lung round atelectasis. Right upper lobe consolidation is new since 02/11/2021, but ongoing from the end of last month and has not significantly improved. Stable cardiac size and mediastinal contours. Stable trachea. No superimposed pneumothorax or pulmonary edema. No acute osseous abnormality identified. IMPRESSION: 1. Right upper lobe consolidation first present on 02/13/2021 has not resolved. 2. Chronic loculated left pleural effusion with left lung atelectasis. 3. No new cardiopulmonary abnormality. Electronically Signed   By: Genevie Ann M.D.   On: 03/16/2021 04:04   DG CHEST PORT 1 VIEW  Result Date: 02/16/2021 CLINICAL DATA:  Acute on chronic respiratory failure EXAM: PORTABLE CHEST 1 VIEW COMPARISON:  02/13/2021 FINDINGS: Patchy bilateral airspace disease again noted, unchanged. Loculated left pleural effusion, stable. Heart is normal size. Aortic atherosclerosis. No acute bony abnormality. IMPRESSION: Stable patchy bilateral airspace disease and loculated left pleural effusion Electronically Signed   By: Rolm Baptise M.D.   On: 02/16/2021 05:14   DG CHEST PORT 1 VIEW  Result Date: 02/13/2021 CLINICAL DATA:  Shortness of breath, chest pain EXAM: PORTABLE CHEST 1 VIEW COMPARISON:  02/11/2021 FINDINGS: No significant change in AP portable chest radiograph with a moderate, loculated appearing left pleural effusion and a small right pleural effusion. Unchanged diffuse bilateral interstitial  pulmonary opacity. Cardiomegaly. IMPRESSION: 1. No significant change in AP portable chest radiograph with a moderate, loculated appearing left pleural effusion and a small right pleural effusion. 2. Unchanged diffuse bilateral interstitial pulmonary opacity, consistent with edema or infection. 3.  Cardiomegaly. Electronically Signed   By: Delanna Ahmadi M.D.   On: 02/13/2021 08:52   DG Chest Port 1 View  Result Date:  02/11/2021 CLINICAL DATA:  Dyspnea, COPD, smoker EXAM: PORTABLE CHEST 1 VIEW COMPARISON:  01/28/2021 chest radiograph. FINDINGS: Stable cardiomediastinal silhouette with top normal heart size. No pneumothorax. Loculated small to moderate lateral basilar left pleural effusion/thickening, mildly increased. Slight blunting of the right costophrenic angle. Cephalization of the pulmonary vasculature without overt pulmonary edema. Patchy and streaky opacities in the mid to lower left lung appear unchanged. IMPRESSION: 1. Loculated small to moderate lateral basilar left pleural effusion/thickening, mildly increased. 2. Chronic patchy and streaky opacities in the mid to lower left lung, favor nonspecific scarring. 3. Slight blunting of the right costophrenic angle, possible small right pleural effusion. Electronically Signed   By: Ilona Sorrel M.D.   On: 02/11/2021 11:26   ECHOCARDIOGRAM COMPLETE  Result Date: 03/20/2021    ECHOCARDIOGRAM REPORT   Patient Name:   Blake Cherry Date of Exam: 03/01/2021 Medical Rec #:  [id number]        Height:       67.0 in Accession #:    [id number]       Weight:       165.8 lb Date of Birth:  [birth year]         BSA:          1.867 m Patient Age:    75 years         BP:           91/56 mmHg Patient Gender: M                HR:           126 bpm. Exam Location:  [facility] Procedure: 2D Echo, Cardiac Doppler and Color Doppler Indications:    CHF  History:        Patient has prior history of Echocardiogram examinations, most                 recent 04/02/2020. COPD and  Stroke, Arrythmias:Atrial Flutter and                 Tachycardia, Signs/Symptoms:Chest Pain; Risk                 Factors:Hypertension, Dyslipidemia and Current Smoker.  Sonographer:    Wenda Low Referring Phys: [id number] ASIA B [place]  1. The apex is akinetic. The mid to distal anterolateral, inferoseptal, anterior, anteroseptal, inferior walls appear hypokinetic. Pattern could be suggesting of a stress induced cardiomyopathy. . Left ventricular ejection fraction, by estimation, is 30  to 35%. The left ventricle has moderately decreased function. The left ventricle demonstrates regional wall motion abnormalities (see scoring diagram/findings for description). There is mild left ventricular hypertrophy. Left ventricular diastolic parameters are indeterminate.  2. Right ventricular systolic function is normal. The right ventricular size is normal. There is mildly elevated pulmonary artery systolic pressure.  3. Left atrial size was mild to moderately dilated.  4. Right atrial size was mildly dilated.  5. The mitral valve is normal in structure. Trivial mitral valve regurgitation. No evidence of mitral stenosis.  6. The tricuspid valve is abnormal.  7. The aortic valve is tricuspid. There is mild calcification of the aortic valve. There is mild thickening of the aortic valve. Aortic valve regurgitation is not visualized. No aortic stenosis is present.  8. The inferior vena cava is normal in size with greater than 50% respiratory variability, suggesting right atrial pressure of 3 mmHg. FINDINGS  Left Ventricle: The apex is akinetic. The mid to distal anterolateral, inferoseptal, anterior, anteroseptal,  inferior walls appear hypokinetic. Pattern could be suggesting of a stress induced cardiomyopathy. Left ventricular ejection fraction, by estimation, is 30 to 35%. The left ventricle has moderately decreased function. The left ventricle demonstrates regional wall motion abnormalities. Definity  contrast agent was given IV to delineate the left ventricular endocardial borders. The left ventricular internal cavity size was normal in size. There is mild left ventricular hypertrophy. Left ventricular diastolic parameters are indeterminate. Right Ventricle: The right ventricular size is normal. No increase in right ventricular wall thickness. Right ventricular systolic function is normal. There is mildly elevated pulmonary artery systolic pressure. The tricuspid regurgitant velocity is 3.00  m/s, and with an assumed right atrial pressure of 8 mmHg, the estimated right ventricular systolic pressure is 123XX123 mmHg. Left Atrium: Left atrial size was mild to moderately dilated. Right Atrium: Right atrial size was mildly dilated. Pericardium: There is no evidence of pericardial effusion. Mitral Valve: The mitral valve is normal in structure. There is mild thickening of the mitral valve leaflet(s). There is mild calcification of the mitral valve leaflet(s). Mild mitral annular calcification. Trivial mitral valve regurgitation. No evidence  of mitral valve stenosis. MV peak gradient, 9.2 mmHg. The mean mitral valve gradient is 3.0 mmHg. Tricuspid Valve: The tricuspid valve is abnormal. Tricuspid valve regurgitation is mild . No evidence of tricuspid stenosis. Aortic Valve: The aortic valve is tricuspid. There is mild calcification of the aortic valve. There is mild thickening of the aortic valve. There is mild aortic valve annular calcification. Aortic valve regurgitation is not visualized. No aortic stenosis  is present. Aortic valve mean gradient measures 3.0 mmHg. Aortic valve peak gradient measures 5.4 mmHg. Aortic valve area, by VTI measures 2.62 cm. Pulmonic Valve: The pulmonic valve was not well visualized. Pulmonic valve regurgitation is not visualized. No evidence of pulmonic stenosis. Aorta: The aortic root is normal in size and structure. Venous: The inferior vena cava is normal in size with greater than 50%  respiratory variability, suggesting right atrial pressure of 3 mmHg. IAS/Shunts: No atrial level shunt detected by color flow Doppler.  LEFT VENTRICLE PLAX 2D LVIDd:         5.00 cm   Diastology LVIDs:         3.10 cm   LV e' medial:    5.98 cm/s LV PW:         1.20 cm   LV E/e' medial:  26.9 LV IVS:        1.20 cm   LV e' lateral:   9.90 cm/s LVOT diam:     2.00 cm   LV E/e' lateral: 16.3 LV SV:         62 LV SV Index:   33 LVOT Area:     3.14 cm  RIGHT VENTRICLE RV Basal diam:  3.45 cm RV Mid diam:    3.10 cm RV S prime:     14.70 cm/s TAPSE (M-mode): 1.8 cm LEFT ATRIUM             Index        RIGHT ATRIUM           Index LA diam:        4.70 cm 2.52 cm/m   RA Area:     20.60 cm LA Vol (A2C):   81.5 ml 43.65 ml/m  RA Volume:   64.60 ml  34.60 ml/m LA Vol (A4C):   80.6 ml 43.16 ml/m LA Biplane Vol: 82.0 ml 43.91 ml/m  AORTIC VALVE  PULMONIC VALVE AV Area (Vmax):    2.48 cm     PV Vmax:       0.69 m/s AV Area (Vmean):   2.41 cm     PV Peak grad:  1.9 mmHg AV Area (VTI):     2.62 cm AV Vmax:           116.00 cm/s AV Vmean:          81.700 cm/s AV VTI:            0.235 m AV Peak Grad:      5.4 mmHg AV Mean Grad:      3.0 mmHg LVOT Vmax:         91.60 cm/s LVOT Vmean:        62.700 cm/s LVOT VTI:          0.196 m LVOT/AV VTI ratio: 0.83  AORTA Ao Root diam: 3.10 cm Ao Asc diam:  3.00 cm MITRAL VALVE                TRICUSPID VALVE MV Area (PHT): 3.91 cm     TR Peak grad:   36.0 mmHg MV Area VTI:   1.89 cm     TR Vmax:        300.00 cm/s MV Peak grad:  9.2 mmHg MV Mean grad:  3.0 mmHg     SHUNTS MV Vmax:       1.52 m/s     Systemic VTI:  0.20 m MV Vmean:      66.6 cm/s    Systemic Diam: 2.00 cm MV Decel Time: 194 msec MV E velocity: 161.00 cm/s Carlyle Dolly MD Electronically signed by Carlyle Dolly MD Signature Date/Time: 03/14/2021/4:25:41 PM    Final    [place] EKG SITE RITE  Result Date: 02/28/2021 If Site Rite image not attached, placement could not be confirmed due to current cardiac  rhythm.  [place] EKG SITE RITE  Result Date: 03/10/2021 If Site Rite image not attached, placement could not be confirmed due to current cardiac rhythm.  US THORACENTESIS ASP PLEURAL SPACE W/IMG GUIDE  Result Date: 02/13/2021 INDICATION: Moderate right pleural effusion Very small- left effusion EXAM: ULTRASOUND GUIDED right THORACENTESIS MEDICATIONS: 10 cc 1% lidocaine. COMPLICATIONS: None immediate. PROCEDURE: An ultrasound guided thoracentesis was thoroughly discussed with the patient and questions answered. The benefits, risks, alternatives and complications were also discussed. The patient understands and wishes to proceed with the procedure. Written consent was obtained. Ultrasound was performed to localize and mark an adequate pocket of fluid in the right chest. The area was then prepped and draped in the normal sterile fashion. 1% Lidocaine was used for local anesthesia. Under ultrasound guidance a Yueh catheter was introduced. Thoracentesis was performed. The catheter was removed and a dressing applied. FINDINGS: A total of approximately 600 cc of yellow fluid was removed. Samples were sent to the laboratory as requested by the clinical team. IMPRESSION: Successful ultrasound guided right thoracentesis yielding 600 cc yellow fluid of pleural fluid. CXR: No ptx per Dr Thornton Papas Read by Lavonia Drafts [facility] Electronically Signed   By: Lavonia Dana M.D.   On: 02/13/2021 13:18    Microbiology Recent Results (from the past 240 hour(s))  Resp Panel by RT-PCR (Flu A&B, Covid) Nasopharyngeal Swab     Status: None   Collection Time: 02/26/2021  3:40 AM   Specimen: Nasopharyngeal Swab; Nasopharyngeal(NP) swabs in vial transport medium  Result Value Ref Range Status   SARS Coronavirus 2  by RT PCR NEGATIVE NEGATIVE Final    Comment: (NOTE) SARS-CoV-2 target nucleic acids are NOT DETECTED.  The SARS-CoV-2 RNA is generally detectable in upper respiratory specimens during the acute phase of infection. The  lowest concentration of SARS-CoV-2 viral copies this assay can detect is 138 copies/mL. A negative result does not preclude SARS-Cov-2 infection and should not be used as the sole basis for treatment or other patient management decisions. A negative result may occur with  improper specimen collection/handling, submission of specimen other than nasopharyngeal swab, presence of viral mutation(s) within the areas targeted by this assay, and inadequate number of viral copies(<138 copies/mL). A negative result must be combined with clinical observations, patient history, and epidemiological information. The expected result is Negative.  Fact Sheet for Patients:  EntrepreneurPulse.com.au  Fact Sheet for Healthcare Providers:  IncredibleEmployment.be  This test is no t yet approved or cleared by the [place] FDA and  has been authorized for detection and/or diagnosis of SARS-CoV-2 by FDA under an Emergency Use Authorization (EUA). This EUA will remain  in effect (meaning this test can be used) for the duration of the COVID-19 declaration under Section 564(b)(1) of the Act, 21 U.S.C.section 360bbb-3(b)(1), unless the authorization is terminated  or revoked sooner.       Influenza A by PCR NEGATIVE NEGATIVE Final   Influenza B by PCR NEGATIVE NEGATIVE Final    Comment: (NOTE) The Xpert Xpress SARS-CoV-2/FLU/RSV plus assay is intended as an aid in the diagnosis of influenza from Nasopharyngeal swab specimens and should not be used as a sole basis for treatment. Nasal washings and aspirates are unacceptable for Xpert Xpress SARS-CoV-2/FLU/RSV testing.  Fact Sheet for Patients: EntrepreneurPulse.com.au  Fact Sheet for Healthcare Providers: IncredibleEmployment.be  This test is not yet approved or cleared by the [place] FDA and has been authorized for detection and/or diagnosis of SARS-CoV-2 by FDA under  an Emergency Use Authorization (EUA). This EUA will remain in effect (meaning this test can be used) for the duration of the COVID-19 declaration under Section 564(b)(1) of the Act, 21 U.S.C. section 360bbb-3(b)(1), unless the authorization is terminated or revoked.  Performed at [facility], [address] [place], [place] [address]   Culture, blood (x 2)     Status: None (Preliminary result)   Collection Time: 02/28/21  7:45 AM   Specimen: Right Antecubital; Blood  Result Value Ref Range Status   Specimen Description RIGHT ANTECUBITAL  Final   Special Requests   Final    BOTTLES DRAWN AEROBIC AND ANAEROBIC Blood Culture adequate volume   Culture   Final    NO GROWTH 2 DAYS Performed at [facility], [address] [address] [place], [place] [address]    Report Status PENDING  Incomplete  Culture, blood (x 2)     Status: None (Preliminary result)   Collection Time: 02/28/21  7:45 AM   Specimen: BLOOD RIGHT HAND  Result Value Ref Range Status   Specimen Description BLOOD RIGHT HAND  Final   Special Requests   Final    BOTTLES DRAWN AEROBIC AND ANAEROBIC Blood Culture results may not be optimal due to an excessive volume of blood received in culture bottles   Culture   Final    NO GROWTH 2 DAYS Performed at [facility], [address], [place], [place] [address]    Report Status PENDING  Incomplete  MRSA Next Gen by PCR, Nasal     Status: None   Collection Time: 03/02/21  7:10 AM  Specimen: Nasal Mucosa; Nasal Swab  Result Value Ref Range Status   MRSA by PCR Next Gen NOT DETECTED NOT DETECTED Final    Comment: (NOTE) The GeneXpert MRSA Assay (FDA approved for NASAL specimens only), is one component of a comprehensive MRSA colonization surveillance program. It is not intended to diagnose MRSA infection nor to guide or monitor treatment for MRSA infections. Test performance is not FDA approved in patients less than 32 years old. Performed at [facility], [address]  [place], [place] [address]     Lab Basic Metabolic Panel: Recent Labs  Lab 02/24/2021 0340 02/28/21 0445 03/01/21 0402 03/02/21 0846 03-17-2021 0435  NA 139 140 140 144 143  K 4.3 5.0 4.7 4.7 4.9  CL 100 97* 96* 100 100  CO2 33* 33* 33* 34* 33*  GLUCOSE 98 134* 114* 132* 142*  BUN 28* 37* 56* 71* 89*  CREATININE 1.83* 1.89* 2.39* 2.40* 2.65*  CALCIUM 8.1* 8.6* 8.0* 8.5* 8.4*  MG  --  2.6*  --  2.7* 2.9*  PHOS  --   --   --  5.2* 7.7*   Liver Function Tests: Recent Labs  Lab 03/11/2021 0340 02/28/21 0445 03/01/21 0402 03/02/21 0846 2021-03-17 0435  AST 24 32 184* 64* 39  ALT 28 21 92* 75* 63*  ALKPHOS 67 57 50 49 51  BILITOT 0.3 0.5 0.4 0.4 0.4  PROT 6.2* 6.2* 6.0* 5.9* 6.1*  ALBUMIN 3.1* 3.8 3.6 3.5 3.5   No results for input(s): LIPASE, AMYLASE in the last 168 hours. No results for input(s): AMMONIA in the last 168 hours. CBC: Recent Labs  Lab 03/02/2021 0340 02/28/21 0445 03/01/21 0402 03/02/21 0500 03/17/2021 0435  WBC 18.1* 26.5* 16.5* 16.4* 16.2*  NEUTROABS 13.9*  --   --   --  14.8*  HGB 8.5* 7.7* 7.4* 7.7* 7.8*  HCT 28.9* 26.0* 26.3* 25.8* 27.3*  MCV 99.3 98.5 99.6 98.9 102.2*  PLT 474* 480* 331 311 293   Cardiac Enzymes: No results for input(s): CKTOTAL, CKMB, CKMBINDEX, TROPONINI in the last 168 hours. Sepsis Labs: Recent Labs  Lab 03/09/2021 0340 03/15/2021 1536 02/28/21 0445 02/28/21 0745 03/01/21 0402 03/02/21 0500 03/02/21 0846 03/17/2021 0435  PROCALCITON  --  <0.10  --  <0.10  --   --   --   --   WBC 18.1*  --  26.5*  --  16.5* 16.4*  --  16.2*  LATICACIDVEN 1.3 3.7* 0.7  --   --   --  1.3  --     Procedures/Operations    Terris Bodin 03/04/2021, 8:15 AM

## 2021-03-26 NOTE — Progress Notes (Signed)
PROGRESS NOTE   Harrell GaveWilliam D Sedore  ZOX:[id number] DOB: 06/25/1945 DOA: 03/21/2021 PCP: Babs SciaraLuking, Scott A, MD   Chief Complaint  Patient presents with   Shortness of Breath   Level of care: Med-Surg  Brief Admission History:  76 y.o. WM PMHx Abdominal aortic aneurysm, atrial fibrillation flutter, coronary artery disease, CKD, COPD, essential hypertension, stroke, SVT, and more   Presents to ED with chief complaint of dyspnea, cough, with new onset of lower extremity edema.  Recent admission for pneumonia, recent evaluation for A. fib on Eliquis by cardiology. Recently started on Lasix by cardiology (approximately 6 days ago.  Loculated pleural effusions. Evaluated by pulmonology. He had progressive respiratory failure and decline and after family meeting with wife on 03/18/2021 her decision was to make him full comfort care.    Assessment & Plan:   Principal Problem:   Severe sepsis with septic shock (HCC) Active Problems:   Tobacco use disorder   Acute on chronic respiratory failure with hypoxia (HCC)   Pleural effusion   Mild protein-calorie malnutrition (HCC)   Elevated troponin   Sepsis (HCC)   Hypotensive episode   Toxic metabolic encephalopathy  Family meeting with wife, her brother and chaplain at bedside.  Goals of care discussed and decision made by wife to pursue full comfort measures as patient was experiencing severe respiratory distress and had become bipap dependent.  Her wish was to focus on improving discomfort and respiratory distress.    Full comfort care orders initiated 02/25/2021.  Anticipating hospital death.    DVT prophylaxis: full comfort care  Code Status: DNR  Family Communication: wife  Disposition: anticipating hospital death  Status is: Inpatient  Remains inpatient appropriate because: IV infusions ordered    Consultants:  N/a  Procedures:  N/a  Antimicrobials:  N/a   Subjective: Pt was gasping for air and very somnolent earlier this morning.    Objective: Vitals:   20-Jan-2022 1000 20-Jan-2022 1100 20-Jan-2022 1200 20-Jan-2022 1229  BP: (!) 94/55 (!) 96/59    Pulse: (!) 121     Resp: 13 13 13 15   Temp:      TempSrc:      SpO2: 99%     Weight:      Height:        Intake/Output Summary (Last 24 hours) at 03/16/2021 1241 Last data filed at 02/28/2021 1206 Gross per 24 hour  Intake 883.98 ml  Output 1100 ml  Net -216.02 ml   Filed Weights   03/01/21 0500 03/02/21 0600 20-Jan-2022 0400  Weight: 72.6 kg 70 kg 70.2 kg    Examination:  General exam: appears critically ill on bipap, severe increased work of breathing Respiratory system: poor air movement diffuse rales heard bilaterally, crackles heard bilaterally.  Cardiovascular system: irregularly irregular uncontrolled on IV amiodarone Gastrointestinal system: Abdomen is nondistended, soft and nontender. No organomegaly or masses felt. Normal bowel sounds heard. Central nervous system: obtunded. Extremities: Symmetric 5 x 5 power. Skin: age related changes  Psychiatry: unable to determine.   Data Reviewed: I have personally reviewed following labs and imaging studies  CBC: Recent Labs  Lab 03/15/2021 0340 02/28/21 0445 03/01/21 0402 03/02/21 0500 20-Jan-2022 0435  WBC 18.1* 26.5* 16.5* 16.4* 16.2*  NEUTROABS 13.9*  --   --   --  14.8*  HGB 8.5* 7.7* 7.4* 7.7* 7.8*  HCT 28.9* 26.0* 26.3* 25.8* 27.3*  MCV 99.3 98.5 99.6 98.9 102.2*  PLT 474* 480* 331 311 293    Basic Metabolic Panel: Recent Labs  Lab 03/04/2021 0340 02/28/21 0445 03/01/21 0402 03/02/21 0846 2021/03/11 0435  NA 139 140 140 144 143  K 4.3 5.0 4.7 4.7 4.9  CL 100 97* 96* 100 100  CO2 33* 33* 33* 34* 33*  GLUCOSE 98 134* 114* 132* 142*  BUN 28* 37* 56* 71* 89*  CREATININE 1.83* 1.89* 2.39* 2.40* 2.65*  CALCIUM 8.1* 8.6* 8.0* 8.5* 8.4*  MG  --  2.6*  --  2.7* 2.9*  PHOS  --   --   --  5.2* 7.7*    GFR: Estimated Creatinine Clearance: 22.9 mL/min (A) (by C-G formula based on SCr of 2.65 mg/dL  (H)).  Liver Function Tests: Recent Labs  Lab 03/06/2021 0340 02/28/21 0445 03/01/21 0402 03/02/21 0846 03-11-2021 0435  AST 24 32 184* 64* 39  ALT 28 21 92* 75* 63*  ALKPHOS 67 57 50 49 51  BILITOT 0.3 0.5 0.4 0.4 0.4  PROT 6.2* 6.2* 6.0* 5.9* 6.1*  ALBUMIN 3.1* 3.8 3.6 3.5 3.5    CBG: No results for input(s): GLUCAP in the last 168 hours.  Recent Results (from the past 240 hour(s))  Resp Panel by RT-PCR (Flu A&B, Covid) Nasopharyngeal Swab     Status: None   Collection Time: 03/20/2021  3:40 AM   Specimen: Nasopharyngeal Swab; Nasopharyngeal(NP) swabs in vial transport medium  Result Value Ref Range Status   SARS Coronavirus 2 by RT PCR NEGATIVE NEGATIVE Final    Comment: (NOTE) SARS-CoV-2 target nucleic acids are NOT DETECTED.  The SARS-CoV-2 RNA is generally detectable in upper respiratory specimens during the acute phase of infection. The lowest concentration of SARS-CoV-2 viral copies this assay can detect is 138 copies/mL. A negative result does not preclude SARS-Cov-2 infection and should not be used as the sole basis for treatment or other patient management decisions. A negative result may occur with  improper specimen collection/handling, submission of specimen other than nasopharyngeal swab, presence of viral mutation(s) within the areas targeted by this assay, and inadequate number of viral copies(<138 copies/mL). A negative result must be combined with clinical observations, patient history, and epidemiological information. The expected result is Negative.  Fact Sheet for Patients:  BloggerCourse.com  Fact Sheet for Healthcare Providers:  SeriousBroker.it  This test is no t yet approved or cleared by the [place] FDA and  has been authorized for detection and/or diagnosis of SARS-CoV-2 by FDA under an Emergency Use Authorization (EUA). This EUA will remain  in effect (meaning this test can be used) for  the duration of the COVID-19 declaration under Section 564(b)(1) of the Act, 21 U.S.C.section 360bbb-3(b)(1), unless the authorization is terminated  or revoked sooner.       Influenza A by PCR NEGATIVE NEGATIVE Final   Influenza B by PCR NEGATIVE NEGATIVE Final    Comment: (NOTE) The Xpert Xpress SARS-CoV-2/FLU/RSV plus assay is intended as an aid in the diagnosis of influenza from Nasopharyngeal swab specimens and should not be used as a sole basis for treatment. Nasal washings and aspirates are unacceptable for Xpert Xpress SARS-CoV-2/FLU/RSV testing.  Fact Sheet for Patients: BloggerCourse.com  Fact Sheet for Healthcare Providers: SeriousBroker.it  This test is not yet approved or cleared by the [place] FDA and has been authorized for detection and/or diagnosis of SARS-CoV-2 by FDA under an Emergency Use Authorization (EUA). This EUA will remain in effect (meaning this test can be used) for the duration of the COVID-19 declaration under Section 564(b)(1) of the Act, 21 U.S.C. section 360bbb-3(b)(1), unless the  authorization is terminated or revoked.  Performed at [facility], [address] [place], [place] [address]   Culture, blood (x 2)     Status: None (Preliminary result)   Collection Time: 02/28/21  7:45 AM   Specimen: Right Antecubital; Blood  Result Value Ref Range Status   Specimen Description RIGHT ANTECUBITAL  Final   Special Requests   Final    BOTTLES DRAWN AEROBIC AND ANAEROBIC Blood Culture adequate volume   Culture   Final    NO GROWTH 2 DAYS Performed at [facility], [address] [place], [place] [address]    Report Status PENDING  Incomplete  Culture, blood (x 2)     Status: None (Preliminary result)   Collection Time: 02/28/21  7:45 AM   Specimen: BLOOD RIGHT HAND  Result Value Ref Range Status   Specimen Description BLOOD RIGHT HAND  Final   Special Requests   Final    BOTTLES DRAWN  AEROBIC AND ANAEROBIC Blood Culture results may not be optimal due to an excessive volume of blood received in culture bottles   Culture   Final    NO GROWTH 2 DAYS Performed at [facility], [address] [place], [place] [address]    Report Status PENDING  Incomplete  MRSA Next Gen by PCR, Nasal     Status: None   Collection Time: 03/02/21  7:10 AM   Specimen: Nasal Mucosa; Nasal Swab  Result Value Ref Range Status   MRSA by PCR Next Gen NOT DETECTED NOT DETECTED Final    Comment: (NOTE) The GeneXpert MRSA Assay (FDA approved for NASAL specimens only), is one component of a comprehensive MRSA colonization surveillance program. It is not intended to diagnose MRSA infection nor to guide or monitor treatment for MRSA infections. Test performance is not FDA approved in patients less than 75 years old. Performed at [facility], [address] [place], [place] [address]      Radiology Studies: CT CHEST WO CONTRAST  Result Date: 03/02/2021 CLINICAL DATA:  Shortness of breath acute respiratory failure with hypoxia. EXAM: CT CHEST WITHOUT CONTRAST TECHNIQUE: Multidetector CT imaging of the chest was performed following the standard protocol without IV contrast. COMPARISON:  Chest radiograph dated February 28, 2021 FINDINGS: Cardiovascular: Heart is normal in size. No pericardial effusion. Prominent coronary artery atherosclerotic calcifications. Aorta is normal in caliber with prominent atherosclerotic calcifications. Mediastinum/Nodes: No enlarged mediastinal or axillary lymph nodes. Thyroid gland, trachea, and esophagus demonstrate no significant findings. Lungs/Pleura: There are bilateral pleural effusions, right greater than the left. Atelectasis/opacities in the dependent portions of bilateral lungs. Loculated pleural effusion in the superolateral aspect of the left lung. There is also loculated pleural effusion in the right upper lobe with consolidation in the dependent portion of the right  upper lobe concerning for pneumonia. Bronchial thickening prominent in bilateral lower lobes concerning for bronchopneumonia/bronchiolitis. Upper Abdomen: No acute abnormality. Musculoskeletal: No chest wall mass or suspicious bone lesions identified. IMPRESSION: 1. Moderate bilateral pleural effusions, right greater than the left. Loculated pleural effusion in the superolateral aspect of the right upper lobe with adjacent consolidation concerning for atelectasis or pneumonia. Another loculated pleural effusion in the anterolateral aspect of the left lung. These effusions likely represents sequela of recent infectious/inflammatory process. 2.  Emphysematous changes of the bilateral lung apices. 3. Bronchial thickening prominent in bilateral lower lobes concerning for bronchopneumonia/bronchiolitis. 4. Cardiomegaly with atherosclerotic disease. No pericardial effusion. Electronically Signed   By: Larose HiresImran  Ahmed D.O.   On: 03/02/2021  10:51   [place] CHEST (PLEURAL EFFUSION)  Result Date: Mar 07, 2021 CLINICAL DATA:  Pleural effusions EXAM: CHEST ULTRASOUND COMPARISON:  CT chest 03/02/2021 FINDINGS: Small BILATERAL pleural effusions. LEFT pleural effusion appeared at least partially loculated by CT. IMPRESSION: Small BILATERAL pleural effusions. Electronically Signed   By: Ulyses Southward M.D.   On: 03/07/21 10:55    Scheduled Meds:  chlorhexidine  15 mL Mouth Rinse BID   mouth rinse  15 mL Mouth Rinse q12n4p   Continuous Infusions:  morphine 2.5 mg/hr (2021-03-07 1206)     LOS: 4 days   Time spent: 40 mins   Shanikka Wonders Laural Benes, MD How to contact the [facility] Attending or Consulting provider 7A - 7P or covering provider during after hours 7P -7A, for this patient?  Check the care team in [facility] and look for a) attending/consulting [facility] provider listed and b) the [facility] team listed Log into [email or web address] and use [place]'s universal password to access. If you do not have the password, please contact the hospital  operator. Locate the [facility] provider you are looking for under [facility] and page to a number that you can be directly reached. If you still have difficulty reaching the provider, please page the [facility] (Director on Call) for the Hospitalists listed on amion for assistance.  Mar 07, 2021, 12:41 PM

## 2021-03-26 NOTE — Progress Notes (Addendum)
Progress Note  Patient Name: Blake Cherry Date of Encounter: 13-Mar-2021  [facility] Cardiologist: Rozann Lesches, MD    Subjective   Wife at bedside, questions answered. Patient awake but not responding to questions.  Inpatient Medications    Scheduled Meds:  apixaban  5 mg Oral BID   budesonide  0.5 mg Nebulization BID   chlorhexidine  15 mL Mouth Rinse BID   Chlorhexidine Gluconate Cloth  6 each Topical Daily   dextromethorphan-guaiFENesin  1 tablet Oral BID   ipratropium  0.5 mg Nebulization TID   levalbuterol  1.25 mg Nebulization TID   mouth rinse  15 mL Mouth Rinse q12n4p   rosuvastatin  5 mg Oral Daily   sodium chloride flush  10-40 mL Intracatheter Q12H   Continuous Infusions:  amiodarone 30 mg/hr (13-Mar-2021 0726)   azithromycin 500 mg (03/13/21 0843)   ceFEPime (MAXIPIME) IV Stopped (03/13/21 0727)   norepinephrine (LEVOPHED) Adult infusion Stopped (02/28/21 2100)   PRN Meds: acetaminophen **OR** acetaminophen, haloperidol lactate, melatonin, morphine injection, ondansetron **OR** ondansetron (ZOFRAN) IV, oxyCODONE, sodium chloride flush   Vital Signs    Vitals:   Mar 13, 2021 0817 03/13/21 0831 13-Mar-2021 0832 13-Mar-2021 0834  BP:   138/78   Pulse:   (!) 122   Resp:   (!) 24   Temp: 97.8 F (36.6 C)     TempSrc: Axillary     SpO2:  97% 97% 96%  Weight:      Height:        Intake/Output Summary (Last 24 hours) at 13-Mar-2021 0844 Last data filed at 2021/03/13 0726 Gross per 24 hour  Intake 914.4 ml  Output 1100 ml  Net -185.6 ml   Last 3 Weights 13-Mar-2021 03/02/2021 03/01/2021  Weight (lbs) 154 lb 12.2 oz 154 lb 5.2 oz 160 lb 0.9 oz  Weight (kg) 70.2 kg 70 kg 72.6 kg      Telemetry    Aflutter 123/m - Personally Reviewed  ECG       Physical Exam    GEN: On BiPAP   Neck: No JVD Cardiac: RRR, 2/6 systolic murmur LSB Respiratory: decreased breath sounds with scattered crackles GI: Soft, nontender, non-distended  MS: No edema; No  deformity. Neuro:  not answering questions Psych: sedated  Labs    High Sensitivity Troponin:   Recent Labs  Lab 02/26/2021 0340 03/21/2021 0523  TROPONINIHS 132* 115*     Chemistry Recent Labs  Lab 02/28/21 0445 03/01/21 0402 03/02/21 0846 03/13/2021 0435  NA 140 140 144 143  K 5.0 4.7 4.7 4.9  CL 97* 96* 100 100  CO2 33* 33* 34* 33*  GLUCOSE 134* 114* 132* 142*  BUN 37* 56* 71* 89*  CREATININE 1.89* 2.39* 2.40* 2.65*  CALCIUM 8.6* 8.0* 8.5* 8.4*  MG 2.6*  --  2.7* 2.9*  PROT 6.2* 6.0* 5.9* 6.1*  ALBUMIN 3.8 3.6 3.5 3.5  AST 32 184* 64* 39  ALT 21 92* 75* 63*  ALKPHOS 57 50 49 51  BILITOT 0.5 0.4 0.4 0.4  GFRNONAA 37* 28* 28* 25*  ANIONGAP 10 11 10 10     Lipids No results for input(s): CHOL, TRIG, HDL, LABVLDL, LDLCALC, CHOLHDL in the last 168 hours.  Hematology Recent Labs  Lab 03/01/21 0402 03/02/21 0500 03/13/21 0435  WBC 16.5* 16.4* 16.2*  RBC 2.64* 2.61* 2.67*  HGB 7.4* 7.7* 7.8*  HCT 26.3* 25.8* 27.3*  MCV 99.6 98.9 102.2*  MCH 28.0 29.5 29.2  MCHC 28.1* 29.8* 28.6*  RDW  16.1* 16.8* 16.5*  PLT 331 311 293   Thyroid No results for input(s): TSH, FREET4 in the last 168 hours.  BNP Recent Labs  Lab 03/01/21 0402 03/02/21 0500 2021-03-28 0435  BNP 2,286.0* 1,350.0* 1,658.0*    DDimer No results for input(s): DDIMER in the last 168 hours.   Radiology    CT CHEST WO CONTRAST  Result Date: 03/02/2021 CLINICAL DATA:  Shortness of breath acute respiratory failure with hypoxia. EXAM: CT CHEST WITHOUT CONTRAST TECHNIQUE: Multidetector CT imaging of the chest was performed following the standard protocol without IV contrast. COMPARISON:  Chest radiograph dated February 28, 2021 FINDINGS: Cardiovascular: Heart is normal in size. No pericardial effusion. Prominent coronary artery atherosclerotic calcifications. Aorta is normal in caliber with prominent atherosclerotic calcifications. Mediastinum/Nodes: No enlarged mediastinal or axillary lymph nodes. Thyroid gland,  trachea, and esophagus demonstrate no significant findings. Lungs/Pleura: There are bilateral pleural effusions, right greater than the left. Atelectasis/opacities in the dependent portions of bilateral lungs. Loculated pleural effusion in the superolateral aspect of the left lung. There is also loculated pleural effusion in the right upper lobe with consolidation in the dependent portion of the right upper lobe concerning for pneumonia. Bronchial thickening prominent in bilateral lower lobes concerning for bronchopneumonia/bronchiolitis. Upper Abdomen: No acute abnormality. Musculoskeletal: No chest wall mass or suspicious bone lesions identified. IMPRESSION: 1. Moderate bilateral pleural effusions, right greater than the left. Loculated pleural effusion in the superolateral aspect of the right upper lobe with adjacent consolidation concerning for atelectasis or pneumonia. Another loculated pleural effusion in the anterolateral aspect of the left lung. These effusions likely represents sequela of recent infectious/inflammatory process. 2.  Emphysematous changes of the bilateral lung apices. 3. Bronchial thickening prominent in bilateral lower lobes concerning for bronchopneumonia/bronchiolitis. 4. Cardiomegaly with atherosclerotic disease. No pericardial effusion. Electronically Signed   By: Keane Police D.O.   On: 03/02/2021 10:51    Cardiac Studies    Echo 02/28/20 IMPRESSIONS     1. The apex is akinetic. The mid to distal anterolateral, inferoseptal,  anterior, anteroseptal, inferior walls appear hypokinetic. Pattern could  be suggesting of a stress induced cardiomyopathy. . Left ventricular  ejection fraction, by estimation, is 30   to 35%. The left ventricle has moderately decreased function. The left  ventricle demonstrates regional wall motion abnormalities (see scoring  diagram/findings for description). There is mild left ventricular  hypertrophy. Left ventricular diastolic  parameters are  indeterminate.   2. Right ventricular systolic function is normal. The right ventricular  size is normal. There is mildly elevated pulmonary artery systolic  pressure.   3. Left atrial size was mild to moderately dilated.   4. Right atrial size was mildly dilated.   5. The mitral valve is normal in structure. Trivial mitral valve  regurgitation. No evidence of mitral stenosis.   6. The tricuspid valve is abnormal.   7. The aortic valve is tricuspid. There is mild calcification of the  aortic valve. There is mild thickening of the aortic valve. Aortic valve  regurgitation is not visualized. No aortic stenosis is present.   8. The inferior vena cava is normal in size with greater than 50%  respiratory variability, suggesting right atrial pressure of 3 mmHg.   FINDINGS   Left Ventricle: The apex is akinetic. The mid to distal anterolateral,  inferoseptal, anterior, anteroseptal, inferior walls appear hypokinetic.  Pattern could be suggesting of a stress induced cardiomyopathy. Left  ventricular ejection fraction, by  estimation, is 30 to 35%. The left  ventricle has moderately decreased  function. The left ventricle demonstrates regional wall motion  abnormalities. Definity contrast agent was given IV to delineate the left  ventricular endocardial borders. The left  ventricular internal cavity size was normal in size. There is mild left  ventricular hypertrophy. Left ventricular diastolic parameters are  indeterminate.   Right Ventricle: The right ventricular size is normal. No increase in  right ventricular wall thickness. Right ventricular systolic function is  normal. There is mildly elevated pulmonary artery systolic pressure. The  tricuspid regurgitant velocity is 3.00   m/s, and with an assumed right atrial pressure of 8 mmHg, the estimated  right ventricular systolic pressure is 123XX123 mmHg.   Left Atrium: Left atrial size was mild to moderately dilated.   Right Atrium: Right  atrial size was mildly dilated.   Pericardium: There is no evidence of pericardial effusion.   Mitral Valve: The mitral valve is normal in structure. There is mild  thickening of the mitral valve leaflet(s). There is mild calcification of  the mitral valve leaflet(s). Mild mitral annular calcification. Trivial  mitral valve regurgitation. No evidence   of mitral valve stenosis. MV peak gradient, 9.2 mmHg. The mean mitral  valve gradient is 3.0 mmHg.   Tricuspid Valve: The tricuspid valve is abnormal. Tricuspid valve  regurgitation is mild . No evidence of tricuspid stenosis.   Aortic Valve: The aortic valve is tricuspid. There is mild calcification  of the aortic valve. There is mild thickening of the aortic valve. There  is mild aortic valve annular calcification. Aortic valve regurgitation is  not visualized. No aortic stenosis   is present. Aortic valve mean gradient measures 3.0 mmHg. Aortic valve  peak gradient measures 5.4 mmHg. Aortic valve area, by VTI measures 2.62  cm.   Pulmonic Valve: The pulmonic valve was not well visualized. Pulmonic valve  regurgitation is not visualized. No evidence of pulmonic stenosis.   Aorta: The aortic root is normal in size and structure.   Venous: The inferior vena cava is normal in size with greater than 50%  respiratory variability, suggesting right atrial pressure of 3 mmHg.   IAS/Shunts: No atrial level shunt detected by color flow Doppler.   Patient Profile     76 y.o. male with a hx of paroxysmal afib/aflutter who is being seen 03/20/2021 for the evaluation of tachycardia at the request of Dr Roger Shelter.   Assessment & Plan    Aflutter with RVR in setting of resp distress, new LV dysfunction, multaq stopped and now on amiodarone and dig load. HR still 120's. Rate control limited due to soft BP's. Continue Amio for now.  Acute systolic CHF with acute drop LVEF 30-35% WMA ? Stress induced CM-I/O's negative 1.6L  Acute respiratory  failure  due to pneumonia  Septic shock/pneumonia per primary team.now off levophed, midodrine. BP holding in the low 100's.  Elevated troponin-with new LVD and multiple WMA's, any ischemic testing on hold with current situation and now palliative care has been consulted.He's DNR  CKD Crt up to 2.65  Acute toxic metabolic encephalopathy per primary team     For questions or updates, please contact [place] Please consult [email or web address] for contact info under        Signed, Ermalinda Barrios, PA-C  2021/03/21, 8:44 AM     Pt seen.  Case reviewed. Agree with assessment as noted above by Gerrianne Scale.    Pt is now comfort care. Will sign off.  Dorris Carnes MD

## 2021-03-26 NOTE — Progress Notes (Signed)
Date and time results received: 03-30-21 0843 (use smartphrase ".now" to insert current time)  Test: ABG Critical Value: PH 7.139 and   PCO2 98.8  Name of Provider Notified: Dr Laural Benes and Respiratory  Orders Received? Or Actions Taken?:  No new orders currently, patient is currently on bipap

## 2021-03-26 NOTE — Progress Notes (Signed)
°  Transition of Care [facility]) Screening Note   Patient Details  Name: Blake Cherry Date of Birth: 03/23/1945   Transition of Care [facility]) CM/SW Contact:    Boneta Lucks, RN Phone Number: 03/29/21, 2:13 PM    Transition of Care Department [facility]) has reviewed patient and no TOC needs have been identified at this time. We will continue to monitor patient advancement through interdisciplinary progression rounds. If new patient transition needs arise, please place a TOC consult.

## 2021-03-26 NOTE — Progress Notes (Signed)
Pharmacy Antibiotic Note  Blake Cherry is a 76 y.o. male admitted on 03/02/2021 with pneumonia.  Pharmacy has been consulted for Cefepime dosing. PCT <0.1, AF. COPD. Patient WBC improving, but still decline overall. Scr increasing, lasix stopped.  Plan: Continue Cefepime 2gm IV q24h F/U cxs and clinical progress Monitor V/S, labs F/U LOT  Height: 5\' 7"  (170.2 cm) Weight: 70.2 kg (154 lb 12.2 oz) IBW/kg (Calculated) : 66.1  Temp (24hrs), Avg:97.1 F (36.2 C), Min:96.7 F (35.9 C), Max:97.4 F (36.3 C)  Recent Labs  Lab 03/02/2021 0340 03/20/2021 1536 02/28/21 0445 03/01/21 0402 03/02/21 0500 03/02/21 0846 03/05/21 0435  WBC 18.1*  --  26.5* 16.5* 16.4*  --  16.2*  CREATININE 1.83*  --  1.89* 2.39*  --  2.40* 2.65*  LATICACIDVEN 1.3 3.7* 0.7  --   --  1.3  --      Estimated Creatinine Clearance: 22.9 mL/min (A) (by C-G formula based on SCr of 2.65 mg/dL (H)).    No Known Allergies  Antimicrobials this admission: Cefepime  1/6 >>  azithromycin 1/6 >> x 5days  Microbiology results: 1/6 BCx: ngtd SARS-CoV-2, influenza A/B all negative  Thank you for allowing pharmacy to be a part of this patients care.  05/01/21, BS Pharm D, [facility] Clinical Pharmacist Pager [phone] March 05, 2021 7:42 AM

## 2021-03-26 NOTE — Progress Notes (Signed)
03-16-2021 11:09 AM  Family meeting with patient's wife and her brother and with chaplain.  We discussed patient's decline and increasing respiratory distress and no meaningful recovery despite maximal treatments.  Wife made decision to make him full comfort measures.  She did not want to see him suffer any longer and wishes for him to be pain free and free from respiratory distress.  We will start morphine infusion and liberate him from the bipap and other drips.  Anticipating hospital death.    Murvin Natal, MD  How to contact the [facility] Attending or Consulting provider [place] or covering provider during after hours [place], for this patient?  Check the care team in [facility] and look for a) attending/consulting TRH provider listed and b) the [facility] team listed Log into [email or web address] and use [place]'s universal password to access. If you do not have the password, please contact the hospital operator. Locate the [facility] provider you are looking for under [facility] and page to a number that you can be directly reached. If you still have difficulty reaching the provider, please page the [facility] (Director on Call) for the Hospitalists listed on amion for assistance.

## 2021-03-26 DEATH — deceased

## 2021-04-07 ENCOUNTER — Ambulatory Visit (INDEPENDENT_AMBULATORY_CARE_PROVIDER_SITE_OTHER): Payer: PPO | Admitting: Gastroenterology

## 2021-05-08 ENCOUNTER — Encounter (INDEPENDENT_AMBULATORY_CARE_PROVIDER_SITE_OTHER): Payer: PPO | Admitting: Ophthalmology

## 2021-05-26 ENCOUNTER — Ambulatory Visit: Payer: PPO | Admitting: Family Medicine

## 2021-07-14 IMAGING — CT CT CHEST LCS NODULE FOLLOW-UP W/O CM
1 of 2 series · 10 of 20 positions shown, 13 images · non-contrast
Comparison: Low-dose lung cancer screening chest CT 06/29/2019.

CLINICAL DATA: 74-year-old male current smoker with 59 pack-year
history of smoking. Lung cancer screening examination.

EXAM:
CT CHEST WITHOUT CONTRAST FOR LUNG CANCER SCREENING NODULE FOLLOW-UP
TECHNIQUE: Multidetector CT imaging of the chest was performed following the
standard protocol without IV contrast.

[ct lung segmentation data · axial · 0.68mm/px · z∈[+959,+959]mm · 10 of 325 frames shown]
[frame 1/325  mediastinal]
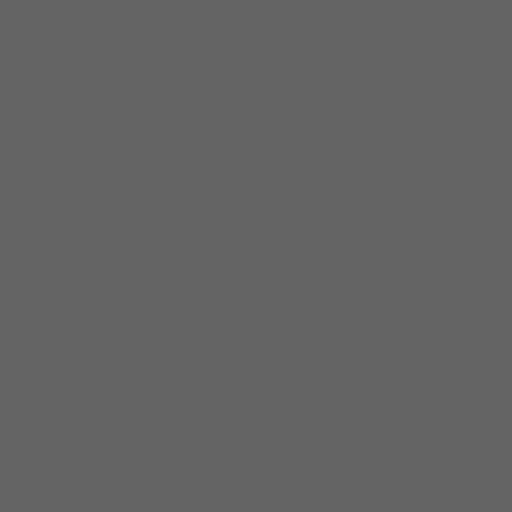
[frame 1/325  lung]
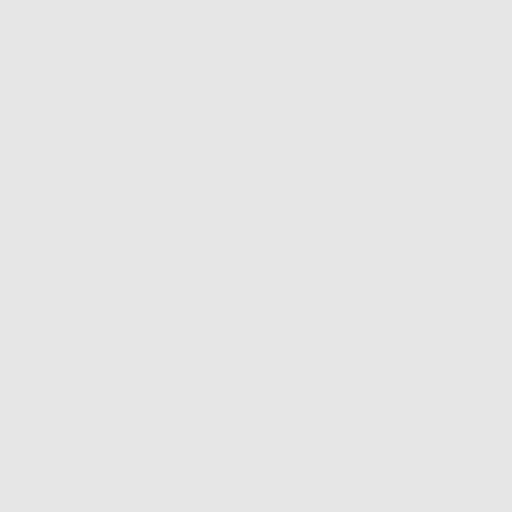
[frame 37/325  lung]
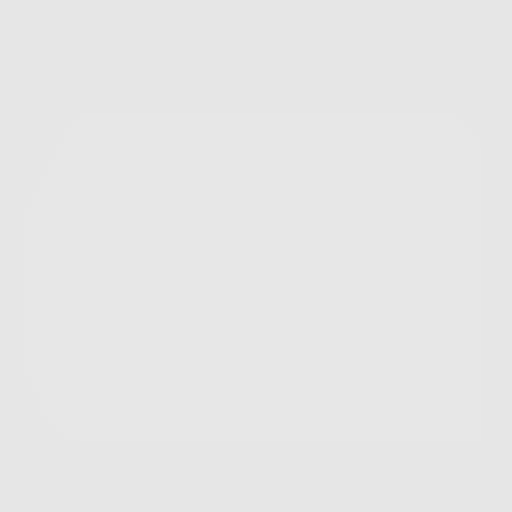
[frame 73/325  lung]
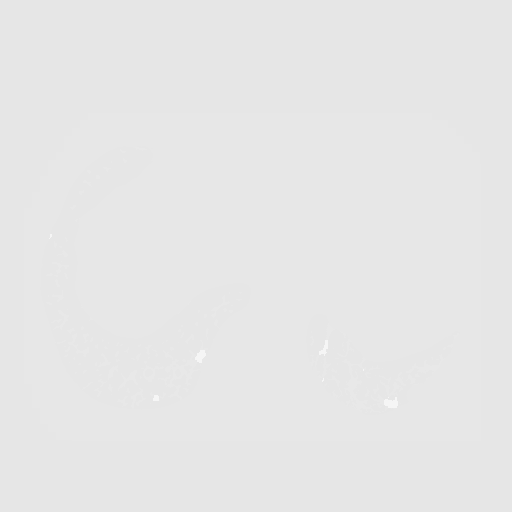
[frame 109/325  lung]
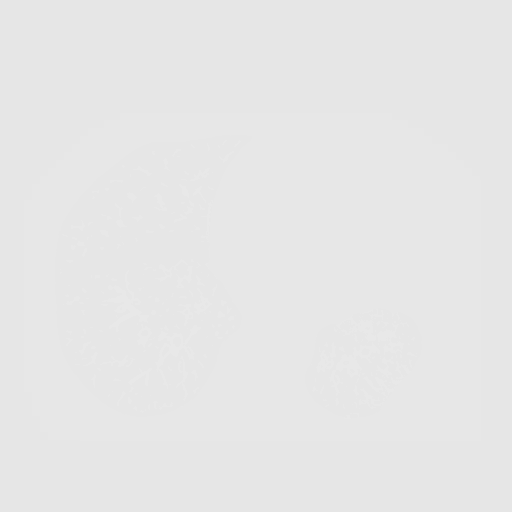
[frame 145/325  mediastinal]
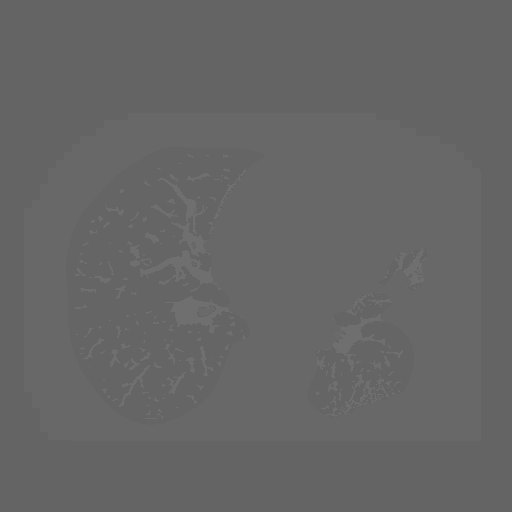
[frame 145/325  lung]
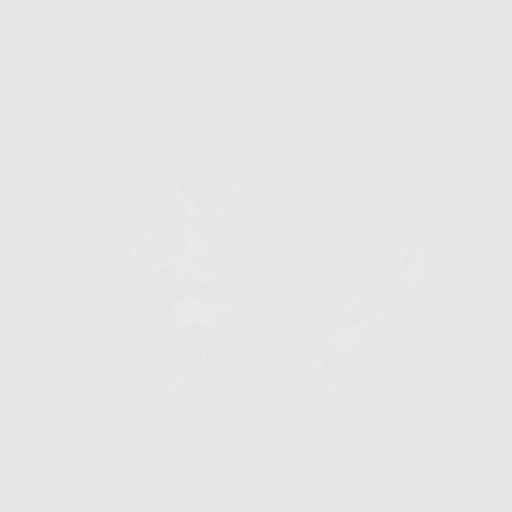
[frame 181/325  lung]
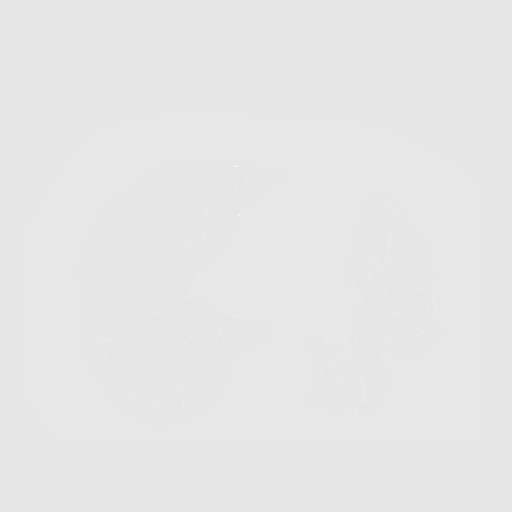
[frame 217/325  lung]
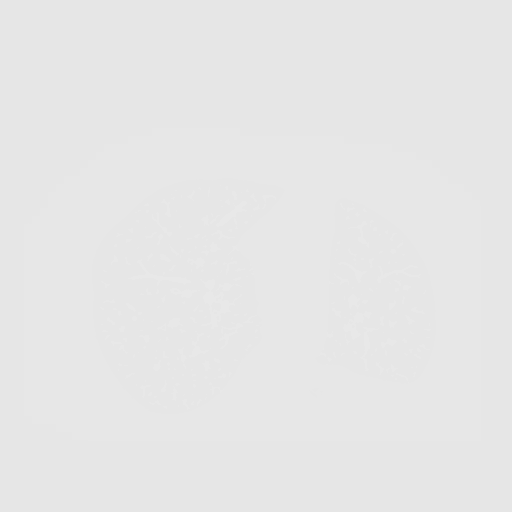
[frame 253/325  lung]
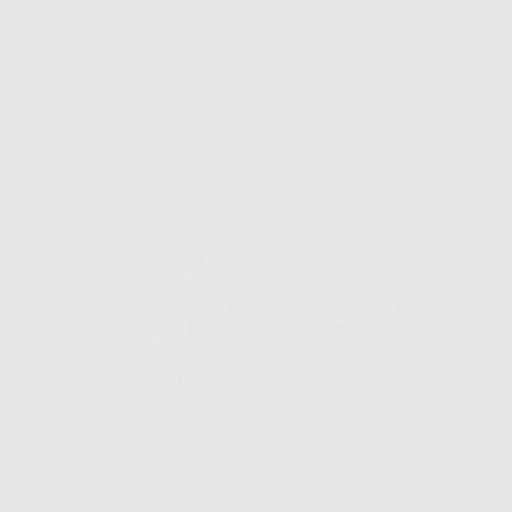
[frame 289/325  mediastinal]
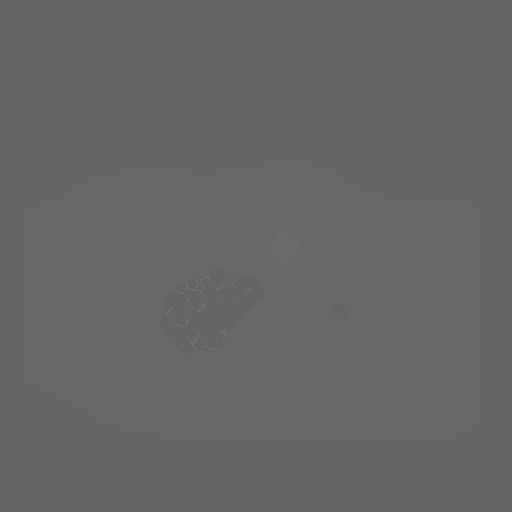
[frame 289/325  lung]
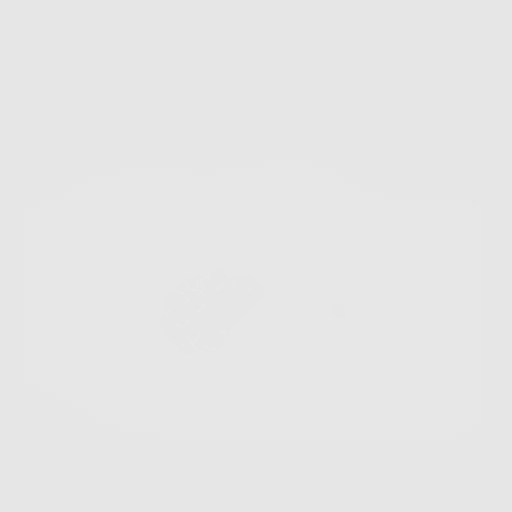
[frame 325/325  lung]
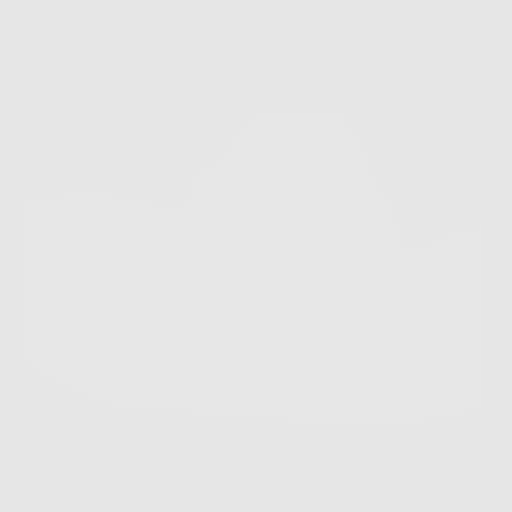

[10 of 20 positions shown; findings below may reference images not displayed]

FINDINGS: Cardiovascular: Heart size is normal. There is no significant
pericardial fluid, thickening or pericardial calcification. There is
aortic atherosclerosis, as well as atherosclerosis of the great
vessels of the mediastinum and the coronary arteries, including
calcified atherosclerotic plaque in the left main, left anterior
descending, left circumflex and right coronary arteries.
Calcifications of the mitral annulus.

Mediastinum/Nodes: No pathologically enlarged mediastinal or hilar
lymph nodes. Please note that accurate exclusion of hilar adenopathy
is limited on noncontrast CT scans. Esophagus is unremarkable in
appearance. No axillary lymphadenopathy.

Lungs/Pleura: Moderate left-sided pleural effusion which is
partially loculated, and associated with some peripheral areas of
pleuroparenchymal thickening and architectural distortion in the
left lung base, the appearance of which suggests developing regions
of rounded atelectasis. Areas of cylindrical bronchiectasis are
noted in the lung bases bilaterally, worsened compared to the prior
study, with some associated thickening of the peribronchovascular
interstitium and areas of apparent peribronchovascular
consolidation, most evident in the right lower lobe. Many of these
have a nodular appearance, with the largest of these demonstrating a
volume derived mean diameter of up to 14.2 mm in the left lower lobe
posteriorly (axial image 249 of series 3). Mild diffuse bronchial
wall thickening with mild centrilobular and paraseptal emphysema.

Upper Abdomen: Aortic atherosclerosis.

Musculoskeletal: There are no aggressive appearing lytic or blastic
lesions noted in the visualized portions of the skeleton.
IMPRESSION: 1. 1. Multiple new nodular areas in the lung bases bilaterally,
strongly favored to be of infectious or inflammatory etiology given
the overall appearance in the presence of bronchiectasis. However,
based on strict size criteria alone, this examination is technically
categorized as Lung-RADS 4BS, suspicious. Follow up low-dose chest
CT without contrast in 3 months (please use the following order, "CT
CHEST LCS NODULE FOLLOW-UP W/O CM") is recommended after trial of
antimicrobial therapy.
2. The "S" modifier above refers to potentially clinically
significant non lung cancer related findings. Specifically, there is
aortic atherosclerosis, in addition to left main and 3 vessel
coronary artery disease. Please note that although the presence of
coronary artery calcium documents the presence of coronary artery
disease, the severity of this disease and any potential stenosis
cannot be assessed on this non-gated CT examination. Assessment for
potential risk factor modification, dietary therapy or pharmacologic
therapy may be warranted, if clinically indicated.
3. Mild diffuse bronchial wall thickening with mild centrilobular
and paraseptal emphysema; imaging findings suggestive of underlying
COPD.
4. There are calcifications of the mitral annulus. Echocardiographic
correlation for evaluation of potential valvular dysfunction may be
warranted if clinically indicated.

Aortic Atherosclerosis (BU4ED-IOY.Y) and Emphysema (BU4ED-RM3.M).
